# Patient Record
Sex: Female | Born: 1935 | Race: Black or African American | Hispanic: No | Smoking: Former smoker
Health system: Southern US, Community
[De-identification: ages and names within clinical notes are randomized; demographics above are authoritative.]

## PROBLEM LIST (undated history)

## (undated) DIAGNOSIS — F32A Depression, unspecified: Secondary | ICD-10-CM

## (undated) DIAGNOSIS — H4920 Sixth [abducent] nerve palsy, unspecified eye: Secondary | ICD-10-CM

## (undated) DIAGNOSIS — I10 Essential (primary) hypertension: Secondary | ICD-10-CM

## (undated) DIAGNOSIS — G500 Trigeminal neuralgia: Secondary | ICD-10-CM

## (undated) DIAGNOSIS — E119 Type 2 diabetes mellitus without complications: Secondary | ICD-10-CM

## (undated) DIAGNOSIS — I639 Cerebral infarction, unspecified: Secondary | ICD-10-CM

## (undated) DIAGNOSIS — F29 Unspecified psychosis not due to a substance or known physiological condition: Secondary | ICD-10-CM

## (undated) DIAGNOSIS — F329 Major depressive disorder, single episode, unspecified: Secondary | ICD-10-CM

## (undated) DIAGNOSIS — D259 Leiomyoma of uterus, unspecified: Secondary | ICD-10-CM

## (undated) DIAGNOSIS — IMO0002 Reserved for concepts with insufficient information to code with codable children: Secondary | ICD-10-CM

## (undated) HISTORY — PX: TRACHEOSTOMY: SUR1362

## (undated) HISTORY — DX: Leiomyoma of uterus, unspecified: D25.9

## (undated) HISTORY — DX: Reserved for concepts with insufficient information to code with codable children: IMO0002

## (undated) HISTORY — DX: Major depressive disorder, single episode, unspecified: F32.9

## (undated) HISTORY — DX: Type 2 diabetes mellitus without complications: E11.9

## (undated) HISTORY — DX: Essential (primary) hypertension: I10

## (undated) HISTORY — DX: Trigeminal neuralgia: G50.0

## (undated) HISTORY — PX: BRONCHOSCOPY: SUR163

## (undated) HISTORY — DX: Unspecified psychosis not due to a substance or known physiological condition: F29

## (undated) HISTORY — DX: Depression, unspecified: F32.A

## (undated) HISTORY — DX: Sixth (abducent) nerve palsy, unspecified eye: H49.20

---

## 1997-12-29 ENCOUNTER — Encounter: Payer: Self-pay | Admitting: Emergency Medicine

## 1997-12-29 ENCOUNTER — Emergency Department (HOSPITAL_COMMUNITY): Admission: EM | Admit: 1997-12-29 | Discharge: 1997-12-29 | Payer: Self-pay | Admitting: Emergency Medicine

## 1998-01-04 ENCOUNTER — Emergency Department (HOSPITAL_COMMUNITY): Admission: EM | Admit: 1998-01-04 | Discharge: 1998-01-04 | Payer: Self-pay | Admitting: Emergency Medicine

## 1998-01-09 ENCOUNTER — Emergency Department (HOSPITAL_COMMUNITY): Admission: EM | Admit: 1998-01-09 | Discharge: 1998-01-09 | Payer: Self-pay | Admitting: Emergency Medicine

## 1998-01-09 ENCOUNTER — Encounter: Payer: Self-pay | Admitting: Emergency Medicine

## 1998-02-15 ENCOUNTER — Inpatient Hospital Stay (HOSPITAL_COMMUNITY): Admission: AD | Admit: 1998-02-15 | Discharge: 1998-02-17 | Payer: Self-pay | Admitting: Internal Medicine

## 1998-05-16 ENCOUNTER — Ambulatory Visit (HOSPITAL_COMMUNITY): Admission: RE | Admit: 1998-05-16 | Discharge: 1998-05-16 | Payer: Self-pay | Admitting: Internal Medicine

## 1998-07-26 ENCOUNTER — Emergency Department (HOSPITAL_COMMUNITY): Admission: EM | Admit: 1998-07-26 | Discharge: 1998-07-26 | Payer: Self-pay | Admitting: Emergency Medicine

## 1998-07-27 ENCOUNTER — Encounter: Payer: Self-pay | Admitting: Emergency Medicine

## 1998-07-28 ENCOUNTER — Emergency Department (HOSPITAL_COMMUNITY): Admission: EM | Admit: 1998-07-28 | Discharge: 1998-07-28 | Payer: Self-pay | Admitting: Emergency Medicine

## 1998-07-28 ENCOUNTER — Encounter: Payer: Self-pay | Admitting: Emergency Medicine

## 1998-07-29 ENCOUNTER — Emergency Department (HOSPITAL_COMMUNITY): Admission: EM | Admit: 1998-07-29 | Discharge: 1998-07-29 | Payer: Self-pay | Admitting: Emergency Medicine

## 1998-08-01 ENCOUNTER — Inpatient Hospital Stay (HOSPITAL_COMMUNITY): Admission: EM | Admit: 1998-08-01 | Discharge: 1998-08-12 | Payer: Self-pay | Admitting: Emergency Medicine

## 1998-08-02 ENCOUNTER — Encounter: Payer: Self-pay | Admitting: Psychiatry

## 1998-08-14 ENCOUNTER — Inpatient Hospital Stay (HOSPITAL_COMMUNITY): Admission: EM | Admit: 1998-08-14 | Discharge: 1998-09-03 | Payer: Self-pay | Admitting: Emergency Medicine

## 1998-08-15 ENCOUNTER — Other Ambulatory Visit: Admission: RE | Admit: 1998-08-15 | Discharge: 1998-08-15 | Payer: Self-pay | Admitting: *Deleted

## 1998-08-22 ENCOUNTER — Encounter: Payer: Self-pay | Admitting: *Deleted

## 2000-01-14 ENCOUNTER — Encounter: Admission: RE | Admit: 2000-01-14 | Discharge: 2000-01-14 | Payer: Self-pay | Admitting: Internal Medicine

## 2000-01-14 ENCOUNTER — Encounter: Payer: Self-pay | Admitting: Internal Medicine

## 2000-06-05 ENCOUNTER — Encounter: Payer: Self-pay | Admitting: Internal Medicine

## 2000-06-05 ENCOUNTER — Encounter (INDEPENDENT_AMBULATORY_CARE_PROVIDER_SITE_OTHER): Payer: Self-pay | Admitting: *Deleted

## 2000-06-05 ENCOUNTER — Inpatient Hospital Stay (HOSPITAL_COMMUNITY): Admission: AD | Admit: 2000-06-05 | Discharge: 2000-07-02 | Payer: Self-pay | Admitting: Internal Medicine

## 2000-06-06 ENCOUNTER — Encounter: Payer: Self-pay | Admitting: Geriatric Medicine

## 2000-06-07 ENCOUNTER — Encounter: Payer: Self-pay | Admitting: *Deleted

## 2000-06-08 ENCOUNTER — Encounter: Payer: Self-pay | Admitting: Internal Medicine

## 2000-06-09 ENCOUNTER — Encounter: Payer: Self-pay | Admitting: Pulmonary Disease

## 2000-06-09 ENCOUNTER — Encounter: Payer: Self-pay | Admitting: Internal Medicine

## 2000-06-10 ENCOUNTER — Encounter: Payer: Self-pay | Admitting: Internal Medicine

## 2000-06-11 ENCOUNTER — Encounter: Payer: Self-pay | Admitting: Internal Medicine

## 2000-06-12 ENCOUNTER — Encounter: Payer: Self-pay | Admitting: Internal Medicine

## 2000-06-13 ENCOUNTER — Encounter: Payer: Self-pay | Admitting: Internal Medicine

## 2000-06-14 ENCOUNTER — Encounter: Payer: Self-pay | Admitting: Pulmonary Disease

## 2000-06-14 ENCOUNTER — Encounter: Payer: Self-pay | Admitting: Internal Medicine

## 2000-06-15 ENCOUNTER — Encounter: Payer: Self-pay | Admitting: Internal Medicine

## 2000-06-16 ENCOUNTER — Encounter: Payer: Self-pay | Admitting: Internal Medicine

## 2000-06-17 ENCOUNTER — Encounter: Payer: Self-pay | Admitting: Internal Medicine

## 2000-06-22 ENCOUNTER — Encounter: Payer: Self-pay | Admitting: Internal Medicine

## 2000-06-24 ENCOUNTER — Encounter: Payer: Self-pay | Admitting: Internal Medicine

## 2000-06-25 ENCOUNTER — Encounter: Payer: Self-pay | Admitting: Internal Medicine

## 2000-06-26 ENCOUNTER — Encounter: Payer: Self-pay | Admitting: Internal Medicine

## 2000-06-28 ENCOUNTER — Encounter: Payer: Self-pay | Admitting: Internal Medicine

## 2000-06-29 ENCOUNTER — Encounter: Payer: Self-pay | Admitting: Critical Care Medicine

## 2000-06-30 ENCOUNTER — Encounter: Payer: Self-pay | Admitting: Internal Medicine

## 2000-07-02 ENCOUNTER — Inpatient Hospital Stay (HOSPITAL_COMMUNITY)
Admission: RE | Admit: 2000-07-02 | Discharge: 2000-07-04 | Payer: Self-pay | Admitting: Physical Medicine and Rehabilitation

## 2000-07-04 ENCOUNTER — Inpatient Hospital Stay (HOSPITAL_COMMUNITY): Admission: EM | Admit: 2000-07-04 | Discharge: 2000-07-14 | Payer: Self-pay | Admitting: Pulmonary Disease

## 2000-07-04 ENCOUNTER — Encounter: Payer: Self-pay | Admitting: Internal Medicine

## 2000-07-04 ENCOUNTER — Encounter: Payer: Self-pay | Admitting: Physical Medicine and Rehabilitation

## 2000-07-05 ENCOUNTER — Encounter: Payer: Self-pay | Admitting: Pulmonary Disease

## 2000-07-07 ENCOUNTER — Encounter: Payer: Self-pay | Admitting: Pulmonary Disease

## 2000-07-14 ENCOUNTER — Inpatient Hospital Stay (HOSPITAL_COMMUNITY)
Admission: RE | Admit: 2000-07-14 | Discharge: 2000-08-07 | Payer: Self-pay | Admitting: Physical Medicine & Rehabilitation

## 2000-10-06 ENCOUNTER — Encounter
Admission: RE | Admit: 2000-10-06 | Discharge: 2001-01-04 | Payer: Self-pay | Admitting: Physical Medicine & Rehabilitation

## 2001-01-05 ENCOUNTER — Encounter
Admission: RE | Admit: 2001-01-05 | Discharge: 2001-01-22 | Payer: Self-pay | Admitting: Physical Medicine & Rehabilitation

## 2001-01-20 ENCOUNTER — Encounter: Admission: RE | Admit: 2001-01-20 | Discharge: 2001-01-20 | Payer: Self-pay | Admitting: Internal Medicine

## 2001-01-20 ENCOUNTER — Encounter: Payer: Self-pay | Admitting: Internal Medicine

## 2001-04-20 ENCOUNTER — Encounter
Admission: RE | Admit: 2001-04-20 | Discharge: 2001-06-23 | Payer: Self-pay | Admitting: Physical Medicine & Rehabilitation

## 2002-02-08 ENCOUNTER — Encounter: Admission: RE | Admit: 2002-02-08 | Discharge: 2002-02-08 | Payer: Self-pay | Admitting: Internal Medicine

## 2002-02-08 ENCOUNTER — Encounter: Payer: Self-pay | Admitting: Internal Medicine

## 2005-07-30 ENCOUNTER — Encounter: Payer: Self-pay | Admitting: Internal Medicine

## 2005-08-27 ENCOUNTER — Emergency Department (HOSPITAL_COMMUNITY): Admission: EM | Admit: 2005-08-27 | Discharge: 2005-08-28 | Payer: Self-pay | Admitting: Emergency Medicine

## 2008-06-28 ENCOUNTER — Emergency Department (HOSPITAL_COMMUNITY): Admission: EM | Admit: 2008-06-28 | Discharge: 2008-06-28 | Payer: Self-pay | Admitting: Emergency Medicine

## 2010-08-16 NOTE — Consult Note (Signed)
[place]. [facility]  Patient:    Julia Floyd, Julia Floyd                  MRN: [id number] Proc. Date: 07/14/00 Adm. Date:  [id number] Attending:  Faith Rogue T                          Consultation Report  CHIEF COMPLAINT:  Tracheostomy.  HISTORY OF PRESENT ILLNESS:  A 77 year old black female undergoing sequelae after sustaining a spinal cord stroke.  She had a tracheostomy placed maybe greater than one month ago.  Approximately two weeks ago, I was called in for assessment to assist in decannulation.  At that time, she was having some bleeding from the tracheobronchial tree, which was felt to be secondary to suction trauma and resolved spontaneously.  Also at that time, the trachea was clear.  The larynx was functional with mobile vocal cords and good sensation. She had minimal swelling.  She was downsized to a #4 cuffless tracheostomy tube and after several days of observation with the tube plugged continuously and the patient talking, coughing, breathing, and eating funtionally and well, she was decannulated and an occlusive dressing was placed.  She did nicely. Several days thereafter, she apparently sustained some sort of a respiratory event, where she became hypoxic and respiratory therapy was able to reinsert a tracheostomy tube.  The exact nature of the event was uncertain. Subsequently, she has had a tracheostomy tube in place and doing well. Dr. Kevan Ny called me today to request reassistance with consideration for decannulation.  The patient has been moved to the rehab floor and is working on neurological and functional recovery.  She is working well with the tracheostomy tube with a Passy-Muir valve in place.  She is advancing her diet without evident aspiration.  PHYSICAL EXAMINATION:  GENERAL:  She is alert.  NECK:  She has a #4 cuffed tracheostomy tube in her neck with a clean stoma. She is using a Passy-Muir valve successfully.  The neck  is nonswollen.  There is a slight bit of crusting around the stoma underneath the flanges. Inserting the flexible scope through the tracheostomy tube, the tip is unoccluded, in good position, and the trachea down to the level of the carina is clear and patent.  I see no active bleeding at this time.  Following the use of 2% viscous Xylocaine in the nostrils, the flexible laryngoscope was introduced per the nose.  Nasopharynx clear.  Oropharynx clear.  Hypopharynx reveals mobile vocal cords.  No pooling in valleculae or piriforms.  No significant supraglottic, glottic, or subglottic edema.  I was able to visualize down through the cords to the level of the tracheal stoma.  No granulation tissue visible.  The cords were fully mobile.  Good sensation.  I was unable to pass the tracheostomy tube through the cords into the upper trachea.  Finally, removing the tracheostomy tube, the laryngoscope was inserted through the trachea stoma.  The wound is clean and healing.  The scope was retroflexed and the upper trachea was examined from below with a clear passage up to the level of the cords and looking the other way once again clear down to the level of the carina.  At this point, a #4 cuffless Shiley tracheostomy tube was placed without difficulty.  IMPRESSION:  Essentially normal larynx and trachea, both clean and patent.  No evidence of granulation or obstruction.  No evidence of aspiration.  She should have a functional cough, especially with a sealed tracheostomy tube.  I suspect that she will do well, depending on the rest of her functional status, with a gradual course towards downsizing, plugging, and then decannulation.  PLAN:  As above, changed her tube today.  Given that there was some uncertainty as to the exact nature of the prior respiratory distress, I think we should observe several days, probably until after the weekend, before considering decannulation.  We will put a plugged  cannula in now and have a talk with the nurses about leaving the plug in place, except as required for hygiene, pulmonary toilet, or for airway difficulty.  If she should have any of these latter two, this might suggest that she is not a candidate for decannulation but, if she is doing well with no requirements of pulmonary toilet or airway, then I suspect that we will decannulate her successfully. We discussed this with the patient, her husband, and the nurses. DD:  07/14/00 TD:  07/15/00 Job: [id number]

## 2010-08-16 NOTE — Discharge Summary (Signed)
[place]. [facility]  Patient:    Julia Floyd, Julia Floyd                  MRN: [id number] Adm. Date:  [id number] Disc. Date: [id number] Attending:  Faith Rogue T Dictator:   Junie Bame, P.A. CC:         Genene Churn. Love, M.D.  Pearla Dubonnet, M.D.   Discharge Summary  DISCHARGE DIAGNOSES: 1. Status post right brain cerebrovascular accident/cord infarct. 2. Hypertension. 3. Diabetes. 4. Neck pain/neck spasm. 5. Status post acute respiratory failure. 6. Status post tracheostomy. 7. Bipolar disorder. 8. Right sixth cranial nerve palsy. 9. Right trigeminal neuralgia.  HISTORY OF PRESENT ILLNESS:  Ms. Julia Floyd is a 77 year old, right-handed, African-American female with past medical history of hypertension and diabetes mellitus who was originally admitted to rehabilitation on July 02, 2000, due to a right CVA and C2, T2 cord infarct. Due to recurrent respiratory distress, the patient was transferred back to the pulmonary critical care unit on July 04, 2000.  The patient underwent repeat tracheostomy replacement due to upper airway edema and treatment of an E. coli UTI, diuresis with congestive heart failure and nutritional supplements.  At the present time, PT reports she can transfer 2+ total assistance.  Speech dysphagia III diet with nectar thick liquids with full supervision and no aphasia.  The patient presently has tracheostomy.  The patient also has a Panda tube placement scheduled for placement and it was discontinued.  The patient presently had right side paralysis.  ENT would decide whether appropriate to remain with tracheostomy who was Dr. Zoila Shutter.  Primary care Lafaye Mcelmurry is Dr. Marden Noble.  Neurologist is Dr. Sandria Manly.  Pulmonologist is Dr. Sharol Harness.  PAST MEDICAL HISTORY: 1. Bipolar disorder. 2. Hypertension. 3. Diabetes mellitus. 4. Dementia. 5. gastroparesis. 6. Right trigeminal neuralgia. 7. Right sixth cranial nerve  palsy.  SOCIAL HISTORY:  The patient lives with husband, daughter and granddaughter in [place] in a one-level home.  She is independent prior to admission.  She has no alcohol or tobacco abuse.  She has three adult children.  Husband can assist as needed.  ALLERGIES:  CODEINE, TEGRETOL and ULTRAM.  HOSPITAL COURSE:  [name] was admitted to rehabilitation on July 14, 2000, where she received more than three hours of physical therapy, occupational therapy and speech daily (comprehensive rehabilitation).  During the first week of hospital course, [name] had urinary retention, complained about elbow pain, neck spasms and pain and hip pain.  The patient was started on Zanaflex and she used a K-pad to left neck.  Ice pack t.i.d. to left hip as needed for pain.  Zanaflex doses were adjusted as needed during her hospital stay and the neck spasms and hip pain gradually ceased.  The patient was also started on Urecholine for urinary retention which improved and eventually was discontinued.  Ms. Burges was followed for respiratory distress by ENT during her first two weeks of rehabilitation for respiratory failure.  She was eventually decannulated on July 20, 2000, and did not develop any other symptoms of respiratory distress.  She was placed on albuterol p.r.n. as needed. Remaining hospital course was significant for vaginal bleed x 1 day and insomnia.  The patient was taking Lovenox with DVT prophylaxis during most of her stay in rehabilitation, but eventually vaginal bleeding did cease after one day and hemoglobin was within normal limits.  Blood glucose remained fairly stable during most of her stay.  She remained on Lasix  and Cozaar and was on insulin.  Latest labs indicated her hemoglobin was 9.3, hematocrit 28.3, white blood cell count 4.6.  BUN was 14, creatinine 0.7 and sodium 140. At the time of discharge, vital signs were stable.  She still had no range of motion  in the left upper extremity.  She still had significant weakness in the right lower extremity and left lower extremity.  She was moderate assist for bed mobility and maximum assist and was unable to ambulate.  The patient was discharged to home by ambulance.  DISCHARGE MEDICATIONS: 1. Plavix 75 mg one tablet daily. 2. Lasix 20 mg one tablet daily. 3. Neurontin 400 mg one tablet three times daily. 4. Cozaar 50 mg one tablet twice daily. 5. Protonix 40 mg one tablet daily. 6. Humulin N 24 units in the a.m., Humulin N 16 units in the p.m. 7. Zanaflex, follow instructions on prescription, 6 mg. 8. Multivitamin over-the-counter as needed. 9. Oxycodone 5 mg one tablet one tablet four to six hours as needed for pain.  DIET:  No concentrated sweets.  Check CBG before each meal and at night.  ACTIVITY:  She will receive [facility] for physical therapy, occupational therapy, nursing and aide with continued monitoring and education on diabetes.  FOLLOWUP:  She is to follow up with Dr. Riley Kill on September 18, 2000, at 11:30 a.m. Follow up with Dr. Hyacinth Meeker within two weeks.  Follow up with neurology, Dr. Sandria Manly, as needed. DD:  08/07/00 TD:  08/10/00 Job: [id number]

## 2010-08-16 NOTE — Procedures (Signed)
[place]. [facility]  Patient:    Julia Floyd, Julia Floyd                  MRN: [id number] Proc. Date: 06/29/00 Adm. Date:  [id number] Attending:  Pearla Dubonnet CC:         Genene Churn. Love, M.D.  Pearla Dubonnet, M.D.   Procedure Report  PROCEDURE PERFORMED:  Bronchoscopy.  ENDOSCOPIST:  Charlcie Cradle. Delford Field, M.D. [facility]  INDICATIONS FOR PROCEDURE:  Hemoptysis.  ANESTHESIA:  1% Xylocaine local.  PREOP MEDICATION:  Demerol 20 mg IV push.  Versed 2 mg IV push.  DESCRIPTION OF PROCEDURE:  The Olympus video bronchoscope was introduced in the tracheotomy tube.  The airways were visualized and showed a exophytic granulation tissue seen in the posterior membranous wall of the trachea just above the carina.  A small blood clot was seen over this.  This was removed and suctioned free.  Active oozing was seen then once the clot was removed. No other active bleeding was seen throughout the remaining portions of the tracheobronchial tree.  Old aspirated blood was seen in the right lower lobe and left lower lobe.  This was suctioned free.  No other endobronchial lesions were identified.  Cytology brushings from the tracheal wall was obtained. Bronchial washings were obtained.  COMPLICATIONS:  None.  IMPRESSION:  Posterior membranous tracheal wall exophytic lesion rule out malignancy, versus tracheo-esophageal fistula versus irritation from suctioning with subsequent granulation tissue.  RECOMMENDATIONS: 1. Switch to red rubber catheter suctioning.  Do not deep suction unless    necessary. 2. Discontinue Plavix and follow-up cytology data. 3. Obtain CT scan of the chest for further evaluation of the mediastinum. DD:  06/29/00 TD:  06/29/00 Job: [id number]

## 2010-08-16 NOTE — Op Note (Signed)
[place]. [facility]  Patient:    Julia Floyd, Julia Floyd                  MRN: [id number] Proc. Date: 06/16/00 Adm. Date:  [id number] Attending:  Pearla Dubonnet             Critical care medical service  Pearla Dubonnet, M.D.  Genene Churn. Love, M.D.   Operative Report  PREOPERATIVE DIAGNOSIS: 1. Respiratory insufficiency. 2. Cerebrovascular accident, brain and brain stem. 3. Rule out vocal cord paralysis.  POSTOPERATIVE DIAGNOSIS: 1. Respiratory insufficiency. 2. Intubation injury to the larynx, mild--moderate.  OPERATION PERFORMED:  Tracheostomy, direct laryngoscopy.  SURGEON:  Gloris Manchester. Lazarus Salines, M.D.  ANESTHESIA:  General orotracheal converted to general endotracheal stomal.  ESTIMATED BLOOD LOSS:  Minimal.  COMPLICATIONS:  None.  FINDINGS:  A fatty lower neck with a very prominent right internal jugular vein, pretracheal fat but no identified thyroid isthmus.  A relatively small trachea.  After completing tracheostomy, direct laryncoscopy was performed. The patient has a posterior vocal cord eschar/granuloma on both cords, consistent with intubation injury.  Both cords, true and false are swollen. No posterior commissure stenosis.  Both cords seemed fully mobile although the left seems to have better mobility than the right on operating room examination, somewhat limited by the secretions and edema.  DESCRIPTION OF PROCEDURE:  With the patient in a comfortable supine position, general anesthesia was administered per indwelling orotracheal tube.  At an appropriate level, the patient was placed in a slight reverse Trendelenburg. Shoulders rolled and the neck extended.  The lower neck was palpated with the findings as described above.  1% Xylocaine with 1:100,000 epinephrine, 10 cc total was infiltrated into the surgical field for intraoperative hemostasis. Several minutes were allowed for this to take effect.  Sterile preparation  and draping of the low neck was accomplished.  A 5 cm transverse incision was made halfway between the palpable cricoid cartilage and sternal notch and carried down through the skin and subcutaneous fat.  A prominent anterior jugular branch on the right side was identified approximately 1 cm in diameter, isolated, divided and ligated.  Additional fat was divided including the superficial layer of the deep cervical fascia.  The midline raphe of the strap muscles was divided in two layers.  Additional pretracheal fat was divided with the cautery.  Thyroid isthmus was not seen. By palpation, transverse incision was made into the trachea at the third and fourth interspace approximately 1 cm wide.  An inferiorly based flap was elevated through the cartilage one ring.  This was secured to the lower edge of the wound with a 2-0 chromic stitch.  The mucosal edges were cauterized for hemostasis.  At this point a previously tested #6 Shiley tracheostomy tube was brought into the field.  The orotracheal tube was gently backed out and under direct vision, the #6 Shiley was placed and inserted to its full depth. Ventilation was assumed per tracheostomy tube.  The cuff was inflated and observed to be intact and containing air.  A trach dressing was applied. Hemostasis was observed.  The tracheostomy tube was secured with the cotton ties in the standard fashion.  After completing the tracheostomy and with ventilation per tracheostomy tube, the table was turned slightly.  A moist 4 x 4 was used to protect the upper gum.  The oral cavity was inspected with the headlight and tongue blade with no significant findings except that she was completely edentulous.  Secretions  were suctioned free.  The Hollinger laryngoscope was introduced and the hypopharynx appeared normal.  The endolarynx was somewhat edematous, relatively small.  There was soft eschar on the posterior vocal cord on both sides and a small  amount of the posterior commissure consistent with intubation injury.  These were not circumferential, nor was there any obvious stenosis.  Deep to this there were some pooled secretions which were suctioned free.  With the patient awakening and breathing spontaneously, vocal cord motion was observed on both sides.  The left cord seemed more fully mobile than the right but this could have been artifact of the level of anesthesia or of the endolaryngeal edema.  No strong evidence here of a true vocal cord paralysis.  The laryngoscope was removed as was the gum guard.  At this point the procedure was completed and the patient was returned to anesthesia, awakened and transferred back to the neurosurgical intensive care unit in stable conditin.  COMMENT:  A 77 year old black female is status post a brain and spinal cord stroke in the past couple of weeks with a failed extubation trial secondary to stridor and a question of possible laryngeal neurologic injury was the indication for the several ____________ of todays procedure.  Anticipate a routine postoperative recovery with attention to routine trach care.  Would recommend proton pump inhibitors to protect the larynx, especially in the presence of a nasogastric tube.  The patient may be a candidate for a cuffless tracheostomy tube but will need to decide on the status of her larynx for considering the full extubation.  She probably also will require a swallowing evaluation for speech pathology to decide if and when she can begin/resume p.o. feeding. DD:  06/16/00 TD:  06/16/00 Job: [id number]

## 2010-08-16 NOTE — Consult Note (Signed)
[place]. [facility]  Patient:    Julia Floyd, Julia Floyd                  MRN: [id number] Proc. Date: 06/05/00 Adm. Date:  [id number] Attending:  Pearla Dubonnet                          Consultation Report  DATE OF BIRTH:  05-07-1930  REASON FOR CONSULTATION:  This is one of [facility] admissions for this 77 year old, right-handed, black, married female admitted from Dr. Robley Fries office for evaluation of progressive left-sided weakness.  HISTORY OF PRESENT ILLNESS:  Julia Floyd has a known history of diabetes mellitus and hypertension, but no known history of stroke.  She awoke on May 31, 2000 with left-sided numbness.  She felt gradually worse on June 01, 2000 and Tuesday, June 03, 2000 was seen by Dr. Robley Fries in his office. At that time a CT scan of the brain was unremarkable.  She noted some headache Tuesday and progressive difficulty using her left hand and arm.  There were no right-sided symptoms, but she did complain of headache.  She has no known his. of stroke.  She has had a long history of increased tone possibly related to the use of psychotropic medications to treat bipolar disorder.  She has had a dementia which was evaluated with MRI study of the brain and spinal tap in May 2000.  She has been on Zyprexa 2.5 mg h.s., Celexa 10 mg per day, ___________ 2.5/500 b.i.d., Zestril 10 mg a day, Lasix 20 mg q.d., Plavix 75 mg q.d. x 4 days, aspirin 325 mg q.d., Benadryl 25-50 mg p.o. p.m., and Actos 45 mg per day.  She has no history of alcohol or drug use, and does not smoke cigarettes.  She has no history of recent head or neck trauma.  PHYSICAL EXAMINATION:  GENERAL:  Well-developed, black female.  VITAL SIGNS:  Blood pressure in right arm 180/80, left arm 180/80, heart rate 64 and regular.  NEUROLOGIC:  There was a left supraclavicular bruit.  She was alert, had a flat affect.  She was oriented to hear and month.   She knew the president but not the vice president.  She knew the day of the week and the date.  Cranial nerve examination revealed visual fields to be full.  The extraocular movements were full.  She had a flat affect.  She had a positive Mayerson sign.  She had absent left shoulder shrug.  She had 0-5 in the left arm and 1 to 2/5 in the left leg.  She had increased tone in both upper and lower extremities.  Plantar responses were bilaterally upgoing.  Sensory examination revealed decreased vibration in the left arm.  She had decreased vibratory sensation in her left foot and her left hand.  She had decreased pinprick on her left arm, greater than the left leg.  Deep tendon reflexes were 3+ and plantar responses were bilaterally upgoing.  She had increased tone in the upper and lower extremities.  LABORATORY:  MRI study showed new, or at least since May 2000, right pontine ischemic stroke which did not show on a DWI.  The MRA showed absence of the left vertebral and intracranial carotid disease bilaterally.  Doppler study of the carotids was unremarkable and vertebral flow was antegrade.  A 2-D echocardiogram report is pending.  IMPRESSION:  1. Right brain stroke.  Suspect small-vessel disease probably in the pons.     Code 434.01  2. Extrapyramidal disease secondary to medications.  Code 332.1.  3. Bipolar disorder.  Code 396.80  4. Hypertension.  Code 796.2  5. Diabetes mellitus.  Code 250.67  PLAN:  Plan at this time is to continue on aspirin and Plavix and obtain a rehab consult, an EEG, homocystine level, lipid profile. DD:  06/05/00 TD:  06/07/00 Job: [id number]

## 2010-08-16 NOTE — Discharge Summary (Signed)
[place]. [facility]  Patient:    Julia Floyd, Julia Floyd                  MRN: [id number] Adm. Date:  [id number] Disc. Date: 07/04/00 Attending:  Merwyn Katos Dictator:   Junie Bame, P.A.                           Discharge Summary  DISCHARGE DIAGNOSES: 1. Respiratory distress. 2. Cord infarct. 3. Anemia.  HISTORY OF PRESENT ILLNESS:  The patient is a 77 year old right-handed black female with past medical history of hypertension and diabetes mellitus 2.  Was admitted on May 31, 2000, for evaluation of left lower extremity weakness. Head CT was negative for acute events, MRI of the head on June 05, 2000, demonstrated a small infarct in the right aspect of the pons and showed progressing CVA.  The patient was placed on ______ and aspirin.  HOSPITAL COURSE:  Most significant for ______ VDRF, eventually requiring tracheostomy on June 16, 2000, second CVA (which was CT C2 T10 cord infarct on June 09, 2000), anemia, and left lower lobe pneumonia, infiltrate and hemoptysis.  The patient underwent bronchoscopy to evaluate hemoptysis and pathology report was negative.  The patient underwent tracheal decannulation today.  Presently, she had left hemiparesis and left hemiplegia.  PT reported at the time the patient was transferred from bed to chair to left squat pivot, total x 2 (patient approximately 15%).  She is sitting out of the bed for approximately 25 minutes with minimum to maximum assist.  Speech indicated she has dysphagia 3 with nectar-thick liquids, but no aphasia.  The patient is presently on Plavix only.  Her primary care physician is Dr. Marden Noble and neurologist is Dr. Sandria Manly, pulmonologist is Dr. Sung Amabile, and critical care managed treatment was Dr. Julian Reil.  PAST MEDICAL HISTORY:  Significant for bipolar disorder, hypertension, diabetes type 2, dementia, gastroparesis, right trigeminal neuralgia, right sixth cranial nerve palsy.  PAST  SURGICAL HISTORY:  None.  SOCIAL HISTORY:  The patient lives with husband and granddaughter ______ one-level home.  She was independent prior to admission, no alcohol or tobacco use.  She has three adult children.  HOSPITAL COURSE:  The patient was admitted to the rehabilitation unit at [facility] on July 02, 2000, for comprehensive patient rehabilitation where she received three hours of physical therapy, occupational therapy and speech therapy at least three hours a day.  The patient did fairly well during the first three days of rehabilitation until April 6, where she developed dyspnea and extreme hypertension, which eventually led to code status.  The patient was immediately transferred to 2104 with ______.  DISCHARGE MEDICATIONS: 1. Prevacid 30 mg p.o. q.d. 2. Cozaar 50 mg p.o. b.i.d. 3. Isordil 10 mg t.i.d. 4. Albuterol nebs q.6h. p.r.n. 5. Atrovent nebs q.6h. as needed. 6. Heparin ______ units ______ every 12 hours. 7. NPH 24 units in the a.m., 14 units in the p.m. 8. Multivitamin p.o. q.d. 9. Plavix 75 mg p.o. q.d. DD:  07/06/00 TD:  07/06/00 Job: [id number]

## 2010-08-16 NOTE — H&P (Signed)
[place]. [facility]  Patient:    Julia Floyd, Julia Floyd                 MRN: [id number] Adm. Date:  06/05/00 Attending:  Pearla Dubonnet, M.D. CC:         Genene Churn. Love, M.D.             Oley Balm Sung Amabile, M.D. [facility]                         History and Physical  CHIEF COMPLAINT: "I cant move my left arm now."  HISTORY OF PRESENT ILLNESS: This patient is a 77 year old female with a history of:  1. Hypertension.  2. Type 2 diabetes mellitus.  3. Right sixth cranial nerve palsy, history of.  4. Paranoid depression, admitted to [facility]. [facility] in 2000.  5. Diabetic gastroparesis, November 1999, [place]. [facility].  6. Question of right trigeminal neuralgia in the past.  7. Lumbar degenerative joint disease.  Ms. Cieslewicz presents with symptoms to suggest progressive right brain CVA. Five days prior to admission she awoke with inability to move her left lower extremity well.  Her left upper extremity movement was normal and speech was within normal limits.  The following morning she contacted [place] at [facility], Patsi Sears, and was seen in the office and did indeed have some left lower extremity weakness.  She is chronically on aspirin therapy and Plavix was added to this.  She had a noncontrast head CT obtained which was within normal limits.  Carotid Dopplers were scheduled and an MRI was scheduled as well.  She did well for several days until the morning of admission when she noted inability to move her left upper extremity.  She was brought to [facility] and admitted for probable progressive CVA with possible need for anticoagulation.  Neurology consultation was provided by Dr. Avie Echevaria.  MRI/ MRA was ordered and revealed right pontine changes.  CURRENT MEDICATIONS:  1. Zyprexa 2.5 mg p.o. q.h.s.  2. Celexa 10 mg p.o. q.d.  3. Glucovance 2.5/500 mg b.i.d.  4. Zestril 10 mg q.d.  5. Lasix 20  mg q.d. p.r.n.  6. Plavix 75 mg q.d. - had been on for four days.  7. Aspirin 325 mg q.d.  8. Benadryl 25-50 mg p.o. p.r.n. "stiffness" from Zyprexa.  9. Actos 45 mg q.d.  FAMILY HISTORY: Positive for myocardial infarction and diabetes mellitus.  SOCIAL HISTORY: No tobacco or alcohol use for 40 years.  She is married.  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  She has a very supportive husband.  REVIEW OF SYSTEMS: She denies any choking on food and seems to eat well.  No double vision.  No dysarthria.  She has had some incoordination of the right upper extremity as well.  No fever, no rash.  She felt short of breath the day of admission.  PHYSICAL EXAMINATION:  GENERAL: She is alert and oriented and conversant.  VITAL SIGNS: Temperature 96.5 degrees, pulse 80 and regular, blood pressure 142/70.  HEENT: Head normocephalic, atraumatic.  Cranial nerves intact grossly.  NECK: Without bruits bilaterally, carotids 2+ bilaterally.  No JVD.  CHEST: Clear to auscultation.  CARDIAC: Regular rate and rhythm without murmurs, rubs, or gallops.  ABDOMEN: Soft, nontender.  Normal bowel sounds.  No obvious organomegaly.  No rebound.  EXTREMITIES: Warm throughout, no deformity.  NEUROLOGIC: She is oriented  x 3.  Cranial nerves intact.  Positive gag reflex. Left upper extremity with 1/5 strength and feels stiff.  Right upper extremity with incoordination and stiffness.  Right lower extremity 5/5 strength with normal movement and left lower extremity 1-2/6 strength with toes upgoing bilaterally.  LABORATORY DATA: MRI/MRA of the brain was most significant for small acute change in the right pons.  A 2D echocardiogram is pending.  Carotid Dopplers are pending.  CBC, sedimentation rate, PT, PTT, CMET, CPK, EKG, and inspiratory and expiratory chest x-ray are pending.  ASSESSMENT: This patient is a 77 year old female with progressive cerebrovascular accident, with a normal head CT three  days prior to admission, and question of a probable small pontine stroke.  PLAN:  1. Admit.  2. Neurology service consultation.  3. Question further anticoagulation therapy with heparin. DD:  06/08/00 TD:  06/09/00 Job: [id number]

## 2010-08-16 NOTE — Discharge Summary (Signed)
Julia Floyd. [facility]  Patient:    Julia Floyd, Julia Floyd                  MRN: [id number] Adm. Date:  [id number] Disc. Date: [id number] Attending:  Faith Rogue T CC:         Genene Churn. Love, M.D.  Oley Balm Sung Amabile, M.D. [facility]  Karol T. Lazarus Salines, M.D.   Discharge Summary  DATE OF BIRTH:  1931-03-16  DISCHARGE DIAGNOSES:  1. C2 to T2 acute cervical cord infarct.  2. Left lower lobe pneumonia, resolved.  3. Respiratory failure secondary to #1 with prolonged intubation requiring     tracheostomy.  4. Escherichia Coli urinary tract infection.  5. Severe left upper extremity and left lower extremity pain secondary to     cord infarct, much improved on Baclofen and Neurontin.  6. Tracheal bleeding secondary to probable traumatic injury from either     suctioning or endotracheal tube cupping while intubated, resolved.  7. Hypertension.  8. Type 2 diabetes mellitus.  9. History of right sixth cranial nerve palsy. 10. History of paranoid depression. 11. History of diabetic gastroparesis. 12. History of probable right trigeminal neuralgia. 13. History of lumbar degenerative joint disease. 14. Sigmoid diverticulosis. 15. Uterine fibroid. 16. Probable cystic right ovary.  DISCHARGE MEDICATIONS:  1. Plavix 75 mg q.d.  2. Neurontin 400 mg t.i.d.  3. Isordil 10 t.i.d.  4. Cozaar 50 mg b.i.d.  5. Prevacid 30 mg q.d.  6. Duragesic Patch 25 mcg/hr q.12h.  7. Lioresal 10 mg t.i.d.  8. Multivitamin with minerals q.d.  CONSULTATIONS:  1. Neurology, Dr. Avie Echevaria.  2. Critical Care, Dr. Billy Fischer.  3. ENT, Dr. Zola Button T. Wolicki.  PROCEDURES:  1. Carotid artery dopplers, June 05, 2000:  No ICA stenosis with bilateral     vertebral artery flow being antegrade.  2. Lower extremities venous dopplers performed June 17, 2000:  No DVT.     Bakers cyst involving right knee, 2.4 x 3.5 cm.  3. 2-D cardiac echo, June 05, 2000:  Poor study at bedside secondary  to     limited ability for the patient to move properly.  LA at upper limits of     normal.  RV could not be seen.  LV was poorly seen, but there was a     suggestion of normal left ventricular function.  4. MRA of brain, June 05, 2000:     1. Remote right pons infarct.     2. Tiny left thalamus infarct, likely acute.     3. MR angiogram of the circle of Willis revealed narrowing and        irregularities involving portions of the internal carotid artery,        anterior cerebral artery, and the middle cerebral arteries bilaterally.        No evidence of aneurysm.  There appeared to be occlusion of left        vertebral artery, however, carotid dopplers did not reveal this.  5. Repeat head CT, June 07, 2000:  No intracranial hemorrhage or cortical     infarct.  6. MRI of C-spine and MRI of brain without contrast, June 09, 2000:  MRI of     brain revealed two punctate foci of diffusion positivity in the left     thalamus consistent with infarcts less than two weeks old.  7. MRI of the cervical spine:  Extensive cord infarct from C2 to T2, acute.  8. Swallowing study completed June 25, 2000:  Flash penetration noted with     nectur-thick, honey-thick, and thin barium.  Chin tuck maneuvers prevented     penetration with honey-thick liquids.  9. Abdominal and pelvic CT, June 30, 2000:  Revealed sigmoid diverticulosis,     uterine fibroid, and right adnexal cystic mass.  There was minimal rectal     fullness on lower cups of the rectum, raising the question of a rectal     lesions. 10. Chest CT, June 29, 2000:  Borderline changes in lymphadenopathy of the     mediastinum, bilateral lower lobe pulmonary opacities, possibly a     combination of infiltrate and atelectasis, right pleural effusion, and     tracheostomy noted. 11. Tracheostomy, performed by Dr. Zola Button T. Wolicki, June 16, 2000. 12. Endoscopic ENT evaluation, Dr. Lazarus Salines, June 28, 2000:  Fresh blood noted     in the trachea  felt to be possibly coming from left upper lobe bronchus. 13. Bronchoscopy performed June 29, 2000, Dr. Shelle Iron:  An exophytic lesion was     seen on the posterior membranous portion of the tracheal wall with clot     and active bleeding after clot was removed.  Felt to be probable     granulation tissue and subsequent biopsy revealed benign reparative     changes.  HOSPITAL COURSE:  Julia Floyd is a very pleasant 77 year old female who presented to the [facility] June 05, 2000, complaining of inability to move her left arm.  Approximately five days prior to admission she woke up with inability to move the left lower extremity well.  Left upper extremity was okay.  Speech was fine.  Four days prior to admission presented to [facility] at [facility] and had left lower extremity weakness.  Plavix was added to her aspirin and carotid dopplers and head CT ordered.  Head CT revealed no acute CVA.  The patient did well actually until the morning of admission when she was noted to again have inability to move her left upper extremity.  She was admitted for progressive CVA with possible need for anticoagulation.  She was seen in consultation by Dr. Melbourne Abts who felt she likely had a right brain stroke, suspected small-vessel disease in the pons.  She actually continued to do well over the next several days and then was noted to be somnolent by morning of June 08, 2000, and was found to have a severe respiratory acidosis and hypoxic.  She was unarousable.  She was intubated and transferred to the intensive care unit.  Subsequently she was found to have a lower extremity cord infarct and was ventilator dependent for a prolonged period of; this necessitated the formation of a tracheostomy on June 16, 2000.  Prior to June 16, 2000, she had been extubated and then had to be reintubated because of inadequate respiratory drive secondary to her cord  infarct.  Chest  x-rays revealed elevated left hemidiaphragm.  She was treated for pneumonia during this time with IV Zosyn.  After a very slow wean, she was able to be off the ventilator completely by June 19, 2000, with tracheostomy in place.  On March 25 and June 23, 2000, she was noted to have blood tinged sputum and this continued to be a problem, and ENT was reconsulted to assess her airway and fresh blood was noted in trachea and felt to be perhaps coming from the left upper lobe.  On June 28, 2000, her platelets were held, and bronchoscopy revealed exophytic lesion as mentioned above and with stopping platelets she did not rebleed.  Plavix was resumed later.  Also subsequent to her stroke she developed severe left upper extremity and left lower extremity pain, initially felt to perhaps be ischemic coronary disease, but when her left lower extremity started to hurt as well and to feel crampy, Neurontin and Baclofen were instituted with resolution of pain.  During her initial hospitalization she was treated with heparin and then Plavix and aspirin.  Excellent treatment support was provided by pulmonary critical care, neurology, and otolaryngology consultation.  By July 02, 2000, she was stable enough to transfer to rehab for further therapy.  Issues at rehab were to further address trachea care with ultimately plugging her tracheostomy being planned.  Plavix was readded at time of transfer and continued tube feedings with subsequent speech therapy evaluations to assess ability to take p.o. medications. DD:  09/24/00 TD:  09/24/00 Job: [id number]

## 2010-08-16 NOTE — Discharge Summary (Signed)
[place]. [facility]  Patient:    Julia Floyd, Julia Floyd                  MRN: [id number] Adm. Date:  [id number] Disc. Date: [id number] Attending:  Faith Rogue T                           Discharge Summary  NO DICTATION DD:  09/25/00 TD:  09/25/00 Job: [phone] [id number]

## 2010-08-16 NOTE — Consult Note (Signed)
[place]. [facility]  Patient:    Julia Floyd, Julia Floyd                  MRN: [id number] Proc. Date: 06/15/00 Adm. Date:  [id number] Attending:  Pearla Dubonnet CC:         Pearla Dubonnet, M.D.  Genene Churn. Love, M.D.   Consultation Report  CHIEF COMPLAINT:  Airway difficulty.  HISTORY OF PRESENT ILLNESS:  A 77 year old black female with diabetes and hypertension, admitted to the hospital on March 8, after a three-day progressive somatic stroke.  While in the hospital, she suffered a presumed embolic spinal cord stroke.  Neurologic studies have revealed a stroke of the pons and of the spinal cord from C2 all the way to T2.  No true medullary involvements recorded in the chart.  The patient was intubated at the time of her deterioration on March 11 and has remained so for one week.  She has good ventilatory effort, good mental status, and significant left hemiparesis.  She underwent an extubation trial yesterday and failed fairly promptly on account of upper airway obstruction, but not respiratory effort.  ENT was consulted for evaluation of airway and probable tracheostomy.  PHYSICAL EXAMINATION:  GENERAL:  This is a slightly overweight, alert, middle-aged black female with a right nasogastric tube in place and an orotracheal tube in place.  She seems to respond to conversation, but of course cannot speak and motions only crudely with her right hand.  She cannot move her left hand.  NECK:  Lower neck shows normal anatomy.  IMPRESSION:  Respiratory failure secondary to upper airway obstruction, question bilateral vocal cord paralysis versus laryngeal edema.  PLAN:  She is on schedule for tracheostomy and direct laryngoscopy for assessment of vocal cord mobility tomorrow morning.  This will of course make a difference in terms of what her rehabilitation potention is depending on how much cranial nerve involvement she might have.  I believe she  understands and consents to the procedure, but will have the nurses talk with her husband and I will be glad to talk with him as well as desired. DD:  06/15/00 TD:  06/16/00 Job: [id number]

## 2010-08-16 NOTE — Discharge Summary (Signed)
[place]. [facility]  Patient:    Julia Floyd, Julia Floyd                 MRN: [id number] Adm. Date:  08/14/98 Disc. Date: 09/03/98 Attending:  Dorina Hoyer, M.D. CC:         Dr. [facility]                           Discharge Summary  IDENTIFYING INFORMATION:  Julia Floyd is a 77 year old married African-American female who has only recently begun having psychiatric difficulties.  She had the acute onset of mood and behavior symptoms, her only prodrome was increased somatization over the previous six months or so.  She has undergone a full neurological workup that has yielded only mild vascular disease of the brain. hen her behavior became grossly psychotic, she was readmitted as discussed in the HPI. On admission, she was completely uncooperative with the interview process, paranoid prior to admission, and generally incoherent on admission.  See the typed admission note, as mentioned, for full details.  HOSPITAL COURSE:  The patient was admitted under 15-minute precautions.  We monitored her for any dangerousness.  We used neuroleptics to stabilize her condition.  She required p.r.n. Ativan at times, p.r.n. Haldol at times, Prolixin was initially tried, as well as standing Ativan, which gradually improved her condition.  We further added Seroquel because she needed more antipsychotic medication, but going higher on her Prolixin only proved to be too sedating and  caused some unsteadiness.  In general, her course was complicated by the fact that the doses of neuroleptics required to keep her stable and nondangerous and nonpsychotic caused some unsteadiness, and she was noted to be a fall risk, and  required a Geri-chair and a roll belt as ordered on her straight orders through the chart.  But, as mentioned, any attempt to lower her antipsychotics resulted in general dangerousness.  Dr. Kevan Ny was kind enough to come by  and follow her with his usual excellent care.  He helped adjust her type 2 diabetes.  We used a sliding scale of insulin to further control her glucose.  Further, since there was the concern of dementia in this patient, she was continued on her Aricept.  By May , 2000, she was as stable as she had been.  She was alert and oriented to person,  place, and overall situation.  She was not sure of the exact date, and still would guess a date, but this seemed to be pretty much baseline since I have known her. She had no auditory or visual hallucinations, was not paranoid, was able to contract for safety with me.  By September 03, 1998, I thought she was stable enough o be discharged to an outpatient level of care.  FOLLOW-UP PLANS:  The patient will be followed by home health [facility] care). She will see Dr. Kevan Ny in one weeks time.  She is to call for an appointment, or her husband will.  She is going to see me in two to three weeks time based on availability of appointments.  DISCHARGE INSTRUCTIONS:  She knows to call if she runs into any further trouble.  DISCHARGE MEDICATIONS:  The patient will continue on: 1. Trilafon 4 mg at bedtime, this was switched on August 31, 1998, from Prolixin to    reduce the chance of sedation.  2. Seroquel 12.5 mg a day, 25 mg at bedtime. 3. Glucophage 500 mg with breakfast and supper. 4. Glucotrol XL 20 mg a day. 5. Cogentin 1 mg b.i.d. p.r.n. for muscle stiffness. 6. Aricept 5 mg at bedtime. 7. Celexa 10 mg at bedtime. 8. Vitamin E 3 400 IU tablets a day.  LABORATORY DATA:  See printout for full list of sugars.  Urinalysis essentially  normal.  The patient did have a low grade temperature at times.  We felt this was due to neuroleptic therapy, and Dr. Kevan Ny is aware.  Glucose in her CSF was 131, otherwise CSF analysis was unremarkable.  VDRL was nonreactive.  FINAL DIAGNOSES:   AXIS I.  1. 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             2. 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            3. Bipolar disorder, most likely type 3, this episode being manic.  AXIS II.  None. AXIS III.  1. Type 2 diabetes.            2. Some unsteadiness on medications, but the patient was warned               extensively about how to get up and use caution.  Home health will be               made aware, as well, that the patient is a fall risk.  But, as               mentioned, it is very risky to stop her antipsychotics due to the               demonstrated dangerousness and paranoia she has without them. DD:  09/03/98 TD:  09/04/98 Job: [id number]

## 2010-08-16 NOTE — Discharge Summary (Signed)
[place]. [facility]  Patient:    Julia Floyd, Julia Floyd                  MRN: [id number] Adm. Date:  [id number] Disc. Date: [id number] Attending:  Faith Rogue T                           Discharge Summary  DISCHARGE DIAGNOSES: 1. Recurrent respiratory failure likely secondary to upper airway edema,    resolved. 2. Left hemiplegia secondary to C2-T2 cord infarct. 3. Tracheostomy. 4. Hypertension. 5. Diabetes mellitus. 6. Enterococcal urinary tract infection. 7. Question of gastroesophageal reflux disease.  DISCHARGE MEDICATIONS ON TRANSFER:  1. Humulin 70/30 24 units subcu b.i.d.  2. Baclofen 20 mg t.i.d.  3. Neurontin 400 mg t.i.d.  4. Cozaar 50 mg b.i.d.  5. Isordil 10 mg t.i.d.  6. Duragesic patch 25 mcg per hour, changed topically every 72 hours.  7. Lioresal 10 mg t.i.d.  8. Plavix 75 mg daily.  9. Lasix 20 mg orally daily. 10. Senokot tablets two p.o. q.h.s. 11. Darvocet-N 100 one to two p.o. q.4-6h. p.r.n. pain.  CONSULTATIONS: 1. Critical care medicine. 2. Neurology, Dr. Thad Ranger. 3. ENT, Dr. Pollyann Kennedy.  PROCEDURES: 1. Endoscopic ears, nose and throat evaluation of the upper airway that    revealed both supraglottic laryngeal and infraglottic airway edema    performed on July 04, 2000, per Dr. Allegra Grana.  Vocal cords were not    paralyzed. 2. Swallowing study on July 07, 2000, revealed delay of all consistencies of    barium, penetration of thin liquid which was protected by chin tuck. 3. Non-contrast head computed tomography with no acute changes on July 05, 2000.  HOSPITAL COURSE:  The patient was transferred on July 04, 2000, to critical care medicine from inpatient rehabilitation from [facility] because of respiratory arrest.  She had recently had her tracheostomy decannulated two days prior.  She was found apneic and unresponsive on July 04, 2000, and the patient was artificially ventilated with the mask until  respiratory could replace a tracheostomy and then mechanical ventilation was resumed.  She was transferred to 2100 and seen in consultation by Dr. Sung Amabile of pulmonary critical care who asked to Dr. Allegra Grana to perform an endoscopic evaluation to the airway as above.  She quickly improved with ventilation and by the second day of admission to the ICU, she was on a 28% tracheostomy collar oxygenating at 100%.  Head CT showed no evidence of any acute intracranial abnormality.  She continued to be hemiplegic on the left from her cord infarct.  She continued to improve in all aspects of her condition over the next several days.  On July 07, 2000, she did spike a low-grade temperature to 99.8 and was found to have an Enterococcal UTI and treated with Levaquin.  She did have some mild decrease in oxygenation thought to be secondary to fluid overload and IV and oral Lasix was started with resolution of hypoxia.  In terms of her diabetes, her blood sugars were well-controlled with sliding scale insulin and he was ultimately discharged to rehabilitation on Humulin 70/30 in b.i.d. dosing.  In terms of her nutrition, speech therapy was very helpful in following sequential swallowing assessments and by the time of discharge to rehabilitation, the patient was on a dysphagia III, nectar thick diet with full supervision and tube feeds had been discontinued.  Blood pressure was  well-controlled during this transfer back to the internal medicine service.  By July 14, 2000, she was stable to be transferred back to the rehabilitation on the above medications.  ENT was to follow for tracheostomy care.  She did continue to experience left extremity pain and Baclofen was increased to 20 mg t.i.d. along with her Neurontin therapy with good control of pain. DD:  09/25/00 TD:  09/25/00 Job: [id number]

## 2012-03-19 ENCOUNTER — Ambulatory Visit: Payer: Medicare Other | Attending: Internal Medicine | Admitting: Physical Therapy

## 2012-03-19 DIAGNOSIS — R262 Difficulty in walking, not elsewhere classified: Secondary | ICD-10-CM | POA: Insufficient documentation

## 2012-03-19 DIAGNOSIS — I69998 Other sequelae following unspecified cerebrovascular disease: Secondary | ICD-10-CM | POA: Insufficient documentation

## 2012-03-19 DIAGNOSIS — IMO0001 Reserved for inherently not codable concepts without codable children: Secondary | ICD-10-CM | POA: Insufficient documentation

## 2012-07-27 ENCOUNTER — Other Ambulatory Visit: Payer: Self-pay | Admitting: Orthopedic Surgery

## 2012-07-27 DIAGNOSIS — M545 Low back pain: Secondary | ICD-10-CM

## 2012-08-04 ENCOUNTER — Inpatient Hospital Stay: Admission: RE | Admit: 2012-08-04 | Payer: PRIVATE HEALTH INSURANCE | Source: Ambulatory Visit

## 2012-08-09 ENCOUNTER — Ambulatory Visit
Admission: RE | Admit: 2012-08-09 | Discharge: 2012-08-09 | Disposition: A | Discharge status: Home / Self Care | Payer: Medicare Other | Source: Ambulatory Visit | Attending: Orthopedic Surgery | Admitting: Orthopedic Surgery

## 2012-08-09 DIAGNOSIS — M545 Low back pain: Secondary | ICD-10-CM

## 2013-10-26 ENCOUNTER — Encounter: Payer: Self-pay | Admitting: *Deleted

## 2013-10-26 ENCOUNTER — Encounter: Payer: Self-pay | Admitting: Interventional Cardiology

## 2013-10-26 DIAGNOSIS — H4920 Sixth [abducent] nerve palsy, unspecified eye: Secondary | ICD-10-CM | POA: Insufficient documentation

## 2013-10-26 DIAGNOSIS — F329 Major depressive disorder, single episode, unspecified: Secondary | ICD-10-CM | POA: Insufficient documentation

## 2013-10-26 DIAGNOSIS — I10 Essential (primary) hypertension: Secondary | ICD-10-CM | POA: Insufficient documentation

## 2013-10-26 DIAGNOSIS — E119 Type 2 diabetes mellitus without complications: Secondary | ICD-10-CM | POA: Insufficient documentation

## 2013-10-26 DIAGNOSIS — IMO0002 Reserved for concepts with insufficient information to code with codable children: Secondary | ICD-10-CM | POA: Insufficient documentation

## 2013-10-26 DIAGNOSIS — D259 Leiomyoma of uterus, unspecified: Secondary | ICD-10-CM | POA: Insufficient documentation

## 2013-10-26 DIAGNOSIS — F32A Depression, unspecified: Secondary | ICD-10-CM | POA: Insufficient documentation

## 2013-10-26 DIAGNOSIS — G500 Trigeminal neuralgia: Secondary | ICD-10-CM | POA: Insufficient documentation

## 2016-07-09 ENCOUNTER — Encounter (HOSPITAL_COMMUNITY): Payer: Self-pay | Admitting: *Deleted

## 2016-07-09 ENCOUNTER — Emergency Department (HOSPITAL_COMMUNITY)
Admission: EM | Admit: 2016-07-09 | Discharge: 2016-07-09 | Disposition: A | Discharge status: Home / Self Care | Payer: Medicare Other | Attending: Emergency Medicine | Admitting: Emergency Medicine

## 2016-07-09 DIAGNOSIS — Z79899 Other long term (current) drug therapy: Secondary | ICD-10-CM | POA: Insufficient documentation

## 2016-07-09 DIAGNOSIS — E119 Type 2 diabetes mellitus without complications: Secondary | ICD-10-CM | POA: Diagnosis not present

## 2016-07-09 DIAGNOSIS — Z87891 Personal history of nicotine dependence: Secondary | ICD-10-CM | POA: Insufficient documentation

## 2016-07-09 DIAGNOSIS — Z7982 Long term (current) use of aspirin: Secondary | ICD-10-CM | POA: Insufficient documentation

## 2016-07-09 DIAGNOSIS — Z7984 Long term (current) use of oral hypoglycemic drugs: Secondary | ICD-10-CM | POA: Diagnosis not present

## 2016-07-09 DIAGNOSIS — I10 Essential (primary) hypertension: Secondary | ICD-10-CM | POA: Insufficient documentation

## 2016-07-09 LAB — CBC WITH DIFFERENTIAL/PLATELET
Basophils Absolute: 0 10*3/uL (ref 0.0–0.1)
Basophils Relative: 0 %
Eosinophils Absolute: 0 10*3/uL (ref 0.0–0.7)
Eosinophils Relative: 0 %
HCT: 37.2 % (ref 36.0–46.0)
Hemoglobin: 11.5 g/dL — ABNORMAL LOW (ref 12.0–15.0)
Lymphocytes Relative: 32 %
Lymphs Abs: 1.3 10*3/uL (ref 0.7–4.0)
MCH: 28 pg (ref 26.0–34.0)
MCHC: 30.9 g/dL (ref 30.0–36.0)
MCV: 90.5 fL (ref 78.0–100.0)
Monocytes Absolute: 0.4 10*3/uL (ref 0.1–1.0)
Monocytes Relative: 11 %
Neutro Abs: 2.3 10*3/uL (ref 1.7–7.7)
Neutrophils Relative %: 57 %
Platelets: 112 10*3/uL — ABNORMAL LOW (ref 150–400)
RBC: 4.11 MIL/uL (ref 3.87–5.11)
RDW: 14.5 % (ref 11.5–15.5)
WBC: 4 10*3/uL (ref 4.0–10.5)

## 2016-07-09 LAB — BASIC METABOLIC PANEL
Anion gap: 8 (ref 5–15)
BUN: 12 mg/dL (ref 6–20)
CO2: 27 mmol/L (ref 22–32)
Calcium: 9.7 mg/dL (ref 8.9–10.3)
Chloride: 108 mmol/L (ref 101–111)
Creatinine, Ser: 0.74 mg/dL (ref 0.44–1.00)
GFR calc Af Amer: >60 mL/min (ref >60)
GFR calc non Af Amer: >60 mL/min (ref >60)
Glucose, Bld: 95 mg/dL (ref 65–99)
Potassium: 4.4 mmol/L (ref 3.5–5.1)
Sodium: 143 mmol/L (ref 135–145)

## 2016-07-09 MED ORDER — FUROSEMIDE 40 MG PO TABS: 40.0000 mg | ORAL_TABLET | Freq: Every day | ORAL | 0 refills | Status: DC

## 2016-07-09 MED ORDER — FUROSEMIDE 20 MG PO TABS
40.0000 mg | ORAL_TABLET | Freq: Every day | ORAL | Status: DC
  Administered 2016-07-09: 40 mg via ORAL
  Filled 2016-07-09: qty 2

## 2016-07-09 MED ORDER — AMLODIPINE BESYLATE 5 MG PO TABS: 5.0000 mg | ORAL_TABLET | Freq: Every day | ORAL | 0 refills | Status: DC

## 2016-07-09 MED ORDER — AMLODIPINE BESYLATE 5 MG PO TABS
5.0000 mg | ORAL_TABLET | Freq: Every day | ORAL | Status: DC
  Administered 2016-07-09: 5 mg via ORAL
  Filled 2016-07-09: qty 1

## 2016-07-09 NOTE — ED Triage Notes (Signed)
States she woke up this am with a roaring in her ears states she took her B/P and it was high, states she ran out of her blood pressure medication 1 week ago.

## 2016-07-09 NOTE — ED Provider Notes (Signed)
[facility] DEPT Provider Note   CSN: [id number] Arrival date & time: 07/09/16  0307     History   Chief Complaint Chief Complaint  Patient presents with  . Headache  . Hypertension    HPI Julia Floyd is a 83 y.o. female.  HPI Patient presents to the emergency department with Elevated blood pressures.  The patient states that she receives her medications by mail and has not received her blood pressure medication.  Patient states that she has not had any significant headache.  She did say she had a dull headache yesterdayThe patient denies chest pain, shortness of breath, headache,blurred vision, neck pain, fever, cough, weakness, numbness, dizziness, anorexia, edema, abdominal pain, nausea, vomiting, diarrhea, rash, back pain, dysuria, hematemesis, bloody stool, near syncope, or syncope.  Patient states she did not take any medications prior to arrival for any of her symptoms.  Patient states that she does notice that her blood pressure staying elevated, and that is what was concerning her Past Medical History:  Diagnosis Date  . DDD (degenerative disc disease)   . Depression   . Diabetes ([place])   . HTN (hypertension)   . Psychotic disorder    secondary to the below factors. 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 Bipolar disorder, most likely type 3, this episode being manic  . Sixth cranial nerve palsy   . Trigeminal neuralgia   . Uterine fibroid     Patient Active Problem List   Diagnosis Date Noted  . HTN (hypertension)   . Diabetes ([place])   . Depression   . DDD (degenerative disc disease)   . Uterine fibroid   . Trigeminal neuralgia   . Sixth cranial nerve palsy     Past Surgical History:  Procedure Laterality Date  . BRONCHOSCOPY    . TRACHEOSTOMY      OB History    No data available       Home Medications    Prior to Admission medications   Medication Sig Start Date End Date Taking? Authorizing Provider  amLODipine  (NORVASC) 5 MG tablet Take 5 mg by mouth daily.   Yes Historical Provider, MD  aspirin 325 MG tablet Take 325 mg by mouth daily.   Yes Historical Provider, MD  Cholecalciferol (VITAMIN D PO) Take 1 tablet by mouth daily.   Yes Historical Provider, MD  Cyanocobalamin (VITAMIN B-12 PO) Take 1 tablet by mouth daily.   Yes Historical Provider, MD  furosemide (LASIX) 40 MG tablet Take 40 mg by mouth daily.   Yes Historical Provider, MD  metFORMIN (GLUCOPHAGE) 500 MG tablet Take 500 mg by mouth 2 (two) times daily with a meal.   Yes Historical Provider, MD    Family History Family History  Problem Relation Age of Onset  . Heart attack    . Diabetes      Social History Social History  Substance Use Topics  . Smoking status: Former Research scientist (life sciences)  . Smokeless tobacco: Never Used  . Alcohol use No     Allergies   Patient has no known allergies.   Review of Systems Review of Systems  All other systems negative except as documented in the HPI. All pertinent positives and negatives as reviewed in the HPI. Physical Exam Updated Vital Signs BP (!) 191/68   Pulse (!) 54   Temp 97.4 F (36.3 C) (Oral)   Resp 18   Ht 5\' 6"  (1.676 m)   Wt 77.1 kg   SpO2 100%  BMI 27.44 kg/m   Physical Exam  Constitutional: She is oriented to person, place, and time. She appears well-developed and well-nourished. No distress.  HENT:  Head: Normocephalic and atraumatic.  Mouth/Throat: Oropharynx is clear and moist.  Eyes: Pupils are equal, round, and reactive to light.  Neck: Normal range of motion. Neck supple.  Cardiovascular: Normal rate, regular rhythm and normal heart sounds.  Exam reveals no gallop and no friction rub.   No murmur heard. Pulmonary/Chest: Effort normal and breath sounds normal. No respiratory distress. She has no wheezes.  Abdominal: Soft. Bowel sounds are normal. She exhibits no distension. There is no tenderness.  Neurological: She is alert and oriented to person, place, and time.  She exhibits normal muscle tone. Coordination normal.  Skin: Skin is warm and dry. Capillary refill takes less than 2 seconds. No rash noted. No erythema.  Psychiatric: She has a normal mood and affect. Her behavior is normal.  Nursing note and vitals reviewed.    ED Treatments / Results  Labs (all labs ordered are listed, but only abnormal results are displayed) Labs Reviewed  CBC WITH DIFFERENTIAL/PLATELET - Abnormal; Notable for the following:       Result Value   Hemoglobin 11.5 (*)    Platelets 112 (*)    All other components within normal limits  BASIC METABOLIC PANEL    EKG  EKG Interpretation None       Radiology No results found.  Procedures Procedures (including critical care time)  Medications Ordered in ED Medications  amLODipine (NORVASC) tablet 5 mg (5 mg Oral Given 07/09/16 0804)  furosemide (LASIX) tablet 40 mg (40 mg Oral Given 07/09/16 0804)     Initial Impression / Assessment and Plan / ED Course  I have reviewed the triage vital signs and the nursing notes.  Pertinent labs & imaging results that were available during my care of the patient were reviewed by me and considered in my medical decision making (see chart for details).     The patient will be given several days worth of her blood pressure medications and advised to return here for any worsening in her condition.  I did advise her to follow with her primary care Dr. for a recheck.  Patient is showing no signs of end organ damage or acute hypertensive urgency or crisis.  Patient is given strict return precautions.  I did advise her that obviously with elevated blood pressures there are risks that increase for stroke and heart attack.  Patient acknowledges this and agrees to return for any changes in her condition  Final Clinical Impressions(s) / ED Diagnoses   Final diagnoses:  None    New Prescriptions New Prescriptions   No medications on file     Dalia Heading, PA-C 07/09/16  [place], DO 07/09/16 2344

## 2016-07-09 NOTE — Discharge Planning (Signed)
Pt up for discharge. EDCM reviewed chart for possible CM needs.  No needs identified or communicated.  

## 2016-07-09 NOTE — Discharge Instructions (Signed)
Return here as needed.  Follow-up with your mail-order medication company.  Follow-up with your primary doctor for a recheck

## 2016-08-18 ENCOUNTER — Emergency Department (HOSPITAL_COMMUNITY): Payer: Medicare Other

## 2016-08-18 ENCOUNTER — Emergency Department (HOSPITAL_COMMUNITY)
Admission: EM | Admit: 2016-08-18 | Discharge: 2016-08-18 | Disposition: A | Discharge status: Home / Self Care | Payer: Medicare Other | Attending: Emergency Medicine | Admitting: Emergency Medicine

## 2016-08-18 DIAGNOSIS — E119 Type 2 diabetes mellitus without complications: Secondary | ICD-10-CM | POA: Diagnosis not present

## 2016-08-18 DIAGNOSIS — Z79899 Other long term (current) drug therapy: Secondary | ICD-10-CM | POA: Insufficient documentation

## 2016-08-18 DIAGNOSIS — R200 Anesthesia of skin: Secondary | ICD-10-CM | POA: Diagnosis not present

## 2016-08-18 DIAGNOSIS — Z5181 Encounter for therapeutic drug level monitoring: Secondary | ICD-10-CM | POA: Diagnosis not present

## 2016-08-18 DIAGNOSIS — I10 Essential (primary) hypertension: Secondary | ICD-10-CM | POA: Insufficient documentation

## 2016-08-18 DIAGNOSIS — R202 Paresthesia of skin: Secondary | ICD-10-CM | POA: Insufficient documentation

## 2016-08-18 DIAGNOSIS — Z7984 Long term (current) use of oral hypoglycemic drugs: Secondary | ICD-10-CM | POA: Diagnosis not present

## 2016-08-18 DIAGNOSIS — Z87891 Personal history of nicotine dependence: Secondary | ICD-10-CM | POA: Insufficient documentation

## 2016-08-18 DIAGNOSIS — Z7982 Long term (current) use of aspirin: Secondary | ICD-10-CM | POA: Diagnosis not present

## 2016-08-18 LAB — I-STAT CHEM 8, ED
BUN: 17 mg/dL (ref 6–20)
CALCIUM ION: 1.18 mmol/L (ref 1.15–1.40)
CHLORIDE: 109 mmol/L (ref 101–111)
CREATININE: 1 mg/dL (ref 0.44–1.00)
Glucose, Bld: 98 mg/dL (ref 65–99)
HCT: 30 % — ABNORMAL LOW (ref 36.0–46.0)
Hemoglobin: 10.2 g/dL — ABNORMAL LOW (ref 12.0–15.0)
Potassium: 4.2 mmol/L (ref 3.5–5.1)
SODIUM: 142 mmol/L (ref 135–145)
TCO2: 24 mmol/L (ref 0–100)

## 2016-08-18 LAB — CBC
HCT: 31.3 % — ABNORMAL LOW (ref 36.0–46.0)
HEMOGLOBIN: 9.8 g/dL — AB (ref 12.0–15.0)
MCH: 29.2 pg (ref 26.0–34.0)
MCHC: 31.3 g/dL (ref 30.0–36.0)
MCV: 93.2 fL (ref 78.0–100.0)
Platelets: 109 10*3/uL — ABNORMAL LOW (ref 150–400)
RBC: 3.36 MIL/uL — ABNORMAL LOW (ref 3.87–5.11)
RDW: 16.7 % — ABNORMAL HIGH (ref 11.5–15.5)
WBC: 4.3 10*3/uL (ref 4.0–10.5)

## 2016-08-18 LAB — COMPREHENSIVE METABOLIC PANEL
ALK PHOS: 76 U/L (ref 38–126)
ALT: 7 U/L — AB (ref 14–54)
AST: 14 U/L — AB (ref 15–41)
Albumin: 3.4 g/dL — ABNORMAL LOW (ref 3.5–5.0)
Anion gap: 7 (ref 5–15)
BUN: 16 mg/dL (ref 6–20)
CALCIUM: 8.9 mg/dL (ref 8.9–10.3)
CHLORIDE: 109 mmol/L (ref 101–111)
CO2: 23 mmol/L (ref 22–32)
CREATININE: 0.88 mg/dL (ref 0.44–1.00)
GFR calc Af Amer: >60 mL/min (ref >60)
GFR, EST NON AFRICAN AMERICAN: 59 mL/min — AB (ref >60)
Glucose, Bld: 101 mg/dL — ABNORMAL HIGH (ref 65–99)
Potassium: 4.2 mmol/L (ref 3.5–5.1)
Sodium: 139 mmol/L (ref 135–145)
TOTAL PROTEIN: 7.4 g/dL (ref 6.5–8.1)
Total Bilirubin: 0.6 mg/dL (ref 0.3–1.2)

## 2016-08-18 LAB — PROTIME-INR
INR: 1.05
Prothrombin Time: 13.7 seconds (ref 11.4–15.2)

## 2016-08-18 LAB — DIFFERENTIAL
BASOS ABS: 0 10*3/uL (ref 0.0–0.1)
Basophils Relative: 0 %
Eosinophils Absolute: 0 10*3/uL (ref 0.0–0.7)
Eosinophils Relative: 0 %
LYMPHS ABS: 1.6 10*3/uL (ref 0.7–4.0)
Lymphocytes Relative: 38 %
MONO ABS: 0.5 10*3/uL (ref 0.1–1.0)
MONOS PCT: 12 %
NEUTROS ABS: 2.2 10*3/uL (ref 1.7–7.7)
Neutrophils Relative %: 50 %

## 2016-08-18 LAB — I-STAT TROPONIN, ED: TROPONIN I, POC: 0 ng/mL (ref 0.00–0.08)

## 2016-08-18 LAB — CBG MONITORING, ED: Glucose-Capillary: 98 mg/dL (ref 65–99)

## 2016-08-18 LAB — APTT: APTT: 32 s (ref 24–36)

## 2016-08-18 MED ORDER — LORAZEPAM 2 MG/ML IJ SOLN
1.0000 mg | Freq: Once | INTRAMUSCULAR | Status: AC
  Administered 2016-08-18: 1 mg via INTRAVENOUS
  Filled 2016-08-18: qty 1

## 2016-08-18 MED ORDER — GADOBENATE DIMEGLUMINE 529 MG/ML IV SOLN
15.0000 mL | Freq: Once | INTRAVENOUS | Status: AC
  Administered 2016-08-18: 15 mL via INTRAVENOUS

## 2016-08-18 MED ORDER — GADOBENATE DIMEGLUMINE 529 MG/ML IV SOLN: 15.0000 mL | Freq: Once | INTRAVENOUS | Status: DC | PRN

## 2016-08-18 NOTE — ED Notes (Signed)
Patient transported to MRI 

## 2016-08-18 NOTE — Consult Note (Signed)
Chief Complaint: Bilateral arm and hand numbness/tingling  History obtained from: Patient and Chart  HPI:                                                                                                                                      Julia Floyd is an 83 y.o. female who presented to this afternoon  reporting bilateral arm and hand numbness and tingling, left greater than right. Julia Floyd states that she woke up from a nap around 12:00 this afternoon and felt that both of her arms and hands were numb and tingling after waking. She endorsed no loss of strength in either extremity. Julia Floyd stated that waking with numb/tingling upper extremities has happened fairly frequently on several other occasions, namely when she wakes from sleeping. It is typically shorter in duration and therefore she became concerned and has sought care in the ED.   In 2002, Julia Floyd was brought to the hospital with "stroke, weakness" per MRI report. The note and image are not available for review. 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 PMH also significant for diabetes, hypertension, sixth nerve palsy and mixed-type dementia. Julia Floyd conversed appropriately during our interaction. She is typically wheelchair bound.  Date last known well: Date: 08/18/2016 Time last known well: Time: 12:00  tPA Given: No: Modified Rankin Score: Rankin Score=1  Stroke Risk Factors - diabetes mellitus, family history, hypertension and prior smoking history   Past Medical History:  Diagnosis Date  . DDD (degenerative disc disease)   . Depression   . Diabetes ([place])   . HTN (hypertension)   . Psychotic disorder    secondary to the below factors. 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 Bipolar disorder, most likely type 3, this episode being manic  . Sixth cranial nerve palsy   . Trigeminal neuralgia   . Uterine fibroid     Past Surgical  History:  Procedure Laterality Date  . BRONCHOSCOPY    . TRACHEOSTOMY      Family History  Problem Relation Age of Onset  . Heart attack Unknown   . Diabetes Unknown     Social History:  reports that she has quit smoking. She has never used smokeless tobacco. She reports that she does not drink alcohol or use drugs.  Allergies: No Known Allergies  Medications:  Prior to Admission:  (Not in a hospital admission) Scheduled: PRN:  Review Of Systems:                                                                                                           History obtained from the patient  General: Feeling well other than what is noted in the HPI Psychological: Positive for memory difficulty and psychiatric condition Ophthalmic: Negative for blurry or double vision, loss of vision  ENT: Negative for tinnitus or vertigo Respiratory: Negative for cough, hemoptysis Cardiovascular: Negative for chest pain, dyspnea on exertion. Positive pedal edema Gastrointestinal: Negative for abdominal pain Musculoskeletal: Bilateral ankle swelling. Negative for muscular weakness Neurological: As noted in HPI  Blood pressure (!) 165/57, pulse 62, temperature 98.5 F (36.9 C), temperature source Oral, resp. rate 18, height 5\' 6"  (1.676 m), weight 73.3 kg (161 lb 9.6 oz), SpO2 100 %.   Physical Examination:                                                                                                      General: WDWN elderly female; pleasant and in no acute distress. HEENT:  Normocephalic, without obvious abnormality.  Normal external eye and conjunctiva. Normal external nose, mucus membranes and septum.  Cardiovascular: S1, S2 normal, pulses palpable throughout   Pulmonary: chest clear, no wheezing, rales, normal symmetric air entry, no tachypnea, retractions or  cyanosis Abdomen: Soft, non-tender Extremities/MSK: Ankle swelling, no joint deformities noted otherwise.Tone and bulk:normal tone throughout; no atrophy noted Lymphatic: no adenopathy palpable Skin: warm and dry, no hyperpigmentation, vitiligo, or suspicious lesions  Neurological Examination:                                                                                               Mental Status: Julia Floyd has appropriate thought content.  Speech is fluent without evidence of aphasia. She is able to follow 3-step commands without difficulty. She is oriented to the monht, but cannot give the year.  Cranial Nerves: II: Visual fields grossly normal, pupils are equal, round, reactive to light and accommodation. III,IV, VI: Ptosis not present, extra-ocular muscle movements intact bilaterally. No evidence of current sixth nerve palsy V,VII: Smile and eyebrow raise is symmetric. Facial light touch  and pinprick sensation intact bilaterally VIII: Hearing grossly intact IX,X: Uvula and palate rise symmetrically XI: SCM and bilateral shoulder shrug strength 5/5 XII: Midline tongue extension Motor: Right :     Upper extremity   5/5   Left:     Upper extremity   5/5          Lower extremity   4/5    Lower extremity   4/5 Pronator drift not present Sensory: Pinprick and light touch diminished throughout the upper extremities, left greater than right. There was a greater diminishment of pinprick sensation on left fifth finger, with increasing sensation towards the thumb (to approximately equal that of the right). Deep Tendon Reflexes: 2+ and symmetric throughout the upper extremities. Patellar 3+ with cross-adductor activation bilaterally. Ankle jerk 1+ Plantars: equivocal bilaterally. Hoffman's sign negative Gait: Not tested  Lab Results: Basic Metabolic Panel:  Recent Labs Lab 08/18/16 1457 08/18/16 1511  NA 139 142  K 4.2 4.2  CL 109 109  CO2 23  --   GLUCOSE 101* 98  BUN 16 17   CREATININE 0.88 1.00  CALCIUM 8.9  --     Liver Function Tests:  Recent Labs Lab 08/18/16 1457  AST 14*  ALT 7*  ALKPHOS 76  BILITOT 0.6  PROT 7.4  ALBUMIN 3.4*   No results for input(s): LIPASE, AMYLASE in the last 168 hours. No results for input(s): AMMONIA in the last 168 hours.  CBC:  Recent Labs Lab 08/18/16 1457 08/18/16 1511  WBC 4.3  --   NEUTROABS 2.2  --   HGB 9.8* 10.2*  HCT 31.3* 30.0*  MCV 93.2  --   PLT 109*  --     Cardiac Enzymes: No results for input(s): CKTOTAL, CKMB, CKMBINDEX, TROPONINI in the last 168 hours.  Lipid Panel: No results for input(s): CHOL, TRIG, HDL, CHOLHDL, VLDL, LDLCALC in the last 168 hours.  CBG:  Recent Labs Lab 08/18/16 1503  GLUCAP 98    Microbiology: No results found for this or any previous visit.  Coagulation Studies:  Recent Labs  08/18/16 1457  LABPROT 13.7  INR 1.05    Imaging: No results found.  Assessment and plan per attending neurologist.    Lupita Raider PA-C [facility]  08/18/2016, 4:01 PM   I have seen and evaluated the patient. I have reviewed the above note and made appropriate changes.    Impression: 83 year old female with intermittent bilateral arm numbness and weakness which is more persistent today. I suspect that she has a long-standing myelopathy related to the event in 2002. Given her findings, I do think a compressive myelopathy would be prudent to rule out, and would also favor looking at the thoracic spine as well given the extensive nature of her previous findings.  Recommendations: 1) MRI C and T spine with and without contrast 2) further recommendations following the above imaging.  Roland Rack, MD [facility] [phone]  If 7pm- 7am, please page neurology on call as listed in [place].

## 2016-08-18 NOTE — ED Provider Notes (Signed)
[place] DEPT Provider Note   CSN: [id number] Arrival date & time: 08/18/16  1441     History   Chief Complaint Chief Complaint  Patient presents with  . Numbness    HPI Julia Floyd is a 83 y.o. female.  The history is provided by the patient, a relative and medical records.  Neurologic Problem  This is a recurrent problem. The current episode started 3 to 5 hours ago. The problem occurs constantly. The problem has been gradually improving. Pertinent negatives include no chest pain, no abdominal pain, no headaches and no shortness of breath. Nothing aggravates the symptoms. Nothing relieves the symptoms. She has tried nothing for the symptoms. The treatment provided no relief.    Past Medical History:  Diagnosis Date  . DDD (degenerative disc disease)   . Depression   . Diabetes ([place])   . HTN (hypertension)   . Psychotic disorder    secondary to the below factors. 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 Bipolar disorder, most likely type 3, this episode being manic  . Sixth cranial nerve palsy   . Trigeminal neuralgia   . Uterine fibroid     Patient Active Problem List   Diagnosis Date Noted  . HTN (hypertension)   . Diabetes ([place])   . Depression   . DDD (degenerative disc disease)   . Uterine fibroid   . Trigeminal neuralgia   . Sixth cranial nerve palsy     Past Surgical History:  Procedure Laterality Date  . BRONCHOSCOPY    . TRACHEOSTOMY      OB History    No data available       Home Medications    Prior to Admission medications   Medication Sig Start Date End Date Taking? Authorizing Provider  amLODipine (NORVASC) 5 MG tablet Take 1 tablet (5 mg total) by mouth daily. 07/09/16   Lawyer, Harrell Gave, PA-C  aspirin 325 MG tablet Take 325 mg by mouth daily.    [provider]  Cholecalciferol (VITAMIN D PO) Take 1 tablet by mouth daily.    [provider]  Cyanocobalamin (VITAMIN B-12 PO) Take 1  tablet by mouth daily.    [provider]  furosemide (LASIX) 40 MG tablet Take 1 tablet (40 mg total) by mouth daily. 07/09/16   Lawyer, Harrell Gave, PA-C  metFORMIN (GLUCOPHAGE) 500 MG tablet Take 500 mg by mouth 2 (two) times daily with a meal.    [provider]    Family History Family History  Problem Relation Age of Onset  . Heart attack Unknown   . Diabetes Unknown     Social History Social History  Substance Use Topics  . Smoking status: Former Research scientist (life sciences)  . Smokeless tobacco: Never Used  . Alcohol use No     Allergies   Patient has no known allergies.   Review of Systems Review of Systems  Constitutional: Negative for activity change, appetite change, chills, diaphoresis, fatigue and fever.  HENT: Negative for congestion and rhinorrhea.   Eyes: Negative for visual disturbance.  Respiratory: Negative for cough, chest tightness, shortness of breath and stridor.   Cardiovascular: Negative for chest pain, palpitations and leg swelling.  Gastrointestinal: Negative for abdominal distention, abdominal pain, constipation, diarrhea, nausea and vomiting.  Genitourinary: Negative for difficulty urinating, dysuria, flank pain, frequency, hematuria, menstrual problem, pelvic pain, vaginal bleeding and vaginal discharge.  Musculoskeletal: Negative for back pain and neck pain.  Skin: Negative for rash and wound.  Neurological: Positive for numbness.  Negative for dizziness, tremors, facial asymmetry, weakness, light-headedness and headaches.  Psychiatric/Behavioral: Negative for agitation and confusion.  All other systems reviewed and are negative.    Physical Exam Updated Vital Signs BP (!) 162/53   Pulse 61   Temp 98.5 F (36.9 C) (Oral)   Resp 19   Ht 5\' 6"  (1.676 m)   Wt 73.3 kg (161 lb 9.6 oz)   SpO2 100%   BMI 26.08 kg/m   Physical Exam  Constitutional: She is oriented to person, place, and time. She appears well-developed and well-nourished. No  distress.  HENT:  Head: Normocephalic and atraumatic.  Mouth/Throat: Oropharynx is clear and moist. No oropharyngeal exudate.  Eyes: Conjunctivae are normal. Pupils are equal, round, and reactive to light.  Neck: Normal range of motion. Neck supple.  Cardiovascular: Normal rate, regular rhythm and intact distal pulses.   No murmur heard. Pulmonary/Chest: Effort normal and breath sounds normal. No respiratory distress.  Abdominal: Soft. There is no tenderness.  Musculoskeletal: She exhibits no edema or tenderness.  Neurological: She is alert and oriented to person, place, and time. She displays normal reflexes. No cranial nerve deficit or sensory deficit. She exhibits normal muscle tone. Coordination normal.  Skin: Skin is warm and dry. Capillary refill takes less than 2 seconds. No rash noted. No erythema.  Psychiatric: She has a normal mood and affect.  Nursing note and vitals reviewed.    ED Treatments / Results  Labs (all labs ordered are listed, but only abnormal results are displayed) Labs Reviewed  CBC - Abnormal; Notable for the following:       Result Value   RBC 3.36 (*)    Hemoglobin 9.8 (*)    HCT 31.3 (*)    RDW 16.7 (*)    Platelets 109 (*)    All other components within normal limits  COMPREHENSIVE METABOLIC PANEL - Abnormal; Notable for the following:    Glucose, Bld 101 (*)    Albumin 3.4 (*)    AST 14 (*)    ALT 7 (*)    GFR calc non Af Amer 59 (*)    All other components within normal limits  I-STAT CHEM 8, ED - Abnormal; Notable for the following:    Hemoglobin 10.2 (*)    HCT 30.0 (*)    All other components within normal limits  PROTIME-INR  APTT  DIFFERENTIAL  URINALYSIS, ROUTINE W REFLEX MICROSCOPIC  I-STAT TROPOININ, ED  CBG MONITORING, ED    EKG  EKG Interpretation  Date/Time:  Monday Aug 18 2016 15:56:44 EDT Ventricular Rate:  63 PR Interval:    QRS Duration: 96 QT Interval:  431 QTC Calculation: 442 R Axis:   22 Text  Interpretation:  Sinus rhythm When compared to prior, no significant chcanges seen. no STEMI Confirmed by Antony Blackbird [phone]) on 08/18/2016 4:59:07 PM       Radiology Dg Chest 2 View  Result Date: 08/18/2016 CLINICAL DATA:  83 year old female with chills. No chest pain no shortness breath. Initial encounter. EXAM: CHEST  2 VIEW COMPARISON:  None available. FINDINGS: Increased markings retrocardiac region suggestive of crowding of vessels rather than infiltrate. If the patient had progressive symptoms then follow-up imaging with attention to this region can be obtained. Central pulmonary vascular prominence without pulmonary edema. No pneumothorax. Cardiomegaly. Calcified slightly tortuous aorta. No acute osseous abnormality. Bilateral acromioclavicular joint degenerative changes. IMPRESSION: Increased markings retrocardiac region suggestive of crowding of vessels rather than infiltrate. If the patient had progressive symptoms then  follow-up imaging with attention to this region can be obtained. Cardiomegaly. Electronically Signed   By: Genia Del M.D.   On: 08/18/2016 18:52   Mr Cervical Spine W Wo Contrast  Result Date: 08/18/2016 CLINICAL DATA:  83 y/o F; bilateral arm numbness falling off for 1-2 months, left greater than right. EXAM: MRI CERVICAL AND THORACIC SPINE WITHOUT AND WITH CONTRAST TECHNIQUE: Multiplanar and multiecho pulse sequences of the cervical spine, to include the craniocervical junction and cervicothoracic junction, and thoracic spine, were obtained without and with intravenous contrast. CONTRAST:  62mL MULTIHANCE GADOBENATE DIMEGLUMINE 529 MG/ML IV SOLN COMPARISON:  06/09/2000 MRI of the cervical spine report FINDINGS: MRI CERVICAL SPINE FINDINGS Alignment: Straightening of cervical lordosis.  No listhesis. Vertebrae: Mild degenerative endplate edema at the Q4-6 level. No evidence for acute fracture, diskitis, or suspicious bone lesion. Cord: There is diffuse abnormal cord  signal on fluid at the C2-3 level and within the posterior column from C3 to C7. No definite syrinx. Abnormal cord signal is associated with loss of cord volume at the C2-3 level. No abnormal enhancement. Posterior Fossa, vertebral arteries, paraspinal tissues: Patchy foci of T2 FLAIR hyperintense signal abnormality within pons and cerebellum likely represent chronic microvascular ischemic changes. Disc levels: C2-3: No significant disc displacement, foraminal narrowing, or canal stenosis. C3-4: Left-greater-than-right uncovertebral and facet hypertrophy with mild left foraminal narrowing. No significant canal stenosis. C4-5: Disc osteophyte complex with bilateral uncovertebral and facet hypertrophy. Mild canal stenosis. Mild bilateral foraminal narrowing. C5-6: Disc osteophyte complex with left-greater-than-right uncovertebral and facet hypertrophy. Mild right and moderate left foraminal narrowing. Mild canal stenosis. C6-7: Disc osteophyte complex with bilateral uncovertebral and facet hypertrophy. Mild bilateral foraminal narrowing. Mild canal stenosis. C7-T1: Disc osteophyte complex and facet hypertrophy. No significant foraminal narrowing or canal stenosis. MRI THORACIC SPINE FINDINGS Alignment: Physiologic. Vertebrae: No fracture, evidence of discitis, or bone lesion. No abnormal enhancement. Cord: 1 segment length T2 hyperintense lesion within the core centered at the T4 level. No additional abnormal cord signal is identified within the thoracic spine. No abnormal enhancement. Posterior Fossa, vertebral arteries, paraspinal tissues: Negative. Disc levels: Multilevel disc desiccation and disc space narrowing. No significant disc displacement, foraminal narrowing, or canal stenosis. IMPRESSION: 1. Abnormal cord signal extending from the C2-3 level to the C7 level, cord volume loss at C2-3, and additional lesion at the T4 level. No abnormal enhancement. Findings probably represent chronic sequelae of the cord  abnormality described on the 2002 cervical MRI report. A superimposed acute process is not excluded. Concentration of signal abnormality in the posterior column of the cervical cord is suggestive of subacute combined degeneration, but sequelae of myelitis or ischemia are also possible. 2. Mild cervical and thoracic spine spondylosis without high-grade canal stenosis or foraminal narrowing. 3. No acute osseous abnormality or abnormal enhancement of the cervical and thoracic spine. Electronically Signed   By: Kristine Garbe M.D.   On: 08/18/2016 18:46   Mr Thoracic Spine W Wo Contrast  Result Date: 08/18/2016 CLINICAL DATA:  83 y/o F; bilateral arm numbness falling off for 1-2 months, left greater than right. EXAM: MRI CERVICAL AND THORACIC SPINE WITHOUT AND WITH CONTRAST TECHNIQUE: Multiplanar and multiecho pulse sequences of the cervical spine, to include the craniocervical junction and cervicothoracic junction, and thoracic spine, were obtained without and with intravenous contrast. CONTRAST:  68mL MULTIHANCE GADOBENATE DIMEGLUMINE 529 MG/ML IV SOLN COMPARISON:  06/09/2000 MRI of the cervical spine report FINDINGS: MRI CERVICAL SPINE FINDINGS Alignment: Straightening of cervical lordosis.  No listhesis.  Vertebrae: Mild degenerative endplate edema at the I5-0 level. No evidence for acute fracture, diskitis, or suspicious bone lesion. Cord: There is diffuse abnormal cord signal on fluid at the C2-3 level and within the posterior column from C3 to C7. No definite syrinx. Abnormal cord signal is associated with loss of cord volume at the C2-3 level. No abnormal enhancement. Posterior Fossa, vertebral arteries, paraspinal tissues: Patchy foci of T2 FLAIR hyperintense signal abnormality within pons and cerebellum likely represent chronic microvascular ischemic changes. Disc levels: C2-3: No significant disc displacement, foraminal narrowing, or canal stenosis. C3-4: Left-greater-than-right uncovertebral and  facet hypertrophy with mild left foraminal narrowing. No significant canal stenosis. C4-5: Disc osteophyte complex with bilateral uncovertebral and facet hypertrophy. Mild canal stenosis. Mild bilateral foraminal narrowing. C5-6: Disc osteophyte complex with left-greater-than-right uncovertebral and facet hypertrophy. Mild right and moderate left foraminal narrowing. Mild canal stenosis. C6-7: Disc osteophyte complex with bilateral uncovertebral and facet hypertrophy. Mild bilateral foraminal narrowing. Mild canal stenosis. C7-T1: Disc osteophyte complex and facet hypertrophy. No significant foraminal narrowing or canal stenosis. MRI THORACIC SPINE FINDINGS Alignment: Physiologic. Vertebrae: No fracture, evidence of discitis, or bone lesion. No abnormal enhancement. Cord: 1 segment length T2 hyperintense lesion within the core centered at the T4 level. No additional abnormal cord signal is identified within the thoracic spine. No abnormal enhancement. Posterior Fossa, vertebral arteries, paraspinal tissues: Negative. Disc levels: Multilevel disc desiccation and disc space narrowing. No significant disc displacement, foraminal narrowing, or canal stenosis. IMPRESSION: 1. Abnormal cord signal extending from the C2-3 level to the C7 level, cord volume loss at C2-3, and additional lesion at the T4 level. No abnormal enhancement. Findings probably represent chronic sequelae of the cord abnormality described on the 2002 cervical MRI report. A superimposed acute process is not excluded. Concentration of signal abnormality in the posterior column of the cervical cord is suggestive of subacute combined degeneration, but sequelae of myelitis or ischemia are also possible. 2. Mild cervical and thoracic spine spondylosis without high-grade canal stenosis or foraminal narrowing. 3. No acute osseous abnormality or abnormal enhancement of the cervical and thoracic spine. Electronically Signed   By: Kristine Garbe M.D.    On: 08/18/2016 18:46    Procedures Procedures (including critical care time)  Medications Ordered in ED Medications  gadobenate dimeglumine (MULTIHANCE) injection 15 mL ( Intravenous Canceled Entry 08/18/16 1810)  LORazepam (ATIVAN) injection 1 mg (1 mg Intravenous Given 08/18/16 1653)  gadobenate dimeglumine (MULTIHANCE) injection 15 mL (15 mLs Intravenous Contrast Given 08/18/16 1810)     Initial Impression / Assessment and Plan / ED Course  I have reviewed the triage vital signs and the nursing notes.  Pertinent labs & imaging results that were available during my care of the patient were reviewed by me and considered in my medical decision making (see chart for details).     Julia Floyd is a 83 y.o. female with a past medical history significant for hypertension, diabetes, and degenerative disc disease in her spine who presents with bilateral arm numbness and tingling and was made a code stroke in triage. Patient reports that she has had symptoms on and off for the last several months in her bilateral upper extremity. She said that she was feeling normal and she went to take a nap at noon and woke up this afternoon having numbness and tingling in both arms. The left was worse than right by report. She denies weakness. Patient says that this is similar to symptoms she has had in the  past. She denies any history of stroke.  Patient was quickly evaluated by neurology and they canceled a code stroke and canceled the CT scan.  On my evaluation, patient had symmetric grip strength. Patient had normal finger-nose-finger. Patient had mild tingling sensation but no numbness. No other focal neurologic deficits were seen. Lungs are clear and abdomen nontender.  Neurology was at the bedside shortly after arrival. Neurology followed the patient and felt that she was not having a stroke but may have some abnormalities in her spine. According to previous imaging, patient had a diffusely abnormal  cord either from myelitis or infarct in the past.  Neurology recommended MRI of the cervical and thoracic spine.   Laboratory testing was grossly reassuring.  MRI results are seen above. Patient had abnormal findings that may be unchanged from 2002. Neurology reports that they evaluated and do not feel she is having an acute problem in her spine. They feel the patient is safe for discharge with neurology follow-up. They did not recommend steroids or other medications. They recommended good return precautions for any new or worsened symptoms.  Patient and family agreed with plan of care and understood return precautions. Patient discharged in good condition.   Final Clinical Impressions(s) / ED Diagnoses   Final diagnoses:  Arm numbness  Arm numbness   Clinical Impression: 1. Paresthesia   2. Arm numbness   3. Numbness     Disposition: Discharge  Condition: Good  I have discussed the results, Dx and Tx plan with the pt(& family if present). He/she/they expressed understanding and agree(s) with the plan. Discharge instructions discussed at great length. Strict return precautions discussed and pt &/or family have verbalized understanding of the instructions. No further questions at time of discharge.    Discharge Medication List as of 08/18/2016 11:05 PM      Follow Up: [facility] [address]     [address] [place] [address] [phone] Schedule an appointment as soon as possible for a visit    [facility] [address] [id number] [place] [phone]  If symptoms worsen      Sergio Zawislak, Gwenyth Allegra, MD 08/19/16 [phone]

## 2016-08-18 NOTE — ED Triage Notes (Signed)
Fell asleep today, felt fine, woke up 2 hours ago, had numbness in both arms, with more numbness in left arm.

## 2016-08-18 NOTE — ED Notes (Signed)
Pt verbalized understanding discharge instructions and denies any further needs or questions at this time. VS stable, ambulatory and steady gait.   

## 2016-08-18 NOTE — Code Documentation (Signed)
83yo female presenting to [facility] at 84 via private vehicle.  Patient took a nap at 1200 and later woke up with bilateral hand numbness and reports left neck pain.  Code stroke called in the ED.  Patient to CT.  Stroke team to the bedside.  Dr. Leonel Ramsay at the bedside.  CT canceled per MD.  Code stroke canceled.  ED RN [name] aware.

## 2016-08-18 NOTE — Discharge Instructions (Signed)
The neurology team did not feel you had a stroke tonight. They would like you to follow-up with them in clinic for further management. Please stay hydrated. If any symptoms change or worsen, please return to the nearest emergency department.

## 2016-10-07 ENCOUNTER — Encounter (HOSPITAL_COMMUNITY): Payer: Self-pay | Admitting: Vascular Surgery

## 2016-10-07 ENCOUNTER — Inpatient Hospital Stay (HOSPITAL_COMMUNITY)
Admission: EM | Admit: 2016-10-07 | Discharge: 2016-10-14 | DRG: 061 | Disposition: A | Discharge status: Rehabilitation Facility | Payer: Medicare Other | Attending: Neurology | Admitting: Neurology

## 2016-10-07 ENCOUNTER — Emergency Department (HOSPITAL_COMMUNITY): Payer: Medicare Other

## 2016-10-07 DIAGNOSIS — E119 Type 2 diabetes mellitus without complications: Secondary | ICD-10-CM | POA: Diagnosis present

## 2016-10-07 DIAGNOSIS — R29707 NIHSS score 7: Secondary | ICD-10-CM | POA: Diagnosis present

## 2016-10-07 DIAGNOSIS — G309 Alzheimer's disease, unspecified: Secondary | ICD-10-CM | POA: Diagnosis present

## 2016-10-07 DIAGNOSIS — E854 Organ-limited amyloidosis: Secondary | ICD-10-CM | POA: Diagnosis present

## 2016-10-07 DIAGNOSIS — I680 Cerebral amyloid angiopathy: Secondary | ICD-10-CM | POA: Diagnosis present

## 2016-10-07 DIAGNOSIS — Z87891 Personal history of nicotine dependence: Secondary | ICD-10-CM | POA: Diagnosis not present

## 2016-10-07 DIAGNOSIS — I69334 Monoplegia of upper limb following cerebral infarction affecting left non-dominant side: Secondary | ICD-10-CM | POA: Diagnosis not present

## 2016-10-07 DIAGNOSIS — Z6825 Body mass index (BMI) 25.0-25.9, adult: Secondary | ICD-10-CM | POA: Diagnosis not present

## 2016-10-07 DIAGNOSIS — F319 Bipolar disorder, unspecified: Secondary | ICD-10-CM | POA: Diagnosis present

## 2016-10-07 DIAGNOSIS — Z993 Dependence on wheelchair: Secondary | ICD-10-CM | POA: Diagnosis not present

## 2016-10-07 DIAGNOSIS — I619 Nontraumatic intracerebral hemorrhage, unspecified: Secondary | ICD-10-CM | POA: Diagnosis present

## 2016-10-07 DIAGNOSIS — I10 Essential (primary) hypertension: Secondary | ICD-10-CM | POA: Diagnosis present

## 2016-10-07 DIAGNOSIS — G936 Cerebral edema: Secondary | ICD-10-CM | POA: Diagnosis present

## 2016-10-07 DIAGNOSIS — I6300 Cerebral infarction due to thrombosis of unspecified precerebral artery: Secondary | ICD-10-CM | POA: Diagnosis not present

## 2016-10-07 DIAGNOSIS — D62 Acute posthemorrhagic anemia: Secondary | ICD-10-CM | POA: Diagnosis not present

## 2016-10-07 DIAGNOSIS — F29 Unspecified psychosis not due to a substance or known physiological condition: Secondary | ICD-10-CM | POA: Diagnosis present

## 2016-10-07 DIAGNOSIS — Z9282 Status post administration of tPA (rtPA) in a different facility within the last 24 hours prior to admission to current facility: Secondary | ICD-10-CM | POA: Diagnosis not present

## 2016-10-07 DIAGNOSIS — E663 Overweight: Secondary | ICD-10-CM | POA: Diagnosis present

## 2016-10-07 DIAGNOSIS — I6789 Other cerebrovascular disease: Secondary | ICD-10-CM | POA: Diagnosis not present

## 2016-10-07 DIAGNOSIS — G500 Trigeminal neuralgia: Secondary | ICD-10-CM | POA: Diagnosis present

## 2016-10-07 DIAGNOSIS — K5901 Slow transit constipation: Secondary | ICD-10-CM | POA: Diagnosis not present

## 2016-10-07 DIAGNOSIS — Z79899 Other long term (current) drug therapy: Secondary | ICD-10-CM | POA: Diagnosis not present

## 2016-10-07 DIAGNOSIS — E785 Hyperlipidemia, unspecified: Secondary | ICD-10-CM | POA: Diagnosis present

## 2016-10-07 DIAGNOSIS — R32 Unspecified urinary incontinence: Secondary | ICD-10-CM | POA: Diagnosis not present

## 2016-10-07 DIAGNOSIS — M792 Neuralgia and neuritis, unspecified: Secondary | ICD-10-CM | POA: Diagnosis not present

## 2016-10-07 DIAGNOSIS — I611 Nontraumatic intracerebral hemorrhage in hemisphere, cortical: Secondary | ICD-10-CM | POA: Diagnosis not present

## 2016-10-07 DIAGNOSIS — K5900 Constipation, unspecified: Secondary | ICD-10-CM | POA: Diagnosis not present

## 2016-10-07 DIAGNOSIS — N3941 Urge incontinence: Secondary | ICD-10-CM | POA: Diagnosis not present

## 2016-10-07 DIAGNOSIS — Z7982 Long term (current) use of aspirin: Secondary | ICD-10-CM

## 2016-10-07 DIAGNOSIS — R4701 Aphasia: Secondary | ICD-10-CM | POA: Diagnosis present

## 2016-10-07 DIAGNOSIS — F0280 Dementia in other diseases classified elsewhere without behavioral disturbance: Secondary | ICD-10-CM | POA: Diagnosis present

## 2016-10-07 DIAGNOSIS — Z8673 Personal history of transient ischemic attack (TIA), and cerebral infarction without residual deficits: Secondary | ICD-10-CM | POA: Diagnosis not present

## 2016-10-07 DIAGNOSIS — I639 Cerebral infarction, unspecified: Principal | ICD-10-CM | POA: Diagnosis present

## 2016-10-07 DIAGNOSIS — I69398 Other sequelae of cerebral infarction: Secondary | ICD-10-CM | POA: Diagnosis not present

## 2016-10-07 DIAGNOSIS — Z7984 Long term (current) use of oral hypoglycemic drugs: Secondary | ICD-10-CM | POA: Diagnosis not present

## 2016-10-07 DIAGNOSIS — R269 Unspecified abnormalities of gait and mobility: Secondary | ICD-10-CM | POA: Diagnosis not present

## 2016-10-07 DIAGNOSIS — R29818 Other symptoms and signs involving the nervous system: Secondary | ICD-10-CM

## 2016-10-07 LAB — I-STAT CHEM 8, ED
BUN: 18 mg/dL (ref 6–20)
CHLORIDE: 105 mmol/L (ref 101–111)
Calcium, Ion: 1.13 mmol/L — ABNORMAL LOW (ref 1.15–1.40)
Creatinine, Ser: 0.9 mg/dL (ref 0.44–1.00)
Glucose, Bld: 99 mg/dL (ref 65–99)
HEMATOCRIT: 29 % — AB (ref 36.0–46.0)
HEMOGLOBIN: 9.9 g/dL — AB (ref 12.0–15.0)
POTASSIUM: 3.7 mmol/L (ref 3.5–5.1)
SODIUM: 141 mmol/L (ref 135–145)
TCO2: 24 mmol/L (ref 0–100)

## 2016-10-07 LAB — DIFFERENTIAL
BASOS ABS: 0 10*3/uL (ref 0.0–0.1)
Basophils Relative: 0 %
EOS ABS: 0 10*3/uL (ref 0.0–0.7)
Eosinophils Relative: 0 %
LYMPHS ABS: 1.9 10*3/uL (ref 0.7–4.0)
Lymphocytes Relative: 34 %
Monocytes Absolute: 1 10*3/uL (ref 0.1–1.0)
Monocytes Relative: 18 %
NEUTROS ABS: 2.6 10*3/uL (ref 1.7–7.7)
NEUTROS PCT: 48 %

## 2016-10-07 LAB — COMPREHENSIVE METABOLIC PANEL
ALBUMIN: 3.3 g/dL — AB (ref 3.5–5.0)
ALT: 7 U/L — AB (ref 14–54)
ANION GAP: 8 (ref 5–15)
AST: 16 U/L (ref 15–41)
Alkaline Phosphatase: 81 U/L (ref 38–126)
BUN: 16 mg/dL (ref 6–20)
CHLORIDE: 106 mmol/L (ref 101–111)
CO2: 24 mmol/L (ref 22–32)
Calcium: 9.1 mg/dL (ref 8.9–10.3)
Creatinine, Ser: 0.91 mg/dL (ref 0.44–1.00)
GFR calc non Af Amer: 57 mL/min — ABNORMAL LOW (ref >60)
GLUCOSE: 100 mg/dL — AB (ref 65–99)
Potassium: 3.7 mmol/L (ref 3.5–5.1)
SODIUM: 138 mmol/L (ref 135–145)
Total Bilirubin: 0.6 mg/dL (ref 0.3–1.2)
Total Protein: 7.4 g/dL (ref 6.5–8.1)

## 2016-10-07 LAB — CBC
HCT: 30 % — ABNORMAL LOW (ref 36.0–46.0)
Hemoglobin: 9.3 g/dL — ABNORMAL LOW (ref 12.0–15.0)
MCH: 29.4 pg (ref 26.0–34.0)
MCHC: 31 g/dL (ref 30.0–36.0)
MCV: 94.9 fL (ref 78.0–100.0)
Platelets: 123 10*3/uL — ABNORMAL LOW (ref 150–400)
RBC: 3.16 MIL/uL — ABNORMAL LOW (ref 3.87–5.11)
RDW: 16.5 % — ABNORMAL HIGH (ref 11.5–15.5)
WBC: 5.5 10*3/uL (ref 4.0–10.5)

## 2016-10-07 LAB — I-STAT CG4 LACTIC ACID, ED: LACTIC ACID, VENOUS: 0.95 mmol/L (ref 0.5–1.9)

## 2016-10-07 LAB — MRSA PCR SCREENING: MRSA BY PCR: NEGATIVE

## 2016-10-07 LAB — APTT: APTT: 32 s (ref 24–36)

## 2016-10-07 LAB — I-STAT TROPONIN, ED: Troponin i, poc: 0.01 ng/mL (ref 0.00–0.08)

## 2016-10-07 LAB — PROTIME-INR
INR: 1.03
Prothrombin Time: 13.5 seconds (ref 11.4–15.2)

## 2016-10-07 LAB — CBG MONITORING, ED: GLUCOSE-CAPILLARY: 91 mg/dL (ref 65–99)

## 2016-10-07 MED ORDER — PANTOPRAZOLE SODIUM 40 MG IV SOLR
40.0000 mg | Freq: Every day | INTRAVENOUS | Status: DC
  Administered 2016-10-07 – 2016-10-10 (×4): 40 mg via INTRAVENOUS
  Filled 2016-10-07 (×4): qty 40

## 2016-10-07 MED ORDER — STROKE: EARLY STAGES OF RECOVERY BOOK
Freq: Once | Status: AC
  Administered 2016-10-07: 21:00:00
  Filled 2016-10-07: qty 1

## 2016-10-07 MED ORDER — NICARDIPINE HCL IN NACL 20-0.86 MG/200ML-% IV SOLN
3.0000 mg/h | INTRAVENOUS | Status: DC
  Administered 2016-10-08: 5 mg/h via INTRAVENOUS
  Administered 2016-10-08: 2 mg/h via INTRAVENOUS
  Filled 2016-10-07 (×2): qty 200

## 2016-10-07 MED ORDER — NICARDIPINE HCL IN NACL 20-0.86 MG/200ML-% IV SOLN: 3.0000 mg/h | INTRAVENOUS | Status: DC

## 2016-10-07 MED ORDER — LABETALOL HCL 5 MG/ML IV SOLN: 10.0000 mg | Freq: Once | INTRAVENOUS | Status: DC

## 2016-10-07 MED ORDER — IOPAMIDOL (ISOVUE-370) INJECTION 76%
INTRAVENOUS | Status: AC
  Administered 2016-10-07: 50 mL
  Filled 2016-10-07: qty 100

## 2016-10-07 MED ORDER — SODIUM CHLORIDE 0.9 % IV SOLN
50.0000 mL | Freq: Once | INTRAVENOUS | Status: AC
  Administered 2016-10-07: 50 mL via INTRAVENOUS

## 2016-10-07 MED ORDER — ACETAMINOPHEN 650 MG RE SUPP: 650.0000 mg | RECTAL | Status: DC | PRN

## 2016-10-07 MED ORDER — ACETAMINOPHEN 160 MG/5ML PO SOLN: 650.0000 mg | ORAL | Status: DC | PRN

## 2016-10-07 MED ORDER — LABETALOL HCL 5 MG/ML IV SOLN
INTRAVENOUS | Status: AC
  Filled 2016-10-07: qty 4

## 2016-10-07 MED ORDER — LABETALOL HCL 5 MG/ML IV SOLN
10.0000 mg | Freq: Once | INTRAVENOUS | Status: AC
  Administered 2016-10-07: 10 mg via INTRAVENOUS

## 2016-10-07 MED ORDER — ACETAMINOPHEN 325 MG PO TABS
650.0000 mg | ORAL_TABLET | ORAL | Status: DC | PRN
  Filled 2016-10-07: qty 2

## 2016-10-07 MED ORDER — ALTEPLASE (STROKE) FULL DOSE INFUSION
0.9000 mg/kg | Freq: Once | INTRAVENOUS | Status: AC
  Administered 2016-10-07: 67 mg via INTRAVENOUS

## 2016-10-07 MED ORDER — SODIUM CHLORIDE 0.9 % IV SOLN
INTRAVENOUS | Status: DC
  Administered 2016-10-07 – 2016-10-10 (×5): via INTRAVENOUS

## 2016-10-07 NOTE — Progress Notes (Signed)
Pts BP 193/67 @2100  on arrival to [facility] ICU.  No PRNs ordered. Dr Leonel Ramsay paged.  Order received for 10mg  labetalol x1 and to start cardene infusion if BP>180 after labetalol given. Will continue to monitor.  Mario Voong, [place] Marie, RN 10/07/2016

## 2016-10-07 NOTE — Progress Notes (Signed)
Julia Floyd is a 83 yo female coming to ED via private vehicle for acute onset of aphasia and a brief episode of unresponsiveness. Per daughter they were driving down the road to dinner when she noticed her mother became aphasic with a brief episode of unresponsive. Pt LKW 1800, CBG 91, NIH 7 TPA started at bedside, admitted to stroke team

## 2016-10-07 NOTE — ED Triage Notes (Signed)
Pt reports to the ED for eval of aphasia. Per daughter she was driving down the road to dinner with her mother and she had a sudden onset of aphasia and she had an unresponsive episode. Pt remains aphasic she is able to state her name but not where she is or what date it is currently. She does not follow commands. LSN 18:00 today.

## 2016-10-07 NOTE — H&P (Addendum)
Neurology H&P  CC: Aphasia  History is obtained from: Patient  HPI: Julia Floyd is a 83 y.o. female with a history of dementia, trigeminal neuralgia who presents with acute onset aphasia And was driving the car with her daughter around 34 PM. She was talking and then suddenly stopped talking. She was looking out the window, but would not respond or answer questions. There was some facial droop at onset, which has improved some since it began.  On arrival to the emergency department, could stroke was called. She was aphasic with a mild flattening of right nasolabial fold and within the window for IV TPA.  There are notes of an "unresponsive episode." On clarification, by this they mean that she was not talking.     LKW: 6 PM tpa given?: Yes  ROS: Unable to obtain due to altered mental status.   Past Medical History:  Diagnosis Date  . DDD (degenerative disc disease)   . Depression   . Diabetes ([place])   . HTN (hypertension)   . Psychotic disorder    secondary to the below factors. 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 Bipolar disorder, most likely type 3, this episode being manic  . Sixth cranial nerve palsy   . Trigeminal neuralgia   . Uterine fibroid      Family History  Problem Relation Age of Onset  . Heart attack Unknown   . Diabetes Unknown      Social History:  reports that she has quit smoking. She has never used smokeless tobacco. She reports that she does not drink alcohol or use drugs.   Exam: Current vital signs: BP (!) 165/69 (BP Location: Right Arm)   Pulse 75   Temp (!) 97.4 F (36.3 C) (Oral)   Resp 15   Ht 5\' 6"  (1.676 m)   Wt 74.2 kg (163 lb 9.3 oz)   SpO2 100%   BMI 26.40 kg/m  Vital signs in last 24 hours: Temp:  [97.4 F (36.3 C)] 97.4 F (36.3 C) (07/10 1851) Pulse Rate:  [75-89] 75 (07/10 1945) Resp:  [15-16] 15 (07/10 1932) BP: (131-172)/(52-69) 165/69 (07/10 2015) SpO2:  [97 %-100 %] 100 % (07/10  1945) Weight:  [74.2 kg (163 lb 9.3 oz)] 74.2 kg (163 lb 9.3 oz) (07/10 1928)  Physical Exam  Constitutional: Appears well-developed and well-nourished.  Psych: Affect appropriate to situation Eyes: No scleral injection HENT: No OP obstrucion Head: Normocephalic.  Cardiovascular: Normal rate and regular rhythm.  Respiratory: Effort normal GI: Soft.  No distension. There is no tenderness.  Skin: WDI Extremities: Pitting edema bilateral legs  Neuro: Mental Status: Patient is densely aphasic with some perseveration. She does not follow commands, she does tell me her name when asked. Responses to questions are not reliable, answering yes to most things. Cranial Nerves: II: Visual Fields are full. Pupils are equal, round, and reactive to light.  III,IV, VI: EOMI without ptosis or diploplia.  V: Facial sensation is symmetric to temperature VII: Facial movement with mild flattening of the right nasal labial fold VIII: hearing is intact to voice X: Uvula elevates symmetrically XI: Shoulder shrug is symmetric. XII: tongue is midline without atrophy or fasciculations.  Motor: Tone is normal. Bulk is normal. 5/5 strength was present in all four extremities.  Sensory: She responds to stimuli in all 4 extremities, relatively symmetrically Cerebellar: Does not perform  I have reviewed labs in epic and the results pertinent to this consultation are: CMP-unremarkable  I have  reviewed the images obtained: CT head-unremarkable, CTA-no large vessel occlusion  Impression: 83 yo F with acute onset aphasia who presents within the time window for IV tpa. This has been administered.   Recommendations: 1. HgbA1c, fasting lipid panel 2. MRI, MRA  of the brain without contrast 3. Frequent neuro checks 4. Echocardiogram 5. Carotid dopplers 6. Prophylactic therapy- none for 24 hours.  7. Risk factor modification 8. Telemetry monitoring 9. PT consult, OT consult, Speech consult 10. please page  stroke NP  Or  PA  Or MD  from 8am -4 pm as this patient will be followed by the stroke team at this point.   You can look them up on [email or web address]     This patient is critically ill and at significant risk of neurological worsening, death and care requires constant monitoring of vital signs, hemodynamics,respiratory and cardiac monitoring, neurological assessment, discussion with family, other specialists and medical decision making of high complexity. I spent 45 minutes of neurocritical care time  in the care of  this patient.  Roland Rack, MD [facility] [phone]  If 7pm- 7am, please page neurology on call as listed in [place]. 10/07/2016  8:19 PM

## 2016-10-07 NOTE — ED Provider Notes (Signed)
[facility] DEPT Provider Note   CSN: [id number] Arrival date & time: 10/07/16  8546   An emergency department physician performed an initial assessment on this suspected stroke patient at 10.  History   Chief Complaint Chief Complaint  Patient presents with  . Aphasia    HPI Julia Floyd is a 83 y.o. female.  HPI  83yo female presents with acute onset of aphasia.  Triage called me regarding symptoms and agree with Code Stroke initiation.  Last known normal 6PM.  She was in the car with her daughter when she stopped talking, seemed to have difficulty speaking.  Question of facial droop.    Past Medical History:  Diagnosis Date  . DDD (degenerative disc disease)   . Depression   . Diabetes ([place])   . HTN (hypertension)   . Psychotic disorder    secondary to the below factors. 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 Bipolar disorder, most likely type 3, this episode being manic  . Sixth cranial nerve palsy   . Trigeminal neuralgia   . Uterine fibroid     Patient Active Problem List   Diagnosis Date Noted  . Stroke (cerebrum) ([place]) 10/07/2016  . HTN (hypertension)   . Diabetes ([place])   . Depression   . DDD (degenerative disc disease)   . Uterine fibroid   . Trigeminal neuralgia   . Sixth cranial nerve palsy     Past Surgical History:  Procedure Laterality Date  . BRONCHOSCOPY    . TRACHEOSTOMY      OB History    No data available       Home Medications    Prior to Admission medications   Medication Sig Start Date End Date Taking? Authorizing Provider  amLODipine (NORVASC) 5 MG tablet Take 1 tablet (5 mg total) by mouth daily. 07/09/16   Lawyer, Harrell Gave, PA-C  aspirin 325 MG tablet Take 325 mg by mouth daily.    [provider]  cyanocobalamin 500 MCG tablet Take 500 mcg by mouth. Vitamin B12    [provider]  furosemide (LASIX) 40 MG tablet Take 1 tablet (40 mg total) by mouth daily. 07/09/16   Lawyer,  Harrell Gave, PA-C  gabapentin (NEURONTIN) 100 MG capsule Take 100-200 mg by mouth at bedtime.    [provider]  metFORMIN (GLUCOPHAGE) 500 MG tablet Take 500-1,000 mg by mouth 2 (two) times daily with a meal.     [provider]    Family History Family History  Problem Relation Age of Onset  . Heart attack Unknown   . Diabetes Unknown     Social History Social History  Substance Use Topics  . Smoking status: Former Research scientist (life sciences)  . Smokeless tobacco: Never Used  . Alcohol use No     Allergies   Patient has no known allergies.   Review of Systems Review of Systems  Constitutional: Negative for fever.  Respiratory: Negative for cough and shortness of breath.   Cardiovascular: Negative for chest pain.  Gastrointestinal: Negative for abdominal pain, diarrhea, nausea and vomiting.  Neurological: Positive for facial asymmetry (mild) and speech difficulty. Negative for weakness, numbness and headaches.     Physical Exam Updated Vital Signs BP (!) 151/52 (BP Location: Right Arm)   Pulse 75   Temp (!) 97.4 F (36.3 C) (Oral)   Resp 15   Ht 5\' 6"  (1.676 m)   Wt 74.2 kg (163 lb 9.3 oz)   SpO2 100%   BMI 26.40 kg/m  Physical Exam  Constitutional: She appears well-developed and well-nourished. No distress.  HENT:  Head: Normocephalic and atraumatic.  Eyes: Conjunctivae and EOM are normal.  Neck: Normal range of motion.  Cardiovascular: Normal rate, regular rhythm, normal heart sounds and intact distal pulses.  Exam reveals no gallop and no friction rub.   No murmur heard. Pulmonary/Chest: Effort normal and breath sounds normal. No respiratory distress. She has no wheezes. She has no rales.  Abdominal: Soft. She exhibits no distension. There is no tenderness. There is no guarding.  Musculoskeletal: She exhibits no edema or tenderness.  Neurological: She is alert.  Skin: Skin is warm and dry. No rash noted. She is not diaphoretic. No erythema.  Nursing  note and vitals reviewed.    ED Treatments / Results  Labs (all labs ordered are listed, but only abnormal results are displayed) Labs Reviewed  CBC - Abnormal; Notable for the following:       Result Value   RBC 3.16 (*)    Hemoglobin 9.3 (*)    HCT 30.0 (*)    RDW 16.5 (*)    Platelets 123 (*)    All other components within normal limits  COMPREHENSIVE METABOLIC PANEL - Abnormal; Notable for the following:    Glucose, Bld 100 (*)    Albumin 3.3 (*)    ALT 7 (*)    GFR calc non Af Amer 57 (*)    All other components within normal limits  I-STAT CHEM 8, ED - Abnormal; Notable for the following:    Calcium, Ion 1.13 (*)    Hemoglobin 9.9 (*)    HCT 29.0 (*)    All other components within normal limits  PROTIME-INR  APTT  DIFFERENTIAL  I-STAT TROPOININ, ED  CBG MONITORING, ED  I-STAT CG4 LACTIC ACID, ED    EKG  EKG Interpretation None       Radiology Ct Head Code Stroke W/o Cm  Result Date: 10/07/2016 CLINICAL DATA:  Code stroke. Acute presentation with aphasia. Last seen normal 1 hour 20 minutes ago. EXAM: CT HEAD WITHOUT CONTRAST TECHNIQUE: Contiguous axial images were obtained from the base of the skull through the vertex without intravenous contrast. COMPARISON:  None. FINDINGS: Brain: Mild generalized brain atrophy. Mild chronic small-vessel changes of the deep white matter. No sign of acute infarction, mass lesion, hemorrhage, hydrocephalus or extra-axial collection. Vascular: There is atherosclerotic calcification of the major vessels at the base of the brain. Skull: Negative Sinuses/Orbits: Clear except for polyp or retention cyst in the left sphenoid sinus. Orbits negative. Other: None significant ASPECTS ([place] Stroke Program Early CT Score) - Ganglionic level infarction (caudate, lentiform nuclei, internal capsule, insula, M1-M3 cortex): 7 - Supraganglionic infarction (M4-M6 cortex): 3 Total score (0-10 with 10 being normal): 10 IMPRESSION: 1. Negative for  acute ischemic finding. Mild age related atrophy and chronic small vessel change of the white matter. 2. ASPECTS is 10 PA each placed on 10/07/2016  at 7:19 pm to Dr. Leonel Ramsay Electronically Signed   By: Nelson Chimes M.D.   On: 10/07/2016 19:20    Procedures Procedures (including critical care time)  Medications Ordered in ED Medications  alteplase (ACTIVASE) 1 mg/mL infusion 67 mg (67 mg Intravenous New Bag/Given 10/07/16 1933)    Followed by  0.9 %  sodium chloride infusion (not administered)  iopamidol (ISOVUE-370) 76 % injection (50 mLs  Contrast Given 10/07/16 1930)     Initial Impression / Assessment and Plan / ED Course  I have reviewed the  triage vital signs and the nursing notes.  Pertinent labs & imaging results that were available during my care of the patient were reviewed by me and considered in my medical decision making (see chart for details).    83yo female presents with concern for sudden onset aphasia.  Triage called me to initiate Code Stroke and agree with initiation by history.  Pt taken to CT. No acute findings. Dr. Leonel Ramsay was at bedside to evaluate patient, and I was evaluating another critical patient and not able to assess pt until after he had immediately assessed and initiated tPA.  Pt improving at time of my assessment, denies other symptoms. Taken to ICU for continued care.  Final Clinical Impressions(s) / ED Diagnoses   Final diagnoses:  Stroke (cerebrum) [facility])    New Prescriptions New Prescriptions   No medications on file     Gareth Morgan, MD 10/08/16 [phone]

## 2016-10-08 ENCOUNTER — Inpatient Hospital Stay (HOSPITAL_COMMUNITY): Payer: Medicare Other

## 2016-10-08 ENCOUNTER — Other Ambulatory Visit (HOSPITAL_COMMUNITY): Payer: Self-pay | Admitting: Radiology

## 2016-10-08 ENCOUNTER — Encounter (HOSPITAL_COMMUNITY): Payer: Self-pay | Admitting: *Deleted

## 2016-10-08 DIAGNOSIS — I6789 Other cerebrovascular disease: Secondary | ICD-10-CM

## 2016-10-08 DIAGNOSIS — I6300 Cerebral infarction due to thrombosis of unspecified precerebral artery: Secondary | ICD-10-CM

## 2016-10-08 LAB — ECHOCARDIOGRAM COMPLETE
CHL CUP MV DEC (S): 303
CHL CUP REG VEL DIAS: 107 cm/s
CHL CUP RV SYS PRESS: 43 mmHg
CHL CUP TV REG PEAK VELOCITY: 315 cm/s
E/e' ratio: 12.47
EWDT: 303 ms
FS: 27 % — AB (ref 28–44)
HEIGHTINCHES: 66 in
IV/PV OW: 1.3
LA diam end sys: 40 mm
LA vol A4C: 67.4 ml
LA vol: 80.7 mL
LADIAMINDEX: 2.18 cm/m2
LASIZE: 40 mm
LAVOLIN: 43.9 mL/m2
LDCA: 3.14 cm2
LV E/e' medial: 12.47
LV E/e'average: 12.47
LV TDI E'MEDIAL: 6.7
LV e' LATERAL: 8.1 cm/s
LVOT SV: 100 mL
LVOT VTI: 32 cm
LVOT peak grad rest: 7 mmHg
LVOTD: 20 mm
LVOTPV: 128 cm/s
Lateral S' vel: 17.3 cm/s
MV Peak grad: 4 mmHg
MV pk A vel: 127 m/s
MVPKEVEL: 101 m/s
PW: 11.2 mm — AB (ref 0.6–1.1)
TAPSE: 21.4 mm
TDI e' lateral: 8.1
TR max vel: 315 cm/s
WEIGHTICAEL: 2522.06 [oz_av]

## 2016-10-08 LAB — LIPID PANEL
Cholesterol: 154 mg/dL (ref 0–200)
HDL: 14 mg/dL — ABNORMAL LOW (ref >40)
LDL CALC: 87 mg/dL (ref 0–99)
Total CHOL/HDL Ratio: 11 RATIO
Triglycerides: 263 mg/dL — ABNORMAL HIGH (ref <150)
VLDL: 53 mg/dL — ABNORMAL HIGH (ref 0–40)

## 2016-10-08 LAB — TYPE AND SCREEN
ABO/RH(D): B POS
Antibody Screen: NEGATIVE

## 2016-10-08 LAB — ABO/RH: ABO/RH(D): B POS

## 2016-10-08 LAB — ETHANOL: Alcohol, Ethyl (B): <5 mg/dL (ref <5)

## 2016-10-08 MED ORDER — SODIUM CHLORIDE 0.9 % IV SOLN: Freq: Once | INTRAVENOUS | Status: AC

## 2016-10-08 MED ORDER — PERFLUTREN LIPID MICROSPHERE
1.0000 mL | INTRAVENOUS | Status: AC | PRN
  Administered 2016-10-08: 3 mL via INTRAVENOUS
  Filled 2016-10-08: qty 10

## 2016-10-08 MED ORDER — TRANEXAMIC ACID 1000 MG/10ML IV SOLN
1000.0000 mg | INTRAVENOUS | Status: AC
  Administered 2016-10-08: 1000 mg via INTRAVENOUS
  Filled 2016-10-08: qty 10

## 2016-10-08 MED ORDER — ORAL CARE MOUTH RINSE
15.0000 mL | Freq: Two times a day (BID) | OROMUCOSAL | Status: DC
  Administered 2016-10-09: 15 mL via OROMUCOSAL

## 2016-10-08 MED ORDER — HALOPERIDOL LACTATE 5 MG/ML IJ SOLN
0.5000 mg | Freq: Once | INTRAMUSCULAR | Status: AC
  Administered 2016-10-08: 0.5 mg via INTRAVENOUS
  Filled 2016-10-08: qty 1

## 2016-10-08 MED ORDER — CHLORHEXIDINE GLUCONATE 0.12 % MT SOLN
15.0000 mL | Freq: Two times a day (BID) | OROMUCOSAL | Status: DC
  Administered 2016-10-08 – 2016-10-12 (×10): 15 mL via OROMUCOSAL
  Filled 2016-10-08 (×6): qty 15

## 2016-10-08 NOTE — Progress Notes (Signed)
  Echocardiogram 2D Echocardiogram has been performed.  Blessin Kanno G Vilas Edgerly 10/08/2016, 11:47 AM

## 2016-10-08 NOTE — Progress Notes (Signed)
OT Cancellation Note  Patient Details Name: Julia Floyd MRN: [id number] DOB: 09/07/1930   Cancelled Treatment:    Reason Eval/Treat Not Completed: Patient not medically ready. Pt on strict bedrest. Please update activitiy orders when appropriate for OT. Thanks.   [place], OT/L  [phone] 10/08/2016 10/08/2016, 7:00 AM

## 2016-10-08 NOTE — Progress Notes (Signed)
New BP goal 120-140 per Dr Leonel Ramsay. Cardene started. Julia Floyd, Julia Marie, RN

## 2016-10-08 NOTE — Progress Notes (Signed)
MD notified of neuro change. Stat head ct to be done.

## 2016-10-08 NOTE — Progress Notes (Signed)
MD notified of change

## 2016-10-08 NOTE — Progress Notes (Signed)
CT shows multifocal hemorrahges, symptomatic in the left frontal region(aphasia). I have ordered cryoprecipitate and tranexemic acid.   Roland Rack, MD [facility] [phone]  If 7pm- 7am, please page neurology on call as listed in [place].

## 2016-10-08 NOTE — Progress Notes (Signed)
STROKE TEAM PROGRESS NOTE   HISTORY OF PRESENT ILLNESS (per record) Julia Floyd is a 83 y.o. female with a history of remote stroke, Alzheimer's dementia, trigeminal neuralgia, Type II diabetes mellitus, and hypertension, who presented with acute onset aphasia.  She was riding in the car with her daughter around 6 PM, and suddenly stopped talking, and was looking out of the car window, but would not respond to questions.  Her daughter noted right facial droop at onset, which has improved some since it began.  On arrival to the emergency department, could stroke was called.  She presented with aphasia, a mild flattening of right nasolabial fold, and an initial NIHSS of 7.  The patient received tPA at 1936 on 10/07/2016   She was hypertensive (193/74mmHg) on arrival to the Neuro ICU and was placed on a cardene gtt.  The patient's exam declined, a stat head CT showed hemorrhagic transformation with a diffuse distribution of micro-hemmorhage suggestive of underlying amyloid angiopathy.    LKW: 6 PM on 10/07/2016  Patient was administered IV t-PA at 1936 on 10/07/2016. She was admitted to the neuro ICU for further evaluation and treatment.   SUBJECTIVE (INTERVAL HISTORY) Her nurse is at the bedside.  The patient is awake and alert.  Confirmed DNR status with the patient's daughter.  Given the patient's advanced debility and dementia, the daughter prefers not to pursue aggressive testing and treatment.Follow-up CT scan of the head yesterday after neurological worsening showed multiple small microhemorrhages bilaterally post tPA   OBJECTIVE Temp:  [97.4 F (36.3 C)-100.2 F (37.9 C)] 100.2 F (37.9 C) (07/11 1200) Pulse Rate:  [67-124] 95 (07/11 1300) Cardiac Rhythm: Normal sinus rhythm (07/11 0800) Resp:  [12-26] 23 (07/11 1300) BP: (102-193)/(44-126) 102/67 (07/11 1300) SpO2:  [96 %-100 %] 96 % (07/11 1300) Weight:  [71.5 kg (157 lb 10.1 oz)-74.2 kg (163 lb 9.3 oz)] 71.5 kg (157 lb 10.1 oz)  (07/10 2200)  CBC:   Recent Labs Lab 10/07/16 1900 10/07/16 1923  WBC 5.5  --   NEUTROABS 2.6  --   HGB 9.3* 9.9*  HCT 30.0* 29.0*  MCV 94.9  --   PLT 123*  --     Basic Metabolic Panel:   Recent Labs Lab 10/07/16 1900 10/07/16 1923  NA 138 141  K 3.7 3.7  CL 106 105  CO2 24  --   GLUCOSE 100* 99  BUN 16 18  CREATININE 0.91 0.90  CALCIUM 9.1  --     Lipid Panel:     Component Value Date/Time   CHOL 154 10/08/2016 0417   TRIG 263 (H) 10/08/2016 0417   HDL 14 (L) 10/08/2016 0417   CHOLHDL 11.0 10/08/2016 0417   VLDL 53 (H) 10/08/2016 0417   LDLCALC 87 10/08/2016 0417   HgbA1c: No results found for: HGBA1C Urine Drug Screen: No results found for: LABOPIA, COCAINSCRNUR, LABBENZ, AMPHETMU, THCU, LABBARB  Alcohol Level No results found for: ETH  IMAGING  Ct Angio Head W and Wo Contrast 10/07/2016 IMPRESSION: No large or medium vessel occlusion identified. Atherosclerotic disease at both carotid bifurcation regions and ICA bulbs. 50% stenosis of the distal ICA bulb all on the right. Maximal stenosis of only 10% of the ICA on the left. Pronounced atherosclerotic disease of the left subclavian artery with maximal stenosis of 50%. 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 Reconstitution in the mid cervical region, giving supply to left PICA only. Dominant right vertebral artery widely patent to the  basilar. Atherosclerotic disease in both carotid siphon regions with stenosis estimated at 50% bilaterally. No intracranial occlusion or correctable stenosis identified beyond the siphon regions.   Ct Head Wo Contrast 10/08/2016 IMPRESSION: 1. Interval development of multifocal intraparenchymal hemorrhages status post tPA administration. No significant mass effect. 2. ASPECTS is 10   Ct Head Code Stroke W/o Cm 10/07/2016 IMPRESSION: 1. Negative for acute ischemic finding. Mild age related atrophy and chronic small vessel change of the white matter. 2. ASPECTS is 10       PHYSICAL EXAM  . Afebrile. Head is nontraumatic. Neck is supple without bruit.    Cardiac exam no murmur or gallop. Lungs are clear to auscultation. Distal pulses are well felt. Neurological Exam :  Awake alert disoriented. Speaks only occasional words but not sentences. Follows only occasional midline commands. Does not blink to threat bilaterally. No facial weakness. Tongue midline. Moves all 4 extremities well against gravity. No focal weakness. Tongue midline. Deep tendon reflexes symmetric. Plantars downgoing. Gait not tested. ASSESSMENT/PLAN Julia Floyd is a 83 y.o. female with history of remote stroke, Alzheimer's dementia, trigeminal neuralgia, Type II diabetes mellitus, and hypertension, who presents with acute onset aphasia. She received IV t-PA at 1936 on 10/07/2016.   Stroke/IPH:  unidentified primary lesion with post-tPA hemorrhagic conversion and subsequent diffuse intraparenchymal microhemorrhages  Resultant  confusion  CT head: No stroke  CTA head:  Atherosclerosis of bilateral ICAs, left subclavian artery with 50% stenosis, left vertebral artery origin occlusion, dominant right vertebral artery widely patent to the basilar. Atherosclerotic disease in both carotid siphon regions with stenosis estimated at 50% bilaterally.   2D Echo: grade 1 diastolic dysfunction, nodular thickening of the mitral valve anterior leaflet, no PFO, EF 60-65%    LDL 123  HgbA1c pending   SCDs for VTE prophylaxis Diet NPO time specified  aspirin 325 mg daily prior to admission, now on No antithrombotic: received tPA at 1936 on 10/07/2016  Patient counseled to be compliant with her antithrombotic medications  Ongoing aggressive stroke risk factor management  Therapy recommendations:  Pending   Disposition:   pending  Hypertension  Stable  Goal SBP > 120 and < 140 mmHg due to IPH   Long-term BP goal normotensive  Hyperlipidemia  Home meds:  none  LDL 123, goal <  70  Holding statin due to [place]  Diabetes  HgbA1c  pending, goal < 7.0  Controlled  Other Stroke Risk Factors  Advanced age  Overweight, Body mass index is 25.44 kg/m., recommend weight loss, diet and exercise as appropriate   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 She presented with aphasia secondary to suspected left MCA infarct and received IV TPA but after initial improvement showed neurological worsening and follow-up imaging shows multiple small post tPA hemorrhages she was treated with FFP and transxemic acid for TPA reversal.. The prognosis is guarded. I had a long discussion the bedside with patient's granddaughter who is quite realistic and they would like to continue present level of care for the next few days but did not want aggressive measures including panda tube, PEG tube, nursing home. Maintain strict control of blood pressure and close neurological monitoring  as per post TPA protocol. Repeat CT scan of the head in the morning. This patient is critically ill and at significant risk of neurological worsening, death and care requires constant monitoring of vital signs, hemodynamics,respiratory and cardiac monitoring, extensive review of multiple databases, frequent neurological assessment, discussion with family, other specialists and medical decision making of high complexity.I have made any additions or clarifications directly to the above note.This critical care time does not reflect procedure time, or teaching time or supervisory time of PA/NP/Med Resident etc but could involve care discussion time.  I spent 40 minutes of  neurocritical care time  in the care of  this patient.     Antony Contras, MD Medical Director [facility] Pager: [phone] 10/08/2016 4:31 PM   To contact Stroke Continuity provider, please refer to [email or web address] After hours, contact General Neurology

## 2016-10-08 NOTE — Progress Notes (Signed)
SLP Cancellation Note  Patient Details Name: VAANI MORREN MRN: [id number] DOB: [birth year]   Cancelled treatment:       Reason Eval/Treat Not Completed: Patient not medically ready. Per RN, MD wants SLP to hold evaluations until next date. Note that she initially passed her RN stroke swallow screen but that she has had a change in neuro status overnight with hemorrhaging noted on CT. Will f/u on next date.   Germain Osgood 10/08/2016, 1:55 PM  Germain Osgood, M.A. CCC-SLP [phone]

## 2016-10-08 NOTE — Progress Notes (Signed)
PT Cancellation Note  Patient Details Name: BRITTEN PARADY MRN: [id number] DOB: 1930-09-17   Cancelled Treatment:    Reason Eval/Treat Not Completed: Patient not medically ready. Pt with active Strict Bedrest orders. Please update activity orders when pt is appropriate for PT eval.    Thelma Comp 10/08/2016, 7:26 AM   Rolinda Roan, PT, DPT Acute Rehabilitation Services Pager: [phone]

## 2016-10-09 ENCOUNTER — Inpatient Hospital Stay (HOSPITAL_COMMUNITY): Payer: Medicare Other

## 2016-10-09 DIAGNOSIS — I619 Nontraumatic intracerebral hemorrhage, unspecified: Secondary | ICD-10-CM

## 2016-10-09 DIAGNOSIS — G936 Cerebral edema: Secondary | ICD-10-CM

## 2016-10-09 DIAGNOSIS — Z9282 Status post administration of tPA (rtPA) in a different facility within the last 24 hours prior to admission to current facility: Secondary | ICD-10-CM

## 2016-10-09 LAB — BPAM CRYOPRECIPITATE
Blood Product Expiration Date: 201807110625
Blood Product Expiration Date: 201807110625
ISSUE DATE / TIME: 201807110102
ISSUE DATE / TIME: 201807110214
UNIT TYPE AND RH: 5100
Unit Type and Rh: 5100

## 2016-10-09 LAB — PREPARE CRYOPRECIPITATE
UNIT DIVISION: 0
Unit division: 0

## 2016-10-09 LAB — HEMOGLOBIN A1C
HEMOGLOBIN A1C: 5.1 % (ref 4.8–5.6)
MEAN PLASMA GLUCOSE: 100 mg/dL

## 2016-10-09 MED ORDER — ORAL CARE MOUTH RINSE
15.0000 mL | Freq: Two times a day (BID) | OROMUCOSAL | Status: DC
  Administered 2016-10-09: 15 mL via OROMUCOSAL

## 2016-10-09 MED ORDER — FUROSEMIDE 40 MG PO TABS
40.0000 mg | ORAL_TABLET | Freq: Every day | ORAL | Status: DC
  Administered 2016-10-09 – 2016-10-14 (×6): 40 mg via ORAL
  Filled 2016-10-09 (×6): qty 1

## 2016-10-09 MED ORDER — CHLORHEXIDINE GLUCONATE 0.12 % MT SOLN: 15.0000 mL | Freq: Two times a day (BID) | OROMUCOSAL | Status: DC

## 2016-10-09 MED ORDER — AMLODIPINE BESYLATE 5 MG PO TABS
5.0000 mg | ORAL_TABLET | Freq: Every day | ORAL | Status: DC
  Administered 2016-10-09 – 2016-10-11 (×3): 5 mg via ORAL
  Filled 2016-10-09 (×3): qty 1

## 2016-10-09 MED ORDER — ORAL CARE MOUTH RINSE
15.0000 mL | Freq: Two times a day (BID) | OROMUCOSAL | Status: DC
  Administered 2016-10-09 – 2016-10-12 (×7): 15 mL via OROMUCOSAL

## 2016-10-09 MED ORDER — LABETALOL HCL 5 MG/ML IV SOLN: 20.0000 mg | INTRAVENOUS | Status: DC | PRN

## 2016-10-09 NOTE — Plan of Care (Signed)
Problem: Education: Goal: Knowledge of disease or condition will improve Outcome: Progressing Pt verbalized understanding of diagnosis of stroke and aware of being in hospital for tx.  Problem: Coping: Goal: Ability to identify appropriate support needs will improve Outcome: Progressing Family identifies need for care at home.  Problem: Self-Care: Goal: Ability to participate in self-care as condition permits will improve Outcome: Not Progressing Pt still dependent for ADLs. Goal: Ability to communicate needs accurately will improve Outcome: Progressing Pt able to communicate needs.  Problem: Nutrition: Goal: Risk of aspiration will decrease Outcome: Progressing SLP to assess pt today.  Problem: Intracerebral Hemorrhage Tissue Perfusion: Goal: Complications of Intracerebral Hemorrhage will be minimized Outcome: Progressing Pt neuro exam improving.  Problem: Activity: Goal: Risk for activity intolerance will decrease Outcome: Progressing PT/OT to evaluate pt today.

## 2016-10-09 NOTE — Progress Notes (Signed)
Medication readjusted, blood pressure rechecked at 2006 and within desired range.

## 2016-10-09 NOTE — Progress Notes (Signed)
PT Cancellation Note  Patient Details Name: Julia Floyd MRN: [id number] DOB: 1930-07-31   Cancelled Treatment:    Reason Eval/Treat Not Completed: Patient not medically ready. Pt with active Strict Bedrest orders. Please update activity orders when pt is appropriate for PT eval.    Thelma Comp 10/09/2016, 7:22 AM

## 2016-10-09 NOTE — Evaluation (Signed)
Speech Language Pathology Evaluation Patient Details Name: Julia Floyd MRN: [id number] DOB: 17-Aug-1930 Today's Date: 10/09/2016 Time: 1152-1205 SLP Time Calculation (min) (ACUTE ONLY): 13 min  Problem List:  Patient Active Problem List   Diagnosis Date Noted  . Received tissue plasminogen activator (t-PA) less than 24 hours prior to arrival 10/09/2016  . ICH (intracerebral hemorrhage) ([place]) 10/09/2016  . Cytotoxic brain edema ([place]) 10/09/2016  . Stroke (cerebrum) ([place]) -  unidentified primary lesion with post-tPA hemorrhagic conversion and subsequent diffuse intraparenchymal microhemorrhages 10/07/2016  . HTN (hypertension)   . Diabetes ([place])   . Depression   . DDD (degenerative disc disease)   . Uterine fibroid   . Trigeminal neuralgia   . Sixth cranial nerve palsy    Past Medical History:  Past Medical History:  Diagnosis Date  . DDD (degenerative disc disease)   . Depression   . Diabetes ([place])   . HTN (hypertension)   . Psychotic disorder    secondary to the below factors. 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 Bipolar disorder, most likely type 3, this episode being manic  . Sixth cranial nerve palsy   . Trigeminal neuralgia   . Uterine fibroid    Past Surgical History:  Past Surgical History:  Procedure Laterality Date  . BRONCHOSCOPY    . TRACHEOSTOMY     HPI:  Ptis an 83 y.o.femalewith a history of dementia, trigeminal neuralgia who presents with acute onset aphasia. Initial CT was negative for acute ischemic finding. Repeat CT s/p IV TPA showed interval development of multifocal intraparenchymal hemorrhages. Pt had a prior MBS in 2002 with SLP report not available; however, radiology report indicates penetration with all liquids and even purees. A chin tuck helped improve airway protection with honey thick liquids and purees only.   Assessment / Plan / Recommendation Clinical Impression  Pt presents with impaired awareness, attention,  and ability to complete one-step commands without cueing provided. She was responsive to questions but not very communicative with me otherwise, although her daughter said that she has been talking more throughout the day today. She believes that her mother is back at her cognitive and communicative baseline, and she does have a h/o dementia. SLP will f/u for swallowing goals only since pt's daughter does not believe she needs SLP f/u for cognitive status.     SLP Assessment  SLP Recommendation/Assessment: Patient does not need any further Speech Lanaguage Pathology Services SLP Visit Diagnosis: Cognitive communication deficit (R41.841)    Follow Up Recommendations  24 hour supervision/assistance    Frequency and Duration min 2x/week         SLP Evaluation Cognition  Overall Cognitive Status: History of cognitive impairments - at baseline Orientation Level: Oriented to person;Oriented to place;Oriented to time;Oriented to situation (pt states correct year, incorrect month)       Comprehension  Auditory Comprehension Overall Auditory Comprehension: Impaired at baseline    Expression Expression Primary Mode of Expression: Verbal Verbal Expression Overall Verbal Expression: Other (comment) (pt not communicating much, dtr says its been baseline though)   Oral / Motor  Motor Speech Overall Motor Speech: Appears within functional limits for tasks assessed   GO                    Germain Osgood 10/09/2016, 2:31 PM   Germain Osgood, M.A. CCC-SLP [phone]

## 2016-10-09 NOTE — Progress Notes (Addendum)
RN placed DNR bracelet on patient as she did not have one on, and explained what it was; patient stated "I am do not resuscitate? I never said this.  Can it be changed?  Granddaughter and great granddaughter and bedside explained pt told HCPOA she did not want to have chest compressions or be hooked up to a machine, but patient at present time does not recall. MD paged, and states will be at bedside to discuss code status with patient.    2215 MD discussed with patient and family at bedside; patient and family will continue to has not come to decision at this point on changing code status, patient still DNR, verbalizes understanding.

## 2016-10-09 NOTE — Progress Notes (Signed)
STROKE TEAM PROGRESS NOTE   HISTORY OF PRESENT ILLNESS (per record) Julia Floyd is a 83 y.o. female with a history of remote stroke, Alzheimer's dementia, trigeminal neuralgia, Type II diabetes mellitus, and hypertension, who presented with acute onset aphasia.  She was riding in the car with her daughter around 6 PM, and suddenly stopped talking, and was looking out of the car window, but would not respond to questions.  Her daughter noted right facial droop at onset, which has improved some since it began.  On arrival to the emergency department, could stroke was called.  She presented with aphasia, a mild flattening of right nasolabial fold, and an initial NIHSS of 7.  The patient received tPA at 1936 on 10/07/2016   She was hypertensive (193/48mmHg) on arrival to the Neuro ICU and was placed on a cardene gtt.  The patient's exam declined, a stat head CT showed hemorrhagic transformation with a diffuse distribution of micro-hemmorhage suggestive of underlying amyloid angiopathy.    LKW: 6 PM on 10/07/2016  Patient was administered IV t-PA at 1936 on 10/07/2016. She was admitted to the neuro ICU for further evaluation and treatment.   SUBJECTIVE (INTERVAL HISTORY) Her grand daughter is at the bedside.  The patient is awake and alert.   Her mental status appears to improved and she is interactive and speaking today. Repeat CT scan of the head this morning shows slight increase in size of all her multiple hemorrhages   OBJECTIVE Temp:  [98.2 F (36.8 C)-99.1 F (37.3 C)] 98.7 F (37.1 C) (07/12 0800) Pulse Rate:  [65-111] 69 (07/12 1230) Cardiac Rhythm: Normal sinus rhythm (07/12 0800) Resp:  [13-27] 18 (07/12 1230) BP: (114-165)/(42-98) 144/50 (07/12 1230) SpO2:  [99 %-100 %] 100 % (07/12 1230)  CBC:   Recent Labs Lab 10/07/16 1900 10/07/16 1923  WBC 5.5  --   NEUTROABS 2.6  --   HGB 9.3* 9.9*  HCT 30.0* 29.0*  MCV 94.9  --   PLT 123*  --     Basic Metabolic Panel:    Recent Labs Lab 10/07/16 1900 10/07/16 1923  NA 138 141  K 3.7 3.7  CL 106 105  CO2 24  --   GLUCOSE 100* 99  BUN 16 18  CREATININE 0.91 0.90  CALCIUM 9.1  --     Lipid Panel:     Component Value Date/Time   CHOL 154 10/08/2016 0417   TRIG 263 (H) 10/08/2016 0417   HDL 14 (L) 10/08/2016 0417   CHOLHDL 11.0 10/08/2016 0417   VLDL 53 (H) 10/08/2016 0417   LDLCALC 87 10/08/2016 0417   HgbA1c:  Lab Results  Component Value Date   HGBA1C 5.1 10/08/2016   Urine Drug Screen: No results found for: LABOPIA, COCAINSCRNUR, LABBENZ, AMPHETMU, THCU, LABBARB  Alcohol Level     Component Value Date/Time   ETH <5 10/08/2016 1417    IMAGING  Ct Angio Head W and Wo Contrast 10/07/2016 IMPRESSION: No large or medium vessel occlusion identified. Atherosclerotic disease at both carotid bifurcation regions and ICA bulbs. 50% stenosis of the distal ICA bulb all on the right. Maximal stenosis of only 10% of the ICA on the left. Pronounced atherosclerotic disease of the left subclavian artery with maximal stenosis of 50%. 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 Reconstitution in the mid cervical region, giving supply to left PICA only. Dominant right vertebral artery widely patent to the basilar. Atherosclerotic disease in both carotid siphon regions with stenosis  estimated at 50% bilaterally. No intracranial occlusion or correctable stenosis identified beyond the siphon regions.   Ct Head Wo Contrast 10/08/2016 IMPRESSION: 1. Interval development of multifocal intraparenchymal hemorrhages status post tPA administration. No significant mass effect. 2. ASPECTS is 10   Ct Head Code Stroke W/o Cm 10/07/2016 IMPRESSION: 1. Negative for acute ischemic finding. Mild age related atrophy and chronic small vessel change of the white matter. 2. ASPECTS is 10   CT Head wo 10/09/2016 ; 1. Interval blooming of multifocal intraparenchymal hemorrhages as detailed above, increased in size  relative to previous exam. Mild localized vasogenic edema without significant mass effect or midline shift. 2. No other new acute intracranial process.   PHYSICAL EXAM  . Afebrile. Head is nontraumatic. Neck is supple without bruit.    Cardiac exam no murmur or gallop. Lungs are clear to auscultation. Distal pulses are well felt. Neurological Exam :  Awake alert  Oriented x 2. Speaks short sentences. Follows  All commands. Does not blink to threat bilaterally. No facial weakness. Tongue midline. Moves all 4 extremities well against gravity. No focal weakness. Tongue midline. Deep tendon reflexes symmetric. Plantars downgoing. Gait not tested. ASSESSMENT/PLAN Ms. Julia Floyd is a 83 y.o. female with history of remote stroke, Alzheimer's dementia, trigeminal neuralgia, Type II diabetes mellitus, and hypertension, who presents with acute onset aphasia. She received IV t-PA at 1936 on 10/07/2016.   Stroke/IPH:  unidentified primary lesion with post-tPA hemorrhagic conversion and subsequent diffuse intraparenchymal microhemorrhages  Resultant  confusion  CT head: No stroke  CTA head:  Atherosclerosis of bilateral ICAs, left subclavian artery with 50% stenosis, left vertebral artery origin occlusion, dominant right vertebral artery widely patent to the basilar. Atherosclerotic disease in both carotid siphon regions with stenosis estimated at 50% bilaterally.   2D Echo: grade 1 diastolic dysfunction, nodular thickening of the mitral valve anterior leaflet, no PFO, EF 60-65%    LDL 123  HgbA1c 5.1  SCDs for VTE prophylaxis DIET - DYS 1 Room service appropriate? Yes; Fluid consistency: Thin  aspirin 325 mg daily prior to admission, now on No antithrombotic: received tPA at 1936 on 10/07/2016  Patient counseled to be compliant with her antithrombotic medications  Ongoing aggressive stroke risk factor management  Therapy recommendations:  Pending   Disposition:    pending  Hypertension  Stable  Goal SBP > 120 and < 140 mmHg due to IPH   Long-term BP goal normotensive  Hyperlipidemia  Home meds:  none  LDL 123, goal < 70  Holding statin due to [place]  Diabetes  HgbA1c  5.1, goal < 7.0  Controlled  Other Stroke Risk Factors  Advanced age  Overweight, Body mass index is 25.44 kg/m., recommend weight loss, diet and exercise as appropriate   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 She presented with aphasia secondary to suspected left MCA infarct and received IV TPA but after initial improvement showed neurological worsening and follow-up imaging shows multiple small post tPA hemorrhages she was treated with FFP and transxemic acid for TPA reversal.. . She is clinically improving despite slight radiological worsening of her hemorrhages on CT scan.. I had a long discussion the bedside with  patient's granddaughter  recommends patient to check swallow eval and resume home medications but hold aspirin. Mobilize out of bed. Transfer to the floor later today if stable. Therapy consults. She will likely need rehabilitation Maintain strict control of blood pressure and close neurological monitoring  . This patient is critically ill and at significant risk of neurological worsening, death and care requires constant monitoring of vital signs, hemodynamics,respiratory and cardiac monitoring, extensive review of multiple databases, frequent neurological assessment, discussion with family, other specialists and medical decision making of high complexity.I have made any additions or clarifications directly to the  above note.This critical care time does not reflect procedure time, or teaching time or supervisory time of PA/NP/Med Resident etc but could involve care discussion time.  I spent 30 minutes of neurocritical care time  in the care of  this patient.     Antony Contras, MD Medical Director [facility] Pager: [phone] 10/09/2016 1:07 PM   To contact Stroke Continuity provider, please refer to [email or web address] After hours, contact General Neurology

## 2016-10-09 NOTE — Evaluation (Signed)
Clinical/Bedside Swallow Evaluation Patient Details  Name: Julia Floyd MRN: [id number] Date of Birth: 10-Jan-1931  Today's Date: 10/09/2016 Time: SLP Start Time (ACUTE ONLY): 1142 SLP Stop Time (ACUTE ONLY): 1152 SLP Time Calculation (min) (ACUTE ONLY): 10 min  Past Medical History:  Past Medical History:  Diagnosis Date  . DDD (degenerative disc disease)   . Depression   . Diabetes ([place])   . HTN (hypertension)   . Psychotic disorder    secondary to the below factors. 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 Bipolar disorder, most likely type 3, this episode being manic  . Sixth cranial nerve palsy   . Trigeminal neuralgia   . Uterine fibroid    Past Surgical History:  Past Surgical History:  Procedure Laterality Date  . BRONCHOSCOPY    . TRACHEOSTOMY     HPI:  Ptis an 83 y.o.femalewith a history of dementia, trigeminal neuralgia who presents with acute onset aphasia. Initial CT was negative for acute ischemic finding. Repeat CT s/p IV TPA showed interval development of multifocal intraparenchymal hemorrhages. Pt had a prior MBS in 2002 with SLP report not available; however, radiology report indicates penetration with all liquids and even purees. A chin tuck helped improve airway protection with honey thick liquids and purees only.   Assessment / Plan / Recommendation Clinical Impression  Pt was asleep upon SLP arrival and required Mod stimulation to become alert and sustain alertness for PO trials. She consumed thin liquids and purees with no overt signs of aspiration. She does have prolonged oral transit with purees, taking 15-20 seconds per bolus for oral transfer. Recommend initiating Dys 1 diet and thin liquids with full supervision. SLP will f/u for tolerance and readiness to advance given improved alertness and swifter oral manipulation. SLP Visit Diagnosis: Dysphagia, oral phase (R13.11)    Aspiration Risk  Mild aspiration risk    Diet  Recommendation Dysphagia 1 (Puree);Thin liquid   Liquid Administration via: Straw;Cup Medication Administration: Crushed with puree Supervision: Staff to assist with self feeding;Full supervision/cueing for compensatory strategies Compensations: Minimize environmental distractions;Slow rate;Small sips/bites Postural Changes: Seated upright at 90 degrees    Other  Recommendations Oral Care Recommendations: Oral care BID   Follow up Recommendations 24 hour supervision/assistance      Frequency and Duration min 2x/week  2 weeks       Prognosis Prognosis for Safe Diet Advancement: Good Barriers to Reach Goals: Cognitive deficits      Swallow Study   General HPI: Ptis an 83 y.o.femalewith a history of dementia, trigeminal neuralgia who presents with acute onset aphasia. Initial CT was negative for acute ischemic finding. Repeat CT s/p IV TPA showed interval development of multifocal intraparenchymal hemorrhages. Pt had a prior MBS in 2002 with SLP report not available; however, radiology report indicates penetration with all liquids and even purees. A chin tuck helped improve airway protection with honey thick liquids and purees only. Type of Study: Bedside Swallow Evaluation Previous Swallow Assessment: see HPI Diet Prior to this Study: NPO Temperature Spikes Noted: Yes (100.2) Respiratory Status: Room air History of Recent Intubation: No Behavior/Cognition: Alert;Cooperative;Requires cueing Oral Cavity Assessment: Within Functional Limits Oral Care Completed by SLP: No Oral Cavity - Dentition: Edentulous Self-Feeding Abilities: Needs assist Patient Positioning: Upright in bed Baseline Vocal Quality: Normal Volitional Cough: Cognitively unable to elicit Volitional Swallow: Unable to elicit    Oral/Motor/Sensory Function     Ice Chips Ice chips: Not tested   Thin Liquid Thin Liquid: Within functional limits  Presentation: Straw    Nectar Thick Nectar Thick Liquid: Not  tested   Honey Thick Honey Thick Liquid: Not tested   Puree Puree: Impaired Presentation: Spoon Oral Phase Functional Implications: Prolonged oral transit   Solid   GO   Solid: Not tested        Germain Osgood 10/09/2016,2:19 PM  Germain Osgood, M.A. CCC-SLP [phone]

## 2016-10-10 NOTE — Progress Notes (Signed)
Responded to request from nurse when on unit to visit pt, who may wish to create advanced directive (AD). Provided emotional/spiritual support, ministry of presence, and prayer to pt and son and his spouse in rm -- which they, especially pt, appreciated. Discussed MC process for executing AD form and substance of possible decisions. Pt appeared to understand. Explained pt can consider form on wkend, ask a nurse to page for a chaplain at any time -- and can also take form w/ them and complete it post-d/c.   10/10/16 1500  Clinical Encounter Type  Visited With Patient and family together;Health care provider  Visit Type Initial;Psychological support;Spiritual support;Social support  Referral From Nurse  Spiritual Encounters  Spiritual Needs Brochure;Emotional  Stress Factors  Patient Stress Factors Health changes;Loss of control  Family Stress Factors Family relationships;Health changes;Loss of control   Gerrit Heck, Chaplain

## 2016-10-10 NOTE — Care Management Note (Signed)
Case Management Note  Patient Details  Name: Julia Floyd MRN: [id number] Date of Birth: 08-01-1930  Subjective/Objective:    Pt admitted with CVA. She is from home with family.                Action/Plan: OT recommending CIR. Awaiting PT recs. CM following for d/c disposition.   Expected Discharge Date:                  Expected Discharge Plan:  [place]  In-House Referral:     Discharge planning Services     Post Acute Care Choice:    Choice offered to:     DME Arranged:    DME Agency:     HH Arranged:    [place] Agency:     Status of Service:  In process, will continue to follow  If discussed at Long Length of Stay Meetings, dates discussed:    Additional Comments:  Pollie Friar, RN 10/10/2016, 11:24 AM

## 2016-10-10 NOTE — Evaluation (Signed)
Physical Therapy Evaluation Patient Details Name: Julia Floyd MRN: [id number] DOB: 26-Nov-1930 Today's Date: 10/10/2016   History of Present Illness  Pt is an 83 y.o. female with a history of remote stroke, Alzheimer's dementia, trigeminal neuralgia, Type II diabetes mellitus, and hypertension, who presented with acute onset aphasia. Pt found to have an unidentified primary lesion with post-tPA hemorrhagic conversion and subsequent diffuse intraparenchymal microhemorrhages.  Clinical Impression  Pt presented supine in bed with HOB elevated, awake and willing to participate in therapy session. Prior to admission, pt reported that she performed transfers from her bed to the [facility] or her w/c by herself, and required assistance from others for ADLs. Pt currently requires two person physical assistance for sit<>stand and stand-pivot transfers. Pt lives with her daughter and has excellent family support at home, as well as an aide that comes every morning for a couple of hours. Pt is very pleasant and agreeable to participate throughout. PT recommending further intensive therapy services in Inpatient Rehab to allow pt to return to independence with transfers prior to returning home. PT will continue to follow acutely.    Follow Up Recommendations CIR;Supervision/Assistance - 24 hour    Equipment Recommendations  None recommended by PT    Recommendations for Other Services Rehab consult     Precautions / Restrictions Precautions Precautions: Fall Restrictions Weight Bearing Restrictions: No      Mobility  Bed Mobility Overal bed mobility: Needs Assistance Bed Mobility: Supine to Sit     Supine to sit: Mod assist;HOB elevated     General bed mobility comments: min assist for trunk elevation, vc for hand placement/use of rails and sequencing, mod A with bed pad to scoot hips to EOB.   Transfers Overall transfer level: Needs assistance Equipment used: 2 person hand held  assist Transfers: Sit to/from [facility] Sit to Stand: Mod assist;+2 safety/equipment Stand pivot transfers: Max assist;+2 physical assistance       General transfer comment: increased time, cueing for sequencing, assist to power into standing and max A x2 for pivotal movements from bed to chair  Ambulation/Gait             General Gait Details: pt uses a w/c for mobility at baseline  Stairs            Wheelchair Mobility    Modified Rankin (Stroke Patients Only) Modified Rankin (Stroke Patients Only) Pre-Morbid Rankin Score: Severe disability Modified Rankin: Severe disability     Balance Overall balance assessment: Needs assistance Sitting-balance support: Bilateral upper extremity supported;Feet supported Sitting balance-Leahy Scale: Poor Sitting balance - Comments: able to sit independently EOB with BUE support after initial posterior lean   Standing balance support: Bilateral upper extremity supported Standing balance-Leahy Scale: Poor Standing balance comment: reliant on support from therapists                              Pertinent Vitals/Pain Pain Assessment: No/denies pain    Home Living Family/patient expects to be discharged to:: Private residence Living Arrangements: Children Available Help at Discharge: Family;Available 24 hours/day Type of Home: House Home Access: Ramped entrance     Home Layout: One level Home Equipment: Bedside commode;Tub bench;Grab bars - toilet;Wheelchair - power;Hospital bed Additional Comments: Pt has an aide that comes every morning for 2 hours to assist with ADL    Prior Function Level of Independence: Needs assistance   Gait / Transfers Assistance Needed: per family,  pt performed sit<>stand and stand-pivot transfers with mod I  ADL's / Homemaking Assistance Needed: assist with ADLs from aide that comes for 2 hours every morning and daughters, feeds self        Hand Dominance    Dominant Hand: Right    Extremity/Trunk Assessment   Upper Extremity Assessment Upper Extremity Assessment: Defer to OT evaluation    Lower Extremity Assessment Lower Extremity Assessment: Generalized weakness    Cervical / Trunk Assessment Cervical / Trunk Assessment: Kyphotic  Communication   Communication: No difficulties  Cognition Arousal/Alertness: Awake/alert Behavior During Therapy: WFL for tasks assessed/performed Overall Cognitive Status: History of cognitive impairments - at baseline                                 General Comments: pt with Alzheimer's dementia at baseline      General Comments General comments (skin integrity, edema, etc.): Pt's oldest daughter and grandaughter present for session    Exercises     Assessment/Plan    PT Assessment Patient needs continued PT services  PT Problem List Decreased strength;Decreased balance;Decreased mobility;Decreased coordination       PT Treatment Interventions DME instruction;Functional mobility training;Therapeutic exercise;Therapeutic activities;Balance training;Neuromuscular re-education;Patient/family education    PT Goals (Current goals can be found in the Care Plan section)  Acute Rehab PT Goals Patient Stated Goal: return to PLOF (independence with transfers) PT Goal Formulation: With patient/family Time For Goal Achievement: 10/24/16 Potential to Achieve Goals: Good    Frequency Min 4X/week   Barriers to discharge        Co-evaluation PT/OT/SLP Co-Evaluation/Treatment: Yes Reason for Co-Treatment: To address functional/ADL transfers;For patient/therapist safety;Necessary to address cognition/behavior during functional activity PT goals addressed during session: Mobility/safety with mobility;Balance;Strengthening/ROM OT goals addressed during session: ADL's and self-care       AM-PAC PT "6 Clicks" Daily Activity  Outcome Measure Difficulty turning over in bed (including  adjusting bedclothes, sheets and blankets)?: A Lot Difficulty moving from lying on back to sitting on the side of the bed? : Total Difficulty sitting down on and standing up from a chair with arms (e.g., wheelchair, bedside commode, etc,.)?: Total Help needed moving to and from a bed to chair (including a wheelchair)?: A Lot Help needed walking in hospital room?: Total Help needed climbing 3-5 steps with a railing? : Total 6 Click Score: 8    End of Session Equipment Utilized During Treatment: Gait belt Activity Tolerance: Patient tolerated treatment well Patient left: in chair;with call bell/phone within reach;with family/visitor present Nurse Communication: Mobility status PT Visit Diagnosis: Other abnormalities of gait and mobility (R26.89);Other symptoms and signs involving the nervous system (R29.898)    Time: 7672-0947 PT Time Calculation (min) (ACUTE ONLY): 23 min   Charges:   PT Evaluation $PT Eval Moderate Complexity: 1 Procedure     PT G Codes:        Sherie Don, PT, DPT [place] 10/10/2016, 11:35 AM

## 2016-10-10 NOTE — Evaluation (Signed)
Occupational Therapy Evaluation Patient Details Name: Julia Floyd MRN: [id number] DOB: [birth year] Today's Date: 10/10/2016    History of Present Illness Pt is a 83 y.o. female with a history of remote stroke, Alzheimer's dementia, trigeminal neuralgia, Type II diabetes mellitus, and hypertension, who presented with acute onset aphasia.   Clinical Impression   PTA Pt required mod A for ADL and had an aide coming for 2 hours every morning to assist. PTA Pt self-feeding. Pt performing stand pivot transfers (with a few steps holding onto furniture) independently. Pt is currently max A for ADL and max A +2 for stand pivot transfers. Pt at baseline cognitively and has excellent family support. Pt will require skilled OT in the acute setting and CIR level therapy to maximize safety and independence in ADL and functional transfers, caregiver education, and to return to PLOF. Next session to establish HEP and grooming tasks.     Follow Up Recommendations  CIR;Supervision/Assistance - 24 hour    Equipment Recommendations  Other (comment) (defer to next venue)    Recommendations for Other Services Rehab consult     Precautions / Restrictions Precautions Precautions: Fall Restrictions Weight Bearing Restrictions: No      Mobility Bed Mobility Overal bed mobility: Needs Assistance Bed Mobility: Supine to Sit     Supine to sit: Mod assist;HOB elevated     General bed mobility comments: min assist for trunk elevation, vc for hand placement/use of rails and sequencing, mod A with bed pad to scoot hips to EOB.   Transfers Overall transfer level: Needs assistance Equipment used: 2 person hand held assist Transfers: Sit to/from [facility] Sit to Stand: Mod assist;+2 physical assistance Stand pivot transfers: Max assist;+2 physical assistance            Balance Overall balance assessment: Needs assistance Sitting-balance support: Bilateral upper extremity  supported;Feet supported Sitting balance-Leahy Scale: Poor Sitting balance - Comments: able to sit independently EOB with BUE support after initial posterior lean   Standing balance support: Bilateral upper extremity supported Standing balance-Leahy Scale: Zero Standing balance comment: reliant on support from therapists                            ADL either performed or assessed with clinical judgement   ADL Overall ADL's : Needs assistance/impaired Eating/Feeding: Set up;Sitting   Grooming: Minimal assistance;Sitting   Upper Body Bathing: Moderate assistance   Lower Body Bathing: Moderate assistance   Upper Body Dressing : Minimal assistance   Lower Body Dressing: Maximal assistance   Toilet Transfer: Maximal assistance;+2 for physical assistance;Stand-pivot;RW Toilet Transfer Details (indicate cue type and reason): simulated through transfer to recliner Toileting- Clothing Manipulation and Hygiene: Total assistance Toileting - Clothing Manipulation Details (indicate cue type and reason): Pt currently using external catheter Tub/ Shower Transfer: Maximal assistance;+2 for physical assistance;Stand-pivot;Tub bench     General ADL Comments: Pt with siginificant decreased independence in transfers necessary for ADL     Vision Baseline Vision/History: Wears glasses Wears Glasses: Reading only Patient Visual Report: No change from baseline       Perception     Praxis      Pertinent Vitals/Pain Pain Assessment: No/denies pain     Hand Dominance Right   Extremity/Trunk Assessment Upper Extremity Assessment Upper Extremity Assessment: Overall WFL for tasks assessed;Generalized weakness   Lower Extremity Assessment Lower Extremity Assessment: Defer to PT evaluation   Cervical / Trunk Assessment Cervical / Trunk Assessment:  Kyphotic   Communication Communication Communication: No difficulties   Cognition Arousal/Alertness: Awake/alert Behavior During  Therapy: WFL for tasks assessed/performed Overall Cognitive Status: History of cognitive impairments - at baseline                                     General Comments  Pt's oldest daughter and grandaughter present for session    Exercises     Shoulder Instructions      Home Living Family/patient expects to be discharged to:: Private residence Living Arrangements: Children Available Help at Discharge: Family;Available 24 hours/day Type of Home: House Home Access: Ramped entrance     Home Layout: One level     Bathroom Shower/Tub: Tub/shower unit;Other (comment) (Pt taking sponge baths)   Bathroom Toilet: Standard     Home Equipment: Bedside commode;Tub bench;Grab bars - toilet;Wheelchair - power;Hospital bed   Additional Comments: Pt has an aide that comes every morning for 2 hours to assist with ADL  Lives With: Daughter    Prior Functioning/Environment Level of Independence: Needs assistance  Gait / Transfers Assistance Needed: transfers by herself supported by furniture to power wc ADL's / Homemaking Assistance Needed: assist in ADL from aide that comes fro 2 hours every morning and daughters, feeds self            OT Problem List: Decreased strength;Decreased activity tolerance;Impaired balance (sitting and/or standing);Decreased safety awareness      OT Treatment/Interventions: Self-care/ADL training;Therapeutic exercise;DME and/or AE instruction;Therapeutic activities;Patient/family education;Balance training    OT Goals(Current goals can be found in the care plan section) Acute Rehab OT Goals Patient Stated Goal: to get back to PLOF OT Goal Formulation: With patient Time For Goal Achievement: 10/24/16 Potential to Achieve Goals: Good ADL Goals Pt Will Transfer to Toilet: with supervision;bedside commode;stand pivot transfer Pt Will Perform Toileting - Clothing Manipulation and hygiene: with supervision;sit to/from stand Pt Will Perform  Tub/Shower Transfer: Tub transfer;with min assist;with caregiver independent in assisting;Stand pivot transfer;tub bench Pt/caregiver will Perform Home Exercise Program: Increased strength;Both right and left upper extremity;With written HEP provided Additional ADL Goal #1: Pt will perform bed mobility at supervision level prior to ADL routine  OT Frequency: Min 2X/week   Barriers to D/C:            Co-evaluation PT/OT/SLP Co-Evaluation/Treatment: Yes Reason for Co-Treatment: Necessary to address cognition/behavior during functional activity;To address functional/ADL transfers;For patient/therapist safety   OT goals addressed during session: ADL's and self-care      AM-PAC PT "6 Clicks" Daily Activity     Outcome Measure Help from another person eating meals?: None Help from another person taking care of personal grooming?: A Lot Help from another person toileting, which includes using toliet, bedpan, or urinal?: Total Help from another person bathing (including washing, rinsing, drying)?: A Lot Help from another person to put on and taking off regular upper body clothing?: A Little Help from another person to put on and taking off regular lower body clothing?: A Lot 6 Click Score: 14   End of Session Equipment Utilized During Treatment: Gait belt Nurse Communication: Mobility status;Other (comment) (catheter was removed fortransfer to recliner)  Activity Tolerance: Patient tolerated treatment well Patient left: in chair;with chair alarm set;with call bell/phone within reach;with family/visitor present;Other (comment) (MD's in room discussing with family)  OT Visit Diagnosis: Unsteadiness on feet (R26.81);Muscle weakness (generalized) (M62.81)  Time: 3710-6269 OT Time Calculation (min): 23 min Charges:  OT General Charges $OT Visit: 1 Procedure OT Evaluation $OT Eval Moderate Complexity: 1 Procedure G-Codes:     Hulda Humphrey OTR/L [place] 10/10/2016, 11:22 AM

## 2016-10-10 NOTE — Progress Notes (Signed)
STROKE TEAM PROGRESS NOTE   HISTORY OF PRESENT ILLNESS (per record) Zemirah Krasinski Delaney is a 83 y.o. female with a history of remote stroke, Alzheimer's dementia, trigeminal neuralgia, Type II diabetes mellitus, and hypertension, who presented with acute onset aphasia.  She was riding in the car with her daughter around 6 PM, and suddenly stopped talking, and was looking out of the car window, but would not respond to questions.  Her daughter noted right facial droop at onset, which has improved some since it began.  On arrival to the emergency department, could stroke was called.  She presented with aphasia, a mild flattening of right nasolabial fold, and an initial NIHSS of 7.  The patient received tPA at 1936 on 10/07/2016   She was hypertensive (193/31mmHg) on arrival to the Neuro ICU and was placed on a cardene gtt.  The patient's exam declined, a stat head CT showed hemorrhagic transformation with a diffuse distribution of micro-hemmorhage suggestive of underlying amyloid angiopathy.    LKW: 6 PM on 10/07/2016  Patient was administered IV t-PA at 1936 on 10/07/2016. She was admitted to the neuro ICU for further evaluation and treatment.   SUBJECTIVE (INTERVAL HISTORY) Her daughter and grand daughter are at the bedside.  The patient is awake and alert.   Her mental status continues to improve and she is interactive and speaking today. The family feels that the patient would want to withdraw her DO NOT RESUSCITATE status.   OBJECTIVE Temp:  [98.6 F (37 C)-99.5 F (37.5 C)] 98.6 F (37 C) (07/13 0757) Pulse Rate:  [65-84] 71 (07/13 0757) Cardiac Rhythm: Normal sinus rhythm (07/12 1504) Resp:  [16] 16 (07/13 0757) BP: (131-164)/(46-61) 164/61 (07/13 0757) SpO2:  [100 %] 100 % (07/13 0757)  CBC:   Recent Labs Lab 10/07/16 1900 10/07/16 1923  WBC 5.5  --   NEUTROABS 2.6  --   HGB 9.3* 9.9*  HCT 30.0* 29.0*  MCV 94.9  --   PLT 123*  --     Basic Metabolic Panel:   Recent  Labs Lab 10/07/16 1900 10/07/16 1923  NA 138 141  K 3.7 3.7  CL 106 105  CO2 24  --   GLUCOSE 100* 99  BUN 16 18  CREATININE 0.91 0.90  CALCIUM 9.1  --     Lipid Panel:     Component Value Date/Time   CHOL 154 10/08/2016 0417   TRIG 263 (H) 10/08/2016 0417   HDL 14 (L) 10/08/2016 0417   CHOLHDL 11.0 10/08/2016 0417   VLDL 53 (H) 10/08/2016 0417   LDLCALC 87 10/08/2016 0417   HgbA1c:  Lab Results  Component Value Date   HGBA1C 5.1 10/08/2016   Urine Drug Screen: No results found for: LABOPIA, COCAINSCRNUR, LABBENZ, AMPHETMU, THCU, LABBARB  Alcohol Level     Component Value Date/Time   ETH <5 10/08/2016 1417    IMAGING  Ct Angio Head W and Wo Contrast 10/07/2016 IMPRESSION: No large or medium vessel occlusion identified. Atherosclerotic disease at both carotid bifurcation regions and ICA bulbs. 50% stenosis of the distal ICA bulb all on the right. Maximal stenosis of only 10% of the ICA on the left. Pronounced atherosclerotic disease of the left subclavian artery with maximal stenosis of 50%. 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 Reconstitution in the mid cervical region, giving supply to left PICA only. Dominant right vertebral artery widely patent to the basilar. Atherosclerotic disease in both carotid siphon regions with stenosis estimated at  50% bilaterally. No intracranial occlusion or correctable stenosis identified beyond the siphon regions.   Ct Head Wo Contrast 10/08/2016 IMPRESSION: 1. Interval development of multifocal intraparenchymal hemorrhages status post tPA administration. No significant mass effect. 2. ASPECTS is 10   Ct Head Code Stroke W/o Cm 10/07/2016 IMPRESSION: 1. Negative for acute ischemic finding. Mild age related atrophy and chronic small vessel change of the white matter. 2. ASPECTS is 10   CT Head wo 10/09/2016 ; 1. Interval blooming of multifocal intraparenchymal hemorrhages as detailed above, increased in size relative to  previous exam. Mild localized vasogenic edema without significant mass effect or midline shift. 2. No other new acute intracranial process.   PHYSICAL EXAM  . Afebrile. Head is nontraumatic. Neck is supple without bruit.    Cardiac exam no murmur or gallop. Lungs are clear to auscultation. Distal pulses are well felt. Neurological Exam :  Awake alert  Oriented x 2. Speaks short sentences. Follows  All commands. Does  blink to threat bilaterally. No facial weakness. Tongue midline. Moves all 4 extremities well against gravity. No focal weakness. Tongue midline. Deep tendon reflexes symmetric. Plantars downgoing. Gait not tested. ASSESSMENT/PLAN Ms. SATOYA FEELEY is a 83 y.o. female with history of remote stroke, Alzheimer's dementia, trigeminal neuralgia, Type II diabetes mellitus, and hypertension, who presents with acute onset aphasia. She received IV t-PA at 1936 on 10/07/2016.   Stroke/IPH:  unidentified primary lesion with post-tPA hemorrhagic conversion and subsequent diffuse intraparenchymal microhemorrhages  Resultant  confusion  CT head: No stroke  CTA head:  Atherosclerosis of bilateral ICAs, left subclavian artery with 50% stenosis, left vertebral artery origin occlusion, dominant right vertebral artery widely patent to the basilar. Atherosclerotic disease in both carotid siphon regions with stenosis estimated at 50% bilaterally.   2D Echo: grade 1 diastolic dysfunction, nodular thickening of the mitral valve anterior leaflet, no PFO, EF 60-65%    LDL 123  HgbA1c 5.1  SCDs for VTE prophylaxis DIET DYS 3 Room service appropriate? Yes; Fluid consistency: Thin  aspirin 325 mg daily prior to admission, now on No antithrombotic: received tPA at 1936 on 10/07/2016  Patient counseled to be compliant with her antithrombotic medications  Ongoing aggressive stroke risk factor management  Therapy recommendations:  Pending   Disposition:    pending  Hypertension  Stable  Goal SBP > 120 and < 140 mmHg due to IPH   Long-term BP goal normotensive  Hyperlipidemia  Home meds:  none  LDL 123, goal < 70  Holding statin due to [place]  Diabetes  HgbA1c  5.1, goal < 7.0  Controlled  Other Stroke Risk Factors  Advanced age  Overweight, Body mass index is 25.44 kg/m., recommend weight loss, diet and exercise as appropriate   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 She presented with aphasia secondary to suspected left MCA infarct and received IV TPA but after initial improvement showed neurological worsening and follow-up imaging shows multiple small post tPA hemorrhages she was treated with FFP and transxemic acid for TPA reversal.. . She is clinically improving despite slight radiological worsening of her hemorrhages on CT scan.. I had a long discussion the bedside with patient's daughter and  granddaughter  . We discussed CODE STATUS and I assured them DO NOT RESUSCITATE does not mean no medical care. I do not believe the patient is fully competent to make that decision herself and has next of kin the daughter need to be monitored. The family would like to withdraw the DO NOT RESUSCITATE status for now but they will think about it and get back to [place] over the next 1-2 days. Mobilize out of bed.   Therapy consults. She will likely need rehabilitation Maintain strict control of blood pressure and close neurological monitoring  . Greater than 50% time during this 35 minute visit was spent on counseling and coordination of care about her stroke, post tPA hemorrhages, code and DO NOT  RESUSCITATE status discussion and answering questions      Antony Contras, MD Medical Director [place] Pager: [phone] 10/10/2016 1:54 PM   To contact Stroke Continuity provider, please refer to [email or web address] After hours, contact General Neurology

## 2016-10-10 NOTE — Care Management Important Message (Signed)
Important Message  Patient Details  Name: Julia Floyd MRN: [id number] Date of Birth: 1931-03-16   Medicare Important Message Given:  Yes    Keron Koffman [address] 10/10/2016, 1:47 PM

## 2016-10-10 NOTE — Progress Notes (Signed)
  Speech Language Pathology Treatment: Dysphagia  Patient Details Name: LACRESHA FUSILIER MRN: [id number] DOB: 28-Nov-1930 Today's Date: 10/10/2016 Time: 2440-1027 SLP Time Calculation (min) (ACUTE ONLY): 13 min  Assessment / Plan / Recommendation Clinical Impression  Pt is more alert today and consumed soft solids and thin liquids with swifter oral phase. She had mild oral residuals with soft solids, which cleared with Min cues for use of liquid washes. Pt and family say that she prefers slightly softer solids at home for ease of mastication. Recommend advancement to Dys 3 textures, continuing thin liquids. Will f/u briefly for tolerance.   HPI HPI: Ptis an 83 y.o.femalewith a history of dementia, trigeminal neuralgia who presents with acute onset aphasia. Initial CT was negative for acute ischemic finding. Repeat CT s/p IV TPA showed interval development of multifocal intraparenchymal hemorrhages. Pt had a prior MBS in 2002 with SLP report not available; however, radiology report indicates penetration with all liquids and even purees. A chin tuck helped improve airway protection with honey thick liquids and purees only.      SLP Plan  Continue with current plan of care       Recommendations  Diet recommendations: Dysphagia 3 (mechanical soft);Thin liquid Liquids provided via: Cup;Straw Medication Administration: Whole meds with liquid Supervision: Patient able to self feed;Intermittent supervision to cue for compensatory strategies Compensations: Minimize environmental distractions;Slow rate;Small sips/bites Postural Changes and/or Swallow Maneuvers: Seated upright 90 degrees                Oral Care Recommendations: Oral care BID Follow up Recommendations: 24 hour supervision/assistance SLP Visit Diagnosis: Dysphagia, unspecified (R13.10) Plan: Continue with current plan of care       GO                Germain Osgood 10/10/2016, 1:08 PM  Germain Osgood,  M.A. CCC-SLP [phone]

## 2016-10-10 NOTE — Progress Notes (Signed)
Rehab Admissions Coordinator Note:  Patient was screened by Cleatrice Burke for appropriateness for an Inpatient Acute Rehab Consult.  At this time, we are recommending Inpatient Rehab consult.  Cleatrice Burke 10/10/2016, 1:01 PM  I can be reached at [phone].

## 2016-10-11 DIAGNOSIS — Z9282 Status post administration of tPA (rtPA) in a different facility within the last 24 hours prior to admission to current facility: Secondary | ICD-10-CM

## 2016-10-11 DIAGNOSIS — I10 Essential (primary) hypertension: Secondary | ICD-10-CM

## 2016-10-11 MED ORDER — PANTOPRAZOLE SODIUM 40 MG PO TBEC
40.0000 mg | DELAYED_RELEASE_TABLET | Freq: Every day | ORAL | Status: DC
  Administered 2016-10-11 – 2016-10-14 (×4): 40 mg via ORAL
  Filled 2016-10-11 (×4): qty 1

## 2016-10-11 MED ORDER — AMLODIPINE BESYLATE 10 MG PO TABS
10.0000 mg | ORAL_TABLET | Freq: Every day | ORAL | Status: DC
  Administered 2016-10-12 – 2016-10-14 (×3): 10 mg via ORAL
  Filled 2016-10-11 (×3): qty 1

## 2016-10-11 MED ORDER — PRAVASTATIN SODIUM 40 MG PO TABS
40.0000 mg | ORAL_TABLET | Freq: Every day | ORAL | Status: DC
  Administered 2016-10-12 – 2016-10-14 (×3): 40 mg via ORAL
  Filled 2016-10-11 (×3): qty 1

## 2016-10-11 NOTE — Progress Notes (Signed)
STROKE TEAM PROGRESS NOTE   SUBJECTIVE (INTERVAL HISTORY) Her daughter is at the bedside.  The patient is awake and alert. As per daughter, she is wheelchair bound at home due to stroke 15 years ago. Also she had residue left arm weakness. Her aphasia has resolved this time.    OBJECTIVE Temp:  [98.2 F (36.8 C)-98.7 F (37.1 C)] 98.3 F (36.8 C) (07/14 0547) Pulse Rate:  [63-77] 70 (07/14 0547) Resp:  [16-20] 18 (07/14 0547) BP: (122-164)/(47-65) 122/56 (07/14 0547) SpO2:  [98 %-100 %] 98 % (07/14 0547)  CBC:   Recent Labs Lab 10/07/16 1900 10/07/16 1923  WBC 5.5  --   NEUTROABS 2.6  --   HGB 9.3* 9.9*  HCT 30.0* 29.0*  MCV 94.9  --   PLT 123*  --     Basic Metabolic Panel:   Recent Labs Lab 10/07/16 1900 10/07/16 1923  NA 138 141  K 3.7 3.7  CL 106 105  CO2 24  --   GLUCOSE 100* 99  BUN 16 18  CREATININE 0.91 0.90  CALCIUM 9.1  --     Lipid Panel:     Component Value Date/Time   CHOL 154 10/08/2016 0417   TRIG 263 (H) 10/08/2016 0417   HDL 14 (L) 10/08/2016 0417   CHOLHDL 11.0 10/08/2016 0417   VLDL 53 (H) 10/08/2016 0417   LDLCALC 87 10/08/2016 0417   HgbA1c:  Lab Results  Component Value Date   HGBA1C 5.1 10/08/2016   Urine Drug Screen: No results found for: LABOPIA, COCAINSCRNUR, LABBENZ, AMPHETMU, THCU, LABBARB  Alcohol Level     Component Value Date/Time   ETH <5 10/08/2016 1417    IMAGING I have personally reviewed the radiological images below and agree with the radiology interpretations.  Ct Angio Head and neck W and Wo Contrast 10/07/2016 IMPRESSION: No large or medium vessel occlusion identified. Atherosclerotic disease at both carotid bifurcation regions and ICA bulbs. 50% stenosis of the distal ICA bulb all on the right. Maximal stenosis of only 10% of the ICA on the left. Pronounced atherosclerotic disease of the left subclavian artery with maximal stenosis of 50%. 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  Reconstitution in the mid cervical region, giving supply to left PICA only. Dominant right vertebral artery widely patent to the basilar. Atherosclerotic disease in both carotid siphon regions with stenosis estimated at 50% bilaterally. No intracranial occlusion or correctable stenosis identified beyond the siphon regions.   Ct Head Wo Contrast 10/08/2016 IMPRESSION: 1. Interval development of multifocal intraparenchymal hemorrhages status post tPA administration. No significant mass effect. 2. ASPECTS is 10   Ct Head Code Stroke W/o Cm 10/07/2016 IMPRESSION: 1. Negative for acute ischemic finding. Mild age related atrophy and chronic small vessel change of the white matter. 2. ASPECTS is 10   CT Head wo 10/09/2016 ; 1. Interval blooming of multifocal intraparenchymal hemorrhages as detailed above, increased in size relative to previous exam. Mild localized vasogenic edema without significant mass effect or midline shift. 2. No other new acute intracranial process.  MRI and MRV pending   PHYSICAL EXAM  Temp:  [98.3 F (36.8 C)-99.2 F (37.3 C)] 99.2 F (37.3 C) (07/14 1832) Pulse Rate:  [63-77] 71 (07/14 1832) Resp:  [16-20] 18 (07/14 1832) BP: (122-155)/(47-56) 155/53 (07/14 1832) SpO2:  [98 %-100 %] 99 % (07/14 1832)  General - Well nourished, well developed, in no apparent distress.  Ophthalmologic - Fundi not visualized due to noncooperation.  Cardiovascular -  Regular rate and rhythm.  Mental Status -  Level of arousal and orientation to year, place, and person were intact, not orientated to month Language including expression, naming, repetition, comprehension was assessed and found intact. Fund of Knowledge was assessed and was intact.  Cranial Nerves II - XII - II - Visual field intact OU. III, IV, VI - Extraocular movements intact. V - Facial sensation intact bilaterally. VII - Facial movement intact bilaterally. VIII - Hearing & vestibular intact bilaterally. X -  Palate elevates symmetrically. XI - Chin turning & shoulder shrug intact bilaterally. XII - Tongue protrusion intact.  Motor Strength - The patient's strength was 5/5 RUE, 5-/5 LUE, 2+/5 BLE and pronator drift was present on the left (all chronic).  Bulk was normal and fasciculations were absent.   Motor Tone - Muscle tone was assessed at the neck and appendages and was normal.  Reflexes - The patient's reflexes were 1+ in all extremities and she had no pathological reflexes.  Sensory - Light touch, temperature/pinprick were assessed and were symmetrical.    Coordination - The patient had normal movements in the hands with no ataxia or dysmetria.  Tremor was absent.  Gait and Station - not tested.   ASSESSMENT/PLAN Ms. JORY WELKE is a 83 y.o. female with history of remote stroke, Alzheimer's dementia, trigeminal neuralgia, Type II diabetes mellitus, and hypertension, who presents with acute onset aphasia. She received IV t-PA at 1936 on 10/07/2016.   Stroke or TIA s/p tPA with multifocal ICH - etiology unclear, CAA hematoma vs. CVT vs. hemorrhagic transformation  Resultant  Confusion and aphasia resolved  CT head: No stroke  CTA head:  Atherosclerosis of bilateral ICAs, left subclavian artery with 50% stenosis, left vertebral artery origin occlusion.  2D Echo: EF 60-65%   MRI and MRV pending  LDL 123  HgbA1c 5.1  SCDs for VTE prophylaxis DIET DYS 3 Room service appropriate? Yes; Fluid consistency: Thin  aspirin 325 mg daily prior to admission, now on No antithrombotic due to [place]  Patient counseled to be compliant with her antithrombotic medications  Ongoing aggressive stroke risk factor management  Therapy recommendations:  Pending   Disposition:   pending  Hypertension  Stable  Goal SBP < 160 mmHg due to IPH   Long-term BP goal normotensive  Hyperlipidemia  Home meds:  none  LDL 123, goal < 70  Add pravastatin 40  Continue on  discharge  Diabetes  HgbA1c  5.1, goal < 7.0  Controlled  Other Stroke Risk Factors  Advanced age  Hx of stroke 15 years ago with residue wheelchair bound and left UE mild weakness  Other Active Problems  Wheelchair bound at home PTA  TN  Daughter denies any dementia at home  Hospital day # 4  [name], MD PhD Stroke Neurology 10/11/2016 9:05 PM   To contact Stroke Continuity provider, please refer to [email or web address] After hours, contact General Neurology

## 2016-10-11 NOTE — Clinical Social Work Note (Signed)
Clinical Social Work Assessment  Patient Details  Name: Julia Floyd MRN: [id number] Date of Birth: 09/23/1930  Date of referral:  10/11/16               Reason for consult:  Facility Placement, Discharge Planning                Permission sought to share information with:  Facility Sport and exercise psychologist, Family Supports Permission granted to share information::  Yes, Verbal Permission Granted  Name::     Paediatric nurse::  SNF  Relationship::  Administrator Information:     Housing/Transportation Living arrangements for the past 2 months:  Single Family Home Source of Information:  Patient, Other (Comment Required) (Granddaughter) Patient Interpreter Needed:  None Criminal Activity/Legal Involvement Pertinent to Current Situation/Hospitalization:  No - Comment as needed Significant Relationships:  Adult Children, Other Family Members Lives with:  Self, Adult Children Do you feel safe going back to the place where you live?  Yes Need for family participation in patient care:  Yes (Comment) (patient currently not fully oriented)  Care giving concerns:  Patient currently resides at home with her daughter, but needs a short rehabilitation placement to regain strength prior to returning home in order to successfully complete ADLs.   Social Worker assessment / plan:  CSW introduced self to patient and patient's granddaughter, Julia Floyd, at bedside. CSW explained recommendation for CIR and referral for SNF as a secondary option in case admission to CIR doesn't happen. Patient and patient's granddaughter indicated familiarity with what SNF placement meant, and that they would be willing to pursue SNF placement as a secondary option. CSW to fax out referral and follow up with bed offers, if needed.  Employment status:  Retired Nurse, adult PT Recommendations:  Inpatient [facility] / Referral to community resources:  [place]  Patient/Family's Response to care:  Patient and patient's granddaughter agreeable to SNF placement as back-up to CIR placement.  Patient/Family's Understanding of and Emotional Response to Diagnosis, Current Treatment, and Prognosis:  Patient and patient's granddaughter seem to understand the patient's current need for additional care and support at discharge. Patient and patient's granddaughter indicated understanding of CSW role in discharge planning.  Emotional Assessment Appearance:  Appears stated age Attitude/Demeanor/Rapport:    Affect (typically observed):  Appropriate Orientation:  Oriented to Self, Oriented to Place, Oriented to Situation Alcohol / Substance use:  Not Applicable Psych involvement (Current and /or in the community):  No (Comment)  Discharge Needs  Concerns to be addressed:  Care Coordination, Discharge Planning Concerns Readmission within the last 30 days:  No Current discharge risk:  Physical Impairment Barriers to Discharge:  Continued Medical Work up   [employer], [place] 10/11/2016, 12:18 PM

## 2016-10-11 NOTE — Progress Notes (Signed)
Occupational Therapy Treatment Patient Details Name: Julia Floyd MRN: [id number] DOB: 1930-12-08 Today's Date: 10/11/2016    History of present illness Pt is an 83 y.o. female with a history of remote stroke, Alzheimer's dementia, trigeminal neuralgia, Type II diabetes mellitus, and hypertension, who presented with acute onset aphasia. Pt found to have an unidentified primary lesion with post-tPA hemorrhagic conversion and subsequent diffuse intraparenchymal microhemorrhages.   OT comments  Pt making progress towards OT goals this session. Focus was on Pt's ability to perform transfers for ADL. Lift equipment used this session for safety of Pt and staff. Pt assisted with sit to stand and was able to hold standing to build up stamina and strength during transfer. Pt continues to require skilled OT in the acute setting and will benefit from CIR level therapy to return to PLOF.   Follow Up Recommendations  CIR;Supervision/Assistance - 24 hour    Equipment Recommendations  Other (comment) (defer to next venue)    Recommendations for Other Services Rehab consult    Precautions / Restrictions Precautions Precautions: Fall Restrictions Weight Bearing Restrictions: No       Mobility Bed Mobility Overal bed mobility: Needs Assistance Bed Mobility: Sit to Supine     Supine to sit: Mod assist;HOB elevated Sit to supine: Max assist   General bed mobility comments: max A for assist with BLE back into bed, and then +2 for scooting in bed  Transfers Overall transfer level: Needs assistance Equipment used: 2 person hand held assist Transfers: Sit to/from Stand Sit to Stand: +2 safety/equipment;Max assist         General transfer comment: use of STEDY, use of bedpad to assist Pt in boost    Balance Overall balance assessment: Needs assistance Sitting-balance support: Bilateral upper extremity supported;Feet supported Sitting balance-Leahy Scale: Poor Sitting balance -  Comments: able to sit independently EOB with BUE support after initial posterior lean   Standing balance support: Bilateral upper extremity supported Standing balance-Leahy Scale: Poor Standing balance comment: reliant on support from staff/STEDY                           ADL either performed or assessed with clinical judgement   ADL Overall ADL's : Needs assistance/impaired                         Toilet Transfer: +2 for physical assistance;+2 for safety/equipment;Maximal assistance (STEDY used for Pt and therapist safety, NT assisting) Toilet Transfer Details (indicate cue type and reason): simulated through transfer to bed Toileting- Clothing Manipulation and Hygiene:  (Pt currently using external female catheter)         General ADL Comments: Pt with siginificant decreased independence in transfers necessary for ADL     Vision       Perception     Praxis      Cognition Arousal/Alertness: Awake/alert Behavior During Therapy: WFL for tasks assessed/performed Overall Cognitive Status: History of cognitive impairments - at baseline                                 General Comments: pt with Alzheimer's dementia at baseline        Exercises     Shoulder Instructions       General Comments Pt's daughter present, nurse tech providing assist    Pertinent Vitals/ Pain  Pain Assessment: No/denies pain  Home Living                                          Prior Functioning/Environment              Frequency  Min 2X/week        Progress Toward Goals  OT Goals(current goals can now be found in the care plan section)  Progress towards OT goals: Progressing toward goals  Acute Rehab OT Goals Patient Stated Goal: return to PLOF (independence with transfers) OT Goal Formulation: With patient Time For Goal Achievement: 10/24/16 Potential to Achieve Goals: Good  Plan Discharge plan remains  appropriate;Frequency remains appropriate    Co-evaluation                 AM-PAC PT "6 Clicks" Daily Activity     Outcome Measure   Help from another person eating meals?: None Help from another person taking care of personal grooming?: A Lot Help from another person toileting, which includes using toliet, bedpan, or urinal?: Total Help from another person bathing (including washing, rinsing, drying)?: A Lot Help from another person to put on and taking off regular upper body clothing?: A Little Help from another person to put on and taking off regular lower body clothing?: A Lot 6 Click Score: 14    End of Session Equipment Utilized During Treatment: Gait belt;Other (comment) (STEDY)  OT Visit Diagnosis: Unsteadiness on feet (R26.81);Muscle weakness (generalized) (M62.81)   Activity Tolerance Patient tolerated treatment well   Patient Left in bed;with call bell/phone within reach;with bed alarm set;with family/visitor present;with nursing/sitter in room   Nurse Communication Mobility status        Time: 7867-5449 OT Time Calculation (min): 16 min  Charges: OT General Charges $OT Visit: 1 Procedure OT Treatments $Self Care/Home Management : 8-22 mins  Hulda Humphrey OTR/L [place] 10/11/2016, 4:39 PM

## 2016-10-11 NOTE — NC FL2 (Signed)
[place] MEDICAID FL2 LEVEL OF CARE SCREENING TOOL     IDENTIFICATION  Patient Name: Julia Floyd Birthdate: [birth year] Sex: female Admission Date (Current Location): 10/07/2016  [facility] [place] Number:  Herbalist and Address:  [facility], [place] [address] [place], [place] [address]      Provider Number: [id number]  Attending Physician Name and Address:  [name], MD  Relative Name and Phone Number:       Current Level of Care: Hospital Recommended Level of Care: [place] Prior Approval Number:    Date Approved/Denied:   PASRR Number: [id number]  Discharge Plan: SNF    Current Diagnoses: Patient Active Problem List   Diagnosis Date Noted  . Received tissue plasminogen activator (t-PA) less than 24 hours prior to arrival 10/09/2016  . ICH (intracerebral hemorrhage) ([place]) 10/09/2016  . Cytotoxic brain edema ([place]) 10/09/2016  . Stroke (cerebrum) ([place]) -  unidentified primary lesion with post-tPA hemorrhagic conversion and subsequent diffuse intraparenchymal microhemorrhages 10/07/2016  . HTN (hypertension)   . Diabetes ([place])   . Depression   . DDD (degenerative disc disease)   . Uterine fibroid   . Trigeminal neuralgia   . Sixth cranial nerve palsy     Orientation RESPIRATION BLADDER Height & Weight     Self, Situation, Place  Normal Incontinent, External catheter Weight: 157 lb 10.1 oz (71.5 kg) Height:  5\' 6"  (167.6 cm)  BEHAVIORAL SYMPTOMS/MOOD NEUROLOGICAL BOWEL NUTRITION STATUS      Continent    AMBULATORY STATUS COMMUNICATION OF NEEDS Skin   Extensive Assist Verbally Normal                       Personal Care Assistance Level of Assistance  Bathing, Dressing Bathing Assistance: Maximum assistance   Dressing Assistance: Maximum assistance     Functional Limitations Info             SPECIAL CARE FACTORS FREQUENCY  PT (By licensed PT), OT (By licensed OT)     PT Frequency:  5x/wk OT Frequency: 5x/wk            Contractures      Additional Factors Info  Code Status, Allergies Code Status Info: Full Allergies Info: NKA           Current Medications (10/11/2016):  This is the current hospital active medication list Current Facility-Administered Medications  Medication Dose Route Frequency Provider Last Rate Last Dose  . 0.9 %  sodium chloride infusion   Intravenous Continuous Greta Doom, MD 75 mL/hr at 10/10/16 2337    . acetaminophen (TYLENOL) tablet 650 mg  650 mg Oral Q4H PRN Greta Doom, MD       Or  . acetaminophen (TYLENOL) solution 650 mg  650 mg Per Tube Q4H PRN Greta Doom, MD       Or  . acetaminophen (TYLENOL) suppository 650 mg  650 mg Rectal Q4H PRN Greta Doom, MD      . amLODipine (NORVASC) tablet 5 mg  5 mg Oral Daily Patteson, Samuel A, NP   5 mg at 10/11/16 0930  . chlorhexidine (PERIDEX) 0.12 % solution 15 mL  15 mL Mouth Rinse BID Greta Doom, MD   15 mL at 10/11/16 0930  . furosemide (LASIX) tablet 40 mg  40 mg Oral Daily Patteson, Samuel A, NP   40 mg at 10/11/16 0930  . labetalol (NORMODYNE,TRANDATE) injection 20 mg  20 mg Intravenous Q4H PRN Patteson, Samuel A, NP      . MEDLINE mouth rinse  15 mL Mouth Rinse q12n4p Hammons, Kimberly B, RPH   15 mL at 10/11/16 1227  . pantoprazole (PROTONIX) injection 40 mg  40 mg Intravenous QHS Greta Doom, MD   40 mg at 10/10/16 2228     Discharge Medications: Please see discharge summary for a list of discharge medications.  Relevant Imaging Results:  Relevant Lab Results:   Additional Information SS#: [id number]  Geralynn Ochs, LCSW

## 2016-10-12 ENCOUNTER — Encounter (HOSPITAL_COMMUNITY): Payer: Self-pay

## 2016-10-12 ENCOUNTER — Inpatient Hospital Stay (HOSPITAL_COMMUNITY): Payer: Medicare Other

## 2016-10-12 LAB — BASIC METABOLIC PANEL
Anion gap: 4 — ABNORMAL LOW (ref 5–15)
BUN: 5 mg/dL — AB (ref 6–20)
CALCIUM: 8.5 mg/dL — AB (ref 8.9–10.3)
CHLORIDE: 109 mmol/L (ref 101–111)
CO2: 26 mmol/L (ref 22–32)
CREATININE: 0.72 mg/dL (ref 0.44–1.00)
GFR calc non Af Amer: >60 mL/min (ref >60)
Glucose, Bld: 101 mg/dL — ABNORMAL HIGH (ref 65–99)
Potassium: 3.4 mmol/L — ABNORMAL LOW (ref 3.5–5.1)
Sodium: 139 mmol/L (ref 135–145)

## 2016-10-12 LAB — CBC
HEMATOCRIT: 26.8 % — AB (ref 36.0–46.0)
HEMOGLOBIN: 8.2 g/dL — AB (ref 12.0–15.0)
MCH: 29.1 pg (ref 26.0–34.0)
MCHC: 30.6 g/dL (ref 30.0–36.0)
MCV: 95 fL (ref 78.0–100.0)
Platelets: 127 10*3/uL — ABNORMAL LOW (ref 150–400)
RBC: 2.82 MIL/uL — ABNORMAL LOW (ref 3.87–5.11)
RDW: 16.3 % — AB (ref 11.5–15.5)
WBC: 5.5 10*3/uL (ref 4.0–10.5)

## 2016-10-12 MED ORDER — GADOBENATE DIMEGLUMINE 529 MG/ML IV SOLN
15.0000 mL | Freq: Once | INTRAVENOUS | Status: AC
  Administered 2016-10-12: 15 mL via INTRAVENOUS

## 2016-10-12 NOTE — Progress Notes (Signed)
STROKE TEAM PROGRESS NOTE   SUBJECTIVE (INTERVAL HISTORY) Her daughter is at the bedside.  The patient is awake and alert. She is sitting for lunch. However, MRI not done yet. No acute event overnight.    OBJECTIVE Temp:  [98.2 F (36.8 C)-99.2 F (37.3 C)] 98.2 F (36.8 C) (07/15 0512) Pulse Rate:  [58-77] 58 (07/15 0512) Resp:  [16-20] 20 (07/15 0512) BP: (143-155)/(48-55) 150/49 (07/15 0512) SpO2:  [99 %-100 %] 100 % (07/15 0512)  CBC:   Recent Labs Lab 10/07/16 1900 10/07/16 1923 10/12/16 0403  WBC 5.5  --  5.5  NEUTROABS 2.6  --   --   HGB 9.3* 9.9* 8.2*  HCT 30.0* 29.0* 26.8*  MCV 94.9  --  95.0  PLT 123*  --  127*    Basic Metabolic Panel:   Recent Labs Lab 10/07/16 1900 10/07/16 1923 10/12/16 0403  NA 138 141 139  K 3.7 3.7 3.4*  CL 106 105 109  CO2 24  --  26  GLUCOSE 100* 99 101*  BUN 16 18 5*  CREATININE 0.91 0.90 0.72  CALCIUM 9.1  --  8.5*    Lipid Panel:     Component Value Date/Time   CHOL 154 10/08/2016 0417   TRIG 263 (H) 10/08/2016 0417   HDL 14 (L) 10/08/2016 0417   CHOLHDL 11.0 10/08/2016 0417   VLDL 53 (H) 10/08/2016 0417   LDLCALC 87 10/08/2016 0417   HgbA1c:  Lab Results  Component Value Date   HGBA1C 5.1 10/08/2016   Urine Drug Screen: No results found for: LABOPIA, COCAINSCRNUR, LABBENZ, AMPHETMU, THCU, LABBARB  Alcohol Level     Component Value Date/Time   ETH <5 10/08/2016 1417    IMAGING I have personally reviewed the radiological images below and agree with the radiology interpretations.  Ct Angio Head and neck W and Wo Contrast 10/07/2016 IMPRESSION: No large or medium vessel occlusion identified. Atherosclerotic disease at both carotid bifurcation regions and ICA bulbs. 50% stenosis of the distal ICA bulb all on the right. Maximal stenosis of only 10% of the ICA on the left. Pronounced atherosclerotic disease of the left subclavian artery with maximal stenosis of 50%. Occlusion of the left vertebral artery at its  origin. Reconstitution in the mid cervical region, giving supply to left PICA only. Dominant right vertebral artery widely patent to the basilar. Atherosclerotic disease in both carotid siphon regions with stenosis estimated at 50% bilaterally. No intracranial occlusion or correctable stenosis identified beyond the siphon regions.   Ct Head Wo Contrast 10/08/2016 IMPRESSION: 1. Interval development of multifocal intraparenchymal hemorrhages status post tPA administration. No significant mass effect. 2. ASPECTS is 10   Ct Head Code Stroke W/o Cm 10/07/2016 IMPRESSION: 1. Negative for acute ischemic finding. Mild age related atrophy and chronic small vessel change of the white matter. 2. ASPECTS is 10   CT Head wo 10/09/2016 ; 1. Interval blooming of multifocal intraparenchymal hemorrhages as detailed above, increased in size relative to previous exam. Mild localized vasogenic edema without significant mass effect or midline shift. 2. No other new acute intracranial process.  MRI and MRV pending   PHYSICAL EXAM  Temp:  [98.2 F (36.8 C)-99.2 F (37.3 C)] 98.2 F (36.8 C) (07/15 0512) Pulse Rate:  [58-77] 58 (07/15 0512) Resp:  [16-20] 20 (07/15 0512) BP: (143-155)/(48-55) 150/49 (07/15 0512) SpO2:  [99 %-100 %] 100 % (07/15 0512)  General - Well nourished, well developed, in no apparent distress.  Ophthalmologic - Fundi not  visualized due to noncooperation.  Cardiovascular - Regular rate and rhythm.  Mental Status -  Level of arousal and orientation to year, place, and person were intact, not orientated to month Language including expression, naming, repetition, comprehension was assessed and found intact. Fund of Knowledge was assessed and was intact.  Cranial Nerves II - XII - II - Visual field intact OU. III, IV, VI - Extraocular movements intact. V - Facial sensation intact bilaterally. VII - Facial movement intact bilaterally. VIII - Hearing & vestibular intact  bilaterally. X - Palate elevates symmetrically. XI - Chin turning & shoulder shrug intact bilaterally. XII - Tongue protrusion intact.  Motor Strength - The patient's strength was 5/5 RUE, 5-/5 LUE, 2+/5 BLE and pronator drift was present on the left (all chronic).  Bulk was normal and fasciculations were absent.   Motor Tone - Muscle tone was assessed at the neck and appendages and was normal.  Reflexes - The patient's reflexes were 1+ in all extremities and she had no pathological reflexes.  Sensory - Light touch, temperature/pinprick were assessed and were symmetrical.    Coordination - The patient had normal movements in the hands with no ataxia or dysmetria.  Tremor was absent.  Gait and Station - not tested.   ASSESSMENT/PLAN Julia Floyd is a 83 y.o. female with history of remote stroke, Alzheimer's dementia, trigeminal neuralgia, Type II diabetes mellitus, and hypertension, who presents with acute onset aphasia. She received IV t-PA at 1936 on 10/07/2016.   Stroke or TIA s/p tPA with multifocal ICH - etiology unclear, CAA hematoma vs. CVT vs. hemorrhagic transformation  Resultant  Confusion and aphasia resolved  CT head: No stroke  CTA head:  Atherosclerosis of bilateral ICAs, left subclavian artery with 50% stenosis, left vertebral artery origin occlusion.  2D Echo: EF 60-65%   MRI and MRV pending  LDL 123  HgbA1c 5.1  SCDs for VTE prophylaxis DIET DYS 3 Room service appropriate? Yes; Fluid consistency: Thin  aspirin 325 mg daily prior to admission, now on No antithrombotic due to [place]  Patient counseled to be compliant with her antithrombotic medications  Ongoing aggressive stroke risk factor management  Therapy recommendations:  CIR vs. SNF  Disposition:   pending  Hypertension  Stable  Goal SBP < 160 mmHg due to IPH   Long-term BP goal normotensive  Hyperlipidemia  Home meds:  none  LDL 123, goal < 70  Add pravastatin 40  Continue on  discharge  Diabetes  HgbA1c  5.1, goal < 7.0  Controlled  Other Stroke Risk Factors  Advanced age  Hx of stroke 15 years ago with residue wheelchair bound and left UE mild weakness  Other Active Problems  Wheelchair bound at home PTA  TN  Daughter denies any dementia at home  Hospital day # 5  Julia Hawking, MD PhD Stroke Neurology 10/12/2016 2:23 PM    To contact Stroke Continuity provider, please refer to [email or web address] After hours, contact General Neurology

## 2016-10-13 DIAGNOSIS — I611 Nontraumatic intracerebral hemorrhage in hemisphere, cortical: Secondary | ICD-10-CM

## 2016-10-13 DIAGNOSIS — G936 Cerebral edema: Secondary | ICD-10-CM

## 2016-10-13 LAB — BASIC METABOLIC PANEL
ANION GAP: 4 — AB (ref 5–15)
BUN: 5 mg/dL — ABNORMAL LOW (ref 6–20)
CALCIUM: 8.5 mg/dL — AB (ref 8.9–10.3)
CO2: 28 mmol/L (ref 22–32)
CREATININE: 0.61 mg/dL (ref 0.44–1.00)
Chloride: 107 mmol/L (ref 101–111)
Glucose, Bld: 108 mg/dL — ABNORMAL HIGH (ref 65–99)
Potassium: 3.4 mmol/L — ABNORMAL LOW (ref 3.5–5.1)
SODIUM: 139 mmol/L (ref 135–145)

## 2016-10-13 LAB — CBC
HEMATOCRIT: 27.5 % — AB (ref 36.0–46.0)
Hemoglobin: 8.5 g/dL — ABNORMAL LOW (ref 12.0–15.0)
MCH: 29.3 pg (ref 26.0–34.0)
MCHC: 30.9 g/dL (ref 30.0–36.0)
MCV: 94.8 fL (ref 78.0–100.0)
PLATELETS: 122 10*3/uL — AB (ref 150–400)
RBC: 2.9 MIL/uL — ABNORMAL LOW (ref 3.87–5.11)
RDW: 16.4 % — AB (ref 11.5–15.5)
WBC: 5.3 10*3/uL (ref 4.0–10.5)

## 2016-10-13 MED ORDER — BISACODYL 10 MG RE SUPP
10.0000 mg | Freq: Once | RECTAL | Status: AC
Start: 2016-10-13 — End: 2016-10-13
  Administered 2016-10-13: 10 mg via RECTAL
  Filled 2016-10-13: qty 1

## 2016-10-13 NOTE — H&P (Signed)
Physical Medicine and Rehabilitation Admission H&P    Chief Complaint  Patient presents with  . Aphasia  : HPI: Julia Floyd is a 83 y.o. right handed female with history of remote stroke, Alzheimer's dementia, trigeminal neuralgia, diabetes mellitus and hypertension. Per chart review patient lives with family. One level home with ramped entrance. She has a home health aide 2 hours daily to assist with ADLs. Patient performs sit to stand and stand pivot transfers modified independent prior to admission. Presented 10/07/2016 with altered mental status and inability to speak. Cranial CT scan negative for acute changes. Patient did receive TPA. CT angiogram head and neck showed no large or medium vessel occlusion identified. Left subclavian artery with 50% stenosis. Follow-up CTI showed interval development of multifocal intraparenchymal hemorrhages no significant mass effect. MRI showed no new hemorrhage is present there was some surrounding vasogenic edema with small local mass effect particularly in the left cerebellum. Echocardiogram with ejection fraction 65% grade 1 diastolic dysfunction. Neurology follow-up with close monitoring. No current anti-thrombotic due to [place]. Dysphagia #3 thin liquid diet. Physical therapy evaluation completed with recommendations of physical medicine rehabilitation consult.Patient was admitted for comprehensive rehabilitation program.  Review of Systems  Constitutional: Negative for chills and fever.  HENT: Negative for hearing loss.   Eyes: Negative for blurred vision and double vision.  Respiratory: Positive for cough and shortness of breath.   Cardiovascular: Positive for orthopnea and leg swelling. Negative for chest pain.  Gastrointestinal: Positive for constipation. Negative for blood in stool, nausea and vomiting.  Genitourinary: Positive for urgency.  Musculoskeletal: Positive for back pain and myalgias.  Skin: Negative for rash.  Neurological:  Negative for seizures.  Psychiatric/Behavioral: Positive for depression and memory loss.  All other systems reviewed and are negative.  Past Medical History:  Diagnosis Date  . DDD (degenerative disc disease)   . Depression   . Diabetes ([place])   . HTN (hypertension)   . Psychotic disorder    secondary to the below factors. 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 Bipolar disorder, most likely type 3, this episode being manic  . Sixth cranial nerve palsy   . Trigeminal neuralgia   . Uterine fibroid    Past Surgical History:  Procedure Laterality Date  . BRONCHOSCOPY    . TRACHEOSTOMY     Family History  Problem Relation Age of Onset  . Heart attack Unknown   . Diabetes Unknown    Social History:  reports that she has quit smoking. She has never used smokeless tobacco. She reports that she does not drink alcohol or use drugs. Allergies: No Known Allergies Medications Prior to Admission  Medication Sig Dispense Refill  . amLODipine (NORVASC) 5 MG tablet Take 1 tablet (5 mg total) by mouth daily. 5 tablet 0  . aspirin 325 MG tablet Take 325 mg by mouth daily.    . cyanocobalamin 500 MCG tablet Take 500 mcg by mouth. Vitamin B12    . furosemide (LASIX) 40 MG tablet Take 1 tablet (40 mg total) by mouth daily. 5 tablet 0  . gabapentin (NEURONTIN) 100 MG capsule Take 100-200 mg by mouth at bedtime.    . metFORMIN (GLUCOPHAGE) 500 MG tablet Take 500-1,000 mg by mouth 2 (two) times daily with a meal.       Home: Home Living Family/patient expects to be discharged to:: Private residence Living Arrangements: Children Available Help at Discharge: Family, Available 24 hours/day Type of Home: House Home Access: Ramped entrance  Home Layout: One level Bathroom Shower/Tub: Tub/shower unit, Other (comment) (Pt taking sponge baths) Bathroom Toilet: Standard Home Equipment: Bedside commode, Tub bench, Grab bars - toilet, Wheelchair - power, Hospital bed Additional  Comments: Pt has an aide that comes every morning for 2 hours to assist with ADL  Lives With: Daughter   Functional History: Prior Function Level of Independence: Needs assistance Gait / Transfers Assistance Needed: per family, pt performed sit<>stand and stand-pivot transfers with mod I ADL's / Homemaking Assistance Needed: assist with ADLs from aide that comes for 2 hours every morning and daughters, feeds self  Functional Status:  Mobility: Bed Mobility Overal bed mobility: Needs Assistance Bed Mobility: Sit to Supine Supine to sit: Mod assist, HOB elevated Sit to supine: Max assist General bed mobility comments: max A for assist with BLE back into bed, and then +2 for scooting in bed Transfers Overall transfer level: Needs assistance Equipment used: 2 person hand held assist (Face to face with gait belt and chuck pad) Transfer via Lift Equipment: Stedy Transfers: Sit to/from Stand Sit to Stand: +2 safety/equipment, Max assist Stand pivot transfers: Max assist, +2 physical assistance General transfer comment: 2 person face to face with gait belt and chuck pad, performed si <> stand x3 prior to SPT to chair. Patient with some increased FOF and anxiety during transfers Ambulation/Gait General Gait Details: pt uses a w/c for mobility at baseline    ADL: ADL Overall ADL's : Needs assistance/impaired Eating/Feeding: Set up, Sitting Grooming: Minimal assistance, Sitting Upper Body Bathing: Moderate assistance Lower Body Bathing: Moderate assistance Upper Body Dressing : Minimal assistance Lower Body Dressing: Maximal assistance Toilet Transfer: +2 for physical assistance, +2 for safety/equipment, Maximal assistance (STEDY used for Pt and therapist safety, NT assisting) Toilet Transfer Details (indicate cue type and reason): simulated through transfer to bed Toileting- Clothing Manipulation and Hygiene:  (Pt currently using external female catheter) Toileting - Clothing  Manipulation Details (indicate cue type and reason): Pt currently using external catheter Tub/ Shower Transfer: Maximal assistance, +2 for physical assistance, Stand-pivot, Tub bench General ADL Comments: Pt with siginificant decreased independence in transfers necessary for ADL  Cognition: Cognition Overall Cognitive Status: History of cognitive impairments - at baseline Orientation Level: Oriented to person, Oriented to place, Oriented to situation Cognition Arousal/Alertness: Awake/alert Behavior During Therapy: WFL for tasks assessed/performed Overall Cognitive Status: History of cognitive impairments - at baseline General Comments: pt with Alzheimer's dementia at baseline  Physical Exam: Blood pressure (!) 131/52, pulse 80, temperature 98.2 F (36.8 C), temperature source Oral, resp. rate 18, height 5' 6"  (1.676 m), weight 71.5 kg (157 lb 10.1 oz), SpO2 100 %. Physical Exam  Constitutional: She appears well-developed. No distress.  HENT:  Head: Normocephalic and atraumatic.  Eyes: Pupils are equal, round, and reactive to light. EOM are normal.  Neck: Normal range of motion. Neck supple. No tracheal deviation present. No thyromegaly present.  Cardiovascular: Normal rate, regular rhythm and normal heart sounds.  Exam reveals no friction rub.   No murmur heard. Respiratory: Effort normal and breath sounds normal. No respiratory distress. She has no wheezes. She has no rales.  GI: Soft. Bowel sounds are normal. She exhibits no distension. There is no tenderness. There is no rebound.  Psychiatric: She has a normal mood and affect. Her behavior is normal. Thought content normal.  Skin. Warm and dry Neurological: She is alert.  Makes good eye contact with examiner. Follows simple commands. Provides her name and age. Fair awareness of deficits.  RUE and RLE 4 to 4+/5. LUE 4/5 prox to distal. Fair coordination. LLE: 2- to 2/5 HF, 2+KE and 3/5 ADF/PF. Oriented to person, place month/year.  Fair insight and awareness. Senses pain and light touch on all 4's   Results for orders placed or performed during the hospital encounter of 10/07/16 (from the past 48 hour(s))  CBC     Status: Abnormal   Collection Time: 10/12/16  4:03 AM  Result Value Ref Range   WBC 5.5 4.0 - 10.5 K/uL   RBC 2.82 (L) 3.87 - 5.11 MIL/uL   Hemoglobin 8.2 (L) 12.0 - 15.0 g/dL   HCT 26.8 (L) 36.0 - 46.0 %   MCV 95.0 78.0 - 100.0 fL   MCH 29.1 26.0 - 34.0 pg   MCHC 30.6 30.0 - 36.0 g/dL   RDW 16.3 (H) 11.5 - 15.5 %   Platelets 127 (L) 150 - 400 K/uL  Basic metabolic panel     Status: Abnormal   Collection Time: 10/12/16  4:03 AM  Result Value Ref Range   Sodium 139 135 - 145 mmol/L   Potassium 3.4 (L) 3.5 - 5.1 mmol/L   Chloride 109 101 - 111 mmol/L   CO2 26 22 - 32 mmol/L   Glucose, Bld 101 (H) 65 - 99 mg/dL   BUN 5 (L) 6 - 20 mg/dL   Creatinine, Ser 0.72 0.44 - 1.00 mg/dL   Calcium 8.5 (L) 8.9 - 10.3 mg/dL   GFR calc non Af Amer >60 >60 mL/min   GFR calc Af Amer >60 >60 mL/min    Comment: (NOTE) The eGFR has been calculated using the CKD EPI equation. This calculation has not been validated in all clinical situations. eGFR's persistently <60 mL/min signify possible Chronic Kidney Disease.    Anion gap 4 (L) 5 - 15  CBC     Status: Abnormal   Collection Time: 10/13/16  4:12 AM  Result Value Ref Range   WBC 5.3 4.0 - 10.5 K/uL   RBC 2.90 (L) 3.87 - 5.11 MIL/uL   Hemoglobin 8.5 (L) 12.0 - 15.0 g/dL   HCT 27.5 (L) 36.0 - 46.0 %   MCV 94.8 78.0 - 100.0 fL   MCH 29.3 26.0 - 34.0 pg   MCHC 30.9 30.0 - 36.0 g/dL   RDW 16.4 (H) 11.5 - 15.5 %   Platelets 122 (L) 150 - 400 K/uL  Basic metabolic panel     Status: Abnormal   Collection Time: 10/13/16  4:12 AM  Result Value Ref Range   Sodium 139 135 - 145 mmol/L   Potassium 3.4 (L) 3.5 - 5.1 mmol/L   Chloride 107 101 - 111 mmol/L   CO2 28 22 - 32 mmol/L   Glucose, Bld 108 (H) 65 - 99 mg/dL   BUN 5 (L) 6 - 20 mg/dL   Creatinine, Ser 0.61  0.44 - 1.00 mg/dL   Calcium 8.5 (L) 8.9 - 10.3 mg/dL   GFR calc non Af Amer >60 >60 mL/min   GFR calc Af Amer >60 >60 mL/min    Comment: (NOTE) The eGFR has been calculated using the CKD EPI equation. This calculation has not been validated in all clinical situations. eGFR's persistently <60 mL/min signify possible Chronic Kidney Disease.    Anion gap 4 (L) 5 - 15   Mr Jeri Cos Wo Contrast  Result Date: 10/12/2016 CLINICAL DATA:  Multiple subcortical brainstem hemorrhages following tPA. Acute infarct 5 days ago. EXAM: MRI HEAD WITHOUT AND WITH CONTRAST  MRV HEAD WITHOUT CONTRAST TECHNIQUE: Multiplanar, multiecho pulse sequences of the brain and surrounding structures were obtained without and with intravenous contrast. Angiographic images of the head were obtained using MRV technique without contrast. CONTRAST:  <See Chart> MULTIHANCE GADOBENATE DIMEGLUMINE 529 MG/ML IV SOLN COMPARISON:  CT head without contrast 10/09/2016 and 10/08/2016. FINDINGS: MRI HEAD FINDINGS Brain: Multiple subcortical hemorrhages are again noted bilaterally. Prominent areas involving the right temporal tip, left cerebellum, posterior inferior left temporal lobe and left frontal operculum are stable. No new areas of hemorrhage are present. There is no significant progression of these hemorrhages. Extensive surrounding vasogenic edema is associated. Local mass effect is present without midline shift or herniation. The postcontrast images demonstrate intrinsic T1 shortening of the hemorrhagic areas without associated enhancement. A meningioma along the anterior inferior falx measures 8 x 8 x 5 mm. No other pathologic enhancement is present. No areas of acute nonhemorrhagic infarct are present. The ventricles are proportionate to the degree of atrophy. No significant extra-axial fluid collection is present. Vascular: Flow is present in the major intracranial arteries. Skull and upper cervical spine: The skullbase is within normal  limits. The craniocervical junction is normal. The upper cervical spine is within normal limits. Marrow signal is normal. Sinuses/Orbits: The paranasal sinuses and mastoid air cells are clear. The globes and orbits are within normal limits. MRV HEAD FINDINGS The dural sinuses demonstrate normal flow. The transverse sinuses are codominant. The straight sinus and deep cerebral veins are intact. Cortical veins are unremarkable. IMPRESSION: 1. Stable appearance of multiple scattered subcortical hemorrhages as previously described. 2. Surrounding vasogenic edema with some local mass effect, particularly in the left cerebellum. There is no midline shift or herniation. 3. No new hemorrhages are present. 4. No pathologic enhancement. 5. Normal MR venogram of the head. Electronically Signed   By: San Morelle M.D.   On: 10/12/2016 17:54   Mr Mrv Head Wo Cm  Result Date: 10/12/2016 CLINICAL DATA:  Multiple subcortical brainstem hemorrhages following tPA. Acute infarct 5 days ago. EXAM: MRI HEAD WITHOUT AND WITH CONTRAST MRV HEAD WITHOUT CONTRAST TECHNIQUE: Multiplanar, multiecho pulse sequences of the brain and surrounding structures were obtained without and with intravenous contrast. Angiographic images of the head were obtained using MRV technique without contrast. CONTRAST:  <See Chart> MULTIHANCE GADOBENATE DIMEGLUMINE 529 MG/ML IV SOLN COMPARISON:  CT head without contrast 10/09/2016 and 10/08/2016. FINDINGS: MRI HEAD FINDINGS Brain: Multiple subcortical hemorrhages are again noted bilaterally. Prominent areas involving the right temporal tip, left cerebellum, posterior inferior left temporal lobe and left frontal operculum are stable. No new areas of hemorrhage are present. There is no significant progression of these hemorrhages. Extensive surrounding vasogenic edema is associated. Local mass effect is present without midline shift or herniation. The postcontrast images demonstrate intrinsic T1 shortening  of the hemorrhagic areas without associated enhancement. A meningioma along the anterior inferior falx measures 8 x 8 x 5 mm. No other pathologic enhancement is present. No areas of acute nonhemorrhagic infarct are present. The ventricles are proportionate to the degree of atrophy. No significant extra-axial fluid collection is present. Vascular: Flow is present in the major intracranial arteries. Skull and upper cervical spine: The skullbase is within normal limits. The craniocervical junction is normal. The upper cervical spine is within normal limits. Marrow signal is normal. Sinuses/Orbits: The paranasal sinuses and mastoid air cells are clear. The globes and orbits are within normal limits. MRV HEAD FINDINGS The dural sinuses demonstrate normal flow. The transverse sinuses are codominant. The  straight sinus and deep cerebral veins are intact. Cortical veins are unremarkable. IMPRESSION: 1. Stable appearance of multiple scattered subcortical hemorrhages as previously described. 2. Surrounding vasogenic edema with some local mass effect, particularly in the left cerebellum. There is no midline shift or herniation. 3. No new hemorrhages are present. 4. No pathologic enhancement. 5. Normal MR venogram of the head. Electronically Signed   By: San Morelle M.D.   On: 10/12/2016 17:54       Medical Problem List and Plan: 1.  Decreased functional mobility with aphasia secondary to multifocal intraparenchymal hemorrhages  -admit to inpatient rehab 2.  DVT Prophylaxis/Anticoagulation: SCDs.  3. Pain Management/trigeminal neuralgia: Resume Neurontin 100-200 mg at bedtime as needed 4. Mood/Alzheimer's dementia: Provide emotional support. Discuss baseline with family 5. Neuropsych: This patient is capable of making decisions on her own behalf. 6. Skin/Wound Care: Routine skin checks 7. Fluids/Electrolytes/Nutrition: Routine I&O's with follow-up chemistries 8. Hypertension. Norvasc 10 mg daily, Lasix  40 mg daily 9. Diabetes mellitus. Hemoglobin A1c 5.1. CBGs discontinued 10. Hyperlipidemia. Pravachol   Post Admission Physician Evaluation: 1. Functional deficits secondary  to multifocal intraparenchymal hemorrhages. 2. Patient is admitted to receive collaborative, interdisciplinary care between the physiatrist, rehab nursing staff, and therapy team. 3. Patient's level of medical complexity and substantial therapy needs in context of that medical necessity cannot be provided at a lesser intensity of care such as a SNF. 4. Patient has experienced substantial functional loss from his/her baseline which was documented above under the "Functional History" and "Functional Status" headings.  Judging by the patient's diagnosis, physical exam, and functional history, the patient has potential for functional progress which will result in measurable gains while on inpatient rehab.  These gains will be of substantial and practical use upon discharge  in facilitating mobility and self-care at the household level. 5. Physiatrist will provide 24 hour management of medical needs as well as oversight of the therapy plan/treatment and provide guidance as appropriate regarding the interaction of the two. 6. The Preadmission Screening has been reviewed and patient status is unchanged unless otherwise stated above. 7. 24 hour rehab nursing will assist with bladder management, bowel management, safety, skin/wound care, disease management, medication administration, pain management and patient education  and help integrate therapy concepts, techniques,education, etc. 8. PT will assess and treat for/with: Lower extremity strength, range of motion, stamina, balance, functional mobility, safety, adaptive techniques and equipment, NMR, family ed, ego supprot.   Goals are: supervision to mod I goals. 9. OT will assess and treat for/with: ADL's, functional mobility, safety, upper extremity strength, adaptive techniques and  equipment, NMR, family ed, community reentry.   Goals are: supervision to mod I. Therapy may proceed with showering this patient. 10. SLP will assess and treat for/with: cognition, communication.  Goals are: supervision to mod I. 11. Case Management and Social Worker will assess and treat for psychological issues and discharge planning. 12. Team conference will be held weekly to assess progress toward goals and to determine barriers to discharge. 13. Patient will receive at least 3 hours of therapy per day at least 5 days per week. 14. ELOS: 14-18 days       15. Prognosis:  excellent     [name], MD, [facility] 10/14/2016  Cathlyn Parsons., PA-C 10/13/2016

## 2016-10-13 NOTE — Progress Notes (Signed)
  Speech Language Pathology Treatment: Dysphagia  Patient Details Name: Julia Floyd MRN: [id number] DOB: [birth year] Today's Date: 10/13/2016 Time: 2902-1115 SLP Time Calculation (min) (ACUTE ONLY): 14 min  Assessment / Plan / Recommendation Clinical Impression  SLP provided skilled observation during lunch meal, consisting of soft solids and thin liquids. While she has no overt s/s of aspiration, she does have prolonged mastication. She manages this well with appropriate pacing, bite sizes, and liquid washes with Mod I. She reports that she has difficulty with mastication at baseline with her dentures. Pt and granddaughter believe that she is at her baseline for swallowing. No further SLP f/u needed, will s/o.   HPI HPI: Ptis an 83 y.o.femalewith a history of dementia, trigeminal neuralgia who presents with acute onset aphasia. Initial CT was negative for acute ischemic finding. Repeat CT s/p IV TPA showed interval development of multifocal intraparenchymal hemorrhages. Pt had a prior MBS in 2002 with SLP report not available; however, radiology report indicates penetration with all liquids and even purees. A chin tuck helped improve airway protection with honey thick liquids and purees only.      SLP Plan  All goals met       Recommendations  Diet recommendations: Dysphagia 3 (mechanical soft);Thin liquid Liquids provided via: Cup;Straw Medication Administration: Whole meds with liquid Supervision: Patient able to self feed;Intermittent supervision to cue for compensatory strategies Compensations: Minimize environmental distractions;Slow rate;Small sips/bites Postural Changes and/or Swallow Maneuvers: Seated upright 90 degrees                Oral Care Recommendations: Oral care BID Follow up Recommendations: 24 hour supervision/assistance SLP Visit Diagnosis: Dysphagia, unspecified (R13.10) Plan: All goals met       GO                Germain Osgood 10/13/2016, 11:59 AM  Germain Osgood, M.A. CCC-SLP [phone]

## 2016-10-13 NOTE — Progress Notes (Addendum)
Inpatient Rehabilitation  Met with patient to discuss team's recommendation for IP Rehab.  Shared booklets and answered questions briefly.  Patient requested that I call and speak with her children and grandchildren as well.  Called daughter, Caren Griffins and granddaughter, Yvetta Coder and left messages.  Plan to follow for timing of medical readiness, family decision, and insurance authorization.  Please call with questions.  Update: Spoke with granddaughter Yvetta Coder who states that Caren Griffins is a friend and not a relative.  Yvetta Coder and patient's grandson Nicole Kindred are in favor of IP Rehab for post acute therapies and will discuss a plan for post IP Rehab.  I have notified them that I am available to answer questions they may have along the way.    Carmelia Roller., CCC/SLP Admission Coordinator  [place]  Cell [phone]

## 2016-10-13 NOTE — Progress Notes (Signed)
Physical Therapy Treatment Patient Details Name: Julia Floyd MRN: [id number] DOB: 01/16/1931 Today's Date: 10/13/2016    History of Present Illness Pt is an 83 y.o. female with a history of remote stroke, Alzheimer's dementia, trigeminal neuralgia, Type II diabetes mellitus, and hypertension, who presented with acute onset aphasia. Pt found to have an unidentified primary lesion with post-tPA hemorrhagic conversion and subsequent diffuse intraparenchymal microhemorrhages.    PT Comments    Patient seen for activity progression. Focus on transfer training and seated balance activities. At this time, patient tolerated activities well but continues to show increased need for physical support for all aspects of mobility. Current POC remains appropriate.   Follow Up Recommendations  CIR;Supervision/Assistance - 24 hour     Equipment Recommendations  None recommended by PT    Recommendations for Other Services Rehab consult     Precautions / Restrictions Precautions Precautions: Fall Restrictions Weight Bearing Restrictions: No    Mobility  Bed Mobility Overal bed mobility: Needs Assistance Bed Mobility: Supine to Sit     Supine to sit: Mod assist;HOB elevated    General bed mobility comments: Patient able to bring LEs to EOB, cues to reach across with UE to grab rail, moderate assist to pull to sit and rotate trunk to EOB. Increased time and effort noted. Patient with initial posterior list, facilitation required to reach midline.  Transfers Overall transfer level: Needs assistance Equipment used: 2 person hand held assist (Face to face with gait belt and chuck pad) Transfers: Sit to/from Stand Sit to Stand: +2 safety/equipment;Max assist (performed x3) Stand pivot transfers: Max assist;+2 physical assistance       General transfer comment: 2 person face to face with gait belt and chuck pad, performed si <> stand x3 prior to SPT to chair. Patient with some increased  FOF and anxiety during transfers  Ambulation/Gait             General Gait Details: pt uses a w/c for mobility at baseline   Stairs            Wheelchair Mobility    Modified Rankin (Stroke Patients Only) Modified Rankin (Stroke Patients Only) Pre-Morbid Rankin Score: Severe disability Modified Rankin: Severe disability     Balance Overall balance assessment: Needs assistance Sitting-balance support: Bilateral upper extremity supported;Feet supported Sitting balance-Leahy Scale: Fair Sitting balance - Comments: trunk control activities performed during session, improvements noted   Standing balance support: Bilateral upper extremity supported Standing balance-Leahy Scale: Poor                              Cognition Arousal/Alertness: Awake/alert Behavior During Therapy: WFL for tasks assessed/performed Overall Cognitive Status: History of cognitive impairments - at baseline                                 General Comments: pt with Alzheimer's dementia at baseline      Exercises Other Exercises Other Exercises: EOB trunk flexion bilateral reaching Other Exercises: EOB lateral trunk twists Other Exercises: EOB dynamic postural facilitation    General Comments        Pertinent Vitals/Pain Pain Assessment: No/denies pain Faces Pain Scale: No hurt    Home Living                      Prior Function  PT Goals (current goals can now be found in the care plan section) Acute Rehab PT Goals Patient Stated Goal: return to PLOF (independence with transfers) PT Goal Formulation: With patient/family Time For Goal Achievement: 10/24/16 Potential to Achieve Goals: Good Progress towards PT goals: Progressing toward goals    Frequency    Min 4X/week      PT Plan Current plan remains appropriate    Co-evaluation              AM-PAC PT "6 Clicks" Daily Activity  Outcome Measure  Difficulty turning  over in bed (including adjusting bedclothes, sheets and blankets)?: A Lot Difficulty moving from lying on back to sitting on the side of the bed? : Total Difficulty sitting down on and standing up from a chair with arms (e.g., wheelchair, bedside commode, etc,.)?: Total Help needed moving to and from a bed to chair (including a wheelchair)?: A Lot Help needed walking in hospital room?: Total Help needed climbing 3-5 steps with a railing? : Total 6 Click Score: 8    End of Session Equipment Utilized During Treatment: Gait belt Activity Tolerance: Patient tolerated treatment well Patient left: in chair;with call bell/phone within reach;with family/visitor present (with SLP) Nurse Communication: Mobility status PT Visit Diagnosis: Other abnormalities of gait and mobility (R26.89);Other symptoms and signs involving the nervous system (R29.898)     Time: 6389-3734 PT Time Calculation (min) (ACUTE ONLY): 17 min  Charges:  $Therapeutic Activity: 8-22 mins                    G Codes:       Alben Deeds, PT DPT NCS [phone]    Duncan Dull 10/13/2016, 1:24 PM

## 2016-10-13 NOTE — Progress Notes (Signed)
OT Cancellation Note  Patient Details Name: Julia Floyd MRN: [id number] DOB: 05/31/1930   Cancelled Treatment:    Reason Eval/Treat Not Completed: Other (comment). Pt meeting with CIR admissions coordinator on OT arrival. Will check back as able to progress with plan of care.  Norman Herrlich, MS OTR/L  Pager: [place] A Horacio Werth 10/13/2016, 3:30 PM

## 2016-10-13 NOTE — Consult Note (Signed)
Physical Medicine and Rehabilitation Consult Reason for Consult: Decreased functional mobility with acute aphasia Referring Physician: Dr.Xu   HPI: Julia Floyd is a 83 y.o. right handed female with history of remote stroke, Alzheimer's dementia, trigeminal neuralgia, diabetes mellitus and hypertension. Per chart review patient lives with family. One level home with ramped entrance. She has a home health aide 2 hours daily to assist with ADLs. Patient performs sit to stand and stand pivot transfers modified independent prior to admission. Presented 10/07/2016 with altered mental status and inability to speak. Cranial CT scan negative for acute changes. Patient did receive TPA. CT angiogram head and neck showed no large or medium vessel occlusion identified. Left subclavian artery with 50% stenosis. Follow-up CTI showed interval development of multifocal intraparenchymal hemorrhages no significant mass effect. MRI showed no new hemorrhage is present there was some surrounding vasogenic edema with small local mass effect particularly in the left cerebellum. Echocardiogram with ejection fraction 86% grade 1 diastolic dysfunction. Neurology follow-up with close monitoring. No current anti-thrombotic due to Julia Floyd. Dysphagia #3 thin liquid diet. Physical therapy evaluation completed with recommendations of physical medicine rehabilitation consult.   Review of Systems  Constitutional: Negative for chills and fever.  Eyes: Negative for blurred vision and double vision.  Respiratory: Negative for cough and shortness of breath.   Cardiovascular: Positive for leg swelling. Negative for chest pain and palpitations.  Gastrointestinal: Positive for blood in stool. Negative for nausea and vomiting.  Genitourinary: Positive for urgency. Negative for dysuria, flank pain and hematuria.  Musculoskeletal: Positive for joint pain and myalgias.  Neurological: Positive for speech change.    Psychiatric/Behavioral: Positive for depression and memory loss.  All other systems reviewed and are negative.  Past Medical History:  Diagnosis Date  . DDD (degenerative disc disease)   . Depression   . Diabetes ([place])   . HTN (hypertension)   . Psychotic disorder    secondary to the below factors. 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 Bipolar disorder, most likely type 3, this episode being manic  . Sixth cranial nerve palsy   . Trigeminal neuralgia   . Uterine fibroid    Past Surgical History:  Procedure Laterality Date  . BRONCHOSCOPY    . TRACHEOSTOMY     Family History  Problem Relation Age of Onset  . Heart attack Unknown   . Diabetes Unknown    Social History:  reports that she has quit smoking. She has never used smokeless tobacco. She reports that she does not drink alcohol or use drugs. Allergies: No Known Allergies Medications Prior to Admission  Medication Sig Dispense Refill  . amLODipine (NORVASC) 5 MG tablet Take 1 tablet (5 mg total) by mouth daily. 5 tablet 0  . aspirin 325 MG tablet Take 325 mg by mouth daily.    . cyanocobalamin 500 MCG tablet Take 500 mcg by mouth. Vitamin B12    . furosemide (LASIX) 40 MG tablet Take 1 tablet (40 mg total) by mouth daily. 5 tablet 0  . gabapentin (NEURONTIN) 100 MG capsule Take 100-200 mg by mouth at bedtime.    . metFORMIN (GLUCOPHAGE) 500 MG tablet Take 500-1,000 mg by mouth 2 (two) times daily with a meal.       Home: Home Living Family/patient expects to be discharged to:: Private residence Living Arrangements: Children Available Help at Discharge: Family, Available 24 hours/day Type of Home: House Home Access: Ramped entrance Home Layout: One level Bathroom Shower/Tub: Tub/shower unit,  Other (comment) (Pt taking sponge baths) Bathroom Toilet: Standard Home Equipment: Bedside commode, Tub bench, Grab bars - toilet, Wheelchair - power, Hospital bed Additional Comments: Pt has an aide that  comes every morning for 2 hours to assist with ADL  Lives With: Daughter  Functional History: Prior Function Level of Independence: Needs assistance Gait / Transfers Assistance Needed: per family, pt performed sit<>stand and stand-pivot transfers with mod I ADL's / Homemaking Assistance Needed: assist with ADLs from aide that comes for 2 hours every morning and daughters, feeds self Functional Status:  Mobility: Bed Mobility Overal bed mobility: Needs Assistance Bed Mobility: Sit to Supine Supine to sit: Mod assist, HOB elevated Sit to supine: Max assist General bed mobility comments: max A for assist with BLE back into bed, and then +2 for scooting in bed Transfers Overall transfer level: Needs assistance Equipment used: 2 person hand held assist Transfer via Lift Equipment: Stedy Transfers: Sit to/from Stand Sit to Stand: +2 safety/equipment, Max assist Stand pivot transfers: Max assist, +2 physical assistance General transfer comment: use of STEDY, use of bedpad to assist Pt in boost Ambulation/Gait General Gait Details: pt uses a w/c for mobility at baseline    ADL: ADL Overall ADL's : Needs assistance/impaired Eating/Feeding: Set up, Sitting Grooming: Minimal assistance, Sitting Upper Body Bathing: Moderate assistance Lower Body Bathing: Moderate assistance Upper Body Dressing : Minimal assistance Lower Body Dressing: Maximal assistance Toilet Transfer: +2 for physical assistance, +2 for safety/equipment, Maximal assistance (STEDY used for Pt and therapist safety, NT assisting) Toilet Transfer Details (indicate cue type and reason): simulated through transfer to bed Toileting- Clothing Manipulation and Hygiene:  (Pt currently using external female catheter) Toileting - Clothing Manipulation Details (indicate cue type and reason): Pt currently using external catheter Tub/ Shower Transfer: Maximal assistance, +2 for physical assistance, Stand-pivot, Tub bench General ADL  Comments: Pt with siginificant decreased independence in transfers necessary for ADL  Cognition: Cognition Overall Cognitive Status: History of cognitive impairments - at baseline Orientation Level: Oriented to person, Oriented to place, Oriented to situation Cognition Arousal/Alertness: Awake/alert Behavior During Therapy: WFL for tasks assessed/performed Overall Cognitive Status: History of cognitive impairments - at baseline General Comments: pt with Alzheimer's dementia at baseline  Blood pressure (!) 133/54, pulse 69, temperature 98.3 F (36.8 C), temperature source Oral, resp. rate 18, height 5\' 6"  (1.676 m), weight 71.5 kg (157 lb 10.1 oz), SpO2 100 %. Physical Exam  Vitals reviewed. Constitutional: She appears well-developed.  HENT:  Head: Normocephalic.  Eyes: EOM are normal.  Neck: Normal range of motion. Neck supple. No thyromegaly present.  Cardiovascular: Normal rate, regular rhythm and normal heart sounds.   Respiratory: Effort normal and breath sounds normal. No respiratory distress.  GI: Soft. Bowel sounds are normal. She exhibits no distension.  Neurological: She is alert.  Makes good eye contact with examiner. Follows simple commands. Provides her name and age. Fair awareness of deficits. RUE and RLE 4 to 4+/5. LUE 4/5 prox to distal. Fair coordination. LLE: 2/5 HF, 2+KE and 3/5 ADF/PF. Oriented x2. Fair insight and awareness. Senses pain and light touch equally all 4's.   Skin: Skin is warm and dry.  Psychiatric: She has a normal mood and affect. Her behavior is normal. Judgment and thought content normal.    Results for orders placed or performed during the hospital encounter of 10/07/16 (from the past 24 hour(s))  CBC     Status: Abnormal   Collection Time: 10/13/16  4:12 AM  Result Value  Ref Range   WBC 5.3 4.0 - 10.5 K/uL   RBC 2.90 (L) 3.87 - 5.11 MIL/uL   Hemoglobin 8.5 (L) 12.0 - 15.0 g/dL   HCT 27.5 (L) 36.0 - 46.0 %   MCV 94.8 78.0 - 100.0 fL   MCH  29.3 26.0 - 34.0 pg   MCHC 30.9 30.0 - 36.0 g/dL   RDW 16.4 (H) 11.5 - 15.5 %   Platelets 122 (L) 150 - 400 K/uL  Basic metabolic panel     Status: Abnormal   Collection Time: 10/13/16  4:12 AM  Result Value Ref Range   Sodium 139 135 - 145 mmol/L   Potassium 3.4 (L) 3.5 - 5.1 mmol/L   Chloride 107 101 - 111 mmol/L   CO2 28 22 - 32 mmol/L   Glucose, Bld 108 (H) 65 - 99 mg/dL   BUN 5 (L) 6 - 20 mg/dL   Creatinine, Ser 0.61 0.44 - 1.00 mg/dL   Calcium 8.5 (L) 8.9 - 10.3 mg/dL   GFR calc non Af Amer >60 >60 mL/min   GFR calc Af Amer >60 >60 mL/min   Anion gap 4 (L) 5 - 15   Mr Brain W Wo Contrast  Result Date: 10/12/2016 CLINICAL DATA:  Multiple subcortical brainstem hemorrhages following tPA. Acute infarct 5 days ago. EXAM: MRI HEAD WITHOUT AND WITH CONTRAST MRV HEAD WITHOUT CONTRAST TECHNIQUE: Multiplanar, multiecho pulse sequences of the brain and surrounding structures were obtained without and with intravenous contrast. Angiographic images of the head were obtained using MRV technique without contrast. CONTRAST:  <See Chart> MULTIHANCE GADOBENATE DIMEGLUMINE 529 MG/ML IV SOLN COMPARISON:  CT head without contrast 10/09/2016 and 10/08/2016. FINDINGS: MRI HEAD FINDINGS Brain: Multiple subcortical hemorrhages are again noted bilaterally. Prominent areas involving the right temporal tip, left cerebellum, posterior inferior left temporal lobe and left frontal operculum are stable. No new areas of hemorrhage are present. There is no significant progression of these hemorrhages. Extensive surrounding vasogenic edema is associated. Local mass effect is present without midline shift or herniation. The postcontrast images demonstrate intrinsic T1 shortening of the hemorrhagic areas without associated enhancement. A meningioma along the anterior inferior falx measures 8 x 8 x 5 mm. No other pathologic enhancement is present. No areas of acute nonhemorrhagic infarct are present. The ventricles are  proportionate to the degree of atrophy. No significant extra-axial fluid collection is present. Vascular: Flow is present in the major intracranial arteries. Skull and upper cervical spine: The skullbase is within normal limits. The craniocervical junction is normal. The upper cervical spine is within normal limits. Marrow signal is normal. Sinuses/Orbits: The paranasal sinuses and mastoid air cells are clear. The globes and orbits are within normal limits. MRV HEAD FINDINGS The dural sinuses demonstrate normal flow. The transverse sinuses are codominant. The straight sinus and deep cerebral veins are intact. Cortical veins are unremarkable. IMPRESSION: 1. Stable appearance of multiple scattered subcortical hemorrhages as previously described. 2. Surrounding vasogenic edema with some local mass effect, particularly in the left cerebellum. There is no midline shift or herniation. 3. No new hemorrhages are present. 4. No pathologic enhancement. 5. Normal MR venogram of the head. Electronically Signed   By: San Morelle M.D.   On: 10/12/2016 17:54   Mr Mrv Head Wo Cm  Result Date: 10/12/2016 CLINICAL DATA:  Multiple subcortical brainstem hemorrhages following tPA. Acute infarct 5 days ago. EXAM: MRI HEAD WITHOUT AND WITH CONTRAST MRV HEAD WITHOUT CONTRAST TECHNIQUE: Multiplanar, multiecho pulse sequences of the brain and  surrounding structures were obtained without and with intravenous contrast. Angiographic images of the head were obtained using MRV technique without contrast. CONTRAST:  <See Chart> MULTIHANCE GADOBENATE DIMEGLUMINE 529 MG/ML IV SOLN COMPARISON:  CT head without contrast 10/09/2016 and 10/08/2016. FINDINGS: MRI HEAD FINDINGS Brain: Multiple subcortical hemorrhages are again noted bilaterally. Prominent areas involving the right temporal tip, left cerebellum, posterior inferior left temporal lobe and left frontal operculum are stable. No new areas of hemorrhage are present. There is no  significant progression of these hemorrhages. Extensive surrounding vasogenic edema is associated. Local mass effect is present without midline shift or herniation. The postcontrast images demonstrate intrinsic T1 shortening of the hemorrhagic areas without associated enhancement. A meningioma along the anterior inferior falx measures 8 x 8 x 5 mm. No other pathologic enhancement is present. No areas of acute nonhemorrhagic infarct are present. The ventricles are proportionate to the degree of atrophy. No significant extra-axial fluid collection is present. Vascular: Flow is present in the major intracranial arteries. Skull and upper cervical spine: The skullbase is within normal limits. The craniocervical junction is normal. The upper cervical spine is within normal limits. Marrow signal is normal. Sinuses/Orbits: The paranasal sinuses and mastoid air cells are clear. The globes and orbits are within normal limits. MRV HEAD FINDINGS The dural sinuses demonstrate normal flow. The transverse sinuses are codominant. The straight sinus and deep cerebral veins are intact. Cortical veins are unremarkable. IMPRESSION: 1. Stable appearance of multiple scattered subcortical hemorrhages as previously described. 2. Surrounding vasogenic edema with some local mass effect, particularly in the left cerebellum. There is no midline shift or herniation. 3. No new hemorrhages are present. 4. No pathologic enhancement. 5. Normal MR venogram of the head. Electronically Signed   By: San Morelle M.D.   On: 10/12/2016 17:54    Assessment/Plan: Diagnosis: multifocal intraparenchymal hemorrhages 1. Does the need for close, 24 hr/day medical supervision in concert with the patient's rehab needs make it unreasonable for this patient to be served in a less intensive setting? Yes 2. Co-Morbidities requiring supervision/potential complications: dm, htn 3. Due to bladder management, bowel management, safety, skin/wound care,  disease management, medication administration, pain management and patient education, does the patient require 24 hr/day rehab nursing? Yes 4. Does the patient require coordinated care of a physician, rehab nurse, PT (1-2 hrs/day, 5 days/week), OT (1-2 hrs/day, 5 days/week) and SLP (1-2 hrs/day, 5 days/week) to address physical and functional deficits in the context of the above medical diagnosis(es)? Yes Addressing deficits in the following areas: balance, endurance, locomotion, strength, transferring, bowel/bladder control, bathing, dressing, feeding, grooming, toileting, cognition and psychosocial support 5. Can the patient actively participate in an intensive therapy program of at least 3 hrs of therapy per day at least 5 days per week? Yes 6. The potential for patient to make measurable gains while on inpatient rehab is excellent 7. Anticipated functional outcomes upon discharge from inpatient rehab are modified independent to supervision  with PT, modified independent and supervision with OT, modified independent with SLP. 8. Estimated rehab length of stay to reach the above functional goals is: 14-18 days 9. Anticipated D/C setting: Home 10. Anticipated post D/C treatments: [place] therapy 11. Overall Rehab/Functional Prognosis: excellent  RECOMMENDATIONS: This patient's condition is appropriate for continued rehabilitative care in the following setting: CIR Patient has agreed to participate in recommended program. Yes Note that insurance prior authorization may be required for reimbursement for recommended care.  Comment: Rehab Admissions Coordinator to follow up.  Thanks,  [name], MD, FAAPMR    Cathlyn Parsons., PA-C 10/13/2016

## 2016-10-13 NOTE — Progress Notes (Addendum)
STROKE TEAM PROGRESS NOTE   SUBJECTIVE (INTERVAL HISTORY) Her daughter is at the bedside.  The patient is awake and alert. MRI showed multifocal hemorrhage, no underlying structural lesion. MRV negative. Pending CIR placement.   OBJECTIVE Temp:  [98.1 F (36.7 C)-98.7 F (37.1 C)] 98.2 F (36.8 C) (07/16 1001) Pulse Rate:  [62-86] 62 (07/16 1001) Resp:  [17-18] 18 (07/16 1001) BP: (111-170)/(43-59) 136/43 (07/16 1001) SpO2:  [100 %] 100 % (07/16 1001)  CBC:   Recent Labs Lab 10/07/16 1900  10/12/16 0403 10/13/16 0412  WBC 5.5  --  5.5 5.3  NEUTROABS 2.6  --   --   --   HGB 9.3*  < > 8.2* 8.5*  HCT 30.0*  < > 26.8* 27.5*  MCV 94.9  --  95.0 94.8  PLT 123*  --  127* 122*  < > = values in this interval not displayed.  Basic Metabolic Panel:   Recent Labs Lab 10/12/16 0403 10/13/16 0412  NA 139 139  K 3.4* 3.4*  CL 109 107  CO2 26 28  GLUCOSE 101* 108*  BUN 5* 5*  CREATININE 0.72 0.61  CALCIUM 8.5* 8.5*    Lipid Panel:     Component Value Date/Time   CHOL 154 10/08/2016 0417   TRIG 263 (H) 10/08/2016 0417   HDL 14 (L) 10/08/2016 0417   CHOLHDL 11.0 10/08/2016 0417   VLDL 53 (H) 10/08/2016 0417   LDLCALC 87 10/08/2016 0417   HgbA1c:  Lab Results  Component Value Date   HGBA1C 5.1 10/08/2016   Urine Drug Screen: No results found for: LABOPIA, COCAINSCRNUR, LABBENZ, AMPHETMU, THCU, LABBARB  Alcohol Level     Component Value Date/Time   ETH <5 10/08/2016 1417    IMAGING I have personally reviewed the radiological images below and agree with the radiology interpretations.  Ct Angio Head and neck W and Wo Contrast 10/07/2016 IMPRESSION: No large or medium vessel occlusion identified. Atherosclerotic disease at both carotid bifurcation regions and ICA bulbs. 50% stenosis of the distal ICA bulb all on the right. Maximal stenosis of only 10% of the ICA on the left. Pronounced atherosclerotic disease of the left subclavian artery with maximal stenosis of  50%. 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 Reconstitution in the mid cervical region, giving supply to left PICA only. Dominant right vertebral artery widely patent to the basilar. Atherosclerotic disease in both carotid siphon regions with stenosis estimated at 50% bilaterally. No intracranial occlusion or correctable stenosis identified beyond the siphon regions.   Ct Head Wo Contrast 10/08/2016 IMPRESSION: 1. Interval development of multifocal intraparenchymal hemorrhages status post tPA administration. No significant mass effect. 2. ASPECTS is 10   Ct Head Code Stroke W/o Cm 10/07/2016 IMPRESSION: 1. Negative for acute ischemic finding. Mild age related atrophy and chronic small vessel change of the white matter. 2. ASPECTS is 10   CT Head wo 10/09/2016 ; 1. Interval blooming of multifocal intraparenchymal hemorrhages as detailed above, increased in size relative to previous exam. Mild localized vasogenic edema without significant mass effect or midline shift. 2. No other new acute intracranial process.  Mri and Mrv Brain W Wo Contrast 10/12/2016 IMPRESSION: 1. Stable appearance of multiple scattered subcortical hemorrhages as previously described. 2. Surrounding vasogenic edema with some local mass effect, particularly in the left cerebellum. There is no midline shift or herniation. 3. No new hemorrhages are present. 4. No pathologic enhancement. 5. Normal MR venogram of the head.   TTE - Left  ventricle: The cavity size was normal. Wall thickness was   increased in a pattern of mild LVH. Systolic function was normal.   The estimated ejection fraction was in the range of 60% to 65%.   Wall motion was normal; there were no regional wall motion   abnormalities. Doppler parameters are consistent with abnormal   left ventricular relaxation (grade 1 diastolic dysfunction). - Mitral valve: Nodular thickening of the anterior leaflet.   Calcified annulus. - Left atrium: The atrium  was mildly dilated. - Atrial septum: No defect or patent foramen ovale was identified. - Pulmonary arteries: PA peak pressure: 43 mm Hg (S).   PHYSICAL EXAM  Temp:  [98.1 F (36.7 C)-98.7 F (37.1 C)] 98.2 F (36.8 C) (07/16 1001) Pulse Rate:  [62-86] 62 (07/16 1001) Resp:  [17-18] 18 (07/16 1001) BP: (111-170)/(43-59) 136/43 (07/16 1001) SpO2:  [100 %] 100 % (07/16 1001)  General - Well nourished, well developed, in no apparent distress.  Ophthalmologic - Fundi not visualized due to noncooperation.  Cardiovascular - Regular rate and rhythm.  Mental Status -  Level of arousal and orientation to year, place, and person were intact, not orientated to month Language including expression, naming, repetition, comprehension was assessed and found intact. Fund of Knowledge was assessed and was intact.  Cranial Nerves II - XII - II - Visual field intact OU. III, IV, VI - Extraocular movements intact. V - Facial sensation intact bilaterally. VII - Facial movement intact bilaterally. VIII - Hearing & vestibular intact bilaterally. X - Palate elevates symmetrically. XI - Chin turning & shoulder shrug intact bilaterally. XII - Tongue protrusion intact.  Motor Strength - The patient's strength was 5/5 RUE, 5-/5 LUE, 2+/5 BLE and pronator drift was present on the left (all chronic).  Bulk was normal and fasciculations were absent.   Motor Tone - Muscle tone was assessed at the neck and appendages and was normal.  Reflexes - The patient's reflexes were 1+ in all extremities and she had no pathological reflexes.  Sensory - Light touch, temperature/pinprick were assessed and were symmetrical.    Coordination - The patient had normal movements in the hands with no ataxia or dysmetria.  Tremor was absent.  Gait and Station - not tested.   ASSESSMENT/PLAN Julia Floyd is a 83 y.o. female with history of remote stroke, Alzheimer's dementia, trigeminal neuralgia, Type II  diabetes mellitus, and hypertension, who presents with acute onset aphasia. She received IV t-PA at 1936 on 10/07/2016.   Suspect stroke/TIA s/p tPA with multifocal ICH - etiology unclear, concerning for underlying CAA.   Resultant  Confusion and aphasia resolved  CT head: No stroke  CTA head:  Atherosclerosis of bilateral ICAs, left subclavian artery with 50% stenosis, left vertebral artery origin occlusion.  2D Echo: EF 60-65%   MRV negative  MRI - no acute stroke but multifocal hematoma without underlying structural lesion seen. However, the bleeding pattern following tPA concerning for CAA.   LDL 123  HgbA1c 5.1  SCDs for VTE prophylaxis DIET DYS 3 Room service appropriate? Yes; Fluid consistency: Thin  aspirin 325 mg daily prior to admission, now on No antithrombotic due to [place]. Also due to concern of CAA, I would not recommend any antiplatelet or anticoagulation in the future.  Patient counseled to be compliant with her antithrombotic medications  Ongoing aggressive stroke risk factor management  Therapy recommendations:  CIR   Disposition:   pending  Hypertension  Stable  Goal SBP < 160 mmHg  due to IPH   Long-term BP goal normotensive  Hyperlipidemia  Home meds:  none  LDL 123, goal < 70  Add pravastatin 40  Continue on discharge  Diabetes  HgbA1c  5.1, goal < 7.0  Controlled  Other Stroke Risk Factors  Advanced age  Hx of stroke 15 years ago with residue wheelchair bound and left UE mild weakness  Other Active Problems  Wheelchair bound at home PTA  TN  Daughter denies any dementia at home  Hospital day # 6   [name], MD PhD Stroke Neurology 10/13/2016 12:15 PM    To contact Stroke Continuity provider, please refer to [email or web address] After hours, contact General Neurology

## 2016-10-14 ENCOUNTER — Inpatient Hospital Stay (HOSPITAL_COMMUNITY)
Admission: RE | Admit: 2016-10-14 | Discharge: 2016-10-29 | DRG: 057 | Disposition: A | Discharge status: Home Health | Payer: Medicare Other | Source: Intra-hospital | Attending: Physical Medicine & Rehabilitation | Admitting: Physical Medicine & Rehabilitation

## 2016-10-14 ENCOUNTER — Encounter (HOSPITAL_COMMUNITY): Payer: Self-pay

## 2016-10-14 DIAGNOSIS — D62 Acute posthemorrhagic anemia: Secondary | ICD-10-CM | POA: Diagnosis present

## 2016-10-14 DIAGNOSIS — F0280 Dementia in other diseases classified elsewhere without behavioral disturbance: Secondary | ICD-10-CM | POA: Diagnosis present

## 2016-10-14 DIAGNOSIS — I619 Nontraumatic intracerebral hemorrhage, unspecified: Secondary | ICD-10-CM | POA: Diagnosis not present

## 2016-10-14 DIAGNOSIS — R269 Unspecified abnormalities of gait and mobility: Secondary | ICD-10-CM

## 2016-10-14 DIAGNOSIS — K5900 Constipation, unspecified: Secondary | ICD-10-CM | POA: Diagnosis present

## 2016-10-14 DIAGNOSIS — G500 Trigeminal neuralgia: Secondary | ICD-10-CM | POA: Diagnosis present

## 2016-10-14 DIAGNOSIS — E119 Type 2 diabetes mellitus without complications: Secondary | ICD-10-CM

## 2016-10-14 DIAGNOSIS — R32 Unspecified urinary incontinence: Secondary | ICD-10-CM

## 2016-10-14 DIAGNOSIS — E785 Hyperlipidemia, unspecified: Secondary | ICD-10-CM

## 2016-10-14 DIAGNOSIS — R4701 Aphasia: Secondary | ICD-10-CM | POA: Diagnosis present

## 2016-10-14 DIAGNOSIS — Z87891 Personal history of nicotine dependence: Secondary | ICD-10-CM | POA: Diagnosis not present

## 2016-10-14 DIAGNOSIS — K5901 Slow transit constipation: Secondary | ICD-10-CM | POA: Diagnosis not present

## 2016-10-14 DIAGNOSIS — I10 Essential (primary) hypertension: Secondary | ICD-10-CM | POA: Diagnosis present

## 2016-10-14 DIAGNOSIS — F319 Bipolar disorder, unspecified: Secondary | ICD-10-CM | POA: Diagnosis present

## 2016-10-14 DIAGNOSIS — I69220 Aphasia following other nontraumatic intracranial hemorrhage: Secondary | ICD-10-CM | POA: Diagnosis not present

## 2016-10-14 DIAGNOSIS — I69398 Other sequelae of cerebral infarction: Secondary | ICD-10-CM

## 2016-10-14 DIAGNOSIS — M792 Neuralgia and neuritis, unspecified: Secondary | ICD-10-CM | POA: Diagnosis not present

## 2016-10-14 DIAGNOSIS — G309 Alzheimer's disease, unspecified: Secondary | ICD-10-CM | POA: Diagnosis present

## 2016-10-14 DIAGNOSIS — N3941 Urge incontinence: Secondary | ICD-10-CM | POA: Diagnosis not present

## 2016-10-14 LAB — CBC
HCT: 28.8 % — ABNORMAL LOW (ref 36.0–46.0)
HEMOGLOBIN: 8.9 g/dL — AB (ref 12.0–15.0)
MCH: 28.9 pg (ref 26.0–34.0)
MCHC: 30.9 g/dL (ref 30.0–36.0)
MCV: 93.5 fL (ref 78.0–100.0)
PLATELETS: 131 10*3/uL — AB (ref 150–400)
RBC: 3.08 MIL/uL — ABNORMAL LOW (ref 3.87–5.11)
RDW: 16.3 % — ABNORMAL HIGH (ref 11.5–15.5)
WBC: 6 10*3/uL (ref 4.0–10.5)

## 2016-10-14 LAB — BASIC METABOLIC PANEL
Anion gap: 5 (ref 5–15)
CHLORIDE: 106 mmol/L (ref 101–111)
CO2: 27 mmol/L (ref 22–32)
CREATININE: 0.66 mg/dL (ref 0.44–1.00)
Calcium: 8.7 mg/dL — ABNORMAL LOW (ref 8.9–10.3)
GFR calc Af Amer: >60 mL/min (ref >60)
GFR calc non Af Amer: >60 mL/min (ref >60)
Glucose, Bld: 109 mg/dL — ABNORMAL HIGH (ref 65–99)
Potassium: 3.6 mmol/L (ref 3.5–5.1)
SODIUM: 138 mmol/L (ref 135–145)

## 2016-10-14 MED ORDER — AMLODIPINE BESYLATE 5 MG PO TABS: 5.0000 mg | ORAL_TABLET | Freq: Every day | ORAL | Status: DC

## 2016-10-14 MED ORDER — METFORMIN HCL 500 MG PO TABS
500.0000 mg | ORAL_TABLET | Freq: Two times a day (BID) | ORAL | Status: DC
  Administered 2016-10-15 – 2016-10-29 (×29): 500 mg via ORAL
  Filled 2016-10-14 (×29): qty 1

## 2016-10-14 MED ORDER — ONDANSETRON HCL 4 MG PO TABS: 4.0000 mg | ORAL_TABLET | Freq: Four times a day (QID) | ORAL | Status: DC | PRN

## 2016-10-14 MED ORDER — VITAMIN B-12 1000 MCG PO TABS
500.0000 ug | ORAL_TABLET | Freq: Every day | ORAL | Status: DC
  Administered 2016-10-15 – 2016-10-29 (×15): 500 ug via ORAL
  Filled 2016-10-14 (×15): qty 1

## 2016-10-14 MED ORDER — GABAPENTIN 100 MG PO CAPS
100.0000 mg | ORAL_CAPSULE | Freq: Every day | ORAL | Status: DC
  Administered 2016-10-14 – 2016-10-23 (×10): 100 mg via ORAL
  Administered 2016-10-24 – 2016-10-27 (×4): 200 mg via ORAL
  Administered 2016-10-28: 100 mg via ORAL
  Filled 2016-10-14 (×3): qty 1
  Filled 2016-10-14: qty 2
  Filled 2016-10-14: qty 1
  Filled 2016-10-14 (×2): qty 2
  Filled 2016-10-14 (×4): qty 1
  Filled 2016-10-14: qty 2
  Filled 2016-10-14 (×3): qty 1

## 2016-10-14 MED ORDER — PRAVASTATIN SODIUM 40 MG PO TABS
40.0000 mg | ORAL_TABLET | Freq: Every day | ORAL | Status: DC
  Administered 2016-10-15 – 2016-10-28 (×14): 40 mg via ORAL
  Filled 2016-10-14 (×14): qty 1

## 2016-10-14 MED ORDER — SORBITOL 70 % SOLN
30.0000 mL | Freq: Every day | Status: DC | PRN
  Administered 2016-10-26: 30 mL via ORAL
  Filled 2016-10-14: qty 30

## 2016-10-14 MED ORDER — FUROSEMIDE 40 MG PO TABS: 40.0000 mg | ORAL_TABLET | Freq: Every day | ORAL | Status: DC

## 2016-10-14 MED ORDER — AMLODIPINE BESYLATE 10 MG PO TABS
10.0000 mg | ORAL_TABLET | Freq: Every day | ORAL | Status: DC
  Administered 2016-10-15 – 2016-10-29 (×15): 10 mg via ORAL
  Filled 2016-10-14 (×15): qty 1

## 2016-10-14 MED ORDER — FUROSEMIDE 40 MG PO TABS
40.0000 mg | ORAL_TABLET | Freq: Every day | ORAL | Status: DC
  Administered 2016-10-15 – 2016-10-22 (×8): 40 mg via ORAL
  Filled 2016-10-14 (×8): qty 1

## 2016-10-14 MED ORDER — ONDANSETRON HCL 4 MG/2ML IJ SOLN
4.0000 mg | Freq: Four times a day (QID) | INTRAMUSCULAR | Status: DC | PRN
Start: 2016-10-14 — End: 2016-10-29

## 2016-10-14 MED ORDER — ASPIRIN 325 MG PO TABS: 325.0000 mg | ORAL_TABLET | Freq: Every day | ORAL | Status: DC

## 2016-10-14 NOTE — Progress Notes (Signed)
Occupational Therapy Treatment Patient Details Name: Julia Floyd MRN: [id number] DOB: 1931-03-29 Today's Date: 10/14/2016    History of present illness Pt is an 83 y.o. female with a history of remote stroke, Alzheimer's dementia, trigeminal neuralgia, Type II diabetes mellitus, and hypertension, who presented with acute onset aphasia. Pt found to have an unidentified primary lesion with post-tPA hemorrhagic conversion and subsequent diffuse intraparenchymal microhemorrhages.   OT comments  Pt progressing well toward OT goals. Facilitated improved B UE and core strengthening with functional reaching tasks and resistive exercises this session with pt demonstrating significant improvements. Pt and family eager for transition to CIR hopefully today to maximize functional gains and return to PLOF. D/C recommendation remains appropriate.  Follow Up Recommendations  CIR;Supervision/Assistance - 24 hour    Equipment Recommendations  Other (comment) (TBD at next venue )    Recommendations for Other Services Rehab consult    Precautions / Restrictions Precautions Precautions: Fall Restrictions Weight Bearing Restrictions: No       Mobility Bed Mobility               General bed mobility comments: OOB in chair on OT arrival.   Transfers                      Balance Overall balance assessment: Needs assistance Sitting-balance support: Bilateral upper extremity supported;Feet supported Sitting balance-Leahy Scale: Fair                                     ADL either performed or assessed with clinical judgement   ADL                                         General ADL Comments: Session focused on B UE strengthening in preparation for ADL participation.      Vision       Perception     Praxis      Cognition Arousal/Alertness: Awake/alert Behavior During Therapy: WFL for tasks assessed/performed Overall Cognitive  Status: History of cognitive impairments - at baseline                                 General Comments: pt with Alzheimer's dementia at baseline        Exercises Exercises: General Upper Extremity General Exercises - Upper Extremity Shoulder Flexion: AROM;Both;20 reps (2x10) Shoulder Horizontal ABduction: Strengthening;20 reps;Theraband (2x10) Theraband Level (Shoulder Horizontal Abduction): Level 1 (Yellow) Elbow Flexion: Strengthening;Both;20 reps;Theraband (2x10) Theraband Level (Elbow Flexion): Level 1 (Yellow) Chair Push Up: Strengthening;10 reps (with assistance from therapist) Other Exercises Other Exercises: Functional reaching tasks with B UE weightbearing to facilitate improved core strengthening.    Shoulder Instructions       General Comments Pt's daughter and granddaughter present throughout session.     Pertinent Vitals/ Pain       Pain Assessment: No/denies pain  Home Living                                          Prior Functioning/Environment              Frequency  Min 2X/week  Progress Toward Goals  OT Goals(current goals can now be found in the care plan section)  Progress towards OT goals: Progressing toward goals  Acute Rehab OT Goals Patient Stated Goal: return to PLOF (independence with transfers) OT Goal Formulation: With patient Time For Goal Achievement: 10/24/16 Potential to Achieve Goals: Good  Plan Discharge plan remains appropriate;Frequency remains appropriate    Co-evaluation                 AM-PAC PT "6 Clicks" Daily Activity     Outcome Measure   Help from another person eating meals?: None Help from another person taking care of personal grooming?: A Little Help from another person toileting, which includes using toliet, bedpan, or urinal?: Total Help from another person bathing (including washing, rinsing, drying)?: A Lot Help from another person to put on and taking off  regular upper body clothing?: A Little Help from another person to put on and taking off regular lower body clothing?: A Lot 6 Click Score: 15    End of Session    OT Visit Diagnosis: Unsteadiness on feet (R26.81);Muscle weakness (generalized) (M62.81)   Activity Tolerance Patient tolerated treatment well   Patient Left in chair;with call bell/phone within reach;with family/visitor present   Nurse Communication Mobility status        Time: 0511-0211 OT Time Calculation (min): 24 min  Charges: OT General Charges $OT Visit: 1 Procedure OT Treatments $Therapeutic Activity: 8-22 mins $Therapeutic Exercise: 8-22 mins  Norman Herrlich, MS OTR/L  Pager: [place] [facility] Joaquin Knebel 10/14/2016, 5:14 PM

## 2016-10-14 NOTE — Progress Notes (Signed)
Physical Therapy Treatment Patient Details Name: Julia Floyd MRN: [id number] DOB: 01-24-1931 Today's Date: 10/14/2016    History of Present Illness Pt is an 83 y.o. female with a history of remote stroke, Alzheimer's dementia, trigeminal neuralgia, Type II diabetes mellitus, and hypertension, who presented with acute onset aphasia. Pt found to have an unidentified primary lesion with post-tPA hemorrhagic conversion and subsequent diffuse intraparenchymal microhemorrhages.    PT Comments    Patient eager and pleasant during therapy session. Tolerated therapeutic exercises in bed, EOB activity and OOB to chair. Shows less anxiety this session and improved strength/activity tolerance. Will continue to see and progress as tolerated. Current POC remains appropriate.   Follow Up Recommendations  CIR;Supervision/Assistance - 24 hour     Equipment Recommendations  None recommended by PT    Recommendations for Other Services Rehab consult     Precautions / Restrictions Precautions Precautions: Fall Restrictions Weight Bearing Restrictions: No    Mobility  Bed Mobility Overal bed mobility: Needs Assistance Bed Mobility: Supine to Sit     Supine to sit: Mod assist;HOB elevated     General bed mobility comments: patient showing good movement of LEs to EOB, good sequencing of positioning and use of UEs to   Transfers Overall transfer level: Needs assistance Equipment used: 2 person hand held assist (Face to face with gait belt and chuck pad) Transfers: Sit to/from Stand Sit to Stand: +2 safety/equipment;Max assist Stand pivot transfers: Mod assist;+2 physical assistance       General transfer comment: 2 person face to face with gait belt and chuck pad, performed si <> stand x3 prior to SPT to chair. Patient with improved ability to initiate shuffly steps  Ambulation/Gait             General Gait Details: pt uses a w/c for mobility at baseline   Stairs             Wheelchair Mobility    Modified Rankin (Stroke Patients Only) Modified Rankin (Stroke Patients Only) Pre-Morbid Rankin Score: Severe disability Modified Rankin: Severe disability     Balance Overall balance assessment: Needs assistance Sitting-balance support: Bilateral upper extremity supported;Feet supported Sitting balance-Leahy Scale: Fair Sitting balance - Comments: trunk control activities performed during session, improvements noted   Standing balance support: Bilateral upper extremity supported Standing balance-Leahy Scale: Poor                              Cognition Arousal/Alertness: Awake/alert Behavior During Therapy: WFL for tasks assessed/performed Overall Cognitive Status: History of cognitive impairments - at baseline                                 General Comments: pt with Alzheimer's dementia at baseline      Exercises Other Exercises Other Exercises: EOB trunk flexion bilateral reaching Other Exercises: LE SLRs bilaterally Other Exercises: LEs LAQs bilaterally Other Exercises: UE arm raises Bilaterally    General Comments        Pertinent Vitals/Pain Faces Pain Scale: No hurt    Home Living                      Prior Function            PT Goals (current goals can now be found in the care plan section) Acute Rehab PT Goals Patient Stated Goal: return  to PLOF (independence with transfers) PT Goal Formulation: With patient/family Time For Goal Achievement: 10/24/16 Potential to Achieve Goals: Good Progress towards PT goals: Progressing toward goals    Frequency    Min 4X/week      PT Plan Current plan remains appropriate    Co-evaluation              AM-PAC PT "6 Clicks" Daily Activity  Outcome Measure  Difficulty turning over in bed (including adjusting bedclothes, sheets and blankets)?: A Lot Difficulty moving from lying on back to sitting on the side of the bed? :  Total Difficulty sitting down on and standing up from a chair with arms (e.g., wheelchair, bedside commode, etc,.)?: Total Help needed moving to and from a bed to chair (including a wheelchair)?: A Lot Help needed walking in hospital room?: Total Help needed climbing 3-5 steps with a railing? : Total 6 Click Score: 8    End of Session Equipment Utilized During Treatment: Gait belt Activity Tolerance: Patient tolerated treatment well Patient left: in chair;with call bell/phone within reach;with family/visitor present Nurse Communication: Mobility status PT Visit Diagnosis: Other abnormalities of gait and mobility (R26.89);Other symptoms and signs involving the nervous system (W25.749)     Time: 3552-1747 PT Time Calculation (min) (ACUTE ONLY): 21 min  Charges:  $Therapeutic Activity: 8-22 mins                    G Codes:       Alben Deeds, PT DPT [facility] [phone]    Duncan Dull 10/14/2016, 9:28 AM

## 2016-10-14 NOTE — Progress Notes (Addendum)
Inpatient Rehabilitation  Met with patient, daughter, and granddaughter to discuss IP Rehab.  Shared booklets and answered questions.  Patient and family are eager for her to regain as much independence as possible in order to transition home safely.  I have received insurance approval and have a bed to offer patient today.  I await acute medical clearance.  Please call with questions.   Addendum: I have received medical clearance to admit patient to IP Rehab today and will proceed with admission.   Carmelia Roller., CCC/SLP Admission Coordinator  [place]  Cell [phone]

## 2016-10-14 NOTE — Discharge Summary (Signed)
Stroke Discharge Summary  Patient ID: trannie bardales   MRN: [id number]      DOB: [birth year]  Date of Admission: 10/07/2016 Date of Discharge: 10/14/2016  Attending Physician:  [name], MD, Stroke MD Consultant(s):   Rehab Medicine Patient's PCP:  Josetta Huddle, MD  Discharge Diagnoses: Principal Problem:   Stroke (cerebrum) ([place]) -  Suspect stroke/TIA s/p tPA with multifocal ICH - etiology unclear, concerning for underlying CAA Active Problems:   Received tissue plasminogen activator (t-PA) less than 24 hours prior to arrival   [place] (intracerebral hemorrhage) ([place])   Cytotoxic brain edema ([place])  Past Medical History:  Diagnosis Date  . DDD (degenerative disc disease)   . Depression   . Diabetes ([place])   . HTN (hypertension)   . Psychotic disorder    secondary to the below factors. 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 Bipolar disorder, most likely type 3, this episode being manic  . Sixth cranial nerve palsy   . Trigeminal neuralgia   . Uterine fibroid    Past Surgical History:  Procedure Laterality Date  . BRONCHOSCOPY    . TRACHEOSTOMY      Medications to be continued on Rehab . amLODipine  10 mg Oral Daily  . furosemide  40 mg Oral Daily  . pravastatin  40 mg Oral q1800    LABORATORY STUDIES CBC    Component Value Date/Time   WBC 6.0 10/14/2016 0548   RBC 3.08 (L) 10/14/2016 0548   HGB 8.9 (L) 10/14/2016 0548   HCT 28.8 (L) 10/14/2016 0548   PLT 131 (L) 10/14/2016 0548   MCV 93.5 10/14/2016 0548   MCH 28.9 10/14/2016 0548   MCHC 30.9 10/14/2016 0548   RDW 16.3 (H) 10/14/2016 0548   LYMPHSABS 1.9 10/07/2016 1900   MONOABS 1.0 10/07/2016 1900   EOSABS 0.0 10/07/2016 1900   BASOSABS 0.0 10/07/2016 1900   CMP    Component Value Date/Time   NA 138 10/14/2016 0548   K 3.6 10/14/2016 0548   CL 106 10/14/2016 0548   CO2 27 10/14/2016 0548   GLUCOSE 109 (H) 10/14/2016 0548   BUN <5 (L) 10/14/2016 0548   CREATININE 0.66  10/14/2016 0548   CALCIUM 8.7 (L) 10/14/2016 0548   PROT 7.4 10/07/2016 1900   ALBUMIN 3.3 (L) 10/07/2016 1900   AST 16 10/07/2016 1900   ALT 7 (L) 10/07/2016 1900   ALKPHOS 81 10/07/2016 1900   BILITOT 0.6 10/07/2016 1900   GFRNONAA >60 10/14/2016 0548   GFRAA >60 10/14/2016 0548   COAGS Lab Results  Component Value Date   INR 1.03 10/07/2016   INR 1.05 08/18/2016   Lipid Panel    Component Value Date/Time   CHOL 154 10/08/2016 0417   TRIG 263 (H) 10/08/2016 0417   HDL 14 (L) 10/08/2016 0417   CHOLHDL 11.0 10/08/2016 0417   VLDL 53 (H) 10/08/2016 0417   LDLCALC 87 10/08/2016 0417   HgbA1C  Lab Results  Component Value Date   HGBA1C 5.1 10/08/2016   Urinalysis No results found for: COLORURINE, APPEARANCEUR, LABSPEC, PHURINE, GLUCOSEU, HGBUR, BILIRUBINUR, KETONESUR, PROTEINUR, UROBILINOGEN, NITRITE, LEUKOCYTESUR Urine Drug Screen No results found for: LABOPIA, COCAINSCRNUR, LABBENZ, AMPHETMU, THCU, LABBARB  Alcohol Level    Component Value Date/Time   ETH <5 10/08/2016 1417     SIGNIFICANT DIAGNOSTIC STUDIES I have personally reviewed the radiological images below and agree with the radiology interpretations.  Ct Angio Head and neck W and Wo Contrast  10/07/2016 IMPRESSION: No large or medium vessel occlusion identified. Atherosclerotic disease at both carotid bifurcation regions and ICA bulbs. 50% stenosis of the distal ICA bulb all on the right. Maximal stenosis of only 10% of the ICA on the left. Pronounced atherosclerotic disease of the left subclavian artery with maximal stenosis of 50%. 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 Reconstitution in the mid cervical region, giving supply to left PICA only. Dominant right vertebral artery widely patent to the basilar. Atherosclerotic disease in both carotid siphon regions with stenosis estimated at 50% bilaterally. No intracranial occlusion or correctable stenosis identified beyond the siphon regions.   Ct  Head Wo Contrast 10/08/2016 IMPRESSION: 1. Interval development of multifocal intraparenchymal hemorrhages status post tPA administration. No significant mass effect. 2. ASPECTS is 10   Ct Head Code Stroke W/o Cm 10/07/2016 IMPRESSION: 1. Negative for acute ischemic finding. Mild age related atrophy and chronic small vessel change of the white matter. 2. ASPECTS is 10   CT Head wo 10/09/2016 ; 1. Interval blooming of multifocal intraparenchymal hemorrhages as detailed above, increased in size relative to previous exam. Mild localized vasogenic edema without significant mass effect or midline shift. 2. No other new acute intracranial process.  Mri and Mrv Brain W Wo Contrast 10/12/2016 IMPRESSION: 1. Stable appearance of multiple scattered subcortical hemorrhages as previously described. 2. Surrounding vasogenic edema with some local mass effect, particularly in the left cerebellum. There is no midline shift or herniation. 3. No new hemorrhages are present. 4. No pathologic enhancement. 5. Normal MR venogram of the head.   TTE - Left ventricle: The cavity size was normal. Wall thickness was increased in a pattern of mild LVH. Systolic function was normal. The estimated ejection fraction was in the range of 60% to 65%. Wall motion was normal; there were no regional wall motion abnormalities. Doppler parameters are consistent with abnormal left ventricular relaxation (grade 1 diastolic dysfunction). - Mitral valve: Nodular thickening of the anterior leaflet. Calcified annulus. - Left atrium: The atrium was mildly dilated. - Atrial septum: No defect or patent foramen ovale was identified. - Pulmonary arteries: PA peak pressure: 43 mm Hg (S).    HISTORY OF PRESENT ILLNESS Latonyia A Lattimoreis a 83 y.o.femalewith a history of remote stroke, Alzheimer's dementia, trigeminal neuralgia, Type II diabetes mellitus, and hypertension, who presented with acute onset aphasia.  She was  riding in the car with her daughter around 6 PM, and suddenly stopped talking, and was looking out of the car window, but would not respond to questions.  Her daughter noted right facial droop at onset, which has improved some since it began.  On arrival to the emergency department, could stroke was called.  She presented with aphasia, a mild flattening of right nasolabial fold, and an initial NIHSS of 7.  The patient received tPA at 1936 on 10/07/2016   She was hypertensive (193/43mmHg) on arrival to the Neuro ICU and was placed on a cardene gtt.  The patient's exam declined, a stat head CT showed hemorrhagic transformation with a diffuse distribution of micro-hemmorhage suggestive of underlying amyloid angiopathy.  HOSPITAL COURSE Ms. PANDORA MCCRACKIN is a 83 y.o. female with history of remote stroke, Alzheimer's dementia, trigeminal neuralgia, Type II diabetes mellitus, and hypertension, who presents with acute onset aphasia. She received IV t-PA at 1936 on 10/07/2016.   Suspect stroke/TIA s/p tPA with multifocal ICH - etiology unclear, concerning for underlying CAA.   Resultant  Confusion and aphasia resolved  CT  head: No stroke  CTA head:  Atherosclerosis of bilateral ICAs, left subclavian artery with 50% stenosis, left vertebral artery origin occlusion.  2D Echo: EF 60-65%   MRV negative  MRI - no acute stroke but multifocal hematoma without underlying structural lesion seen. However, the bleeding pattern following tPA concerning for CAA.   LDL 123  HgbA1c 5.1  SCDs for VTE prophylaxis  DIET DYS 3 Room service appropriate? Yes; Fluid consistency: Thin  aspirin 325 mg daily prior to admission, now on No antithrombotic due to [place]. Also due to concern of CAA, I would not recommend any antiplatelet or anticoagulation in the future.  Patient counseled to be compliant with her antithrombotic medications  Ongoing aggressive stroke risk factor management  Therapy recommendations:   CIR   Disposition:   pending  Hypertension  Stable     Long-term BP goal normotensive  Hyperlipidemia  Home meds:  none  LDL 123, goal < 70  Add pravastatin 40  Continue on discharge  Diabetes  HgbA1c  5.1, goal < 7.0  Controlled  Other Stroke Risk Factors  Advanced age  Hx of stroke 15 years ago with residue wheelchair bound and left UE mild weakness  Other Active Problems  Wheelchair bound at home PTA  TN  Daughter denies any dementia at home  DISCHARGE EXAM Blood pressure (!) 112/42, pulse 75, temperature 99 F (37.2 C), temperature source Oral, resp. rate 16, height 5\' 6"  (1.676 m), weight 71.5 kg (157 lb 10.1 oz), SpO2 100 %.  General - Well nourished, well developed, in no apparent distress.  Ophthalmologic - Fundi not visualized due to noncooperation.  Cardiovascular - Regular rate and rhythm.  Mental Status -  Level of arousal and orientation to year, place, and person were intact, not orientated to month Language including expression, naming, repetition, comprehension was assessed and found intact. Fund of Knowledge was assessed and was intact.  Cranial Nerves II - XII - II - Visual field intact OU. III, IV, VI - Extraocular movements intact. V - Facial sensation intact bilaterally. VII - Facial movement intact bilaterally. VIII - Hearing & vestibular intact bilaterally. X - Palate elevates symmetrically. XI - Chin turning & shoulder shrug intact bilaterally. XII - Tongue protrusion intact.  Motor Strength - The patient's strength was 5/5 RUE, 5-/5 LUE, 2+/5 BLE and pronator drift was present on the left (all chronic).  Bulk was normal and fasciculations were absent.   Motor Tone - Muscle tone was assessed at the neck and appendages and was normal.  Reflexes - The patient's reflexes were 1+ in all extremities and she had no pathological reflexes.  Sensory - Light touch, temperature/pinprick were assessed and were symmetrical.     Coordination - The patient had normal movements in the hands with no ataxia or dysmetria.  Tremor was absent.  Gait and Station - not tested.  Discharge Diet  DIET DYS 3 Room service appropriate? Yes; Fluid consistency: Thin liquids  DISCHARGE PLAN  Disposition:  Transfer to [facility] for ongoing PT, OT and ST  No antithrombotic for secondary stroke prevention.  Recommend ongoing risk factor control by Primary Care Physician at time of discharge from inpatient rehabilitation.  Follow-up Josetta Huddle, MD in 2 weeks following discharge from rehab.  Follow-up with Dr. Antony Contras, Stroke Clinic in 6 weeks, office to schedule an appointment.   35 minutes were spent preparing discharge.  [name], MD PhD Stroke Neurology 10/14/2016 5:21 PM

## 2016-10-14 NOTE — PMR Pre-admission (Signed)
PMR Admission Coordinator Pre-Admission Assessment  Patient: Julia Floyd is an 83 y.o., female MRN: [id number] DOB: 07-07-1930 Height: 5\' 6"  (167.6 cm) Weight: 71.5 kg (157 lb 10.1 oz)              Insurance Information HMO: X    PPO:      PCP:      IPA:      80/20:      OTHER:  PRIMARY: [employer] Medicare       Policy#: [id number]      Subscriber:  CM Name: Vevelyn Royals      Phone#: [phone]     Fax#: [phone] Pre-Cert#: [id number] given by Orvan July     Employer: Retired  Benefits:  Phone #: Verified online     Name:  Irene Shipper. Date: 09/28/16     Deduct: $0      Out of Pocket Max: [phone]      Life Max: N/A CIR: $430 a day, days 1-4; $0 a day, days 5+      SNF: $0 a day, days 1-20; $160 a day, days 21-62; $0 a day, days 63-100 Outpatient: [facility]      Co-Pay: $40 per visit  Home Health: 100%      Co-Pay: None DME: 80%     Co-Pay: 20%  Providers: In-network   Medicaid Application Date:       Case Manager:  Disability Application Date:       Case Worker:   Emergency Contact Information Contact Information    Name Relation Home Work Mobile   Richardson,Tanisha Grandaughter   [phone]   [place], [place]   [phone]   [place] [phone]       Current Medical History  Patient Admitting Diagnosis: Multifocal intraparenchymal hemorrhages  History of Present Illness: Maurina A Lattimoreis an 83 y.o.right handed femalewith history of remote stroke, Alzheimer's dementia, trigeminal neuralgia, diabetes mellitus and hypertension. Per chart review patient lives with her daughter who helped intermittently prior to admission. One level home with ramped entrance due to patient being wheelchair level prior to admission. She has a home health aide 2 hours daily to assist with ADLs. Patient performs sit to stand and stand pivot transfers modified independent prior to admission. Presented 10/07/2016 with altered mental status and inability to speak. Cranial CT scan  negative for acute changes. Patient did receive TPA. CT angiogram head and neck showed no large or medium vessel occlusion identified. Left subclavian artery with 50% stenosis. Follow-up CTI showed interval development of multifocal intraparenchymal hemorrhages no significant mass effect. MRI showed no new hemorrhage is present there was some surrounding vasogenic edema with small local mass effect particularly in the left cerebellum. Echocardiogram with ejection fraction 65% grade 1 diastolic dysfunction. Neurology follow-up with close monitoring. No current anti-thrombotic due to [place]. Dysphagia 3 textures and thin liquids currently recommended.  Physical therapy evaluation completed with recommendations of physical medicine rehabilitation consult. Patient was admitted for comprehensive rehabilitation program 10/11/16.  NIH Total: 6    Past Medical History  Past Medical History:  Diagnosis Date  . DDD (degenerative disc disease)   . Depression   . Diabetes ([place])   . HTN (hypertension)   . Psychotic disorder    secondary to the below factors. 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 Bipolar disorder, most likely type 3, this episode being manic  . Sixth cranial nerve palsy   . Trigeminal neuralgia   . Uterine fibroid     Family History  family history includes Diabetes in her unknown relative; Heart attack in her unknown relative.  Prior Rehab/Hospitalizations:  Has the patient had major surgery during 100 days prior to admission? No  Current Medications   Current Facility-Administered Medications:  .  0.9 %  sodium chloride infusion, , Intravenous, Continuous, [name], MD, Last Rate: 40 mL/hr at 10/11/16 2222 .  acetaminophen (TYLENOL) tablet 650 mg, 650 mg, Oral, Q4H PRN **OR** acetaminophen (TYLENOL) solution 650 mg, 650 mg, Per Tube, Q4H PRN **OR** acetaminophen (TYLENOL) suppository 650 mg, 650 mg, Rectal, Q4H PRN, Greta Doom, MD .  amLODipine  (NORVASC) tablet 10 mg, 10 mg, Oral, Daily, [name], MD, 10 mg at 10/14/16 1030 .  furosemide (LASIX) tablet 40 mg, 40 mg, Oral, Daily, Patteson, Samuel A, NP, 40 mg at 10/14/16 1030 .  labetalol (NORMODYNE,TRANDATE) injection 20 mg, 20 mg, Intravenous, Q4H PRN, Patteson, Samuel A, NP .  pantoprazole (PROTONIX) EC tablet 40 mg, 40 mg, Oral, Daily, [name], MD, 40 mg at 10/14/16 1031 .  pravastatin (PRAVACHOL) tablet 40 mg, 40 mg, Oral, q1800, [name], MD, 40 mg at 10/13/16 1843  Patients Current Diet: DIET DYS 3 Room service appropriate? Yes; Fluid consistency: Thin  Precautions / Restrictions Precautions Precautions: Fall Restrictions Weight Bearing Restrictions: No   Has the patient had 2 or more falls or a fall with injury in the past year?No  Prior Activity Level Household: Patient mostly home and reports going out for medical appointments.    Home Assistive Devices / Equipment Home Assistive Devices/Equipment: Wheelchair Home Equipment: Bedside commode, Tub bench, Grab bars - toilet, Wheelchair - power, Hospital bed  Prior Device Use: Indicate devices/aids used by the patient prior to current illness, exacerbation or injury? Manual wheelchair  Prior Functional Level Prior Function Level of Independence: Needs assistance Gait / Transfers Assistance Needed: per family, pt performed sit<>stand and stand-pivot transfers with mod I ADL's / Homemaking Assistance Needed: assist with ADLs from aide that comes for 2 hours every morning and daughters, feeds self  Self Care: Did the patient need help bathing, dressing, using the toilet or eating? Needed some help  Indoor Mobility: Did the patient need assistance with walking from room to room (with or without device)? Needed some help  Stairs: Did the patient need assistance with internal or external stairs (with or without device)? Dependent  Functional Cognition: Did the patient need help planning regular tasks such as  shopping or remembering to take medications? Needed some help  Current Functional Level Cognition  Overall Cognitive Status: History of cognitive impairments - at baseline Orientation Level: Oriented to person, Oriented to place, Oriented to situation General Comments: pt with Alzheimer's dementia at baseline    Extremity Assessment (includes Sensation/Coordination)  Upper Extremity Assessment: Defer to OT evaluation  Lower Extremity Assessment: Generalized weakness    ADLs  Overall ADL's : Needs assistance/impaired Eating/Feeding: Set up, Sitting Grooming: Minimal assistance, Sitting Upper Body Bathing: Moderate assistance Lower Body Bathing: Moderate assistance Upper Body Dressing : Minimal assistance Lower Body Dressing: Maximal assistance Toilet Transfer: +2 for physical assistance, +2 for safety/equipment, Maximal assistance (STEDY used for Pt and therapist safety, NT assisting) Toilet Transfer Details (indicate cue type and reason): simulated through transfer to bed Toileting- Clothing Manipulation and Hygiene:  (Pt currently using external female catheter) Toileting - Clothing Manipulation Details (indicate cue type and reason): Pt currently using external catheter Tub/ Shower Transfer: Maximal assistance, +2 for physical assistance, Stand-pivot, Tub bench General ADL Comments: Pt with  siginificant decreased independence in transfers necessary for ADL    Mobility  Overal bed mobility: Needs Assistance Bed Mobility: Supine to Sit Supine to sit: Mod assist, HOB elevated Sit to supine: Max assist General bed mobility comments: patient showing good movement of LEs to EOB, good sequencing of positioning and use of UEs to     Transfers  Overall transfer level: Needs assistance Equipment used: 2 person hand held assist (Face to face with gait belt and chuck pad) Transfer via Lift Equipment: Stedy Transfers: Sit to/from Stand Sit to Stand: +2 safety/equipment, Max assist Stand  pivot transfers: Mod assist, +2 physical assistance General transfer comment: 2 person face to face with gait belt and chuck pad, performed si <> stand x3 prior to SPT to chair. Patient with improved ability to initiate shuffly steps    Ambulation / Gait / Stairs / Wheelchair Mobility  Ambulation/Gait General Gait Details: pt uses a w/c for mobility at baseline    Posture / Balance Dynamic Sitting Balance Sitting balance - Comments: trunk control activities performed during session, improvements noted Balance Overall balance assessment: Needs assistance Sitting-balance support: Bilateral upper extremity supported, Feet supported Sitting balance-Leahy Scale: Fair Sitting balance - Comments: trunk control activities performed during session, improvements noted Standing balance support: Bilateral upper extremity supported Standing balance-Leahy Scale: Poor Standing balance comment: reliant on support from staff/STEDY    Special needs/care consideration BiPAP/CPAP: No CPM: No Continuous Drip IV: No Dialysis: No         Life Vest: No Oxygen: No Special Bed: no Trach Size: No Wound Vac (area): No       Skin: Dry, WDL                               Bowel mgmt: 10/13/16, incontinent  Bladder mgmt: external foley, incontinent with use of depends PTA Diabetic mgmt: HgbA1c  5.1, goal < 7.0 Yes, managed with diet and oral medication PTA     Previous Home Environment Living Arrangements: Children  Lives With: Daughter Available Help at Discharge: Family, Available 24 hours/day Type of Home: House Home Layout: One level Home Access: Ramped entrance Bathroom Shower/Tub: Tub/shower unit, Other (comment) (Pt taking sponge baths) Bathroom Toilet: Standard Home Care Services: No Additional Comments: Pt has an aide that comes every morning for 2 hours to assist with ADL  Discharge Living Setting Plans for Discharge Living Setting: Patient's home Type of Home at Discharge: House Discharge  Home Layout: One level, Able to live on main level with bedroom/bathroom (with a basement ) Discharge Home Access: Ramped entrance Discharge Bathroom Shower/Tub: Tub/shower unit (patient sink bathing PTA) Discharge Bathroom Toilet:  (patient used BSC PTA) Discharge Bathroom Accessibility: Yes How Accessible: Accessible via wheelchair (recently renovated ) Does the patient have any problems obtaining your medications?: No  Social/Family/Support Systems Patient Roles: Parent, Other (Comment) (Grandparent ) Contact Information: Alexis Frock, granddaughter  Anticipated Caregiver: Yvetta Coder, Daughter Paulette Anticipated Caregiver's Contact Information: [phone] Ability/Limitations of Caregiver: Yvetta Coder works, but will coordinate care with aide and daughter Jeanne Ivan, who lives with patient and was able to assist intermittently PTA. Patient also with aide 2 hours a day to assist with bathing and dressing. Yvetta Coder and Paulette aware of likely need for 24/7 supervision recommendation upon discharge from CIR.  Caregiver Availability: 24/7 Discharge Plan Discussed with Primary Caregiver: Yes Is Caregiver In Agreement with Plan?: Yes Does Caregiver/Family have Issues with Lodging/Transportation while Pt is in Rehab?: No  Goals/Additional Needs Patient/Family Goal for Rehab: PT/OT ModI I-Supervision; SLP Mod I Expected length of stay: 14-18 days  Cultural Considerations: None Dietary Needs: Dys.3 textures and thin liquids  Equipment Needs: TBD Special Service Needs: None Additional Information: Patient mostly wheelchair level in home PTA Pt/Family Agrees to Admission and willing to participate: Yes Program Orientation Provided & Reviewed with Pt/Caregiver Including Roles  & Responsibilities: Yes Additional Information Needs: None Information Needs to be Provided By: N/A  Decrease burden of Care through IP rehab admission: No  Possible need for SNF placement upon discharge: No  Patient  Condition: This patient's condition remains as documented in the consult dated 10/13/16, in which the Rehabilitation Physician determined and documented that the patient's condition is appropriate for intensive rehabilitative care in an inpatient rehabilitation facility. Will admit to inpatient rehab today.  Preadmission Screen Completed By:  Gunnar Fusi, 10/14/2016 11:47 AM ______________________________________________________________________   Discussed status with Dr. Naaman Plummer on 10/14/16 at 2 and received telephone approval for admission today.  Admission Coordinator:  Gunnar Fusi, time 1110/Date 10/14/16

## 2016-10-15 ENCOUNTER — Encounter (HOSPITAL_COMMUNITY): Payer: Self-pay

## 2016-10-15 ENCOUNTER — Inpatient Hospital Stay (HOSPITAL_COMMUNITY): Payer: Medicare Other | Admitting: Occupational Therapy

## 2016-10-15 ENCOUNTER — Inpatient Hospital Stay (HOSPITAL_COMMUNITY): Payer: Medicare Other | Admitting: Speech Pathology

## 2016-10-15 ENCOUNTER — Inpatient Hospital Stay (HOSPITAL_COMMUNITY): Payer: Medicare Other | Admitting: Physical Therapy

## 2016-10-15 DIAGNOSIS — I69398 Other sequelae of cerebral infarction: Secondary | ICD-10-CM

## 2016-10-15 DIAGNOSIS — I619 Nontraumatic intracerebral hemorrhage, unspecified: Secondary | ICD-10-CM

## 2016-10-15 DIAGNOSIS — R269 Unspecified abnormalities of gait and mobility: Secondary | ICD-10-CM

## 2016-10-15 LAB — CBC WITH DIFFERENTIAL/PLATELET
Basophils Absolute: 0 10*3/uL (ref 0.0–0.1)
Basophils Relative: 0 %
EOS PCT: 0 %
Eosinophils Absolute: 0 10*3/uL (ref 0.0–0.7)
HEMATOCRIT: 27.7 % — AB (ref 36.0–46.0)
Hemoglobin: 8.5 g/dL — ABNORMAL LOW (ref 12.0–15.0)
LYMPHS ABS: 1.2 10*3/uL (ref 0.7–4.0)
Lymphocytes Relative: 24 %
MCH: 29.1 pg (ref 26.0–34.0)
MCHC: 30.7 g/dL (ref 30.0–36.0)
MCV: 94.9 fL (ref 78.0–100.0)
MONO ABS: 1.3 10*3/uL — AB (ref 0.1–1.0)
MONOS PCT: 27 %
NEUTROS ABS: 2.3 10*3/uL (ref 1.7–7.7)
Neutrophils Relative %: 49 %
PLATELETS: 131 10*3/uL — AB (ref 150–400)
RBC: 2.92 MIL/uL — ABNORMAL LOW (ref 3.87–5.11)
RDW: 16 % — AB (ref 11.5–15.5)
WBC: 4.8 10*3/uL (ref 4.0–10.5)

## 2016-10-15 LAB — COMPREHENSIVE METABOLIC PANEL
ALT: 9 U/L — ABNORMAL LOW (ref 14–54)
ANION GAP: 3 — AB (ref 5–15)
AST: 18 U/L (ref 15–41)
Albumin: 2.5 g/dL — ABNORMAL LOW (ref 3.5–5.0)
Alkaline Phosphatase: 64 U/L (ref 38–126)
BILIRUBIN TOTAL: 0.8 mg/dL (ref 0.3–1.2)
BUN: 5 mg/dL — AB (ref 6–20)
CHLORIDE: 107 mmol/L (ref 101–111)
CO2: 28 mmol/L (ref 22–32)
Calcium: 8.6 mg/dL — ABNORMAL LOW (ref 8.9–10.3)
Creatinine, Ser: 0.69 mg/dL (ref 0.44–1.00)
Glucose, Bld: 118 mg/dL — ABNORMAL HIGH (ref 65–99)
POTASSIUM: 3.7 mmol/L (ref 3.5–5.1)
Sodium: 138 mmol/L (ref 135–145)
TOTAL PROTEIN: 6.2 g/dL — AB (ref 6.5–8.1)

## 2016-10-15 LAB — PATHOLOGIST SMEAR REVIEW

## 2016-10-15 MED ORDER — PRO-STAT SUGAR FREE PO LIQD
30.0000 mL | Freq: Two times a day (BID) | ORAL | Status: DC
  Administered 2016-10-15 – 2016-10-29 (×28): 30 mL via ORAL
  Filled 2016-10-15 (×28): qty 30

## 2016-10-15 NOTE — Evaluation (Signed)
Occupational Therapy Assessment and Plan  Patient Details  Name: Julia Floyd MRN: [id number] Date of Birth: 1930-07-27  OT Diagnosis: abnormal posture and muscle weakness (generalized) Rehab Potential: Rehab Potential (ACUTE ONLY): Good ELOS: 2 weeks   Today's Date: 10/15/2016 OT Individual Time: 1400-1505 OT Individual Time Calculation (min): 65 min     Problem List:  Patient Active Problem List   Diagnosis Date Noted  . Gait disturbance, post-stroke   . Intraparenchymal hemorrhage of brain ([place]) 10/14/2016  . Received tissue plasminogen activator (t-PA) less than 24 hours prior to arrival 10/09/2016  . ICH (intracerebral hemorrhage) ([place]) 10/09/2016  . Cytotoxic brain edema ([place]) 10/09/2016  . Stroke (cerebrum) ([place]) -  Suspect stroke/TIA s/p tPA with multifocal ICH - etiology unclear, concerning for underlying CAA 10/07/2016  . HTN (hypertension)   . Diabetes ([place])   . Depression   . DDD (degenerative disc disease)   . Uterine fibroid   . Trigeminal neuralgia   . Sixth cranial nerve palsy     Past Medical History:  Past Medical History:  Diagnosis Date  . DDD (degenerative disc disease)   . Depression   . Diabetes ([place])   . HTN (hypertension)   . Psychotic disorder    secondary to the below factors. 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 Bipolar disorder, most likely type 3, this episode being manic  . Sixth cranial nerve palsy   . Trigeminal neuralgia   . Uterine fibroid    Past Surgical History:  Past Surgical History:  Procedure Laterality Date  . BRONCHOSCOPY    . TRACHEOSTOMY      Assessment & Plan Clinical Impression:  Julia A Lattimoreis a 83 y.o.right handed femalewith history of remote stroke, Alzheimer's dementia, trigeminal neuralgia, diabetes mellitus and hypertension. Per chart review patient lives with family. One level home with ramped entrance. She has a home health aide 2 hours daily to assist with ADLs. Patient  performs sit to stand and stand pivot transfers modified independent prior to admission. Presented 10/07/2016 with altered mental status and inability to speak. Cranial CT scan negative for acute changes. Patient did receive TPA. CT angiogram head and neck showed no large or medium vessel occlusion identified. Left subclavian artery with 50% stenosis. Follow-up CTI showed interval development of multifocal intraparenchymal hemorrhages no significant mass effect. MRI showed no new hemorrhage is present there was some surrounding vasogenic edema with small local mass effect particularly in the left cerebellum. Echocardiogram with ejection fraction 62% grade 1 diastolic dysfunction. Neurology follow-up with close monitoring. No current anti-thrombotic due to [place]. Dysphagia #3 thin liquid diet. Physical therapy evaluation completed with recommendations of physical medicine rehabilitation consult.Patient was admitted for comprehensive rehabilitation program. Patient transferred to CIR on 10/14/2016 .    Patient currently requires max with basic self-care skills secondary to muscle weakness, decreased cardiorespiratoy endurance and decreased sitting balance, decreased standing balance, decreased postural control and decreased balance strategies.  Prior to hospitalization, patient could complete ADLs with min.  Patient will benefit from skilled intervention to decrease level of assist with basic self-care skills and increase independence with basic self-care skills prior to discharge home with care partner.  Anticipate patient will require moderate physical assestance and follow up home health.  OT - End of Session Activity Tolerance: Tolerates 10 - 20 min activity with multiple rests Endurance Deficit: Yes OT Assessment Rehab Potential (ACUTE ONLY): Good OT Basic ADL's Functional Problem(s): Grooming;Bathing;Dressing;Toileting OT Transfers Functional Problem(s): Toilet OT Additional Impairment(s): None OT  Plan OT Intensity: Minimum of 1-2 x/day, 45 to 90 minutes OT Frequency: 5 out of 7 days OT Duration/Estimated Length of Stay: 2 weeks OT Treatment/Interventions: Balance/vestibular training;Discharge planning;DME/adaptive equipment instruction;Disease mangement/prevention;Functional electrical stimulation;Functional mobility training;Neuromuscular re-education;Pain management;Patient/family education;Psychosocial support;Self Care/advanced ADL retraining;Therapeutic Activities;Therapeutic Exercise;UE/LE Strength taining/ROM;UE/LE Coordination activities;Wheelchair propulsion/positioning OT Self Feeding Anticipated Outcome(s): Set-up OT Basic Self-Care Anticipated Outcome(s): Min  OT Toileting Anticipated Outcome(s): Min A OT Bathroom Transfers Anticipated Outcome(s): Min A OT Recommendation Patient destination: Home Follow Up Recommendations: 24 hour supervision/assistance;Home health OT Equipment Recommended: To be determined   Skilled Therapeutic Intervention Pt seen for OT Eval and ADL session. Pt received in w/c with hand off from SLP. Required total A to recall room location. Completed bathing task from w/c  Level at sink. Pt able to recall home layout though unsure of accuracy of reporting of PLOF as pt reports "I'm doing better now than I did at home". Following session followed up with admission coordinator who reports pt was completing transfers mod I PTA. Pt currently requiring max- total A for transfers during session.  She completed stand pivot transfer to [facility] over toilet with heavy reliance on grab bars.  Required total A for toileting tasks. She requested to sit in recliner at end of session, used STEDY with +2 assist to transfer to recliner. Left sitting in recliner with all needs in reach, educated regarding use of call bell, role of OT, POC, and d/c planning.   OT Evaluation Precautions/Restrictions  Precautions Precautions: Fall Precaution Comments: General weakness L>R;  dementia Restrictions Weight Bearing Restrictions: No General Chart Reviewed: Yes Additional Pertinent History: Dementia Pain   No/ denies pain Home Living/Prior Functioning Home Living Family/patient expects to be discharged to:: Private residence Living Arrangements: Children, Other relatives Available Help at Discharge: Family, Available 24 hours/day Type of Home: House Home Access: Ramped entrance Home Layout: One level Bathroom Shower/Tub: Tub/shower unit, Other (comment) (Pt taking sponge bathing) Bathroom Toilet: Standard Bathroom Accessibility: Yes Additional Comments: Pt has an aide that comes every morning for 2 hours to assist with ADL  Lives With: Daughter IADL History Homemaking Responsibilities: No Prior Function Level of Independence: Needs assistance with ADLs, Other (comment) (Used power w/c PTA; HHA 2 hrs in the AM to assist with ADLs)  Able to Take Stairs?: No Driving: No Vocation: On disability Vision Baseline Vision/History: Wears glasses Wears Glasses: Reading only Patient Visual Report: No change from baseline Vision Assessment?: No apparent visual deficits Perception  Perception: Within Functional Limits Praxis Praxis: Intact Cognition Overall Cognitive Status: History of cognitive impairments - at baseline Arousal/Alertness: Awake/alert Orientation Level: Person;Place;Situation Person: Oriented Place: Oriented Situation: Oriented Year: 2018 Month: June Day of Week: Correct Memory: Impaired Memory Impairment: Decreased recall of new information;Decreased short term memory Decreased Short Term Memory: Verbal basic;Functional basic Immediate Memory Recall: Sock;Blue;Bed Memory Recall: Blue;Bed Memory Recall Blue: Without Cue Memory Recall Bed: Without Cue Attention: Sustained Sustained Attention: Appears intact Awareness: Impaired Awareness Impairment: Emergent impairment Problem Solving: Appears intact Safety/Judgment: Appears  intact Sensation Sensation Light Touch: Impaired by gross assessment Proprioception: Impaired by gross assessment Coordination Gross Motor Movements are Fluid and Coordinated: No Fine Motor Movements are Fluid and Coordinated: Yes Coordination and Movement Description: mild discordination due to decreased strength.  Finger Nose Finger Test: B decreased smootheness of movement pursuits  Motor  Motor Motor: Hemiplegia;Abnormal postural alignment and control Motor - Skilled Clinical Observations: General weakness, L>R ; Very kyphotic unable to come into erect stand Trunk/Postural Assessment  Cervical Assessment  Cervical Assessment: Exceptions to [facility] (Forward head) Thoracic Assessment Thoracic Assessment: Exceptions to [facility] (very kyphotic) Lumbar Assessment Lumbar Assessment: Exceptions to [facility] (Posterior pelvic tilt ) Postural Control Postural Control: Deficits on evaluation  Balance Balance Balance Assessed: Yes Static Sitting Balance Static Sitting - Balance Support: Feet supported Static Sitting - Level of Assistance: 4: Min assist;5: Stand by assistance Static Sitting - Comment/# of Minutes: LOB sitting in w/c completing bathing tasks Dynamic Sitting Balance Dynamic Sitting - Balance Support: During functional activity;Feet supported Dynamic Sitting - Level of Assistance: 4: Min assist Sitting balance - Comments: LOB sitting in w/c completing bathing tasks Static Standing Balance Static Standing - Balance Support: During functional activity;Bilateral upper extremity supported Static Standing - Level of Assistance: 2: Max assist;1: +1 Total assist Static Stance: on Foam, Eyes Closed: Standing for hygiene and clothing management to be completed; B UE support on grab bar Dynamic Standing Balance Dynamic Standing - Balance Support: During functional activity;Bilateral upper extremity supported Dynamic Standing - Level of Assistance: 2: Max assist;1: +1 Total assist Dynamic  Standing - Comments: Standing for hygiene and clothing management to be completed; B UE support on grab bar Extremity/Trunk Assessment RUE Assessment RUE Assessment: Exceptions to WFL (Able to acheive ~100 degrees shoulder flexion; strength WFL) LUE Assessment LUE Assessment: Exceptions to WFL (Able to acheive ~100 degrees shoulder flexion; strength WFL)   See Function Navigator for Current Functional Status.   Refer to Care Plan for Long Term Goals  Recommendations for other services: None    Discharge Criteria: Patient will be discharged from OT if patient refuses treatment 3 consecutive times without medical reason, if treatment goals not met, if there is a change in medical status, if patient makes no progress towards goals or if patient is discharged from hospital.  The above assessment, treatment plan, treatment alternatives and goals were discussed and mutually agreed upon: by patient  Ernestina Patches 10/15/2016, 3:36 PM

## 2016-10-15 NOTE — Progress Notes (Signed)
Subjective/Complaints: Uses power chair at home, can transfer independantly PTA, now needing max A At cognitive baseline per SLP/family  ROS- denies CP/SOB, - N/V/D  Objective: Vital Signs: Blood pressure (!) 140/52, pulse 67, temperature 98.4 F (36.9 C), temperature source Oral, resp. rate 17, height 5' 6"  (1.676 m), weight 78.4 kg (172 lb 14.9 oz), SpO2 100 %. No results found. Results for orders placed or performed during the hospital encounter of 10/14/16 (from the past 72 hour(s))  CBC WITH DIFFERENTIAL     Status: Abnormal (Preliminary result)   Collection Time: 10/15/16  4:38 AM  Result Value Ref Range   WBC 4.8 4.0 - 10.5 K/uL   RBC 2.92 (L) 3.87 - 5.11 MIL/uL   Hemoglobin 8.5 (L) 12.0 - 15.0 g/dL   HCT 27.7 (L) 36.0 - 46.0 %   MCV 94.9 78.0 - 100.0 fL   MCH 29.1 26.0 - 34.0 pg   MCHC 30.7 30.0 - 36.0 g/dL   RDW 16.0 (H) 11.5 - 15.5 %   Platelets 131 (L) 150 - 400 K/uL   Neutrophils Relative % PENDING %   Neutro Abs PENDING 1.7 - 7.7 K/uL   Band Neutrophils PENDING %   Lymphocytes Relative PENDING %   Lymphs Abs PENDING 0.7 - 4.0 K/uL   Monocytes Relative PENDING %   Monocytes Absolute PENDING 0.1 - 1.0 K/uL   Eosinophils Relative PENDING %   Eosinophils Absolute PENDING 0.0 - 0.7 K/uL   Basophils Relative PENDING %   Basophils Absolute PENDING 0.0 - 0.1 K/uL   WBC Morphology PENDING    RBC Morphology PENDING    Smear Review PENDING    nRBC PENDING 0 /100 WBC   Metamyelocytes Relative PENDING %   Myelocytes PENDING %   Promyelocytes Absolute PENDING %   Blasts PENDING %  Comprehensive metabolic panel     Status: Abnormal   Collection Time: 10/15/16  4:38 AM  Result Value Ref Range   Sodium 138 135 - 145 mmol/L   Potassium 3.7 3.5 - 5.1 mmol/L   Chloride 107 101 - 111 mmol/L   CO2 28 22 - 32 mmol/L   Glucose, Bld 118 (H) 65 - 99 mg/dL   BUN 5 (L) 6 - 20 mg/dL   Creatinine, Ser 0.69 0.44 - 1.00 mg/dL   Calcium 8.6 (L) 8.9 - 10.3 mg/dL   Total Protein 6.2  (L) 6.5 - 8.1 g/dL   Albumin 2.5 (L) 3.5 - 5.0 g/dL   AST 18 15 - 41 U/L   ALT 9 (L) 14 - 54 U/L   Alkaline Phosphatase 64 38 - 126 U/L   Total Bilirubin 0.8 0.3 - 1.2 mg/dL   GFR calc non Af Amer >60 >60 mL/min   GFR calc Af Amer >60 >60 mL/min    Comment: (NOTE) The eGFR has been calculated using the CKD EPI equation. This calculation has not been validated in all clinical situations. eGFR's persistently <60 mL/min signify possible Chronic Kidney Disease.    Anion gap 3 (L) 5 - 15     HEENT: normal Cardio: RRR and no murmur Resp: CTA B/L and unlabored GI: BS positive and NT, ND Extremity:  No Edema Skin:   Intact Neuro: Alert/Oriented, Abnormal Sensory LT diminished on left side and Abnormal Motor 4/5 BUE, 4- BLE, 3- LLE Musc/Skel:  Other no pain with UE or LE ROM Gen NAD   Assessment/Plan: 1. 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  which require 3+ hours per day of  interdisciplinary therapy in a comprehensive inpatient rehab setting. Physiatrist is providing close team supervision and 24 hour management of active medical problems listed below. Physiatrist and rehab team continue to assess barriers to discharge/monitor patient progress toward functional and medical goals. FIM:                                 Medical Problem List and Plan: 1. Decreased functional mobility with aphasiasecondary to multifocal intraparenchymal hemorrhages -CIR PT, OT, SLP evals 2. DVT Prophylaxis/Anticoagulation: SCDs.  3. Pain Management/trigeminal neuralgia: Resume Neurontin 100-200 mg at bedtime as needed- may need to titrate 4. Mood/Alzheimer's dementia: Provide emotional support.Zap with family 5. Neuropsych: This patient iscapable of making decisions on herown behalf. 6. Skin/Wound Care: Routine skin checks 7. Fluids/Electrolytes/Nutrition: Routine I&O's with follow-up chemistries, meal intake 25% for dinner, alb low start  prostat 8.Hypertension. Norvasc 10 mg daily, Lasix 40 mg daily  9Hyperlipidemia. Pravachol  LOS (Days) 1 A FACE TO FACE EVALUATION WAS PERFORMED  Deklin Bieler E 10/15/2016, 7:55 AM

## 2016-10-15 NOTE — Evaluation (Signed)
Speech Language Pathology Assessment and Plan  Patient Details  Name: Julia Floyd MRN: [id number] Date of Birth: 14-Jan-1931  SLP Diagnosis: N/A Rehab Potential: N/A ELOS: N/A    Today's Date: 10/15/2016 SLP Individual Time: 1300-1400 SLP Individual Time Calculation (min): 60 min   Problem List:  Patient Active Problem List   Diagnosis Date Noted  . Gait disturbance, post-stroke   . Intraparenchymal hemorrhage of brain ([facility]) 10/14/2016  . Received tissue plasminogen activator (t-PA) less than 24 hours prior to arrival 10/09/2016  . ICH (intracerebral hemorrhage) ([facility]) 10/09/2016  . Cytotoxic brain edema ([facility]) 10/09/2016  . Stroke (cerebrum) ([facility]) -  Suspect stroke/TIA s/p tPA with multifocal ICH - etiology unclear, concerning for underlying CAA 10/07/2016  . HTN (hypertension)   . Diabetes ([facility])   . Depression   . DDD (degenerative disc disease)   . Uterine fibroid   . Trigeminal neuralgia   . Sixth cranial nerve palsy    Past Medical History:  Past Medical History:  Diagnosis Date  . DDD (degenerative disc disease)   . Depression   . Diabetes ([facility])   . HTN (hypertension)   . Psychotic disorder    secondary to the below factors. 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 Bipolar disorder, most likely type 3, this episode being manic  . Sixth cranial nerve palsy   . Trigeminal neuralgia   . Uterine fibroid    Past Surgical History:  Past Surgical History:  Procedure Laterality Date  . BRONCHOSCOPY    . TRACHEOSTOMY      Assessment / Plan / Recommendation Clinical Impression Patientis an 83 y.o.right handed femalewith history of remote stroke, Alzheimer's dementia, trigeminal neuralgia, diabetes mellitus and hypertension. Per chart review patient lives with family. One level home with ramped entrance. She has a home health aide 2 hours daily to assist with ADLs. Patient performs sit to stand and stand pivot transfers modified independent  prior to admission. Presented 10/07/2016 with altered mental status and inability to speak. Cranial CT scan negative for acute changes. Patient did receive TPA. CT angiogram head and neck showed no large or medium vessel occlusion identified. Left subclavian artery with 50% stenosis. Follow-up CT showed interval development of multifocal intraparenchymal hemorrhages no significant mass effect. MRI showed no new hemorrhage is present there was some surrounding vasogenic edema with small local mass effect particularly in the left cerebellum. Echocardiogram with ejection fraction 65% grade 1 diastolic dysfunction. Neurology follow-up with close monitoring. No current anti-thrombotic due to ICH. Physical therapy evaluation completed with recommendations of physical medicine rehabilitation consult.Patient was admitted for comprehensive rehabilitation program. Patient transferred to CIR on 10/14/2016.    Patient's overall cognitive-linguistic function appears at her baseline level of functioning 2/2 history of dementia. Patient followed 3-step commands, verbally expressed her basic wants/needs and was able to complete functional and familiar tasks safely with overall supervision-Min A verbal cues. Patient also consumed her lunch meal of Dys. 3 textures with thin liquids without overt s/s of aspiration with mildly prolonged mastication that patient independently managed with appropriate bite size and pacing. Patient and family report difficulty with mastication at baseline due to dentures and that patient prefers softer foods, therefore, recommend patient continue her current diet. Skilled f/u intervention is not warranted at this time due to patient being at her baseline swallowing and cognitive-linguistic function. However, recommend patient discharge home with 24 hour supervision.     Skilled Therapeutic Interventions          Administered  a cognitive-linguistic evaluation and BSE. Please see above for details.    SLP Assessment  Patient does not need any further Speech Lanaguage Pathology Services    Recommendations  SLP Diet Recommendations: Dysphagia 3 (Mech soft);Thin Liquid Administration via: Straw Medication Administration: Whole meds with liquid Supervision: Patient able to self feed;Intermittent supervision to cue for compensatory strategies Compensations: Minimize environmental distractions;Slow rate;Small sips/bites Postural Changes and/or Swallow Maneuvers: Seated upright 90 degrees Oral Care Recommendations: Oral care BID Patient destination: Home Follow up Recommendations: 24 hour supervision/assistance;None Equipment Recommended: None recommended by SLP    SLP Frequency N/A  SLP Duration  SLP Intensity  SLP Treatment/Interventions N/A  N/A  N/A   Pain No/Denies Pain   Prior Functioning Type of Home: House  Lives With: Daughter Available Help at Discharge: Family;Available 24 hours/day  Function:  Eating Eating   Modified Consistency Diet: Yes Eating Assist Level: Swallowing techniques: self managed;Set up assist for   Eating Set Up Assist For: Opening containers       Cognition Comprehension Comprehension assist level: Understands basic 90% of the time/cues < 10% of the time  Expression   Expression assist level: Expresses basic needs/ideas: With extra time/assistive device  Social Interaction Social Interaction assist level: Interacts appropriately 90% of the time - Needs monitoring or encouragement for participation or interaction.  Problem Solving Problem solving assist level: Solves basic problems with no assist  Memory Memory assist level: Recognizes or recalls 75 - 89% of the time/requires cueing 10 - 24% of the time   Short Term Goals: N/A   Refer to Care Plan for Long Term Goals  Recommendations for other services: None   Discharge Criteria: Patient will be discharged from SLP if patient refuses treatment 3 consecutive times without medical  reason, if treatment goals not met, if there is a change in medical status, if patient makes no progress towards goals or if patient is discharged from hospital.  The above assessment, treatment plan, treatment alternatives and goals were discussed and mutually agreed upon: by patient and by family  Saroya Riccobono 10/15/2016, 3:27 PM

## 2016-10-15 NOTE — Progress Notes (Signed)
Pt admission to inpatient rehab, arrived on unit via bed without complaint and vss, orient to rehab unit and schedule, denies pain,  fall risk reviewed, call light in reach, pt verb understanding.

## 2016-10-15 NOTE — H&P (Signed)
Physical Medicine and Rehabilitation Admission H&P       Chief Complaint  Patient presents with  . Aphasia  : HPI: Julia A Lattimoreis a 83 y.o.right handed femalewith history of remote stroke, Alzheimer's dementia, trigeminal neuralgia, diabetes mellitus and hypertension. Per chart review patient lives with family. One level home with ramped entrance. She has a home health aide 2 hours daily to assist with ADLs. Patient performs sit to stand and stand pivot transfers modified independent prior to admission. Presented 10/07/2016 with altered mental status and inability to speak. Cranial CT scan negative for acute changes. Patient did receive TPA. CT angiogram head and neck showed no large or medium vessel occlusion identified. Left subclavian artery with 50% stenosis. Follow-up CTI showed interval development of multifocal intraparenchymal hemorrhages no significant mass effect. MRI showed no new hemorrhage is present there was some surrounding vasogenic edema with small local mass effect particularly in the left cerebellum. Echocardiogram with ejection fraction 35% grade 1 diastolic dysfunction. Neurology follow-up with close monitoring. No current anti-thrombotic due to [place]. Dysphagia #3 thin liquid diet. Physical therapy evaluation completed with recommendations of physical medicine rehabilitation consult.Patient was admitted for comprehensive rehabilitation program.  Review of Systems  Constitutional: Negative for chills and fever.  HENT: Negative for hearing loss.   Eyes: Negative for blurred vision and double vision.  Respiratory: Positive for cough and shortness of breath.   Cardiovascular: Positive for orthopnea and leg swelling. Negative for chest pain.  Gastrointestinal: Positive for constipation. Negative for blood in stool, nausea and vomiting.  Genitourinary: Positive for urgency.  Musculoskeletal: Positive for back pain and myalgias.  Skin: Negative for rash.    Neurological: Negative for seizures.  Psychiatric/Behavioral: Positive for depression and memory loss.  All other systems reviewed and are negative.      Past Medical History:  Diagnosis Date  . DDD (degenerative disc disease)   . Depression   . Diabetes ([place])   . HTN (hypertension)   . Psychotic disorder    secondary to the below factors. 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 Bipolar disorder, most likely type 3, this episode being manic  . Sixth cranial nerve palsy   . Trigeminal neuralgia   . Uterine fibroid         Past Surgical History:  Procedure Laterality Date  . BRONCHOSCOPY    . TRACHEOSTOMY          Family History  Problem Relation Age of Onset  . Heart attack Unknown   . Diabetes Unknown    Social History:  reports that she has quit smoking. She has never used smokeless tobacco. She reports that she does not drink alcohol or use drugs. Allergies: No Known Allergies       Medications Prior to Admission  Medication Sig Dispense Refill  . amLODipine (NORVASC) 5 MG tablet Take 1 tablet (5 mg total) by mouth daily. 5 tablet 0  . aspirin 325 MG tablet Take 325 mg by mouth daily.    . cyanocobalamin 500 MCG tablet Take 500 mcg by mouth. Vitamin B12    . furosemide (LASIX) 40 MG tablet Take 1 tablet (40 mg total) by mouth daily. 5 tablet 0  . gabapentin (NEURONTIN) 100 MG capsule Take 100-200 mg by mouth at bedtime.    . metFORMIN (GLUCOPHAGE) 500 MG tablet Take 500-1,000 mg by mouth 2 (two) times daily with a meal.       Home: Home Living Family/patient expects to be discharged to::  Private residence Living Arrangements: Children Available Help at Discharge: Family, Available 24 hours/day Type of Home: House Home Access: [place]: One level Bathroom Shower/Tub: Tub/shower unit, Other (comment) (Pt taking sponge baths) Bathroom Toilet: Standard Home Equipment: Bedside commode, Tub bench, Grab  bars - toilet, [facility] bed Additional Comments: Pt has an aide that comes every morning for 2 hours to assist with ADL  Lives With: Daughter   Functional History: Prior Function Level of Independence: Needs assistance Gait / Transfers Assistance Needed: per family, pt performed sit<>stand and stand-pivot transfers with mod I ADL's / Homemaking Assistance Needed: assist with ADLs from aide that comes for 2 hours every morning and daughters, feeds self  Functional Status:  Mobility: Bed Mobility Overal bed mobility: Needs Assistance Bed Mobility: Sit to Supine Supine to sit: Mod assist, HOB elevated Sit to supine: Max assist General bed mobility comments: max A for assist with BLE back into bed, and then +2 for scooting in bed Transfers Overall transfer level: Needs assistance Equipment used: 2 person hand held assist (Face to face with gait belt and chuck pad) Transfer via Lift Equipment: Stedy Transfers: Sit to/from Stand Sit to Stand: +2 safety/equipment, Max assist Stand pivot transfers: Max assist, +2 physical assistance General transfer comment: 2 person face to face with gait belt and chuck pad, performed si <> stand x3 prior to SPT to chair. Patient with some increased FOF and anxiety during transfers Ambulation/Gait General Gait Details: pt uses a w/c for mobility at baseline  ADL: ADL Overall ADL's : Needs assistance/impaired Eating/Feeding: Set up, Sitting Grooming: Minimal assistance, Sitting Upper Body Bathing: Moderate assistance Lower Body Bathing: Moderate assistance Upper Body Dressing : Minimal assistance Lower Body Dressing: Maximal assistance Toilet Transfer: +2 for physical assistance, +2 for safety/equipment, Maximal assistance (STEDY used for Pt and therapist safety, NT assisting) Toilet Transfer Details (indicate cue type and reason): simulated through transfer to bed Toileting- Clothing Manipulation and Hygiene:  (Pt currently  using external female catheter) Toileting - Clothing Manipulation Details (indicate cue type and reason): Pt currently using external catheter Tub/ Shower Transfer: Maximal assistance, +2 for physical assistance, Stand-pivot, Tub bench General ADL Comments: Pt with siginificant decreased independence in transfers necessary for ADL  Cognition: Cognition Overall Cognitive Status: History of cognitive impairments - at baseline Orientation Level: Oriented to person, Oriented to place, Oriented to situation Cognition Arousal/Alertness: Awake/alert Behavior During Therapy: WFL for tasks assessed/performed Overall Cognitive Status: History of cognitive impairments - at baseline General Comments: pt with Alzheimer's dementia at baseline  Physical Exam: Blood pressure (!) 131/52, pulse 80, temperature 98.2 F (36.8 C), temperature source Oral, resp. rate 18, height _0  (1.676 m), weight 71.5 kg (157 lb 10.1 oz), SpO2 100 %. Physical Exam  Constitutional: She appears well-developed. No distress.  HENT:  Head: Normocephalic and atraumatic.  Eyes: Pupils are equal, round, and reactive to light. EOM are normal.  Neck: Normal range of motion. Neck supple. No tracheal deviation present. No thyromegaly present.  Cardiovascular: Normal rate, regular rhythm and normal heart sounds.  Exam reveals no friction rub.   No murmur heard. Respiratory: Effort normal and breath sounds normal. No respiratory distress. She has no wheezes. She has no rales.  GI: Soft. Bowel sounds are normal. She exhibits no distension. There is no tenderness. There is no rebound.  Psychiatric: She has a normal mood and affect. Her behavior is normal. Thought content normal.  Skin. Warm and dry Neurological: She is alert.  Makes good eye contact with examiner. Follows simple commands. Provides her name and age. Fair awareness of deficits. RUE and RLE 4 to 4+/5. LUE 4/5 prox to distal. Fair coordination. LLE: 2- to 2/5 HF, 2+KE  and 3/5 ADF/PF. Oriented to person, place month/year. Fair insight and awareness. Senses pain and light touch on all 4's   Lab Results Last 48 Hours        Results for orders placed or performed during the hospital encounter of 10/07/16 (from the past 48 hour(s))  CBC     Status: Abnormal   Collection Time: 10/12/16  4:03 AM  Result Value Ref Range   WBC 5.5 4.0 - 10.5 K/uL   RBC 2.82 (L) 3.87 - 5.11 MIL/uL   Hemoglobin 8.2 (L) 12.0 - 15.0 g/dL   HCT 26.8 (L) 36.0 - 46.0 %   MCV 95.0 78.0 - 100.0 fL   MCH 29.1 26.0 - 34.0 pg   MCHC 30.6 30.0 - 36.0 g/dL   RDW 16.3 (H) 11.5 - 15.5 %   Platelets 127 (L) 150 - 400 K/uL  Basic metabolic panel     Status: Abnormal   Collection Time: 10/12/16  4:03 AM  Result Value Ref Range   Sodium 139 135 - 145 mmol/L   Potassium 3.4 (L) 3.5 - 5.1 mmol/L   Chloride 109 101 - 111 mmol/L   CO2 26 22 - 32 mmol/L   Glucose, Bld 101 (H) 65 - 99 mg/dL   BUN 5 (L) 6 - 20 mg/dL   Creatinine, Ser 0.72 0.44 - 1.00 mg/dL   Calcium 8.5 (L) 8.9 - 10.3 mg/dL   GFR calc non Af Amer >60 >60 mL/min   GFR calc Af Amer >60 >60 mL/min    Comment: (NOTE) The eGFR has been calculated using the CKD EPI equation. This calculation has not been validated in all clinical situations. eGFR's persistently <60 mL/min signify possible Chronic Kidney Disease.    Anion gap 4 (L) 5 - 15  CBC     Status: Abnormal   Collection Time: 10/13/16  4:12 AM  Result Value Ref Range   WBC 5.3 4.0 - 10.5 K/uL   RBC 2.90 (L) 3.87 - 5.11 MIL/uL   Hemoglobin 8.5 (L) 12.0 - 15.0 g/dL   HCT 27.5 (L) 36.0 - 46.0 %   MCV 94.8 78.0 - 100.0 fL   MCH 29.3 26.0 - 34.0 pg   MCHC 30.9 30.0 - 36.0 g/dL   RDW 16.4 (H) 11.5 - 15.5 %   Platelets 122 (L) 150 - 400 K/uL  Basic metabolic panel     Status: Abnormal   Collection Time: 10/13/16  4:12 AM  Result Value Ref Range   Sodium 139 135 - 145 mmol/L   Potassium 3.4 (L) 3.5 - 5.1 mmol/L   Chloride 107  101 - 111 mmol/L   CO2 28 22 - 32 mmol/L   Glucose, Bld 108 (H) 65 - 99 mg/dL   BUN 5 (L) 6 - 20 mg/dL   Creatinine, Ser 0.61 0.44 - 1.00 mg/dL   Calcium 8.5 (L) 8.9 - 10.3 mg/dL   GFR calc non Af Amer >60 >60 mL/min   GFR calc Af Amer >60 >60 mL/min    Comment: (NOTE) The eGFR has been calculated using the CKD EPI equation. This calculation has not been validated in all clinical situations. eGFR's persistently <60 mL/min signify possible Chronic Kidney Disease.    Anion gap 4 (L) 5 - 15  Imaging Results (Last 48 hours)  Mr Jeri Cos Wo Contrast  Result Date: 10/12/2016 CLINICAL DATA:  Multiple subcortical brainstem hemorrhages following tPA. Acute infarct 5 days ago. EXAM: MRI HEAD WITHOUT AND WITH CONTRAST MRV HEAD WITHOUT CONTRAST TECHNIQUE: Multiplanar, multiecho pulse sequences of the brain and surrounding structures were obtained without and with intravenous contrast. Angiographic images of the head were obtained using MRV technique without contrast. CONTRAST:  <See Chart> MULTIHANCE GADOBENATE DIMEGLUMINE 529 MG/ML IV SOLN COMPARISON:  CT head without contrast 10/09/2016 and 10/08/2016. FINDINGS: MRI HEAD FINDINGS Brain: Multiple subcortical hemorrhages are again noted bilaterally. Prominent areas involving the right temporal tip, left cerebellum, posterior inferior left temporal lobe and left frontal operculum are stable. No new areas of hemorrhage are present. There is no significant progression of these hemorrhages. Extensive surrounding vasogenic edema is associated. Local mass effect is present without midline shift or herniation. The postcontrast images demonstrate intrinsic T1 shortening of the hemorrhagic areas without associated enhancement. A meningioma along the anterior inferior falx measures 8 x 8 x 5 mm. No other pathologic enhancement is present. No areas of acute nonhemorrhagic infarct are present. The ventricles are proportionate to the degree of atrophy.  No significant extra-axial fluid collection is present. Vascular: Flow is present in the major intracranial arteries. Skull and upper cervical spine: The skullbase is within normal limits. The craniocervical junction is normal. The upper cervical spine is within normal limits. Marrow signal is normal. Sinuses/Orbits: The paranasal sinuses and mastoid air cells are clear. The globes and orbits are within normal limits. MRV HEAD FINDINGS The dural sinuses demonstrate normal flow. The transverse sinuses are codominant. The straight sinus and deep cerebral veins are intact. Cortical veins are unremarkable. IMPRESSION: 1. Stable appearance of multiple scattered subcortical hemorrhages as previously described. 2. Surrounding vasogenic edema with some local mass effect, particularly in the left cerebellum. There is no midline shift or herniation. 3. No new hemorrhages are present. 4. No pathologic enhancement. 5. Normal MR venogram of the head. Electronically Signed   By: San Morelle M.D.   On: 10/12/2016 17:54   Mr Mrv Head Wo Cm  Result Date: 10/12/2016 CLINICAL DATA:  Multiple subcortical brainstem hemorrhages following tPA. Acute infarct 5 days ago. EXAM: MRI HEAD WITHOUT AND WITH CONTRAST MRV HEAD WITHOUT CONTRAST TECHNIQUE: Multiplanar, multiecho pulse sequences of the brain and surrounding structures were obtained without and with intravenous contrast. Angiographic images of the head were obtained using MRV technique without contrast. CONTRAST:  <See Chart> MULTIHANCE GADOBENATE DIMEGLUMINE 529 MG/ML IV SOLN COMPARISON:  CT head without contrast 10/09/2016 and 10/08/2016. FINDINGS: MRI HEAD FINDINGS Brain: Multiple subcortical hemorrhages are again noted bilaterally. Prominent areas involving the right temporal tip, left cerebellum, posterior inferior left temporal lobe and left frontal operculum are stable. No new areas of hemorrhage are present. There is no significant progression of these  hemorrhages. Extensive surrounding vasogenic edema is associated. Local mass effect is present without midline shift or herniation. The postcontrast images demonstrate intrinsic T1 shortening of the hemorrhagic areas without associated enhancement. A meningioma along the anterior inferior falx measures 8 x 8 x 5 mm. No other pathologic enhancement is present. No areas of acute nonhemorrhagic infarct are present. The ventricles are proportionate to the degree of atrophy. No significant extra-axial fluid collection is present. Vascular: Flow is present in the major intracranial arteries. Skull and upper cervical spine: The skullbase is within normal limits. The craniocervical junction is normal. The upper cervical spine is within normal limits. Marrow  signal is normal. Sinuses/Orbits: The paranasal sinuses and mastoid air cells are clear. The globes and orbits are within normal limits. MRV HEAD FINDINGS The dural sinuses demonstrate normal flow. The transverse sinuses are codominant. The straight sinus and deep cerebral veins are intact. Cortical veins are unremarkable. IMPRESSION: 1. Stable appearance of multiple scattered subcortical hemorrhages as previously described. 2. Surrounding vasogenic edema with some local mass effect, particularly in the left cerebellum. There is no midline shift or herniation. 3. No new hemorrhages are present. 4. No pathologic enhancement. 5. Normal MR venogram of the head. Electronically Signed   By: San Morelle M.D.   On: 10/12/2016 17:54        Medical Problem List and Plan: 1.  Decreased functional mobility with aphasia secondary to multifocal intraparenchymal hemorrhages             -admit to inpatient rehab 2.  DVT Prophylaxis/Anticoagulation: SCDs.  3. Pain Management/trigeminal neuralgia: Resume Neurontin 100-200 mg at bedtime as needed 4. Mood/Alzheimer's dementia: Provide emotional support. Discuss baseline with family 5. Neuropsych: This patient is  capable of making decisions on her own behalf. 6. Skin/Wound Care: Routine skin checks 7. Fluids/Electrolytes/Nutrition: Routine I&O's with follow-up chemistries 8. Hypertension. Norvasc 10 mg daily, Lasix 40 mg daily 9. Diabetes mellitus. Hemoglobin A1c 5.1. CBGs discontinued 10. Hyperlipidemia. Pravachol   Post Admission Physician Evaluation: 1. Functional deficits secondary  to multifocal intraparenchymal hemorrhages. 2. Patient is admitted to receive collaborative, interdisciplinary care between the physiatrist, rehab nursing staff, and therapy team. 3. Patient's level of medical complexity and substantial therapy needs in context of that medical necessity cannot be provided at a lesser intensity of care such as a SNF. 4. Patient has experienced substantial functional loss from his/her baseline which was documented above under the "Functional History" and "Functional Status" headings.  Judging by the patient's diagnosis, physical exam, and functional history, the patient has potential for functional progress which will result in measurable gains while on inpatient rehab.  These gains will be of substantial and practical use upon discharge  in facilitating mobility and self-care at the household level. 5. Physiatrist will provide 24 hour management of medical needs as well as oversight of the therapy plan/treatment and provide guidance as appropriate regarding the interaction of the two. 6. The Preadmission Screening has been reviewed and patient status is unchanged unless otherwise stated above. 7. 24 hour rehab nursing will assist with bladder management, bowel management, safety, skin/wound care, disease management, medication administration, pain management and patient education  and help integrate therapy concepts, techniques,education, etc. 8. PT will assess and treat for/with: Lower extremity strength, range of motion, stamina, balance, functional mobility, safety, adaptive techniques and  equipment, NMR, family ed, ego supprot.   Goals are: supervision to mod I goals. 9. OT will assess and treat for/with: ADL's, functional mobility, safety, upper extremity strength, adaptive techniques and equipment, NMR, family ed, community reentry.   Goals are: supervision to mod I. Therapy may proceed with showering this patient. 10. SLP will assess and treat for/with: cognition, communication.  Goals are: supervision to mod I. 11. Case Management and Social Worker will assess and treat for psychological issues and discharge planning. 12. Team conference will be held weekly to assess progress toward goals and to determine barriers to discharge. 13. Patient will receive at least 3 hours of therapy per day at least 5 days per week. 14. ELOS: 14-18 days       15. Prognosis:  excellent  [name], MD, [facility] 10/14/2016  Cathlyn Parsons., PA-C 10/13/2016

## 2016-10-15 NOTE — Progress Notes (Signed)
Social Work Assessment and Plan Social Work Assessment and Plan  Patient Details  Name: Julia Floyd MRN: [id number] Date of Birth: 1930-07-15  Today's Date: 10/15/2016  Problem List:  Patient Active Problem List   Diagnosis Date Noted  . Gait disturbance, post-stroke   . Intraparenchymal hemorrhage of brain ([place]) 10/14/2016  . Received tissue plasminogen activator (t-PA) less than 24 hours prior to arrival 10/09/2016  . ICH (intracerebral hemorrhage) ([place]) 10/09/2016  . Cytotoxic brain edema ([place]) 10/09/2016  . Stroke (cerebrum) ([place]) -  Suspect stroke/TIA s/p tPA with multifocal ICH - etiology unclear, concerning for underlying CAA 10/07/2016  . HTN (hypertension)   . Diabetes ([place])   . Depression   . DDD (degenerative disc disease)   . Uterine fibroid   . Trigeminal neuralgia   . Sixth cranial nerve palsy    Past Medical History:  Past Medical History:  Diagnosis Date  . DDD (degenerative disc disease)   . Depression   . Diabetes ([place])   . HTN (hypertension)   . Psychotic disorder    secondary to the below factors. 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 Bipolar disorder, most likely type 3, this episode being manic  . Sixth cranial nerve palsy   . Trigeminal neuralgia   . Uterine fibroid    Past Surgical History:  Past Surgical History:  Procedure Laterality Date  . BRONCHOSCOPY    . TRACHEOSTOMY     Social History:  reports that she has quit smoking. She has never used smokeless tobacco. She reports that she does not drink alcohol or use drugs.  Family / Support Systems Marital Status: Widow/Widower Patient Roles: Parent, Other (Comment) (grandparent) Children: Paulette-daughter Other Supports: Tanisha-grandaughter [phone]  Tony-grandson [phone] Anticipated Caregiver: Database administrator Ability/Limitations of Caregiver: Daughter has some health issues of her own and can not provide physical care Caregiver Availability:  Other (Comment) (Family will provide 24 hr care if needed) Family Dynamics: Close knit family who assists each other with their needs. Pt wants to be able to do all she can for herself before she leaves the rehab unit. Family has been providing care when needed to pt prior to admission  Social History Preferred language: English Religion: Holiness Cultural Background: No issues Education: High School Read: Yes Write: Yes Employment Status: Retired Freight forwarder Issues: No issues Guardian/Conservator: None-according to MD pt is capable of making her own decisions while here. Will make sure granddaughter is involved per pt request   Abuse/Neglect Physical Abuse: Denies Verbal Abuse: Denies Sexual Abuse: Denies Exploitation of patient/patient's resources: Denies Self-Neglect: Denies  Emotional Status Pt's affect, behavior adn adjustment status: Pt is motivated to do well and has made progress since coming in, which is encouraging to pt. Pt had some help at home prior to admission-assist with ADL's with aide. Her family is committed to providing the care she needs at home. Recent Psychosocial Issues: other health issues Pyschiatric History: History of psychotic disorder listed in chart-pt has voiced she is not sure what this is besides her dementia. May benefit from seeing neuro-psych while here, will ask for input from team Substance Abuse History: No issues  Patient / Family Perceptions, Expectations & Goals Pt/Family understanding of illness & functional limitations: Pt talks with MD and family has spoken with MD regarding pt's condition and the plan. All feel they have a basic understanding of her plans and are grateful for any progress pt makes while here. Premorbid pt/family roles/activities: Mom, grandmother, church member,  retiree, etc Anticipated changes in roles/activities/participation: resume Pt/family expectations/goals: Pt states: " I want to do as well as I can  here, atleast get back to my prior function."  Grandaughter states: " I hope she makes progress and does well here."  [employer]: Other (Comment) (in past) Premorbid Home Care/DME Agencies: Other (Comment) (has [facility]-2 hrs per day for ADL's) Transportation available at discharge: Family Resource referrals recommended: Neuropsychology, Support group (specify)  Discharge Planning Living Arrangements: Children, Other relatives Support Systems: Children, Other relatives, Friends/neighbors Type of Residence: Private residence Insurance Resources: Multimedia programmer (specify) Primary school teacher) Financial Resources: Fish farm manager, Family Support Financial Screen Referred: No Living Expenses: Lives with family Money Management: Family Does the patient have any problems obtaining your medications?: No Home Management: family does Patient/Family Preliminary Plans: Return home with daughter and granddaughter who will assist her. She has an aide for 2 hrs per day to help with ADL's and will resume this upon discharge. Will await team and their goals to work on discharge plans. Family is very committed to taking pt home. Social Work Anticipated Follow Up Needs: HH/OP, Support Group  Clinical Impression Pleasant female who is encouraged by her progress and ability to speak this is what she lost when coming into the hospital. She feels relieved she can communicate now. Her family is very committed to providing care whatever Level she is. Will await therapy team evaluations and work on a safe discharge plan.  [name] 10/15/2016, 1:54 PM

## 2016-10-15 NOTE — Patient Care Conference (Signed)
Inpatient RehabilitationTeam Conference and Plan of Care Update Date: 10/15/2016   Time: 10:55 AM    Patient Name: Julia Floyd      Medical Record Number: [id number]  Date of Birth: Dec 16, 1930 Sex: Female         Room/Bed: 4W03C/4W03C-01 Payor Info: Payor: [employer] park [employer] MEDICARE / Plan: UHC MEDICARE / Product Type: *No Product type* /    Admitting Diagnosis: CVA  Admit Date/Time:  10/14/2016  6:50 PM Admission Comments: No comment available   Primary Diagnosis:  <principal problem not specified> Principal Problem: <principal problem not specified>  Patient Active Problem List   Diagnosis Date Noted  . Gait disturbance, post-stroke   . Intraparenchymal hemorrhage of brain ([place]) 10/14/2016  . Received tissue plasminogen activator (t-PA) less than 24 hours prior to arrival 10/09/2016  . ICH (intracerebral hemorrhage) ([place]) 10/09/2016  . Cytotoxic brain edema ([place]) 10/09/2016  . Stroke (cerebrum) ([place]) -  Suspect stroke/TIA s/p tPA with multifocal ICH - etiology unclear, concerning for underlying CAA 10/07/2016  . HTN (hypertension)   . Diabetes ([place])   . Depression   . DDD (degenerative disc disease)   . Uterine fibroid   . Trigeminal neuralgia   . Sixth cranial nerve palsy     Expected Discharge Date: Expected Discharge Date: 10/14/16  Team Members Present: Physician leading conference: Dr. Alysia Penna Social Worker Present: Ovidio Kin, LCSW Nurse Present: Rayetta Humphrey, RN PT Present: Barrie Folk, PT;Rosita Dechalus, PTA OT Present: Napoleon Form, OT SLP Present: Weston Anna, SLP PPS Coordinator present : Daiva Nakayama, RN, CRRN     Current Status/Progress Goal Weekly Team Focus  Medical     evaluating and managing her care   medical stability     Bowel/Bladder        Cont B & B     Swallow/Nutrition/ Hydration   Eval Pending         ADL's     eval pending        Mobility     eval pending        Communication   Eval Pending          Safety/Cognition/ Behavioral Observations  Eval Pending          Pain        manage pain no issues     Skin        no skin issues        *See Care Plan and progress notes for long and short-term goals.     Barriers to Discharge  Current Status/Progress Possible Resolutions Date Resolved   Physician                    Nursing                  PT  Decreased caregiver support;Inaccessible home environment;Home environment access/layout                 OT                  SLP                SW                Discharge Planning/Teaching Needs:    Home with daughter and grandchildren assisting. Would like to get back to her baseline function before returning home     Team Discussion:  Goals-supervision level-wheelchair, used power chair at home prior to admission.  Evaluations pending  Revisions to Treatment Plan:  New eval       [name] 10/15/2016, 12:28 PM

## 2016-10-15 NOTE — Progress Notes (Signed)
Gunnar Fusi Rehab Admission Coordinator Signed Physical Medicine and Rehabilitation  PMR Pre-admission Date of Service: 10/14/2016 10:44 AM  Related encounter: ED to Hosp-Admission (Discharged) from 10/07/2016 in [place]       [] Hide copied text PMR Admission Coordinator Pre-Admission Assessment  Patient: Julia Floyd is an 83 y.o., female MRN: [id number] DOB: [birth year] Height: 5\' 6"  (167.6 cm) Weight: 71.5 kg (157 lb 10.1 oz)                                                                                                                                                  Insurance Information HMO: X    PPO:      PCP:      IPA:      80/20:      OTHER:  PRIMARY: UHC Medicare       Policy#: [id number]      Subscriber:  CM Name: Vevelyn Royals      Phone#: [phone]     Fax#: [phone] Pre-Cert#: [id number] given by Orvan July     Employer: Retired  Benefits:  Phone #: Verified online     Name:  Irene Shipper. Date: 09/28/16     Deduct: $0      Out of Pocket Max: [phone]      Life Max: N/A CIR: $430 a day, days 1-4; $0 a day, days 5+      SNF: $0 a day, days 1-20; $160 a day, days 21-62; $0 a day, days 63-100 Outpatient: [facility]      Co-Pay: $40 per visit  Home Health: 100%      Co-Pay: None DME: 80%     Co-Pay: 20%  Providers: In-network   Medicaid Application Date:       Case Manager:  Disability Application Date:       Case Worker:   Emergency Contact Information        Contact Information    Name Relation Home Work Mobile   Richardson,Tanisha Grandaughter   [phone]   Akeela, Busk   [phone]   [place] [phone]       Current Medical History  Patient Admitting Diagnosis: Multifocal intraparenchymal hemorrhages  History of Present Illness: Julia A Lattimoreis an 83 y.o.right handed femalewith history of remote stroke, Alzheimer's dementia, trigeminal neuralgia, diabetes mellitus  and hypertension. Per chart review patient lives with her daughter who helped intermittently prior to admission. One level home with ramped entrance due to patient being wheelchair level prior to admission. She has a home health aide 2 hours daily to assist with ADLs. Patient performs sit to stand and stand pivot transfers modified independent prior to admission. Presented 10/07/2016 with altered mental status and inability to speak. Cranial CT scan negative for acute changes. Patient did receive TPA. CT angiogram head and neck showed no large  or medium vessel occlusion identified. Left subclavian artery with 50% stenosis. Follow-up CTI showed interval development of multifocal intraparenchymal hemorrhages no significant mass effect. MRI showed no new hemorrhage is present there was some surrounding vasogenic edema with small local mass effect particularly in the left cerebellum. Echocardiogram with ejection fraction 63% grade 1 diastolic dysfunction. Neurology follow-up with close monitoring. No current anti-thrombotic due to [place]. Dysphagia 3 textures and thin liquids currently recommended.  Physical therapy evaluation completed with recommendations of physical medicine rehabilitation consult. Patient was admitted for comprehensive rehabilitation program 10/11/16.  NIH Total: 6  Past Medical History      Past Medical History:  Diagnosis Date  . DDD (degenerative disc disease)   . Depression   . Diabetes ([place])   . HTN (hypertension)   . Psychotic disorder    secondary to the below factors. 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 Bipolar disorder, most likely type 3, this episode being manic  . Sixth cranial nerve palsy   . Trigeminal neuralgia   . Uterine fibroid     Family History  family history includes Diabetes in her unknown relative; Heart attack in her unknown relative.  Prior Rehab/Hospitalizations:  Has the patient had major surgery during 100 days prior  to admission? No  Current Medications   Current Facility-Administered Medications:  .  0.9 %  sodium chloride infusion, , Intravenous, Continuous, [name], MD, Last Rate: 40 mL/hr at 10/11/16 2222 .  acetaminophen (TYLENOL) tablet 650 mg, 650 mg, Oral, Q4H PRN **OR** acetaminophen (TYLENOL) solution 650 mg, 650 mg, Per Tube, Q4H PRN **OR** acetaminophen (TYLENOL) suppository 650 mg, 650 mg, Rectal, Q4H PRN, Greta Doom, MD .  amLODipine (NORVASC) tablet 10 mg, 10 mg, Oral, Daily, [name], MD, 10 mg at 10/14/16 1030 .  furosemide (LASIX) tablet 40 mg, 40 mg, Oral, Daily, Patteson, Samuel A, NP, 40 mg at 10/14/16 1030 .  labetalol (NORMODYNE,TRANDATE) injection 20 mg, 20 mg, Intravenous, Q4H PRN, Patteson, Samuel A, NP .  pantoprazole (PROTONIX) EC tablet 40 mg, 40 mg, Oral, Daily, [name], MD, 40 mg at 10/14/16 1031 .  pravastatin (PRAVACHOL) tablet 40 mg, 40 mg, Oral, q1800, [name], MD, 40 mg at 10/13/16 1843  Patients Current Diet: DIET DYS 3 Room service appropriate? Yes; Fluid consistency: Thin  Precautions / Restrictions Precautions Precautions: Fall Restrictions Weight Bearing Restrictions: No   Has the patient had 2 or more falls or a fall with injury in the past year?No  Prior Activity Level Household: Patient mostly home and reports going out for medical appointments.    Home Assistive Devices / Equipment Home Assistive Devices/Equipment: Wheelchair Home Equipment: Bedside commode, Tub bench, Grab bars - toilet, Wheelchair - power, Hospital bed  Prior Device Use: Indicate devices/aids used by the patient prior to current illness, exacerbation or injury? Manual wheelchair  Prior Functional Level Prior Function Level of Independence: Needs assistance Gait / Transfers Assistance Needed: per family, pt performed sit<>stand and stand-pivot transfers with mod I ADL's / Homemaking Assistance Needed: assist with ADLs from aide that comes for 2  hours every morning and daughters, feeds self  Self Care: Did the patient need help bathing, dressing, using the toilet or eating? Needed some help  Indoor Mobility: Did the patient need assistance with walking from room to room (with or without device)? Needed some help  Stairs: Did the patient need assistance with internal or external stairs (with or without device)? Dependent  Functional Cognition: Did the patient  need help planning regular tasks such as shopping or remembering to take medications? Needed some help  Current Functional Level Cognition  Overall Cognitive Status: History of cognitive impairments - at baseline Orientation Level: Oriented to person, Oriented to place, Oriented to situation General Comments: pt with Alzheimer's dementia at baseline    Extremity Assessment (includes Sensation/Coordination)  Upper Extremity Assessment: Defer to OT evaluation  Lower Extremity Assessment: Generalized weakness    ADLs  Overall ADL's : Needs assistance/impaired Eating/Feeding: Set up, Sitting Grooming: Minimal assistance, Sitting Upper Body Bathing: Moderate assistance Lower Body Bathing: Moderate assistance Upper Body Dressing : Minimal assistance Lower Body Dressing: Maximal assistance Toilet Transfer: +2 for physical assistance, +2 for safety/equipment, Maximal assistance (STEDY used for Pt and therapist safety, NT assisting) Toilet Transfer Details (indicate cue type and reason): simulated through transfer to bed Toileting- Clothing Manipulation and Hygiene:  (Pt currently using external female catheter) Toileting - Clothing Manipulation Details (indicate cue type and reason): Pt currently using external catheter Tub/ Shower Transfer: Maximal assistance, +2 for physical assistance, Stand-pivot, Tub bench General ADL Comments: Pt with siginificant decreased independence in transfers necessary for ADL    Mobility  Overal bed mobility: Needs Assistance Bed  Mobility: Supine to Sit Supine to sit: Mod assist, HOB elevated Sit to supine: Max assist General bed mobility comments: patient showing good movement of LEs to EOB, good sequencing of positioning and use of UEs to     Transfers  Overall transfer level: Needs assistance Equipment used: 2 person hand held assist (Face to face with gait belt and chuck pad) Transfer via Lift Equipment: Stedy Transfers: Sit to/from Stand Sit to Stand: +2 safety/equipment, Max assist Stand pivot transfers: Mod assist, +2 physical assistance General transfer comment: 2 person face to face with gait belt and chuck pad, performed si <> stand x3 prior to SPT to chair. Patient with improved ability to initiate shuffly steps    Ambulation / Gait / Stairs / Wheelchair Mobility  Ambulation/Gait General Gait Details: pt uses a w/c for mobility at baseline    Posture / Balance Dynamic Sitting Balance Sitting balance - Comments: trunk control activities performed during session, improvements noted Balance Overall balance assessment: Needs assistance Sitting-balance support: Bilateral upper extremity supported, Feet supported Sitting balance-Leahy Scale: Fair Sitting balance - Comments: trunk control activities performed during session, improvements noted Standing balance support: Bilateral upper extremity supported Standing balance-Leahy Scale: Poor Standing balance comment: reliant on support from staff/STEDY    Special needs/care consideration BiPAP/CPAP: No CPM: No Continuous Drip IV: No Dialysis: No         Life Vest: No Oxygen: No Special Bed: no Trach Size: No Wound Vac (area): No       Skin: Dry, WDL                               Bowel mgmt: 10/13/16, incontinent  Bladder mgmt: external foley, incontinent with use of depends PTA Diabetic mgmt: HgbA1c 5.1, goal < 7.0 Yes, managed with diet and oral medication PTA     Previous Home Environment Living Arrangements: Children  Lives With:  Daughter Available Help at Discharge: Family, Available 24 hours/day Type of Home: House Home Layout: One level Home Access: Ramped entrance Bathroom Shower/Tub: Tub/shower unit, Other (comment) (Pt taking sponge baths) Bathroom Toilet: Standard Home Care Services: No Additional Comments: Pt has an aide that comes every morning for 2 hours to assist with ADL  Discharge Living Setting Plans for Discharge Living Setting: Patient's home Type of Home at Discharge: House Discharge Home Layout: One level, Able to live on main level with bedroom/bathroom (with a basement ) Discharge Home Access: [place] entrance Discharge Bathroom Shower/Tub: Tub/shower unit (patient sink bathing PTA) Discharge Bathroom Toilet:  (patient used BSC PTA) Discharge Bathroom Accessibility: Yes How Accessible: Accessible via wheelchair (recently renovated ) Does the patient have any problems obtaining your medications?: No  Social/Family/Support Systems Patient Roles: Parent, Other (Comment) (Grandparent ) Contact Information: Alexis Frock, granddaughter  Anticipated Caregiver: Yvetta Coder, Daughter Paulette Anticipated Caregiver's Contact Information: [phone] Ability/Limitations of Caregiver: Yvetta Coder works, but will coordinate care with aide and daughter Jeanne Ivan, who lives with patient and was able to assist intermittently PTA. Patient also with aide 2 hours a day to assist with bathing and dressing. Yvetta Coder and Paulette aware of likely need for 24/7 supervision recommendation upon discharge from CIR.  Caregiver Availability: 24/7 Discharge Plan Discussed with Primary Caregiver: Yes Is Caregiver In Agreement with Plan?: Yes Does Caregiver/Family have Issues with Lodging/Transportation while Pt is in Rehab?: No  Goals/Additional Needs Patient/Family Goal for Rehab: PT/OT ModI I-Supervision; SLP Mod I Expected length of stay: 14-18 days  Cultural Considerations: None Dietary Needs: Dys.3 textures and  thin liquids  Equipment Needs: TBD Special Service Needs: None Additional Information: Patient mostly wheelchair level in home PTA Pt/Family Agrees to Admission and willing to participate: Yes Program Orientation Provided & Reviewed with Pt/Caregiver Including Roles  & Responsibilities: Yes Additional Information Needs: None Information Needs to be Provided By: N/A  Decrease burden of Care through IP rehab admission: No  Possible need for SNF placement upon discharge: No  Patient Condition: This patient's condition remains as documented in the consult dated 10/13/16, in which the Rehabilitation Physician determined and documented that the patient's condition is appropriate for intensive rehabilitative care in an inpatient rehabilitation facility. Will admit to inpatient rehab today.  Preadmission Screen Completed By:  Gunnar Fusi, 10/14/2016 11:47 AM ______________________________________________________________________   Discussed status with Dr. Naaman Plummer on 10/14/16 at 69 and received telephone approval for admission today.  Admission Coordinator:  Gunnar Fusi, time 1110/Date 10/14/16       Cosigned by: [name], MD at 10/14/2016 1:28 PM  Revision History

## 2016-10-15 NOTE — Evaluation (Signed)
Physical Therapy Assessment and Plan  Patient Details  Name: Julia Floyd MRN: [id number] Date of Birth: 06-21-1930  PT Diagnosis: Abnormality of gait, Difficulty walking and Muscle weakness Rehab Potential: Good ELOS: 14-17days    Today's Date: 10/15/2016 PT Individual Time: 5003-7048 PT Individual Time Calculation (min): 74 min    Problem List:  Patient Active Problem List   Diagnosis Date Noted  . Intraparenchymal hemorrhage of brain ([place]) 10/14/2016  . Received tissue plasminogen activator (t-PA) less than 24 hours prior to arrival 10/09/2016  . ICH (intracerebral hemorrhage) ([place]) 10/09/2016  . Cytotoxic brain edema ([place]) 10/09/2016  . Stroke (cerebrum) ([place]) -  Suspect stroke/TIA s/p tPA with multifocal ICH - etiology unclear, concerning for underlying CAA 10/07/2016  . HTN (hypertension)   . Diabetes ([place])   . Depression   . DDD (degenerative disc disease)   . Uterine fibroid   . Trigeminal neuralgia   . Sixth cranial nerve palsy     Past Medical History:  Past Medical History:  Diagnosis Date  . DDD (degenerative disc disease)   . Depression   . Diabetes ([place])   . HTN (hypertension)   . Psychotic disorder    secondary to the below factors. 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 Bipolar disorder, most likely type 3, this episode being manic  . Sixth cranial nerve palsy   . Trigeminal neuralgia   . Uterine fibroid    Past Surgical History:  Past Surgical History:  Procedure Laterality Date  . BRONCHOSCOPY    . TRACHEOSTOMY      Assessment & Plan Clinical Impression: Patient is a 83 y.o.right handed femalewith history of remote stroke, Alzheimer's dementia, trigeminal neuralgia, diabetes mellitus and hypertension. Per chart review patient lives with family. One level home with ramped entrance. She has a home health aide 2 hours daily to assist with ADLs. Patient performs sit to stand and stand pivot transfers modified independent  prior to admission. Presented 10/07/2016 with altered mental status and inability to speak. Cranial CT scan negative for acute changes. Patient did receive TPA. CT angiogram head and neck showed no large or medium vessel occlusion identified. Left subclavian artery with 50% stenosis. Follow-up CTI showed interval development of multifocal intraparenchymal hemorrhages no significant mass effect. MRI showed no new hemorrhage is present there was some surrounding vasogenic edema with small local mass effect particularly in the left cerebellum. Echocardiogram with ejection fraction 88% grade 1 diastolic dysfunction. Neurology follow-up with close monitoring. No current anti-thrombotic due to [place]. Dysphagia #3 thin liquid diet.   Patient transferred to CIR on 10/14/2016 .   Patient currently requires max with mobility secondary to muscle weakness, unbalanced muscle activation and decreased standing balance, hemiplegia and decreased balance strategies.  Prior to hospitalization, patient was min with mobility and lived with Daughter in a House home.  Home access is  Ramped entrance.  Patient will benefit from skilled PT intervention to maximize safe functional mobility, minimize fall risk and decrease caregiver burden for planned discharge home with 24 hour assist.  Anticipate patient will benefit from follow up [facility] at discharge.  PT - End of Session Activity Tolerance: Tolerates 10 - 20 min activity with multiple rests Endurance Deficit: Yes PT Assessment Rehab Potential (ACUTE/IP ONLY): Good PT Barriers to Discharge: Decreased caregiver support;Inaccessible home environment;Home environment access/layout PT Patient demonstrates impairments in the following area(s): Balance;Endurance;Motor;Nutrition;Sensory;Skin Integrity;Safety PT Transfers Functional Problem(s): Bed Mobility;Bed to Chair;Car;Furniture;Floor PT Locomotion Functional Problem(s): Ambulation;Wheelchair Mobility;Stairs PT Plan PT Intensity:  Minimum of 1-2 x/day ,45 to 90 minutes PT Frequency: 5 out of 7 days PT Duration Estimated Length of Stay: 14-17days  PT Treatment/Interventions: Ambulation/gait training;Balance/vestibular training;Cognitive remediation/compensation;Community reintegration;Discharge planning;Disease management/prevention;DME/adaptive equipment instruction;Functional mobility training;Neuromuscular re-education;Pain management;Functional electrical stimulation;Patient/family education;Psychosocial support;Skin care/wound management;Splinting/orthotics;Stair training;Therapeutic Activities;Therapeutic Exercise;UE/LE Strength taining/ROM;UE/LE Coordination activities;Visual/perceptual remediation/compensation;Wheelchair propulsion/positioning PT Transfers Anticipated Outcome(s): Supervision assist  PT Locomotion Anticipated Outcome(s): Mod I at [facility] level  PT Recommendation Follow Up Recommendations: Home health PT Patient destination: Home Equipment Recommended: Rolling walker with 5" wheels;To be determined  Skilled Therapeutic Intervention PT instructed patient in PT Evaluation and initiated treatment intervention; see below for results. PT educated patient in [place], rehab potential, rehab goals, and discharge recommendations. Pt returned to room following eval in WC, and left sitting with call bell in reach and all needs met.   PT Evaluation Precautions/Restrictions Precautions Precautions: Fall Precaution Comments: General weakness L>R  Restrictions Weight Bearing Restrictions: No General   Vital Signs Pain Pain Assessment Pain Assessment: No/denies pain Pain Score: 0-No pain Home Living/Prior Functioning Home Living Available Help at Discharge: Family;Available 24 hours/day Type of Home: House Home Access: Ramped entrance Home Layout: One level Bathroom Shower/Tub: Tub/shower unit;Other (comment) (Pt taking sponge baths) Bathroom Toilet: Standard Additional Comments: Pt has an aide that comes every  morning for 2 hours to assist with ADL  Lives With: Daughter Prior Function Level of Independence: Needs assistance with ADLs;Other (comment) (Uses power chair for all mobility. )  Able to Take Stairs?: No Driving: No Vocation: On disability Vision/Perception    WFL Cognition Overall Cognitive Status: History of cognitive impairments - at baseline Arousal/Alertness: Awake/alert Orientation Level: Oriented X4 Attention: Sustained Sustained Attention: Appears intact Memory: Impaired Memory Impairment: Decreased recall of new information;Decreased short term memory Decreased Short Term Memory: Verbal basic;Functional basic Awareness: Impaired Awareness Impairment: Emergent impairment Problem Solving: Appears intact Safety/Judgment: Appears intact Sensation Sensation Light Touch: Impaired by gross assessment (Pt ableto detect all light touch stimuli, but reports decreased appreciation to light touch on the L) Proprioception: Impaired by gross assessment (decreased in the LLE) Coordination Gross Motor Movements are Fluid and Coordinated: No Fine Motor Movements are Fluid and Coordinated: Yes Coordination and Movement Description: mild discordination due to decreased strength.  Motor  Motor Motor: Hemiplegia Motor - Skilled Clinical Observations: General weakness, L>R   Mobility Bed Mobility Bed Mobility: Rolling Right;Rolling Left;Sit to Supine;Supine to Sit Rolling Right: 5: Supervision Rolling Left: 5: Supervision Supine to Sit: 3: Mod assist Sit to Supine: 3: Mod assist Transfers Transfers: Yes Sit to Stand: 2: Max assist Sit to Stand Details: Verbal cues for sequencing;Verbal cues for precautions/safety Stand Pivot Transfers: 2: Max assist Stand Pivot Transfer Details: Verbal cues for safe use of DME/AE;Verbal cues for precautions/safety Squat Pivot Transfers: 2: Max Risk manager Details: Verbal cues for precautions/safety;Verbal cues for  sequencing Transfer via Lift Equipment: Stedy Locomotion  Ambulation Ambulation: Yes Ambulation/Gait Assistance: 2: Max assist Ambulation Distance (Feet): 4 Feet Assistive device: Parallel bars Ambulation/Gait Assistance Details: Verbal cues for precautions/safety;Verbal cues for gait pattern;Verbal cues for safe use of DME/AE Gait Gait: Yes Gait Pattern: Decreased stance time - left;Decreased step length - left;Decreased step length - right;Left steppage Stairs / Additional Locomotion Stairs: No Architect: Yes Wheelchair Assistance: 4: Advertising account executive Details: Verbal cues for technique;Verbal cues for precautions/safety;Verbal cues for safe use of DME/AE Wheelchair Propulsion: Both upper extremities Wheelchair Parts Management: Needs assistance Distance: 84f   Trunk/Postural Assessment  Cervical Assessment Cervical  Assessment: Exceptions to [facility] (Forward head) Thoracic Assessment Thoracic Assessment: Exceptions to [facility] (very kyphotic) Lumbar Assessment Lumbar Assessment: Exceptions to [facility] (Posterior pelvic tilt ) Postural Control Postural Control: Deficits on evaluation  Balance Balance Balance Assessed: Yes Static Sitting Balance Static Sitting - Balance Support: Feet supported Static Sitting - Level of Assistance: 5: Stand by assistance Dynamic Sitting Balance Dynamic Sitting - Balance Support: During functional activity;Feet supported Dynamic Sitting - Level of Assistance: 4: Min assist Sitting balance - Comments: LOB sitting in w/c completing bathing tasks Static Standing Balance Static Standing - Balance Support: During functional activity;Bilateral upper extremity supported Static Standing - Level of Assistance: 2: Max assist;1: +1 Total assist Static Stance: on Foam, Eyes Closed: Standing for hygiene and clothing management to be completed; B UE support on grab bar Dynamic Standing Balance Dynamic Standing - Balance  Support: During functional activity;Bilateral upper extremity supported Dynamic Standing - Level of Assistance: 2: Max assist Extremity Assessment      RLE Assessment RLE Assessment: Exceptions to [facility] RLE Strength RLE Overall Strength Comments: 4-/5 proximal to distal  LLE Assessment LLE Assessment: Exceptions to [facility] (4-/5 hip flexion 3+/5 knee extension and flexion, ankle DF. )   See Function Navigator for Current Functional Status.   Refer to Care Plan for Long Term Goals  Recommendations for other services: None   Discharge Criteria: Patient will be discharged from PT if patient refuses treatment 3 consecutive times without medical reason, if treatment goals not met, if there is a change in medical status, if patient makes no progress towards goals or if patient is discharged from hospital.  The above assessment, treatment plan, treatment alternatives and goals were discussed and mutually agreed upon: by patient  Lorie Phenix 10/15/2016, 9:24 AM

## 2016-10-15 NOTE — Plan of Care (Signed)
Problem: RH BOWEL ELIMINATION Goal: RH STG MANAGE BOWEL WITH ASSISTANCE STG Manage Bowel with mod  Assistance. Outcome: Not Progressing Pt is incontinent  Problem: RH BLADDER ELIMINATION Goal: RH STG MANAGE BLADDER WITH ASSISTANCE STG Manage Bladder With mod Assistance Outcome: Not Progressing Incontinent, pure wick catheter used  Problem: RH SKIN INTEGRITY Goal: RH STG SKIN FREE OF INFECTION/BREAKDOWN Outcome: Progressing No s/s of breakdown, no infection. Goal: RH STG MAINTAIN SKIN INTEGRITY WITH ASSISTANCE STG Maintain Skin Integrity With Assistance. Outcome: Not Progressing Pt requires maximum assist for skin maintenance.  Problem: RH PAIN MANAGEMENT Goal: RH STG PAIN MANAGED AT OR BELOW PT'S PAIN GOAL Outcome: Progressing Pt denies pain  Problem: Consults Goal: RH GENERAL PATIENT EDUCATION See Patient Education module for education specifics. Outcome: Progressing Reviewed rehab schedule and orient to unit upon admission. Goal: Skin Care Protocol Initiated - if Braden Score 18 or less If consults are not indicated, leave blank or document N/A Outcome: Progressing No s/s of skin breakdown  Problem: RH SKIN INTEGRITY Goal: RH STG SKIN FREE OF INFECTION/BREAKDOWN Skin integrity maintained with mod assist Outcome: Progressing No s/s of skin breakdonw

## 2016-10-15 NOTE — Care Management Note (Signed)
Inpatient Rehabilitation Center Individual Statement of Services  Patient Name:  Julia Floyd  Date:  10/15/2016  Welcome to the [facility].  Our goal is to provide you with an individualized program based on your diagnosis and situation, designed to meet your specific needs.  With this comprehensive rehabilitation program, you will be expected to participate in at least 3 hours of rehabilitation therapies Monday-Friday, with modified therapy programming on the weekends.  Your rehabilitation program will include the following services:  Physical Therapy (PT), Occupational Therapy (OT), Speech Therapy (ST), 24 hour per day rehabilitation nursing, Neuropsychology, Case Management (Social Worker), Rehabilitation Medicine, Nutrition Services and Pharmacy Services  Weekly team conferences will be held on Wednesday to discuss your progress.  Your Social Worker will talk with you frequently to get your input and to update you on team discussions.  Team conferences with you and your family in attendance may also be held.  Expected length of stay: 2 weeks Overall anticipated outcome: min assist level  Depending on your progress and recovery, your program may change. Your Social Worker will coordinate services and will keep you informed of any changes. Your Social Worker's name and contact numbers are listed  below.  The following services may also be recommended but are not provided by the [facility]:    [place] will be made to provide these services after discharge if needed.  Arrangements include referral to agencies that provide these services.  Your insurance has been verified to be:  [employer]Medicare Your primary doctor is:  Molli Hazard  Pertinent information will be shared with your doctor and your insurance company.  Social Worker:  Ovidio Kin, [place] or  ([phone]  Information discussed with and copy given to patient by: [name], 10/15/2016, 9:41 AM

## 2016-10-15 NOTE — Progress Notes (Signed)
[name], MD Physician Signed Physical Medicine and Rehabilitation  Consult Note Date of Service: 10/13/2016 6:38 AM  Related encounter: ED to Hosp-Admission (Discharged) from 10/07/2016 in [place] All Collapse All   [] Hide copied text [] Hover for attribution information      Physical Medicine and Rehabilitation Consult Reason for Consult: Decreased functional mobility with acute aphasia Referring Physician: Dr.Xu   HPI: Julia Floyd is a 83 y.o. right handed female with history of remote stroke, Alzheimer's dementia, trigeminal neuralgia, diabetes mellitus and hypertension. Per chart review patient lives with family. One level home with ramped entrance. She has a home health aide 2 hours daily to assist with ADLs. Patient performs sit to stand and stand pivot transfers modified independent prior to admission. Presented 10/07/2016 with altered mental status and inability to speak. Cranial CT scan negative for acute changes. Patient did receive TPA. CT angiogram head and neck showed no large or medium vessel occlusion identified. Left subclavian artery with 50% stenosis. Follow-up CTI showed interval development of multifocal intraparenchymal hemorrhages no significant mass effect. MRI showed no new hemorrhage is present there was some surrounding vasogenic edema with small local mass effect particularly in the left cerebellum. Echocardiogram with ejection fraction 24% grade 1 diastolic dysfunction. Neurology follow-up with close monitoring. No current anti-thrombotic due to [place]. Dysphagia #3 thin liquid diet. Physical therapy evaluation completed with recommendations of physical medicine rehabilitation consult.   Review of Systems  Constitutional: Negative for chills and fever.  Eyes: Negative for blurred vision and double vision.  Respiratory: Negative for cough and shortness of breath.   Cardiovascular: Positive  for leg swelling. Negative for chest pain and palpitations.  Gastrointestinal: Positive for blood in stool. Negative for nausea and vomiting.  Genitourinary: Positive for urgency. Negative for dysuria, flank pain and hematuria.  Musculoskeletal: Positive for joint pain and myalgias.  Neurological: Positive for speech change.  Psychiatric/Behavioral: Positive for depression and memory loss.  All other systems reviewed and are negative.      Past Medical History:  Diagnosis Date  . DDD (degenerative disc disease)   . Depression   . Diabetes ([place])   . HTN (hypertension)   . Psychotic disorder    secondary to the below factors. 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 Bipolar disorder, most likely type 3, this episode being manic  . Sixth cranial nerve palsy   . Trigeminal neuralgia   . Uterine fibroid         Past Surgical History:  Procedure Laterality Date  . BRONCHOSCOPY    . TRACHEOSTOMY          Family History  Problem Relation Age of Onset  . Heart attack Unknown   . Diabetes Unknown    Social History:  reports that she has quit smoking. She has never used smokeless tobacco. She reports that she does not drink alcohol or use drugs. Allergies: No Known Allergies       Medications Prior to Admission  Medication Sig Dispense Refill  . amLODipine (NORVASC) 5 MG tablet Take 1 tablet (5 mg total) by mouth daily. 5 tablet 0  . aspirin 325 MG tablet Take 325 mg by mouth daily.    . cyanocobalamin 500 MCG tablet Take 500 mcg by mouth. Vitamin B12    . furosemide (LASIX) 40 MG tablet Take 1 tablet (40 mg total) by mouth daily. 5 tablet 0  . gabapentin (NEURONTIN) 100  MG capsule Take 100-200 mg by mouth at bedtime.    . metFORMIN (GLUCOPHAGE) 500 MG tablet Take 500-1,000 mg by mouth 2 (two) times daily with a meal.       Home: Home Living Family/patient expects to be discharged to:: Private residence Living Arrangements:  Children Available Help at Discharge: Family, Available 24 hours/day Type of Home: House Home Access: Ramped entrance Home Layout: One level Bathroom Shower/Tub: Tub/shower unit, Other (comment) (Pt taking sponge baths) Bathroom Toilet: Standard Home Equipment: Bedside commode, Tub bench, Grab bars - toilet, Wheelchair - power, Hospital bed Additional Comments: Pt has an aide that comes every morning for 2 hours to assist with ADL  Lives With: Daughter  Functional History: Prior Function Level of Independence: Needs assistance Gait / Transfers Assistance Needed: per family, pt performed sit<>stand and stand-pivot transfers with mod I ADL's / Homemaking Assistance Needed: assist with ADLs from aide that comes for 2 hours every morning and daughters, feeds self Functional Status:  Mobility: Bed Mobility Overal bed mobility: Needs Assistance Bed Mobility: Sit to Supine Supine to sit: Mod assist, HOB elevated Sit to supine: Max assist General bed mobility comments: max A for assist with BLE back into bed, and then +2 for scooting in bed Transfers Overall transfer level: Needs assistance Equipment used: 2 person hand held assist Transfer via Lift Equipment: Stedy Transfers: Sit to/from Stand Sit to Stand: +2 safety/equipment, Max assist Stand pivot transfers: Max assist, +2 physical assistance General transfer comment: use of STEDY, use of bedpad to assist Pt in boost Ambulation/Gait General Gait Details: pt uses a w/c for mobility at baseline  ADL: ADL Overall ADL's : Needs assistance/impaired Eating/Feeding: Set up, Sitting Grooming: Minimal assistance, Sitting Upper Body Bathing: Moderate assistance Lower Body Bathing: Moderate assistance Upper Body Dressing : Minimal assistance Lower Body Dressing: Maximal assistance Toilet Transfer: +2 for physical assistance, +2 for safety/equipment, Maximal assistance (STEDY used for Pt and therapist safety, NT assisting) Toilet  Transfer Details (indicate cue type and reason): simulated through transfer to bed Toileting- Clothing Manipulation and Hygiene:  (Pt currently using external female catheter) Toileting - Clothing Manipulation Details (indicate cue type and reason): Pt currently using external catheter Tub/ Shower Transfer: Maximal assistance, +2 for physical assistance, Stand-pivot, Tub bench General ADL Comments: Pt with siginificant decreased independence in transfers necessary for ADL  Cognition: Cognition Overall Cognitive Status: History of cognitive impairments - at baseline Orientation Level: Oriented to person, Oriented to place, Oriented to situation Cognition Arousal/Alertness: Awake/alert Behavior During Therapy: WFL for tasks assessed/performed Overall Cognitive Status: History of cognitive impairments - at baseline General Comments: pt with Alzheimer's dementia at baseline  Blood pressure (!) 133/54, pulse 69, temperature 98.3 F (36.8 C), temperature source Oral, resp. rate 18, height 5\' 6"  (1.676 m), weight 71.5 kg (157 lb 10.1 oz), SpO2 100 %. Physical Exam  Vitals reviewed. Constitutional: She appears well-developed.  HENT:  Head: Normocephalic.  Eyes: EOM are normal.  Neck: Normal range of motion. Neck supple. No thyromegaly present.  Cardiovascular: Normal rate, regular rhythm and normal heart sounds.   Respiratory: Effort normal and breath sounds normal. No respiratory distress.  GI: Soft. Bowel sounds are normal. She exhibits no distension.  Neurological: She is alert.  Makes good eye contact with examiner. Follows simple commands. Provides her name and age. Fair awareness of deficits. RUE and RLE 4 to 4+/5. LUE 4/5 prox to distal. Fair coordination. LLE: 2/5 HF, 2+KE and 3/5 ADF/PF. Oriented x2. Fair insight and awareness. Senses  pain and light touch equally all 4's.   Skin: Skin is warm and dry.  Psychiatric: She has a normal mood and affect. Her behavior is normal. Judgment  and thought content normal.    Lab Results Last 24 Hours       Results for orders placed or performed during the hospital encounter of 10/07/16 (from the past 24 hour(s))  CBC     Status: Abnormal   Collection Time: 10/13/16  4:12 AM  Result Value Ref Range   WBC 5.3 4.0 - 10.5 K/uL   RBC 2.90 (L) 3.87 - 5.11 MIL/uL   Hemoglobin 8.5 (L) 12.0 - 15.0 g/dL   HCT 27.5 (L) 36.0 - 46.0 %   MCV 94.8 78.0 - 100.0 fL   MCH 29.3 26.0 - 34.0 pg   MCHC 30.9 30.0 - 36.0 g/dL   RDW 16.4 (H) 11.5 - 15.5 %   Platelets 122 (L) 150 - 400 K/uL  Basic metabolic panel     Status: Abnormal   Collection Time: 10/13/16  4:12 AM  Result Value Ref Range   Sodium 139 135 - 145 mmol/L   Potassium 3.4 (L) 3.5 - 5.1 mmol/L   Chloride 107 101 - 111 mmol/L   CO2 28 22 - 32 mmol/L   Glucose, Bld 108 (H) 65 - 99 mg/dL   BUN 5 (L) 6 - 20 mg/dL   Creatinine, Ser 0.61 0.44 - 1.00 mg/dL   Calcium 8.5 (L) 8.9 - 10.3 mg/dL   GFR calc non Af Amer >60 >60 mL/min   GFR calc Af Amer >60 >60 mL/min   Anion gap 4 (L) 5 - 15      Imaging Results (Last 48 hours)  Mr Jeri Cos Wo Contrast  Result Date: 10/12/2016 CLINICAL DATA:  Multiple subcortical brainstem hemorrhages following tPA. Acute infarct 5 days ago. EXAM: MRI HEAD WITHOUT AND WITH CONTRAST MRV HEAD WITHOUT CONTRAST TECHNIQUE: Multiplanar, multiecho pulse sequences of the brain and surrounding structures were obtained without and with intravenous contrast. Angiographic images of the head were obtained using MRV technique without contrast. CONTRAST:  <See Chart> MULTIHANCE GADOBENATE DIMEGLUMINE 529 MG/ML IV SOLN COMPARISON:  CT head without contrast 10/09/2016 and 10/08/2016. FINDINGS: MRI HEAD FINDINGS Brain: Multiple subcortical hemorrhages are again noted bilaterally. Prominent areas involving the right temporal tip, left cerebellum, posterior inferior left temporal lobe and left frontal operculum are stable. No new areas of hemorrhage are  present. There is no significant progression of these hemorrhages. Extensive surrounding vasogenic edema is associated. Local mass effect is present without midline shift or herniation. The postcontrast images demonstrate intrinsic T1 shortening of the hemorrhagic areas without associated enhancement. A meningioma along the anterior inferior falx measures 8 x 8 x 5 mm. No other pathologic enhancement is present. No areas of acute nonhemorrhagic infarct are present. The ventricles are proportionate to the degree of atrophy. No significant extra-axial fluid collection is present. Vascular: Flow is present in the major intracranial arteries. Skull and upper cervical spine: The skullbase is within normal limits. The craniocervical junction is normal. The upper cervical spine is within normal limits. Marrow signal is normal. Sinuses/Orbits: The paranasal sinuses and mastoid air cells are clear. The globes and orbits are within normal limits. MRV HEAD FINDINGS The dural sinuses demonstrate normal flow. The transverse sinuses are codominant. The straight sinus and deep cerebral veins are intact. Cortical veins are unremarkable. IMPRESSION: 1. Stable appearance of multiple scattered subcortical hemorrhages as previously described. 2. Surrounding vasogenic edema with  some local mass effect, particularly in the left cerebellum. There is no midline shift or herniation. 3. No new hemorrhages are present. 4. No pathologic enhancement. 5. Normal MR venogram of the head. Electronically Signed   By: San Morelle M.D.   On: 10/12/2016 17:54   Mr Mrv Head Wo Cm  Result Date: 10/12/2016 CLINICAL DATA:  Multiple subcortical brainstem hemorrhages following tPA. Acute infarct 5 days ago. EXAM: MRI HEAD WITHOUT AND WITH CONTRAST MRV HEAD WITHOUT CONTRAST TECHNIQUE: Multiplanar, multiecho pulse sequences of the brain and surrounding structures were obtained without and with intravenous contrast. Angiographic images of the  head were obtained using MRV technique without contrast. CONTRAST:  <See Chart> MULTIHANCE GADOBENATE DIMEGLUMINE 529 MG/ML IV SOLN COMPARISON:  CT head without contrast 10/09/2016 and 10/08/2016. FINDINGS: MRI HEAD FINDINGS Brain: Multiple subcortical hemorrhages are again noted bilaterally. Prominent areas involving the right temporal tip, left cerebellum, posterior inferior left temporal lobe and left frontal operculum are stable. No new areas of hemorrhage are present. There is no significant progression of these hemorrhages. Extensive surrounding vasogenic edema is associated. Local mass effect is present without midline shift or herniation. The postcontrast images demonstrate intrinsic T1 shortening of the hemorrhagic areas without associated enhancement. A meningioma along the anterior inferior falx measures 8 x 8 x 5 mm. No other pathologic enhancement is present. No areas of acute nonhemorrhagic infarct are present. The ventricles are proportionate to the degree of atrophy. No significant extra-axial fluid collection is present. Vascular: Flow is present in the major intracranial arteries. Skull and upper cervical spine: The skullbase is within normal limits. The craniocervical junction is normal. The upper cervical spine is within normal limits. Marrow signal is normal. Sinuses/Orbits: The paranasal sinuses and mastoid air cells are clear. The globes and orbits are within normal limits. MRV HEAD FINDINGS The dural sinuses demonstrate normal flow. The transverse sinuses are codominant. The straight sinus and deep cerebral veins are intact. Cortical veins are unremarkable. IMPRESSION: 1. Stable appearance of multiple scattered subcortical hemorrhages as previously described. 2. Surrounding vasogenic edema with some local mass effect, particularly in the left cerebellum. There is no midline shift or herniation. 3. No new hemorrhages are present. 4. No pathologic enhancement. 5. Normal MR venogram of the head.  Electronically Signed   By: San Morelle M.D.   On: 10/12/2016 17:54     Assessment/Plan: Diagnosis: multifocal intraparenchymal hemorrhages 1. Does the need for close, 24 hr/day medical supervision in concert with the patient's rehab needs make it unreasonable for this patient to be served in a less intensive setting? Yes 2. Co-Morbidities requiring supervision/potential complications: dm, htn 3. Due to bladder management, bowel management, safety, skin/wound care, disease management, medication administration, pain management and patient education, does the patient require 24 hr/day rehab nursing? Yes 4. Does the patient require coordinated care of a physician, rehab nurse, PT (1-2 hrs/day, 5 days/week), OT (1-2 hrs/day, 5 days/week) and SLP (1-2 hrs/day, 5 days/week) to address physical and functional deficits in the context of the above medical diagnosis(es)? Yes Addressing deficits in the following areas: balance, endurance, locomotion, strength, transferring, bowel/bladder control, bathing, dressing, feeding, grooming, toileting, cognition and psychosocial support 5. Can the patient actively participate in an intensive therapy program of at least 3 hrs of therapy per day at least 5 days per week? Yes 6. The potential for patient to make measurable gains while on inpatient rehab is excellent 7. Anticipated functional outcomes upon discharge from inpatient rehab are modified independent to supervision  with PT, modified independent and supervision with OT, modified independent with SLP. 8. Estimated rehab length of stay to reach the above functional goals is: 14-18 days 9. Anticipated D/C setting: Home 10. Anticipated post D/C treatments: [place] therapy 11. Overall Rehab/Functional Prognosis: excellent  RECOMMENDATIONS: This patient's condition is appropriate for continued rehabilitative care in the following setting: CIR Patient has agreed to participate in recommended program.  Yes Note that insurance prior authorization may be required for reimbursement for recommended care.  Comment: Rehab Admissions Coordinator to follow up.  Thanks,  [name], MD, Mellody Drown    Cathlyn Parsons., PA-C 10/13/2016    Revision History                        Routing History

## 2016-10-15 NOTE — Progress Notes (Signed)
Patient information reviewed and entered into eRehab system by Muhanad Torosyan, RN, CRRN, PPS Coordinator.  Information including medical coding and functional independence measure will be reviewed and updated through discharge.     Per nursing patient was given "Data Collection Information Summary for Patients in Inpatient Rehabilitation Facilities with attached "Privacy Act Statement-Health Care Records" upon admission.  

## 2016-10-16 ENCOUNTER — Inpatient Hospital Stay (HOSPITAL_COMMUNITY): Payer: Medicare Other | Admitting: Physical Therapy

## 2016-10-16 ENCOUNTER — Inpatient Hospital Stay (HOSPITAL_COMMUNITY): Payer: Medicare Other | Admitting: Occupational Therapy

## 2016-10-16 NOTE — Progress Notes (Signed)
Occupational Therapy Session Note  Patient Details  Name: Julia Floyd MRN: [id number] Date of Birth: 04-02-1930  Today's Date: 10/16/2016 OT Individual Time: 1300-1400 OT Individual Time Calculation (min): 60 min    Short Term Goals: Week 1:  OT Short Term Goal 1 (Week 1): Pt will complete toilet transfer with mod A OT Short Term Goal 2 (Week 1): Pt will don shirt with min A OT Short Term Goal 3 (Week 1): Pt will dress LB with mod A OT Short Term Goal 4 (Week 1): Pt will stand with mod A in order for caregiver to provide care  Skilled Therapeutic Interventions/Progress Updates:    Pt seen for OT ADL bathing/dressing session. Pt sitting up in w/c upon arrival, agreeable to tx session. She was agreeable to bathing at shower level today. Used STEDY for transfers throughout session, requiring max A in order stand and difficulty achieving erect posture needed for use of STEDY. She bathed seated on 3-1 BSC, able to reach under seat in order to perform buttock hygiene. Pt with incontinent BM in shower and unaware she had BM. She transferred out of shower and dressed seated in w/c. With increased time, she was able to orient clothing and dress UB. Pants donned total A, standing in STEDY for clothing management to be completed. Pt left seated in w/c at end of session with daughter present and all needs in reach.  Requested daughter take pictures of home bathroom in order to assess needed DME and layout in prep for d/c home.   Therapy Documentation Precautions:  Precautions Precautions: Fall Precaution Comments: General weakness L>R; dementia Restrictions Weight Bearing Restrictions: No Pain:   No/ denies pain  See Function Navigator for Current Functional Status.   Therapy/Group: Individual Therapy  Lewis, Sherwood Castilla C 10/16/2016, 7:07 AM

## 2016-10-16 NOTE — Progress Notes (Addendum)
Subjective/Complaints: No issues overnite, buttock pain  ROS- denies CP/SOB, - N/V/D  Objective: Vital Signs: Blood pressure (!) 136/42, pulse 69, temperature 98.5 F (36.9 C), temperature source Oral, resp. rate 16, height 5' 6"  (1.676 m), weight 78.4 kg (172 lb 14.9 oz), SpO2 100 %. No results found. Results for orders placed or performed during the hospital encounter of 10/14/16 (from the past 72 hour(s))  CBC WITH DIFFERENTIAL     Status: Abnormal   Collection Time: 10/15/16  4:38 AM  Result Value Ref Range   WBC 4.8 4.0 - 10.5 K/uL   RBC 2.92 (L) 3.87 - 5.11 MIL/uL   Hemoglobin 8.5 (L) 12.0 - 15.0 g/dL   HCT 27.7 (L) 36.0 - 46.0 %   MCV 94.9 78.0 - 100.0 fL   MCH 29.1 26.0 - 34.0 pg   MCHC 30.7 30.0 - 36.0 g/dL   RDW 16.0 (H) 11.5 - 15.5 %   Platelets 131 (L) 150 - 400 K/uL   Neutrophils Relative % 49 %   Lymphocytes Relative 24 %   Monocytes Relative 27 %   Eosinophils Relative 0 %   Basophils Relative 0 %   Neutro Abs 2.3 1.7 - 7.7 K/uL   Lymphs Abs 1.2 0.7 - 4.0 K/uL   Monocytes Absolute 1.3 (H) 0.1 - 1.0 K/uL   Eosinophils Absolute 0.0 0.0 - 0.7 K/uL   Basophils Absolute 0.0 0.0 - 0.1 K/uL   RBC Morphology POLYCHROMASIA PRESENT   Comprehensive metabolic panel     Status: Abnormal   Collection Time: 10/15/16  4:38 AM  Result Value Ref Range   Sodium 138 135 - 145 mmol/L   Potassium 3.7 3.5 - 5.1 mmol/L   Chloride 107 101 - 111 mmol/L   CO2 28 22 - 32 mmol/L   Glucose, Bld 118 (H) 65 - 99 mg/dL   BUN 5 (L) 6 - 20 mg/dL   Creatinine, Ser 0.69 0.44 - 1.00 mg/dL   Calcium 8.6 (L) 8.9 - 10.3 mg/dL   Total Protein 6.2 (L) 6.5 - 8.1 g/dL   Albumin 2.5 (L) 3.5 - 5.0 g/dL   AST 18 15 - 41 U/L   ALT 9 (L) 14 - 54 U/L   Alkaline Phosphatase 64 38 - 126 U/L   Total Bilirubin 0.8 0.3 - 1.2 mg/dL   GFR calc non Af Amer >60 >60 mL/min   GFR calc Af Amer >60 >60 mL/min    Comment: (NOTE) The eGFR has been calculated using the CKD EPI equation. This calculation has not  been validated in all clinical situations. eGFR's persistently <60 mL/min signify possible Chronic Kidney Disease.    Anion gap 3 (L) 5 - 15  Pathologist smear review     Status: None   Collection Time: 10/15/16  4:38 AM  Result Value Ref Range   Path Review Anemia and thrombocytopenia.     Comment: Leukocytes morphologically unremarkable. Reviewed by Marlynn Perking. Melina Copa, M.D. 10/15/2016      HEENT: normal Cardio: RRR and no murmur Resp: CTA B/L and unlabored GI: BS positive and NT, ND Extremity:  No Edema Skin:   Intact, pain over the sacrum but no breakdown Neuro: Alert/Oriented, Abnormal Sensory LT diminished on left side and Abnormal Motor 4/5 BUE, 4- BLE, 3- LLE, min dysmetria L FNF Musc/Skel:  Other no pain with UE or LE ROM Gen NAD   Assessment/Plan: 1. 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  which require 3+ hours per day of interdisciplinary therapy in  a comprehensive inpatient rehab setting. Physiatrist is providing close team supervision and 24 hour management of active medical problems listed below. Physiatrist and rehab team continue to assess barriers to discharge/monitor patient progress toward functional and medical goals. FIM: Function - Bathing Position: Wheelchair/chair at sink Body parts bathed by patient: Right arm, Left arm, Chest, Abdomen, Left upper leg, Right upper leg Body parts bathed by helper: Front perineal area, Buttocks, Back, Left lower leg, Right lower leg  Function- Upper Body Dressing/Undressing What is the patient wearing?: Hospital gown Function - Lower Body Dressing/Undressing What is the patient wearing?: Non-skid slipper socks, Hospital Gown Position: Wheelchair/chair at sink Non-skid slipper socks- Performed by helper: Don/doff right sock, Don/doff left sock Assist for footwear: Dependant  Function - Toileting Toileting steps completed by helper: Adjust clothing prior to toileting, Performs perineal hygiene, Adjust clothing  after toileting Toileting Assistive Devices: Grab bar or rail Assist level: Two helpers  Function - Air cabin crew transfer assistive device: Grab bar, Elevated toilet seat/BSC over toilet Assist level to toilet: Maximal assist (Pt 25 - 49%/lift and lower) Assist level from toilet: Maximal assist (Pt 25 - 49%/lift and lower)  Function - Chair/bed transfer Chair/bed transfer method: Squat pivot Chair/bed transfer assist level: Maximal assist (Pt 25 - 49%/lift and lower)  Function - Locomotion: Wheelchair Type: Manual Max wheelchair distance: 100 Assist Level: Touching or steadying assistance (Pt > 75%) Assist Level: Touching or steadying assistance (Pt > 75%) Wheel 150 feet activity did not occur: Safety/medical concerns Function - Locomotion: Ambulation Assistive device: Parallel bars Max distance: 4 Assist level: Maximal assist (Pt 25 - 49%) Walk 10 feet activity did not occur: Safety/medical concerns Walk 50 feet with 2 turns activity did not occur: Safety/medical concerns Walk 150 feet activity did not occur: Safety/medical concerns Walk 10 feet on uneven surfaces activity did not occur: Safety/medical concerns  Function - Comprehension Comprehension: Auditory Comprehension assist level: Understands basic 90% of the time/cues < 10% of the time  Function - Expression Expression: Verbal Expression assist level: Expresses basic needs/ideas: With extra time/assistive device  Function - Social Interaction Social Interaction assist level: Interacts appropriately 90% of the time - Needs monitoring or encouragement for participation or interaction.  Function - Problem Solving Problem solving assist level: Solves basic problems with no assist  Function - Memory Memory assist level: Recognizes or recalls 75 - 89% of the time/requires cueing 10 - 24% of the time Medical Problem List and Plan: 1. Decreased functional mobility with aphasiasecondary to multifocal  intraparenchymal hemorrhages -CIR PT, OT, SLP evals 2. DVT Prophylaxis/Anticoagulation: SCDs.  3. Pain Management/trigeminal neuralgia: Resume Neurontin 100-200 mg at bedtime as needed- no change at present 4. Mood/Alzheimer's dementia: Provide emotional support.[place] with family 5. Neuropsych: This patient iscapable of making decisions on herown behalf. 6. Skin/Wound Care: Routine skin checks, foam prophyllactic sacral dressing 7. Fluids/Electrolytes/Nutrition: Routine I&O's with follow-up chemistries, meal intake 25% for dinner, alb low start prostat 8.Hypertension. Norvasc 10 mg daily, Lasix 40 mg daily  9Hyperlipidemia. Pravachol 10.  Urinary incont - had some problems PTA no dysuria, no fever may be CVA related spastic bladder , rec toileting program LOS (Days) 2 A FACE TO FACE EVALUATION WAS PERFORMED  KIRSTEINS,ANDREW E 10/16/2016, 7:07 AM

## 2016-10-16 NOTE — Progress Notes (Signed)
Physical Therapy Session Note  Patient Details  Name: Julia Floyd MRN: [id number] Date of Birth: [birth year]  Today's Date: 10/16/2016 PT Individual Time: 0802-0915 AND 1630-1700 PT Individual Time Calculation (min): 73 min AND 30 min   Short Term Goals: Week 1:  PT Short Term Goal 1 (Week 1): Pt will perform bed mobility with min assist from PT PT Short Term Goal 2 (Week 1): Pt will transfer to and from [facility] with min assist from PT PT Short Term Goal 3 (Week 1): Pt will propell WC 135f with min assist  PT Short Term Goal 4 (Week 1): Pt will perform sit<>stand with min assist and BUE support.   Skilled Therapeutic Interventions/Progress Updates:  Pt received supine in bed and agreeable to PT. Supine>sit transfer with mod  assist and mod cues for safety and proper use of bed features.    [facility]. Pt requires mod assist for improved scooting technique with emphasis on improved anterior weight shift. PT assist pt to finish breakfast sitting in WC with min assist to manage utensils. .   WC mobility instructed py PT through hall with min assist x 1286f Moderate cues for improved use of the LUE in order to maintain straight trajectory.   Blocked proactice sit<>stand in parallel bars mod assist progressing to min assist. PT required block the L knee initially.and provide max multinodal cues for increased anteriorerior  Blocked practice squat pivot transfer with mod assist to the R and the L. Max cues for prpoer set up and sequencing at this time.   Patient returned too room [facility] and all needs met.      Session 2.   Seated BLE therex with level 2 tband Isometric hip adduction, clam shells, HS curls, LAQ, calf raises, reciprocal marches. All completed x 10 BLE with min cues for proper speed and ROM to improve strengthening aspect of exercise. Pt left sitting in room with call bell in reach and all needs.  Met.         Therapy Documentation Precautions:  Precautions Precautions: Fall Precaution Comments: General weakness L>R; dementia Restrictions Weight Bearing Restrictions: No Pain. 0/10   See Function Navigator for Current Functional Status.   Therapy/Group: Individual Therapy  AuLorie Phenix/19/2018, 9:18 AM

## 2016-10-16 NOTE — Progress Notes (Signed)
Physical Therapy Session Note  [name]  Name: Julia Floyd MRN: [id number] Date of Birth: 19-Jul-1930  Today's Date: 10/16/2016 PT Individual Time: 1015-1045 PT Individual Time Calculation (min): 30 min   Short Term Goals: Week 1:  PT Short Term Goal 1 (Week 1): Pt will perform bed mobility with min assist from PT PT Short Term Goal 2 (Week 1): Pt will transfer to and from [facility] with min assist from PT PT Short Term Goal 3 (Week 1): Pt will propell WC 111f with min assist  PT Short Term Goal 4 (Week 1): Pt will perform sit<>stand with min assist and BUE support.   Skilled Therapeutic Interventions/Progress Updates:   Pt in w/c upon arrival and agreeable to therapy. Daughter present throughout session.   Pt propelled w/c 100 ft w/ Mod A for propulsion and verbal cues for technique. Moderate increase in fatigue.   Pt transferred chair <> mat w/ Max A via lateral scoot. Pt participated in dynamic sitting balance activities w/ close supervision. Activities included bilateral UE reaching tasks with verbal cues to lean forward when reaching outside of BOS.   Pt ended session in w/c in care of daughter, all needs met.   Therapy Documentation Precautions:  Precautions Precautions: Fall Precaution Comments: General weakness L>R; dementia Restrictions Weight Bearing Restrictions: No   See Function Navigator for Current Functional Status.   Therapy/Group: Individual Therapy  Britini Garcilazo K Arnette 10/16/2016, 12:48 PM

## 2016-10-17 ENCOUNTER — Inpatient Hospital Stay (HOSPITAL_COMMUNITY): Payer: Medicare Other | Admitting: Occupational Therapy

## 2016-10-17 ENCOUNTER — Inpatient Hospital Stay (HOSPITAL_COMMUNITY): Payer: Medicare Other | Admitting: Physical Therapy

## 2016-10-17 DIAGNOSIS — K5901 Slow transit constipation: Secondary | ICD-10-CM

## 2016-10-17 MED ORDER — SENNOSIDES-DOCUSATE SODIUM 8.6-50 MG PO TABS
1.0000 | ORAL_TABLET | Freq: Two times a day (BID) | ORAL | Status: DC
  Administered 2016-10-17 – 2016-10-23 (×11): 1 via ORAL
  Filled 2016-10-17 (×12): qty 1

## 2016-10-17 NOTE — Progress Notes (Signed)
Physical Therapy Session Note  Patient Details  Name: Julia Floyd MRN: [id number] Date of Birth: May 21, 1930  Today's Date: 10/17/2016 PT Individual Time: 9833-8250 PT Individual Time Calculation (min): 73 min   Short Term Goals: Week 1:  PT Short Term Goal 1 (Week 1): Pt will perform bed mobility with min assist from PT PT Short Term Goal 2 (Week 1): Pt will transfer to and from [facility] with min assist from PT PT Short Term Goal 3 (Week 1): Pt will propell WC 159f with min assist  PT Short Term Goal 4 (Week 1): Pt will perform sit<>stand with min assist and BUE support.   Skilled Therapeutic Interventions/Progress Updates:   Pt in w/c upon arrival and agreeable to therapy.   Switched out w/c secondary to brake dysfunction and performed sliding board transfer from old to new w/c w/ touching-supervision assist. Verbal cues for technique and sequencing.   Performed dynamic standing balance tasks w/ assist of standing frame in gym. Emphasis on thoracic extension for reaching tasks and verbal cues to decrease reliance on UE support.   NuStep 7 min and 5 min @ L1 to increase endurance and functional LE strength.   Ended session in w/c, in care of family and all needs met.   Therapy Documentation Precautions:  Precautions Precautions: Fall Precaution Comments: General weakness L>R; dementia Restrictions Weight Bearing Restrictions: No Vital Signs: Therapy Vitals Temp: 98.3 F (36.8 C) Temp Source: Oral Pulse Rate: 78 Resp: 18 BP: (!) 130/56 Patient Position (if appropriate): Sitting Oxygen Therapy SpO2: 100 % O2 Device: Not Delivered  See Function Navigator for Current Functional Status.   Therapy/Group: Individual Therapy  Amadu Schlageter K Arnette 10/17/2016, 3:08 PM

## 2016-10-17 NOTE — Progress Notes (Signed)
Subjective/Complaints:  No issues per patient overnight. Discussed with nursing, no bowel movement for several days. Not on any type of prophylactic medications. We discussed plan. No abdominal pain  ROS- denies CP/SOB, - N/V/D  Objective: Vital Signs: Blood pressure (!) 144/49, pulse 73, temperature 99.1 F (37.3 C), temperature source Oral, resp. rate 18, height _0  (1.676 m), weight 78.4 kg (172 lb 14.9 oz), SpO2 100 %. No results found. Results for orders placed or performed during the hospital encounter of 10/14/16 (from the past 72 hour(s))  CBC WITH DIFFERENTIAL     Status: Abnormal   Collection Time: 10/15/16  4:38 AM  Result Value Ref Range   WBC 4.8 4.0 - 10.5 K/uL   RBC 2.92 (L) 3.87 - 5.11 MIL/uL   Hemoglobin 8.5 (L) 12.0 - 15.0 g/dL   HCT 27.7 (L) 36.0 - 46.0 %   MCV 94.9 78.0 - 100.0 fL   MCH 29.1 26.0 - 34.0 pg   MCHC 30.7 30.0 - 36.0 g/dL   RDW 16.0 (H) 11.5 - 15.5 %   Platelets 131 (L) 150 - 400 K/uL   Neutrophils Relative % 49 %   Lymphocytes Relative 24 %   Monocytes Relative 27 %   Eosinophils Relative 0 %   Basophils Relative 0 %   Neutro Abs 2.3 1.7 - 7.7 K/uL   Lymphs Abs 1.2 0.7 - 4.0 K/uL   Monocytes Absolute 1.3 (H) 0.1 - 1.0 K/uL   Eosinophils Absolute 0.0 0.0 - 0.7 K/uL   Basophils Absolute 0.0 0.0 - 0.1 K/uL   RBC Morphology POLYCHROMASIA PRESENT   Comprehensive metabolic panel     Status: Abnormal   Collection Time: 10/15/16  4:38 AM  Result Value Ref Range   Sodium 138 135 - 145 mmol/L   Potassium 3.7 3.5 - 5.1 mmol/L   Chloride 107 101 - 111 mmol/L   CO2 28 22 - 32 mmol/L   Glucose, Bld 118 (H) 65 - 99 mg/dL   BUN 5 (L) 6 - 20 mg/dL   Creatinine, Ser 0.69 0.44 - 1.00 mg/dL   Calcium 8.6 (L) 8.9 - 10.3 mg/dL   Total Protein 6.2 (L) 6.5 - 8.1 g/dL   Albumin 2.5 (L) 3.5 - 5.0 g/dL   AST 18 15 - 41 U/L   ALT 9 (L) 14 - 54 U/L   Alkaline Phosphatase 64 38 - 126 U/L   Total Bilirubin 0.8 0.3 - 1.2 mg/dL   GFR calc non Af Amer >60 >60  mL/min   GFR calc Af Amer >60 >60 mL/min    Comment: (NOTE) The eGFR has been calculated using the CKD EPI equation. This calculation has not been validated in all clinical situations. eGFR's persistently <60 mL/min signify possible Chronic Kidney Disease.    Anion gap 3 (L) 5 - 15  Pathologist smear review     Status: None   Collection Time: 10/15/16  4:38 AM  Result Value Ref Range   Path Review Anemia and thrombocytopenia.     Comment: Leukocytes morphologically unremarkable. Reviewed by Marlynn Perking. Melina Copa, M.D. 10/15/2016      HEENT: normal Cardio: RRR and no murmur Resp: CTA B/L and unlabored GI: BS positive and NT, ND Extremity:  No Edema Skin:   Intact, pain over the sacrum but no breakdown Neuro: Alert/Oriented, Abnormal Sensory LT diminished on left side and Abnormal Motor 4/5 BUE, 4- BLE, 3- LLE, min dysmetria L FNF Musc/Skel:  Other no pain with UE or LE ROM  Gen NAD   Assessment/Plan: 1. 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 Physiatrist is providing close team supervision and 24 hour management of active medical problems listed below. Physiatrist and rehab team continue to assess barriers to discharge/monitor patient progress toward functional and medical goals. FIM: Function - Bathing Position: Shower Body parts bathed by patient: Right arm, Left arm, Chest, Abdomen, Left upper leg, Right upper leg, Buttocks, Front perineal area Body parts bathed by helper: Right lower leg, Left lower leg, Back  Function- Upper Body Dressing/Undressing What is the patient wearing?: Pull over shirt/dress Pull over shirt/dress - Perfomed by patient: Thread/unthread right sleeve, Thread/unthread left sleeve, Put head through opening Pull over shirt/dress - Perfomed by helper: Pull shirt over trunk Function - Lower Body Dressing/Undressing What is the patient wearing?:  Non-skid slipper socks, Pants Position: Wheelchair/chair at sink Pants- Performed by helper: Thread/unthread right pants leg, Thread/unthread left pants leg, Pull pants up/down Non-skid slipper socks- Performed by helper: Don/doff right sock, Don/doff left sock Assist for footwear: Dependant Assist for lower body dressing:  (Standing in STEDY)  Function - Toileting Toileting steps completed by helper: Adjust clothing prior to toileting, Performs perineal hygiene, Adjust clothing after toileting Toileting Assistive Devices: Grab bar or rail Assist level: Two helpers  Function - Air cabin crew transfer assistive device: Grab bar, Elevated toilet seat/BSC over toilet, Mechanical lift Mechanical lift: [place] Assist level to toilet: Maximal assist (Pt 25 - 49%/lift and lower) Assist level from toilet: Maximal assist (Pt 25 - 49%/lift and lower)  Function - Chair/bed transfer Chair/bed transfer method: Lateral scoot Chair/bed transfer assist level: Maximal assist (Pt 25 - 49%/lift and lower) Chair/bed transfer assistive device: Armrests  Function - Locomotion: Wheelchair Type: Manual Max wheelchair distance: 151f Assist Level: Touching or steadying assistance (Pt > 75%) Assist Level: Touching or steadying assistance (Pt > 75%) Wheel 150 feet activity did not occur: Safety/medical concerns Function - Locomotion: Ambulation Assistive device: Parallel bars Max distance: 4 Assist level: Maximal assist (Pt 25 - 49%) Walk 10 feet activity did not occur: Safety/medical concerns Walk 50 feet with 2 turns activity did not occur: Safety/medical concerns Walk 150 feet activity did not occur: Safety/medical concerns Walk 10 feet on uneven surfaces activity did not occur: Safety/medical concerns  Function - Comprehension Comprehension: Auditory Comprehension assist level: Understands basic 90% of the time/cues < 10% of the time  Function - Expression Expression: Verbal Expression  assist level: Expresses basic needs/ideas: With extra time/assistive device  Function - Social Interaction Social Interaction assist level: Interacts appropriately 90% of the time - Needs monitoring or encouragement for participation or interaction.  Function - Problem Solving Problem solving assist level: Solves basic problems with no assist  Function - Memory Memory assist level: Recognizes or recalls 75 - 89% of the time/requires cueing 10 - 24% of the time Medical Problem List and Plan: 1. Decreased functional mobility with aphasiasecondary to multifocal intraparenchymal hemorrhages -CIR PT, OT, SLP CIR level, patient has some fatigue with therapy, but able to tolerate 2. DVT Prophylaxis/Anticoagulation: SCDs.  3. Pain Management/trigeminal neuralgia: Resume Neurontin 100-200 mg at bedtime as needed- no change at present 4. Mood/Alzheimer's dementia: [place] 5. Neuropsych: This patient iscapable of making decisions on herown behalf. 6. Skin/Wound Care: Routine skin checks, foam prophyllactic sacral dressing 7. Fluids/Electrolytes/Nutrition: Routine I&O's with follow-up chemistries, meal intake 25% for dinner, alb low start prostat 8.Hypertension.  Norvasc 10 mg daily, Lasix 40 mg daily, some lability, continue to monitor Vitals:   10/16/16 1406 10/17/16 0445  BP: (!) 101/43 (!) 144/49  Pulse: 79 73  Resp: 18 18  Temp: 98.1 F (36.7 C) 99.1 F (37.3 C)    9Hyperlipidemia. Pravachol 10.  Urinary incont - had some problems PTA no dysuria, no fever may be CVA related spastic bladder , rec toileting program 11. Constipation. We'll start senna S 1 by mouth twice a day . Sorbitol tonight. If no BM LOS (Days) 3 A FACE TO FACE EVALUATION WAS PERFORMED  KIRSTEINS,ANDREW E 10/17/2016, 8:37 AM

## 2016-10-17 NOTE — Progress Notes (Signed)
Physical Therapy Session Note  Patient Details  Name: Julia Floyd MRN: [id number] Date of Birth: 24-Mar-1931  Today's Date: 10/17/2016 PT Individual Time: 0905-1001 PT Individual Time Calculation (min): 56 min   Short Term Goals: Week 1:  PT Short Term Goal 1 (Week 1): Pt will perform bed mobility with min assist from PT PT Short Term Goal 2 (Week 1): Pt will transfer to and from [facility] with min assist from PT PT Short Term Goal 3 (Week 1): Pt will propell WC 155f with min assist  PT Short Term Goal 4 (Week 1): Pt will perform sit<>stand with min assist and BUE support.   Skilled Therapeutic Interventions/Progress Updates:   Pt received supine in bed and agreeable to PT. Supine>sit transfer with min  Assist, heavy use of bed features and moderate cues for proper technique and sequencing.   PT assisted pt to don pants and shirt sitting EOB with lateral leatn L and R to pants managmenet.   Squat pivot transfer with mod-max assist from PT and moderate cues for sequencing.   WC mobility instructed by PT [id number] supervision assist using BUE propulsion and improved use of LUE to maintain straight path.   SB transfer training to and from mat table mod assist progressing to min assist and moderate cues for safety, sequecning and proper anterior weight shift.  Car transfer with SB and min-mod assist from PT. With moderate cues for proper UE placement and improved anterior weight shift to improve WB through BLE.   Standing tolerance and postural re-training in standing fram x 4 mintues with alternating forward reach with BUE to target. Moderate cues for improved trunk extensor activation when reaching with the RUE.   [facility] returned too room and [facility] reach and all needs met.           Therapy Documentation Precautions:  Precautions Precautions: Fall Precaution Comments: General weakness L>R; dementia Restrictions Weight Bearing Restrictions:  No   Pain: Pain Assessment Pain Assessment: No/denies pain  See Function Navigator for Current Functional Status.   Therapy/Group: Individual Therapy  AuLorie Phenix/20/2018, 10:30 AM

## 2016-10-17 NOTE — Progress Notes (Signed)
Occupational Therapy Session Note  Patient Details  Name: Julia Floyd MRN: [id number] Date of Birth: 1931/02/05  Today's Date: 10/17/2016 OT Individual Time: 1100-1200 OT Individual Time Calculation (min): 60 min    Short Term Goals: Week 1:  OT Short Term Goal 1 (Week 1): Pt will complete toilet transfer with mod A OT Short Term Goal 2 (Week 1): Pt will don shirt with min A OT Short Term Goal 3 (Week 1): Pt will dress LB with mod A OT Short Term Goal 4 (Week 1): Pt will stand with mod A in order for caregiver to provide care  Skilled Therapeutic Interventions/Progress Updates:    Pt seen for OT session focusing on functional transfers and ADL re-training. Pt sitting up in w/c upon arrival, agreeable to tx session. She declined bathing/dressing this session.  In bathroom, completed squat pivot transfer to drop arm BSC placed over toilet. With use of grab bar, pt completed transfer with mod A. Max assist required for standing to complete clothing management and for hygiene following Bm, completed with total A, pt unable to achieve erect posture. Rest breaks required throughout. She returned to sink and completed hand hygiene with assist for set-up. In therapy gym, completed w/c <> EOM with use of sliding board. Following assist for placement and VCs for head/hip relationship and technique, pt able to complete transfer with guarding assist. She sat EOM with supervision for rest break.  Pt returned to room at end of session, left sitting up in w/c with all needs in reach awaiting lunch. Discussed DME and home layout throughout session in prep for d/c. Will need to discuss with pt's family as she is unrelible historian.  Therapy Documentation Precautions:  Precautions Precautions: Fall Precaution Comments: General weakness L>R; dementia Restrictions Weight Bearing Restrictions: No Pain:   No/ denies pain  See Function Navigator for Current Functional Status.   Therapy/Group:  Individual Therapy  Lewis, Maximo Spratling C 10/17/2016, 7:09 AM

## 2016-10-17 NOTE — IPOC Note (Signed)
Overall Plan of Care [facility]) Patient Details Name: Julia Floyd MRN: [id number] DOB: 07/18/1930  Admitting Diagnosis: CVA  Hospital Problems: Active Problems:   Intraparenchymal hemorrhage of brain (HCC)   Gait disturbance, post-stroke     Functional Problem List: Nursing Bladder, Motor, Perception, Bowel, Skin Integrity, Medication Management, Endurance, Safety, Sensory, Nutrition  PT Balance, Endurance, Motor, Nutrition, Sensory, Skin Integrity, Safety  OT    SLP    TR         Basic ADL's: OT Grooming, Bathing, Dressing, Toileting     Advanced  ADL's: OT       Transfers: PT Bed Mobility, Bed to Chair, Car, [name], Floor  OT Toilet     Locomotion: PT Ambulation, Emergency planning/management officer, Stairs     Additional Impairments: OT None  SLP None      TR      Anticipated Outcomes Item Anticipated Outcome  Self Feeding Set-up  Swallowing      Basic self-care  Min   Toileting  Min A   Bathroom Transfers Min A  Bowel/Bladder  Pt will manage bowel and bladder with mod assist from staff and family at discharge   Transfers  Supervision assist   Locomotion  Mod I at [facility] level   Communication     Cognition     Pain  Pt will manage pain at 4 or less on a scale of 0-10.   Safety/Judgment  Pt will remain free of falls and injury while in rehab with mod assist/cues.    Therapy Plan: PT Intensity: Minimum of 1-2 x/day ,45 to 90 minutes PT Frequency: 5 out of 7 days PT Duration Estimated Length of Stay: 14-17days  OT Intensity: Minimum of 1-2 x/day, 45 to 90 minutes OT Frequency: 5 out of 7 days OT Duration/Estimated Length of Stay: 2 weeks         Team Interventions: Nursing Interventions Patient/Family Education, Bladder Management, Bowel Management, Medication Management, Pain Management, Disease Management/Prevention, Skin Care/Wound Management, Cognitive Remediation/Compensation, Discharge Planning  PT interventions Ambulation/gait training,  Balance/vestibular training, Cognitive remediation/compensation, Community reintegration, Discharge planning, Disease management/prevention, DME/adaptive equipment instruction, Functional mobility training, Neuromuscular re-education, Pain management, Functional electrical stimulation, Patient/family education, Psychosocial support, Skin care/wound management, Splinting/orthotics, Stair training, Therapeutic Activities, Therapeutic Exercise, UE/LE Strength taining/ROM, UE/LE Coordination activities, Visual/perceptual remediation/compensation, Wheelchair propulsion/positioning  OT Interventions Training and development officer, Discharge planning, DME/adaptive equipment instruction, Disease mangement/prevention, Functional electrical stimulation, Functional mobility training, Neuromuscular re-education, Pain management, Patient/family education, Psychosocial support, Self Care/advanced ADL retraining, Therapeutic Activities, Therapeutic Exercise, UE/LE Strength taining/ROM, UE/LE Coordination activities, Wheelchair propulsion/positioning  SLP Interventions    TR Interventions    SW/CM Interventions Discharge Planning, Psychosocial Support, Patient/Family Education    Team Discharge Planning: Destination: PT-Home ,OT- Home , SLP-Home Projected Follow-up: PT-Home health PT, OT-  24 hour supervision/assistance, Home health OT, SLP-24 hour supervision/assistance, None Projected Equipment Needs: PT-Rolling walker with 5" wheels, To be determined, OT- To be determined, SLP-None recommended by SLP Equipment Details: PT- , OT-  Patient/family involved in discharge planning: PT- Patient,  OT-Patient, SLP-Patient, Family member/caregiver  MD ELOS: 14-18d Medical Rehab Prognosis:  Excellent Assessment:  83 y.o.right handed femalewith history of remote stroke, Alzheimer's dementia, trigeminal neuralgia, diabetes mellitus and hypertension. Per chart review patient lives with family. One level home with ramped  entrance. She has a home health aide 2 hours daily to assist with ADLs. Patient performs sit to stand and stand pivot transfers modified independent prior to admission. Presented 10/07/2016 with altered mental status and  inability to speak. Cranial CT scan negative for acute changes. Patient did receive TPA. CT angiogram head and neck showed no large or medium vessel occlusion identified. Left subclavian artery with 50% stenosis. Follow-up CTI showed interval development of multifocal intraparenchymal hemorrhages no significant mass effect. MRI showed no new hemorrhage is present there was some surrounding vasogenic edema with small local mass effect particularly in the left cerebellum. Echocardiogram with ejection fraction 88% grade 1 diastolic dysfunction. Neurology follow-up with close monitoring. No current anti-thrombotic due to Julia Floyd. Dysphagia #3 thin liquid diet   Now requiring 24/7 Rehab RN,MD, as well as CIR level PT, OT and SLP.  Treatment team will focus on ADLs and mobility with goals set at [facility] See Team Conference Notes for weekly updates to the plan of care

## 2016-10-18 ENCOUNTER — Inpatient Hospital Stay (HOSPITAL_COMMUNITY): Payer: Medicare Other | Admitting: Occupational Therapy

## 2016-10-18 ENCOUNTER — Inpatient Hospital Stay (HOSPITAL_COMMUNITY): Payer: Medicare Other | Admitting: Physical Therapy

## 2016-10-18 ENCOUNTER — Inpatient Hospital Stay (HOSPITAL_COMMUNITY): Payer: Medicare Other

## 2016-10-18 NOTE — Plan of Care (Signed)
Problem: RH BLADDER ELIMINATION Goal: RH STG MANAGE BLADDER WITH ASSISTANCE STG Manage Bladder With mod Assistance  Outcome: Not Progressing Incontinent

## 2016-10-18 NOTE — Progress Notes (Signed)
Occupational Therapy Session Note  Patient Details  Name: Julia Floyd MRN: [id number] Date of Birth: 1930-11-26  Today's Date: 10/18/2016 OT Individual Time: 0737-1062 and 6948-5462 OT Individual Time Calculation (min): 63 min and 62 min   Short Term Goals: Week 1:  OT Short Term Goal 1 (Week 1): Pt will complete toilet transfer with mod A OT Short Term Goal 2 (Week 1): Pt will don shirt with min A OT Short Term Goal 3 (Week 1): Pt will dress LB with mod A OT Short Term Goal 4 (Week 1): Pt will stand with mod A in order for caregiver to provide care    Skilled Therapeutic Interventions/Progress Updates:    Tx focus on standing balance, ADL retraining, and endurance.   Pt greeted in w/c, agreeable to complete self care this AM. Pt selecting clothing items with increased time and assist for organization. She then completed bathing w/c level at sink at sit<stand level. Pt able to wash LEs down to ankles, required A for thoroughly washing feet as well as pericare while standing with bilateral UE support (pt able to complete front pericare herself while standing). Max A sit<stand with multimodal cues for forward weight shifting and proper hand placement during power up. Pt unable to achieve upright standing position due to forward flexed posture. Max A for donning LB garments, Total A for footwear, and Min A for donning overhead shirt. Pt exhibiting bilateral FMC deficits, dropping grooming items x2 with assist required for retrieving off of floor. At end of tx, pt left with all needs, blanket draped over shoulders for comfort.   During tx, pt speaking fondly of flower garden, and missing attending to it. Intend to explore therapy activities that promote occupational enrichment during PM session.   2nd Session 1:1 tx (62 min) Tx focus on endurance, UB strengthening, and occupational enrichment through engagement in meaningful leisure occupation.   Pt greeted in w/c with granddaughter  present, agreeable to go outdoors. Once outdoors, pt self propelling over uneven surfaces and changing directions with Min-Mod A (for inclines). Pt watering plants with small watering can while seated, identifying flowers/plants and reminiscing about her flower garden at home. Pt is an avid gardener. Pt attending to task of scanning around environment to select her favorite flowers for placing in the room. OT picking selected flowers with pts affect visibly brightening while smelling them in container. Pt then engaged in [facility] activity with use of button box. Pt fastening/unfastening 5 buttons with Min A and increased time. Unable to engage in conversation with OT while concentrating on task due to dual processing demands. After activity, pt reporting her hands felt "numb." She was then escorted back to room. Flowers placed on [employer]. Pt was left with all needs and granddaughter present at time of departure.   Consulted RN in regards to having grounds pass order for pt and family.   Therapy Documentation Precautions:  Precautions Precautions: Fall Precaution Comments: General weakness L>R; dementia Restrictions Weight Bearing Restrictions: No  Pain: Pain Assessment Pain Assessment: No/denies pain ADL:       See Function Navigator for Current Functional Status.   Therapy/Group: Individual Therapy  Orange Hilligoss A Riah Kehoe 10/18/2016, 12:37 PM

## 2016-10-18 NOTE — Progress Notes (Signed)
Subjective/Complaints:   ROS- denies CP/SOB, - N/V/D  Objective: Vital Signs: Blood pressure (!) 140/46, pulse 73, temperature 99.5 F (37.5 C), temperature source Oral, resp. rate 18, height 5\' 6"  (1.676 m), weight 78.4 kg (172 lb 14.9 oz), SpO2 100 %. No results found. No results found for this or any previous visit (from the past 72 hour(s)).   HEENT: normal Cardio: RRR and no murmur Resp: CTA B/L and unlabored GI: BS positive and NT, ND Extremity:  No Edema Skin:   Intact, pain over the sacrum but no breakdown Neuro: Alert/Oriented, Abnormal Sensory LT diminished on left side and Abnormal Motor 4/5 BUE, 4- BLE, 3- LLE, min dysmetria L FNF Musc/Skel:  Other no pain with UE or LE ROM Gen NAD   Assessment/Plan: 1. 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 Physiatrist is providing close team supervision and 24 hour management of active medical problems listed below. Physiatrist and rehab team continue to assess barriers to discharge/monitor patient progress toward functional and medical goals. FIM: Function - Bathing Position: Shower Body parts bathed by patient: Right arm, Left arm, Chest, Abdomen, Left upper leg, Right upper leg, Buttocks, Front perineal area Body parts bathed by helper: Right lower leg, Left lower leg, Back  Function- Upper Body Dressing/Undressing What is the patient wearing?: Pull over shirt/dress Pull over shirt/dress - Perfomed by patient: Thread/unthread right sleeve, Thread/unthread left sleeve, Put head through opening Pull over shirt/dress - Perfomed by helper: Pull shirt over trunk Function - Lower Body Dressing/Undressing What is the patient wearing?: Non-skid slipper socks, Pants Position: Wheelchair/chair at sink Pants- Performed by helper: Thread/unthread right pants leg, Thread/unthread left pants leg, Pull pants up/down Non-skid  slipper socks- Performed by helper: Don/doff right sock, Don/doff left sock Assist for footwear: Dependant Assist for lower body dressing:  (Standing in STEDY)  Function - Toileting Toileting steps completed by helper: Adjust clothing prior to toileting, Performs perineal hygiene, Adjust clothing after toileting Toileting Assistive Devices: Grab bar or rail Assist level: Two helpers  Function - Air cabin crew transfer assistive device: Grab bar, Elevated toilet seat/BSC over toilet Mechanical lift: Stedy Assist level to toilet: Moderate assist (Pt 50 - 74%/lift or lower) Assist level from toilet: Moderate assist (Pt 50 - 74%/lift or lower)  Function - Chair/bed transfer Chair/bed transfer method: Squat pivot Chair/bed transfer assist level: Touching or steadying assistance (Pt > 75%) Chair/bed transfer assistive device: Armrests, Sliding board Chair/bed transfer details: Verbal cues for technique, Verbal cues for sequencing  Function - Locomotion: Wheelchair Type: Manual Max wheelchair distance: 133ft Assist Level: Supervision or verbal cues Assist Level: Supervision or verbal cues Wheel 150 feet activity did not occur: Safety/medical concerns Function - Locomotion: Ambulation Assistive device: Parallel bars Max distance: 4 Assist level: Maximal assist (Pt 25 - 49%) Walk 10 feet activity did not occur: Safety/medical concerns Walk 50 feet with 2 turns activity did not occur: Safety/medical concerns Walk 150 feet activity did not occur: Safety/medical concerns Walk 10 feet on uneven surfaces activity did not occur: Safety/medical concerns  Function - Comprehension Comprehension: Auditory Comprehension assist level: Understands basic 90% of the time/cues < 10% of the time  Function - Expression Expression: Verbal Expression assist level: Expresses complex ideas: With extra time/assistive device  Function - Social Interaction Social Interaction assist level:  Interacts appropriately 90% of the time - Needs monitoring or encouragement for participation or interaction.  Function - Problem  Solving Problem solving assist level: Solves basic problems with no assist  Function - Memory Memory assist level: Recognizes or recalls 75 - 89% of the time/requires cueing 10 - 24% of the time Medical Problem List and Plan: 1. Decreased functional mobility with aphasiasecondary to multifocal intraparenchymal hemorrhages -CIR PT, OT, SLP CIR level,2. DVT Prophylaxis/Anticoagulation: SCDs.  3. Pain Management/trigeminal neuralgia: Resume Neurontin 100-200 mg at bedtime as needed- no change at present 4. Mood/Alzheimer's dementia: Provide emotional support.[place] with family 5. Neuropsych: This patient iscapable of making decisions on herown behalf. 6. Skin/Wound Care: Routine skin checks, foam prophyllactic sacral dressing 7. Fluids/Electrolytes/Nutrition: Routine I&O's with follow-up chemistries, meal intake 25% for dinner, alb low start prostat 8.Hypertension. Norvasc 10 mg daily, Lasix 40 mg daily, some lability, continue to monitor Vitals:   10/17/16 1300 10/18/16 0415  BP: (!) 130/56 (!) 140/46  Pulse: 78 73  Resp: 18 18  Temp: 98.3 F (36.8 C) 99.5 F (37.5 C)    9Hyperlipidemia. Pravachol 10.  Urinary incont - had some problems PTA no dysuria, no fever may be CVA related spastic bladder , rec toileting program 11. Constipation. Senna twice a day, BM on 10/18/2016 LOS (Days) 4 A FACE TO FACE EVALUATION WAS PERFORMED  Dewayne Jurek E 10/18/2016, 11:26 AM

## 2016-10-18 NOTE — Progress Notes (Signed)
Physical Therapy Session Note  Patient Details  Name: Julia Floyd MRN: [id number] Date of Birth: March 13, 1931  Today's Date: 10/18/2016 PT Individual Time: 0802-0845 PT Individual Time Calculation (min): 43 min   Short Term Goals: Week 1:  PT Short Term Goal 1 (Week 1): Pt will perform bed mobility with min assist from PT PT Short Term Goal 2 (Week 1): Pt will transfer to and from [facility] with min assist from PT PT Short Term Goal 3 (Week 1): Pt will propell WC 182f with min assist  PT Short Term Goal 4 (Week 1): Pt will perform sit<>stand with min assist and BUE support.   Skilled Therapeutic Interventions/Progress Updates:   Pt received supine in bed and agreeable to PT. Supine>sit transfer with min assist, no use of bed features, and moderate cues for sequencing to improve proper use of BUE.   PT instructed pt in lower body dressing task EOB with lateral and forward leans with PT to assist with clothing management. Supervision assist from PT for safety with lateral leans.   Modified squat pivot transfer with min assist from PT and BUE support on bed and arm rest of WC. Moderate verbal and visual cues for improved anterior weight shift and use of BLE to pivot towards chair  WC mobility instructed pt PT x 104fwith supervision assist and only min cues for proper use of BUE to maintain straight trajectory.   Sit<>stand transfer training in parallel bars followed by standing tolerance x 1 minute with min assist overall. Moderate cues from PT for improved postural control and trunk/hip extension.   Patient returned too room and [facility] reach and all needs met.           Therapy Documentation Precautions:  Precautions Precautions: Fall Precaution Comments: General weakness L>R; dementia Restrictions Weight Bearing Restrictions: No Pain: 0/10   See Function Navigator for Current Functional Status.   Therapy/Group: Individual Therapy  AuLorie Phenix/21/2018, 8:47 AM

## 2016-10-18 NOTE — Progress Notes (Signed)
Physical Therapy Session Note  Patient Details  Name: Julia Floyd MRN: [id number] Date of Birth: 01-09-1931  Today's Date: 10/18/2016 PT Individual Time:0 min     Short Term Goals: Week 1:  PT Short Term Goal 1 (Week 1): Pt will perform bed mobility with min assist from PT PT Short Term Goal 2 (Week 1): Pt will transfer to and from [facility] with min assist from PT PT Short Term Goal 3 (Week 1): Pt will propell WC 115ft with min assist  PT Short Term Goal 4 (Week 1): Pt will perform sit<>stand with min assist and BUE support.   Skilled Therapeutic Interventions/Progress Updates:    Pt had just finished with OT upon PT arrival. Pt stated that she was too tired to participate in 30 more minutes of therapy tx with PT and requested to stay in her w/c in the room so she could spend time with her granddaughter who was visiting. PT suggested seated therex and pt denied.   Therapy Documentation Precautions:  Precautions Precautions: Fall Precaution Comments: General weakness L>R; dementia Restrictions Weight Bearing Restrictions: No General: PT Amount of Missed Time (min): 30 Minutes PT Missed Treatment Reason: Patient unwilling to participate;Patient fatigue   See Function Navigator for Current Functional Status.   Therapy/Group: Individual Therapy  Netta Corrigan, PT, DPT 10/18/2016, 3:16 PM

## 2016-10-20 ENCOUNTER — Inpatient Hospital Stay (HOSPITAL_COMMUNITY): Payer: Medicare Other | Admitting: Occupational Therapy

## 2016-10-20 ENCOUNTER — Inpatient Hospital Stay (HOSPITAL_COMMUNITY): Payer: Medicare Other | Admitting: Physical Therapy

## 2016-10-20 ENCOUNTER — Inpatient Hospital Stay (HOSPITAL_COMMUNITY): Payer: Medicare Other

## 2016-10-20 DIAGNOSIS — I10 Essential (primary) hypertension: Secondary | ICD-10-CM

## 2016-10-20 NOTE — Progress Notes (Signed)
Physical Therapy Session Note  Patient Details  Name: Julia Floyd MRN: [id number] Date of Birth: 25-Feb-1931  Today's Date: 10/20/2016 PT Individual Time: 1611-1705 PT Individual Time Calculation (min): 54 min   Short Term Goals: Week 1:  PT Short Term Goal 1 (Week 1): Pt will perform bed mobility with min assist from PT PT Short Term Goal 2 (Week 1): Pt will transfer to and from [facility] with min assist from PT PT Short Term Goal 3 (Week 1): Pt will propell WC 174ft with min assist  PT Short Term Goal 4 (Week 1): Pt will perform sit<>stand with min assist and BUE support.   Skilled Therapeutic Interventions/Progress Updates:    Pt seated in w/c upon PT arrival, states "they worked me too hard today, I don't think I can do much." Pt agreeable to performing exercises from the chair. Pt propelled w/c 2 x 50 ft to the gym using B UEs with supervision, increased time to complete and a rest break on the way. Pt used the UE arm bike 1 x 4 minutes and 1 x 3 minutes to work on activity tolerance and UE range of motion. Pt used the kinetron x 8 minutes working on LE coordination, strengthening and endurance. Using the kinetron, pt with difficulty extending L LE, verbal cues to push L LE all the way to the floor. Pt propelled w/c back to room x 50 ft with supervision and requested to be pushed the rest of the way. Pts dinner in room upon arrival. Pt set up in w/c with dinner and needs in reach.  Pt denies pain this session.   Therapy Documentation Precautions:  Precautions Precautions: Fall Precaution Comments: General weakness L>R; dementia Restrictions Weight Bearing Restrictions: No   See Function Navigator for Current Functional Status.   Therapy/Group: Individual Therapy  Netta Corrigan, PT, DPT 10/20/2016, 4:49 PM

## 2016-10-20 NOTE — Progress Notes (Addendum)
Physical Therapy Session Note  Patient Details  Name: Julia Floyd MRN: [id number] Date of Birth: 1930/12/02  Today's Date: 10/20/2016 PT Individual Time: 1145-1210 and 1430-1540 PT Individual Time Calculation (min): 25 min and 70 min  Short Term Goals: Week 1:  PT Short Term Goal 1 (Week 1): Pt will perform bed mobility with min assist from PT PT Short Term Goal 2 (Week 1): Pt will transfer to and from [facility] with min assist from PT PT Short Term Goal 3 (Week 1): Pt will propell WC 112f with min assist  PT Short Term Goal 4 (Week 1): Pt will perform sit<>stand with min assist and BUE support.   Skilled Therapeutic Interventions/Progress Updates: Tx1: Pt presented in w/c agreeable to therapy. Performed w/c propulsion 1031fwith minA and cues for increased use of LUE to maintain straight path. Performed sit to stand at wall rail x 3, requiring modA and max multimodal cues for posture, increasing knee extension and pulling hips forward and chest back. Pt maintained standing tolerance 30-45 sec for all three trials with therapeutic breaks between each. Pt propelled x 2061fith use of only BLE for LE strengthening and reciprocal movement. Pt returned to room via total assist and remained in w/c with call bell within reach and needs met.   Tx2: Pt presented  In bed agreeable to therapy. Performed supine to sit minA cues for sequencing and hand placement. MinA for LE placement and truncal support. Performed squat pivot transfer bed to w/c with modA. Use of theraband on w/c rail to facilitate hand placement on L wheel. Pt propelled with use of BUE x [id number] improved technique from previous session. Transported to rehab gym and performed Kinetron 90 cm/sec 2 trials x 30 sec and 1 trial by 1 minute for reciprocal motion and LE strengthening.  Performed squat pivot transfer w/c to mat with cues for increased anterior wt shift and hand placement. Performed seated balance reaching for truncal activation.  Pt able to reach high/low/cross midline with encouragement however no LOB.  Pt able to perform with minA. Performed sit to supine with modA, performed LE therex for NMR. Performed hip add in hooklying, SAQ, modified bridges (pt did not have enough glute strength to clear buttock from mat) and HS pulls using yellow physioball.  Performed supine to sit min/modA from flat surface with pt coming to complete sidelying with modA for LE placement and minA for truncal support with pt pushing up with RUE. Performed squat pivot transfer to R with modA and pt propelled approx 50 ft then total assist remaining distance to room. Pt agreeable to remain in w/c at end of session to await next therapy session in 30 min.       Therapy Documentation Precautions:  Precautions Precautions: Fall Precaution Comments: General weakness L>R; dementia Restrictions Weight Bearing Restrictions: No  See Function Navigator for Current Functional Status.   Therapy/Group: Individual Therapy  Briant Angelillo  Torsha Lemus, PTA  Briana Farner, PTA  10/20/2016, 1:27 PM

## 2016-10-20 NOTE — Progress Notes (Addendum)
Occupational Therapy Session Note  Patient Details  Name: Julia Floyd MRN: [id number] Date of Birth: Mar 03, 1931  Today's Date: 10/20/2016 OT Individual Time: 2505-3976 OT Individual Time Calculation (min): 60 min    Short Term Goals: Week 1:  OT Short Term Goal 1 (Week 1): Pt will complete toilet transfer with mod A OT Short Term Goal 2 (Week 1): Pt will don shirt with min A OT Short Term Goal 3 (Week 1): Pt will dress LB with mod A OT Short Term Goal 4 (Week 1): Pt will stand with mod A in order for caregiver to provide care  Skilled Therapeutic Interventions/Progress Updates:    Pt seen for OT ADL bathing/dressing session. Pt sitting-up in bed upon arrival finishing breakfast and agreeable to tx session. She required assit for powering up at trunk usiing hospital bed functions to come sitting EOB. Max A squat pivot transfer completed into w/c. Bathing/dressing completed from w/c level at sink with set--up assist for UB bathing/ dressing. Pt demonstrated decreased control with use of L hand and verbalized it feeling weaker since CVA. She stood at sink with max A, using B UEs to pull up on sink ledge, total A for buttock hygiene and washing feet as pt unable to reach to remove shoes/socks and wash feet. She required total A for LB dressing, requiring multiple sit <> stands in order to complete task as she was unable to tolerate standing >~10 seconds each trial. Max A to stand each trial with pt unable to achieve full upright posture despite multimodal cuing.  Grooming tasks completed with set-up from w/c level.  Pt left seated in w/c at end of session, all needs in reach.   Therapy Documentation Precautions:  Precautions Precautions: Fall Precaution Comments: General weakness L>R; dementia Restrictions Weight Bearing Restrictions: No Pain:   No/ denies pain  See Function Navigator for Current Functional Status.   Therapy/Group: Individual Therapy  Lewis, Juston Goheen  C 10/20/2016, 7:14 AM

## 2016-10-20 NOTE — Progress Notes (Signed)
Subjective/Complaints: Patient is working with occupational therapy on morning ADL program. No complaints today. Slept well.  ROS- denies CP/SOB, - N/V/D  Objective: Vital Signs: Blood pressure (!) 126/45, pulse 73, temperature 98.5 F (36.9 C), temperature source Oral, resp. rate 18, height 5\' 6"  (1.676 m), weight 78.4 kg (172 lb 14.9 oz), SpO2 100 %. No results found. No results found for this or any previous visit (from the past 72 hour(s)).   HEENT: normal Cardio: RRR and no murmur Resp: CTA B/L and unlabored GI: BS positive and NT, ND Extremity:  No Edema Skin:   Intact, pain over the sacrum but no breakdown Neuro: Alert/Oriented, Abnormal Sensory LT diminished on left side and Abnormal Motor 4/5 BUE, 4- BLE, 3- LLE, min dysmetria L FNF Musc/Skel:  Other no pain with UE or LE ROM Gen NAD   Assessment/Plan: 1. 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 Physiatrist is providing close team supervision and 24 hour management of active medical problems listed below. Physiatrist and rehab team continue to assess barriers to discharge/monitor patient progress toward functional and medical goals. FIM: Function - Bathing Position: Wheelchair/chair at sink Body parts bathed by patient: Right arm, Left arm, Chest, Abdomen, Left upper leg, Right upper leg, Buttocks, Front perineal area Body parts bathed by helper: Left lower leg, Back, Right lower leg  Function- Upper Body Dressing/Undressing What is the patient wearing?: Pull over shirt/dress Pull over shirt/dress - Perfomed by patient: Thread/unthread right sleeve, Thread/unthread left sleeve, Put head through opening Pull over shirt/dress - Perfomed by helper: Pull shirt over trunk Assist Level: Touching or steadying assistance(Pt > 75%) Function - Lower Body Dressing/Undressing What is the patient wearing?: Non-skid  slipper socks, Pants, Shoes Position: Wheelchair/chair at sink Pants- Performed by helper: Thread/unthread right pants leg, Thread/unthread left pants leg, Pull pants up/down Non-skid slipper socks- Performed by helper: Don/doff right sock, Don/doff left sock Shoes - Performed by helper: Don/doff right shoe, Don/doff left shoe, Fasten right, Fasten left Assist for footwear: Dependant Assist for lower body dressing:  (Standing in STEDY)  Function - Toileting Toileting steps completed by helper: Adjust clothing prior to toileting, Performs perineal hygiene, Adjust clothing after toileting Toileting Assistive Devices: Grab bar or rail Assist level: Two helpers  Function - Air cabin crew transfer assistive device: Grab bar, Elevated toilet seat/BSC over toilet Mechanical lift: Stedy Assist level to toilet: Moderate assist (Pt 50 - 74%/lift or lower) Assist level from toilet: Moderate assist (Pt 50 - 74%/lift or lower)  Function - Chair/bed transfer Chair/bed transfer method: Squat pivot Chair/bed transfer assist level: Touching or steadying assistance (Pt > 75%) Chair/bed transfer assistive device: Mechanical lift Mechanical lift: Stedy Chair/bed transfer details: Verbal cues for technique, Verbal cues for sequencing  Function - Locomotion: Wheelchair Type: Manual Max wheelchair distance: 129ft Assist Level: Supervision or verbal cues Assist Level: Supervision or verbal cues Wheel 150 feet activity did not occur: Safety/medical concerns Function - Locomotion: Ambulation Assistive device: Parallel bars Max distance: 4 Assist level: Maximal assist (Pt 25 - 49%) Walk 10 feet activity did not occur: Safety/medical concerns Walk 50 feet with 2 turns activity did not occur: Safety/medical concerns Walk 150 feet activity did not occur: Safety/medical concerns Walk 10 feet on uneven surfaces activity did not occur: Safety/medical concerns  Function -  Comprehension Comprehension: Auditory Comprehension assist level: Understands basic 90% of the time/cues < 10% of the time  Function -  Expression Expression: Verbal Expression assist level: Expresses basic needs/ideas: With extra time/assistive device  Function - Social Interaction Social Interaction assist level: Interacts appropriately 90% of the time - Needs monitoring or encouragement for participation or interaction.  Function - Problem Solving Problem solving assist level: Solves basic problems with no assist  Function - Memory Memory assist level: Recognizes or recalls 75 - 89% of the time/requires cueing 10 - 24% of the time Medical Problem List and Plan: 1. Decreased functional mobility with aphasiasecondary to multifocal intraparenchymal hemorrhages -CIR PT, OT, SLP CIR level,2. DVT Prophylaxis/Anticoagulation: SCDs.  3. Pain Management/trigeminal neuralgia: Resume Neurontin 100-200 mg at bedtime as needed- no change at present, no facial pain 4. Mood/Alzheimer's dementia: Provide emotional support.[place] with family 5. Neuropsych: This patient iscapable of making decisions on herown behalf. 6. Skin/Wound Care: Routine skin checks, foam prophyllactic sacral dressing 7. Fluids/Electrolytes/Nutrition: Routine I&O's with follow-up chemistries, meal intake 25% for dinner, alb low start prostat 8.Hypertension. Norvasc 10 mg daily, Lasix 40 mg daily, some lability, continue to monitor, no changes to medication 10/20/2016 Vitals:   10/19/16 1351 10/20/16 0439  BP: (!) 145/42 (!) 126/45  Pulse: 73 73  Resp: 18 18  Temp: 98 F (36.7 C) 98.5 F (36.9 C)    9Hyperlipidemia. Pravachol 10.  Urinary incont - had some problems PTA no dysuria, no fever may be CVA related spastic bladder , rec toileting program 11. Constipation. Senna twice a day, No diarrhea LOS (Days) 6 A FACE TO FACE EVALUATION WAS PERFORMED  Milton Sagona E 10/20/2016, 8:54 AM

## 2016-10-21 ENCOUNTER — Inpatient Hospital Stay (HOSPITAL_COMMUNITY): Payer: Medicare Other | Admitting: Occupational Therapy

## 2016-10-21 ENCOUNTER — Inpatient Hospital Stay (HOSPITAL_COMMUNITY): Payer: Medicare Other | Admitting: Physical Therapy

## 2016-10-21 NOTE — Progress Notes (Signed)
Subjective/Complaints: No issues overnite  ROS- denies CP/SOB, - N/V/D  Objective: Vital Signs: Blood pressure (!) 146/56, pulse 66, temperature 98.5 F (36.9 C), temperature source Oral, resp. rate 18, height 5\' 6"  (1.676 m), weight 78.4 kg (172 lb 14.9 oz), SpO2 100 %. No results found. No results found for this or any previous visit (from the past 72 hour(s)).   HEENT: normal Cardio: RRR and no murmur Resp: CTA B/L and unlabored GI: BS positive and NT, ND Extremity:  No Edema Skin:   Intact, pain over the sacrum but no breakdown Neuro: Alert/Oriented, Abnormal Sensory LT diminished on left side and Abnormal Motor 4/5 BUE, 4- BLE, 3- LLE, min dysmetria L FNF Musc/Skel:  Other no pain with UE or LE ROM Gen NAD   Assessment/Plan: 1. 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 Physiatrist is providing close team supervision and 24 hour management of active medical problems listed below. Physiatrist and rehab team continue to assess barriers to discharge/monitor patient progress toward functional and medical goals. FIM: Function - Bathing Position: Wheelchair/chair at sink Body parts bathed by patient: Right arm, Left arm, Chest, Abdomen, Left upper leg, Right upper leg, Front perineal area Body parts bathed by helper: Left lower leg, Back, Right lower leg, Buttocks  Function- Upper Body Dressing/Undressing What is the patient wearing?: Pull over shirt/dress Pull over shirt/dress - Perfomed by patient: Thread/unthread right sleeve, Thread/unthread left sleeve, Put head through opening Pull over shirt/dress - Perfomed by helper: Pull shirt over trunk Assist Level: Touching or steadying assistance(Pt > 75%) Function - Lower Body Dressing/Undressing What is the patient wearing?: Non-skid slipper socks, Pants, Shoes Position: Wheelchair/chair at sink Pants- Performed by  helper: Thread/unthread right pants leg, Thread/unthread left pants leg, Pull pants up/down Non-skid slipper socks- Performed by helper: Don/doff right sock, Don/doff left sock Shoes - Performed by helper: Don/doff right shoe, Don/doff left shoe, Fasten right, Fasten left Assist for footwear: Maximal assist Assist for lower body dressing:  (Standing in STEDY)  Function - Toileting Toileting steps completed by helper: Performs perineal hygiene, Adjust clothing prior to toileting, Adjust clothing after toileting Toileting Assistive Devices: Grab bar or rail Assist level: Two helpers  Function - Air cabin crew transfer assistive device: Grab bar, Elevated toilet seat/BSC over toilet Mechanical lift: Stedy Assist level to toilet: Moderate assist (Pt 50 - 74%/lift or lower) Assist level from toilet: Moderate assist (Pt 50 - 74%/lift or lower)  Function - Chair/bed transfer Chair/bed transfer method: Squat pivot Chair/bed transfer assist level: Touching or steadying assistance (Pt > 75%) Chair/bed transfer assistive device: Mechanical lift Mechanical lift: Stedy Chair/bed transfer details: Verbal cues for technique, Verbal cues for sequencing  Function - Locomotion: Wheelchair Type: Manual Max wheelchair distance: 50 ft Assist Level: Supervision or verbal cues Assist Level: Supervision or verbal cues Wheel 150 feet activity did not occur: Safety/medical concerns Function - Locomotion: Ambulation Assistive device: Parallel bars Max distance: 4 Assist level: Maximal assist (Pt 25 - 49%) Walk 10 feet activity did not occur: Safety/medical concerns Walk 50 feet with 2 turns activity did not occur: Safety/medical concerns Walk 150 feet activity did not occur: Safety/medical concerns Walk 10 feet on uneven surfaces activity did not occur: Safety/medical concerns  Function - Comprehension Comprehension: Auditory Comprehension assist level: Understands basic 90% of the time/cues <  10% of the time  Function - Expression Expression: Verbal Expression assist level: Expresses basic needs/ideas:  With extra time/assistive device  Function - Social Interaction Social Interaction assist level: Interacts appropriately 90% of the time - Needs monitoring or encouragement for participation or interaction.  Function - Problem Solving Problem solving assist level: Solves basic problems with no assist  Function - Memory Memory assist level: Recognizes or recalls 75 - 89% of the time/requires cueing 10 - 24% of the time Medical Problem List and Plan: 1. Decreased functional mobility with aphasiasecondary to multifocal intraparenchymal hemorrhages -CIR PT, OT, SLP team conf in am2. DVT Prophylaxis/Anticoagulation: SCDs.  3. Pain Management/trigeminal neuralgia: Resume Neurontin 100-200 mg at bedtime as needed- no change at present, no facial pain 4. Mood/Alzheimer's dementia: Provide emotional support.[place] with family 5. Neuropsych: This patient iscapable of making decisions on herown behalf. 6. Skin/Wound Care: Routine skin checks, foam prophyllactic sacral dressing 7. Fluids/Electrolytes/Nutrition: Routine I&O's with follow-up chemistries, meal intake 25% for dinner, alb low start prostat 8.Hypertension. Norvasc 10 mg daily, Lasix 40 mg daily, some lability, continue to monitor, no changes to medication 10/21/2016 Vitals:   10/20/16 1414 10/21/16 0512  BP: (!) 125/35 (!) 146/56  Pulse: 75 66  Resp: 18 18  Temp: 98.2 F (36.8 C) 98.5 F (36.9 C)    9Hyperlipidemia. Pravachol 10.  Urinary incont - had some problems PTA no dysuria, no fever may be CVA related spastic bladder , rec toileting program 11. Constipation. Senna twice a day, No diarrhea LOS (Days) 7 A FACE TO FACE EVALUATION WAS PERFORMED  Alper Guilmette E 10/21/2016, 8:59 AM

## 2016-10-21 NOTE — Progress Notes (Signed)
Physical Therapy Session Note  Patient Details  Name: Julia Floyd MRN: [id number] Date of Birth: 06-06-1930  Today's Date: 10/21/2016 PT Individual Time: 1045-1200 and 1445-1530 PT Individual Time Calculation (min): 75 min and 45 min  Short Term Goals: Week 1:  PT Short Term Goal 1 (Week 1): Pt will perform bed mobility with min assist from PT PT Short Term Goal 2 (Week 1): Pt will transfer to and from [facility] with min assist from PT PT Short Term Goal 3 (Week 1): Pt will propell WC 159f with min assist  PT Short Term Goal 4 (Week 1): Pt will perform sit<>stand with min assist and BUE support.   Skilled Therapeutic Interventions/Progress Updates: Pt presented on toilet hand off from NT. Pt attempted to perform peri-hygiene however was unable to complete task. Pt [facility] position while PTA performed peri-care then [facility] w/c. After hand cleaning pt propelled w/c from room to nsg staion for endurance 1382fwith min guard and cues for increased LUE usage. Performed SB transfer to mat "downhill" with modA and cues for increased anterior wt shift and tactile cues for head/hips relationship. Performed sit to stand from [place], modA from lowered mat, minA from StEdgertonStanding tolerance avg approx 1 min per episode with multimodal cues for erect posture. Pt demonstrated improved knee extension and forward hip. Pt required therapeutic breaks after each standing trial. Performed squat pivot transfer modA to return to chair and pt able to propel additional 12056fo room. Pt transported remaining distance and remained in w/c to await lunch with call bell within reach and neetds met.   Tx2: Pt presented in w/c agreeable to therapy. PTA transported pt to rehab gym for time management. Performed squat pivot transfer to R with minA and additional time. Cues for forward wt shift and hand placement. Pt participated in Wii games for  unsupported sitting endurance. Pt sat unsupported x 15 min before fatigue. Pt returned to w/c via squat pivot with min guard. Pt propelled with use of BLE and modA 25 ft for LE strengthening. PTA assist for pushing feet to ground for traction and provided verbal cues for increased knee flexion to facilitate propulsion. Pt transported back to room and remained in w/c with call bell within reach and needs met.      Therapy Documentation Precautions:  Precautions Precautions: Fall Precaution Comments: General weakness L>R; dementia Restrictions Weight Bearing Restrictions: No General:   Vital Signs:   See Function Navigator for Current Functional Status.   Therapy/Group: Individual Therapy  Julia Floyd  Julia Floyd, PTA  10/21/2016, 12:29 PM

## 2016-10-21 NOTE — Progress Notes (Addendum)
Occupational Therapy Session Note  Patient Details  Name: Julia Floyd MRN: [id number] Date of Birth: 06/05/1930  Today's Date: 10/21/2016 OT Individual Time: 0830-0930 and 1300-1420 (45 minutes as make-up time) OT Individual Time Calculation (min): 60 min and 80 min   Short Term Goals: Week 1:  OT Short Term Goal 1 (Week 1): Pt will complete toilet transfer with mod A OT Short Term Goal 2 (Week 1): Pt will don shirt with min A OT Short Term Goal 3 (Week 1): Pt will dress LB with mod A OT Short Term Goal 4 (Week 1): Pt will stand with mod A in order for caregiver to provide care  Skilled Therapeutic Interventions/Progress Updates:    Session One: Pt seen for OT ADL bathing/dressing session.Pt in supine upon arrival, awake and ready for tx session.She transferred to EOB from flat bed using bed rails, assist required to advance and position hips with use of chuck pad and increased time and effort for pt. She completed max A stand pivot transfer, unable to follow commands/ sequencing for squat pivot this morning. Bathing/dressing completed from w/c level at sink with assist for set-up for UB bathing. With steadying assist, pt able to reach towards floor to wash B feet, however, required assist for threading pants on. Pt completed x3 sit <> stand during LB bathing/dressing process in roder for hygiene and clothing management to be completed total A while pt used B UEs for support on sink. Pt left seated in w/c at end of session, all needs in reach and RN arriving to administer AM meds. Per pt request, obtained phone numbers for her daugheter's from [place] as pt unable to recall numbers or what she had done with sheet that had numbers listed.  Session Two: Pt seen for OT session focusing on FM coordination and functional transfers. Pt sitting up in w/c upon arrival, finishing lunch and requesting assist with opening water bottle. She was able to close water bottle with cues to use L UE as gross  assist stabilizer level. In therapy gym, completed fine motor activity utilizing small pegs with L hand. Pt required increased time and effort for manipulation, howver demonstrated functional movements with in hand manipulation abilities. She required min cuing thorughout task to recall purpose and direction of peg board game.  She then completed x4 sliding board transfers w/c <> EOM. Required assist to place board and worked with pt on directing caregiver with proper set-up and technique of transfer. Completed even and uphill/downhill transfers with guarding assist for all. Recommending this method for transfers at d/c, and will need to complete caregiver training with pt's family prior to d/c.   Upon return to room, completed toileting task with pt completing mod A squat pivot transfer to [facility] over toilet. She was able to complete push up from toilet while therapist completed clothing management, hygiene completed in same manner. Was unable to pull up pants and brief in this manner and therefore used STEDY for stand from otilet to complete clothing management and return to w/c. Pt left seated in w/c at end of session, all needs in reach.   Therapy Documentation Precautions:  Precautions Precautions: Fall Precaution Comments: General weakness L>R; dementia Restrictions Weight Bearing Restrictions: No Pain:   No/denies pain  See Function Navigator for Current Functional Status.   Therapy/Group: Individual Therapy  Lewis, Uthman Mroczkowski C 10/21/2016, 6:59 AM

## 2016-10-22 ENCOUNTER — Inpatient Hospital Stay (HOSPITAL_COMMUNITY): Payer: Medicare Other | Admitting: Physical Therapy

## 2016-10-22 ENCOUNTER — Inpatient Hospital Stay (HOSPITAL_COMMUNITY): Payer: Medicare Other | Admitting: Occupational Therapy

## 2016-10-22 LAB — BASIC METABOLIC PANEL
Anion gap: 6 (ref 5–15)
BUN: 15 mg/dL (ref 6–20)
CHLORIDE: 105 mmol/L (ref 101–111)
CO2: 28 mmol/L (ref 22–32)
CREATININE: 0.81 mg/dL (ref 0.44–1.00)
Calcium: 9.3 mg/dL (ref 8.9–10.3)
GFR calc non Af Amer: >60 mL/min (ref >60)
Glucose, Bld: 114 mg/dL — ABNORMAL HIGH (ref 65–99)
POTASSIUM: 4.1 mmol/L (ref 3.5–5.1)
SODIUM: 139 mmol/L (ref 135–145)

## 2016-10-22 MED ORDER — FUROSEMIDE 20 MG PO TABS
20.0000 mg | ORAL_TABLET | Freq: Every day | ORAL | Status: DC
  Administered 2016-10-23 – 2016-10-29 (×7): 20 mg via ORAL
  Filled 2016-10-22 (×7): qty 1

## 2016-10-22 NOTE — Progress Notes (Signed)
Physical Therapy Session Note  Patient Details  Name: Julia Floyd MRN: [id number] Date of Birth: 1931/01/04  Today's Date: 10/22/2016 PT Individual Time: 0801-0858 PT Individual Time Calculation (min): 57 min   Short Term Goals: Week 1:  PT Short Term Goal 1 (Week 1): Pt will perform bed mobility with min assist from PT PT Short Term Goal 2 (Week 1): Pt will transfer to and from [facility] with min assist from PT PT Short Term Goal 3 (Week 1): Pt will propell WC 1109ft with min assist  PT Short Term Goal 4 (Week 1): Pt will perform sit<>stand with min assist and BUE support.   Skilled Therapeutic Interventions/Progress Updates:    Pt in bed upon arrival, agreeable to PT session. Pt declined donning additional clothing at this time, waiting to bathe later. Bed Mobility: supine>sitting supervision with HOB elevated and using rails. Transfers: sit<>stand performed using stedy. While in stedy performing sit<>stand X3 with max time of 65 seconds. Cues for posture and encouragement provided. Transfer performed bed>w/c using stedy. Pt propelling w/c X 75 ft using UEs and time. Sliding board transfer performed w/c<>mat table with mod assist. Cues for sequence as well as working on anterior lean. Following session, pt returned to room. Up in w/c with family present, all needs in reach.   Therapy Documentation Precautions:  Precautions Precautions: Fall Precaution Comments: General weakness L>R; dementia Restrictions Weight Bearing Restrictions: No Pain: Pain Assessment Pain Assessment: No/denies pain   See Function Navigator for Current Functional Status.   Therapy/Group: Individual Therapy  Linard Millers, PT 10/22/2016, 4:53 PM

## 2016-10-22 NOTE — Plan of Care (Signed)
Problem: RH Dressing Goal: LTG Patient will perform lower body dressing w/assist (OT) LTG: Patient will perform lower body dressing with assist, with/without cues in positioning using equipment (OT)  Outcome: Not Applicable Date Met: 94/09/82 Goal d/c as recommending total A at d/c while pt maintains static standing balance. Krystyne Tewksbury Lewis, OTR/L  Problem: RH Toileting Goal: LTG Patient will perform toileting w/assist, cues/equip (OT) LTG: Patient will perform toiletiing (clothes management/hygiene) with assist, with/without cues using equipment (OT)  Outcome: Not Applicable Date Met: 86/75/19 Goal d/c as pt required total A PTA. Napoleon Form, OTR/L

## 2016-10-22 NOTE — Progress Notes (Signed)
Occupational Therapy Weekly Progress Note  Patient Details  Name: Julia Floyd MRN: [id number] Date of Birth: 1930-08-23  Beginning of progress report period: October 15, 2016 End of progress report period: October 22, 2016  Today's Date: 10/22/2016 OT Individual Time: 1100-1150 OT Individual Time Calculation (min): 50 min  OT Missed Time: 10 min (pt fatigue)   Patient has met 2 of 4 short term goals.  Pt making steady progress towards OT goals. She is now able to complete functional transfers at min-mod level with sliding board or squat pivot. She requires total A for LB dressing and toileting tasks with max A to stand at sink/ grab bars. Have begun family education with pt's daughter, however, will need to schedule more hands on family training as d/c approaches.   Patient continues to demonstrate the following deficits: muscle weakness, decreased cardiorespiratoy endurance and decreased sitting balance, decreased standing balance, decreased postural control, hemiplegia and decreased balance strategies and therefore will continue to benefit from skilled OT intervention to enhance overall performance with BADL and Reduce care partner burden.  Patient not progressing toward long term goals.  See goal revision..  Plan of care revisions: LB dressing and toileting goals d/c as recommending total A while pt stands with B UE support.  OT Short Term Goals Week 1:  OT Short Term Goal 1 (Week 1): Pt will complete toilet transfer with mod A OT Short Term Goal 1 - Progress (Week 1): Met OT Short Term Goal 2 (Week 1): Pt will don shirt with min A OT Short Term Goal 2 - Progress (Week 1): Met OT Short Term Goal 3 (Week 1): Pt will dress LB with mod A OT Short Term Goal 3 - Progress (Week 1): Not met OT Short Term Goal 4 (Week 1): Pt will stand with mod A in order for caregiver to provide care OT Short Term Goal 4 - Progress (Week 1): Not met Week 2:  OT Short Term Goal 1 (Week 2): STG=LTG due to  LOS  Skilled Therapeutic Interventions/Progress Updates:    Pt seen for OT session focusing on caregiver training and d/c planning with functional transfers and toileting task. Pt asleep in w/c upon arrival with 2 daughters present. Pt easily awaken and agreeable to tx session though voicing complaints of increased fatigue.  Discussed at length home layout setting and DME. Recommendations made for grab bar to be installed so pt can pull up into standing for LB bathing/dressing and toileting tasks to be completed total A while pt stands with B UEs. Pt's granddaughter to provide pictures of home bathroom set-up to discuss shower and toilet accessibility. Completed sliding board transfer w/c <> drop arm BSC with mod-max A and +2 to steady equipment. Pt stood at sink and hygiene completed and new brief donned as pt had been incontinent of BM without awareness. She required x3 sit <> stands requiring max A each trial to stand as pt unable to tolerate >~10 seconds standing each trial before requiring seated rest break. Pt returned to w/c at end of session, left with all needs in reach and daughter's present.  Discussed throughout session DME, pt's power w/c vs standard w/c, HHA, and d/c planning.   Therapy Documentation Precautions:  Precautions Precautions: Fall Precaution Comments: General weakness L>R; dementia Restrictions Weight Bearing Restrictions: No Pain:   No/ denies pain  See Function Navigator for Current Functional Status.   Therapy/Group: Individual Therapy  Lewis, [name] C 10/22/2016, 7:14 AM

## 2016-10-22 NOTE — Progress Notes (Signed)
Physical Therapy Session Note  Patient Details  Name: Julia Floyd MRN: [id number] Date of Birth: [birth year]  Today's Date: 10/22/2016 PT Individual Time: 1446-1600 PT Individual Time Calculation (min): 74 min   Short Term Goals: Week 1:  PT Short Term Goal 1 (Week 1): Pt will perform bed mobility with min assist from PT PT Short Term Goal 2 (Week 1): Pt will transfer to and from [facility] with min assist from PT PT Short Term Goal 3 (Week 1): Pt will propell WC 116f with min assist  PT Short Term Goal 4 (Week 1): Pt will perform sit<>stand with min assist and BUE support.   Skilled Therapeutic Interventions/Progress Updates:      Pt received sitting in WC and agreeable to PT. [place]   Blocked practice using BSC for BUE to simulate transfer technique pt reports using at baseline. PT requires mod verbal and tactile cues for set up of transfer. PT required to provided min assist to stabilize BSC and initiate upward movement in tranfer. PT educated pt on how this transfer technique would be unsafe without assist.in home setting. PT also instructed pt in blocked practice modified squat pivot transfer with min assist. Only min cues for set up and safety with squat pivot technique.  Bed mobiltiy instructed by PT with supervision assist and min cues for BLE management. Pt noted to have improved trunk and upper body sequencing compared to previos PT sessions.    WC mobility x 150. With BUE propulsion and min cues for maintenance of straight pth in halls as well as use of LUE for obstacle navigation.   Patient returned too room and [facility] and all needs met.     Therapy Documentation Precautions:  Precautions Precautions: Fall Precaution Comments: General weakness L>R; dementia Restrictions Weight Bearing Restrictions: No Pain 0/10   See Function Navigator for Current Functional  Status.   Therapy/Group: Individual Therapy  ALorie Phenix7/25/2018, 6:22 PM

## 2016-10-22 NOTE — Progress Notes (Addendum)
Occupational Therapy Session Note  Patient Details  Name: Julia Floyd MRN: [id number] Date of Birth: 01/28/1931  Today's Date: 10/22/2016 OT Individual Time: 1751-0258 OT Individual Time Calculation (min): 55 min    Short Term Goals: Week 1:  OT Short Term Goal 1 (Week 1): Pt will complete toilet transfer with mod A OT Short Term Goal 1 - Progress (Week 1): Met OT Short Term Goal 2 (Week 1): Pt will don shirt with min A OT Short Term Goal 2 - Progress (Week 1): Met OT Short Term Goal 3 (Week 1): Pt will dress LB with mod A OT Short Term Goal 3 - Progress (Week 1): Not met OT Short Term Goal 4 (Week 1): Pt will stand with mod A in order for caregiver to provide care OT Short Term Goal 4 - Progress (Week 1): Not met  Skilled Therapeutic Interventions/Progress Updates:    Pt received sitting up in w/c, agreeable to OT tx session. Focus of session on ADL retraining and functional mobility transfers. Pt completed bathing/dressing w/c level at sink and with use of stedy. Pt washes UB with setup and assist for washing back, dons overhead shirt with MinA. Pt completes LB bathing with assist for washing feet and buttocks. Pt completed multiple sit<>stand transfers in stedy for LB bathing and LB dressing as Pt only able to maintain static standing for very brief periods of time, requires MaxA for performing transfer and multimodal cues for achieving upright position. Pt also with incontinent BM and urination during LB bathing/dressing with totalA for pericare. TotalA for LB dressing and footwear. Challenged Pt to maintain static sitting balance in stedy with single UE and with no UE support with Pt able to do so for approx 20 seconds with therapist providing min steady assist. Ended session with Pt seated in w/c, call bell and needs within reach, daughters present in room.   Therapy Documentation Precautions:  Precautions Precautions: Fall Precaution Comments: General weakness L>R;  dementia Restrictions Weight Bearing Restrictions: No    Pain: Pain Assessment Pain Assessment: No/denies pain  See Function Navigator for Current Functional Status.   Therapy/Group: Individual Therapy  Raymondo Band 10/22/2016, 2:07 PM

## 2016-10-22 NOTE — Progress Notes (Addendum)
Subjective/Complaints:  Patient is visiting with her daughter who will be assisting her at home. ROS- denies CP/SOB, - N/V/D  Objective: Vital Signs: Blood pressure (!) 121/41, pulse 73, temperature 98.4 F (36.9 C), temperature source Oral, resp. rate 16, height 5' 6"  (1.676 m), weight 78.5 kg (173 lb), SpO2 100 %. No results found. No results found for this or any previous visit (from the past 72 hour(s)).   HEENT: normal Cardio: RRR and no murmur Resp: CTA B/L and unlabored GI: BS positive and NT, ND Extremity:  No Edema Skin:   Intact, pain over the sacrum but no breakdown Neuro: Alert/Oriented, Abnormal Sensory LT diminished on left side and Abnormal Motor 4/5 BUE, 4- BLE, 3- LLE, min dysmetria L FNF Musc/Skel:  Other no pain with UE or LE ROM Gen NAD   Assessment/Plan: 1. 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 Physiatrist is providing close team supervision and 24 hour management of active medical problems listed below. Physiatrist and rehab team continue to assess barriers to discharge/monitor patient progress toward functional and medical goals. FIM: Function - Bathing Position: Wheelchair/chair at sink Body parts bathed by patient: Right arm, Left arm, Chest, Abdomen, Left upper leg, Right upper leg, Front perineal area, Right lower leg, Left lower leg Body parts bathed by helper: Back, Buttocks Assist Level: Touching or steadying assistance(Pt > 75%)  Function- Upper Body Dressing/Undressing What is the patient wearing?: Pull over shirt/dress Pull over shirt/dress - Perfomed by patient: Thread/unthread right sleeve, Thread/unthread left sleeve, Put head through opening Pull over shirt/dress - Perfomed by helper: Pull shirt over trunk Assist Level: Touching or steadying assistance(Pt > 75%) Function - Lower Body Dressing/Undressing What is the patient  wearing?: Non-skid slipper socks, Pants, Shoes Position: Wheelchair/chair at sink Pants- Performed by helper: Thread/unthread right pants leg, Thread/unthread left pants leg, Pull pants up/down Non-skid slipper socks- Performed by helper: Don/doff right sock, Don/doff left sock Shoes - Performed by helper: Don/doff right shoe, Don/doff left shoe, Fasten right, Fasten left Assist for footwear: Maximal assist Assist for lower body dressing:  (Standing in STEDY)  Function - Toileting Toileting steps completed by helper: Performs perineal hygiene, Adjust clothing prior to toileting, Adjust clothing after toileting Toileting Assistive Devices: Grab bar or rail Assist level: Two helpers  Function - Air cabin crew transfer assistive device: Grab bar, Elevated toilet seat/BSC over toilet Mechanical lift: Stedy (STEADY from toilet only) Assist level to toilet: Moderate assist (Pt 50 - 74%/lift or lower) Assist level from toilet: Moderate assist (Pt 50 - 74%/lift or lower)  Function - Chair/bed transfer Chair/bed transfer method: Squat pivot Chair/bed transfer assist level: Touching or steadying assistance (Pt > 75%) Chair/bed transfer assistive device: Armrests Mechanical lift: Stedy Chair/bed transfer details: Verbal cues for technique, Tactile cues for sequencing  Function - Locomotion: Wheelchair Type: Manual Max wheelchair distance: 50 ft Assist Level: Supervision or verbal cues Assist Level: Supervision or verbal cues Wheel 150 feet activity did not occur: Safety/medical concerns Function - Locomotion: Ambulation Assistive device: Parallel bars Max distance: 4 Assist level: Maximal assist (Pt 25 - 49%) Walk 10 feet activity did not occur: Safety/medical concerns Walk 50 feet with 2 turns activity did not occur: Safety/medical concerns Walk 150 feet activity did not occur: Safety/medical concerns Walk 10 feet on uneven surfaces activity did not occur: Safety/medical  concerns  Function - Comprehension Comprehension: Auditory Comprehension assist level: Understands basic 90% of  the time/cues < 10% of the time  Function - Expression Expression: Verbal Expression assist level: Expresses basic needs/ideas: With extra time/assistive device  Function - Social Interaction Social Interaction assist level: Interacts appropriately 90% of the time - Needs monitoring or encouragement for participation or interaction.  Function - Problem Solving Problem solving assist level: Solves basic problems with no assist  Function - Memory Memory assist level: Recognizes or recalls 75 - 89% of the time/requires cueing 10 - 24% of the time Medical Problem List and Plan: 1. Decreased functional mobility with aphasiasecondary to multifocal intraparenchymal hemorrhages -CIR PT, OT, SLP Team conference today please see physician documentation under team conference tab, met with team face-to-face to discuss problems,progress, and goals. Formulized individual treatment plan based on medical history, underlying problem and comorbidities.2. DVT Prophylaxis/Anticoagulation: SCDs.  3. Pain Management/trigeminal neuralgia: Resume Neurontin 100-200 mg at bedtime as needed- no change at present, no facial pain 4. Mood/Alzheimer's dementia: Provide emotional support.[place] with family 5. Neuropsych: This patient iscapable of making decisions on herown behalf. 6. Skin/Wound Care: Routine skin checks, foam prophyllactic sacral dressing 7. Fluids/Electrolytes/Nutrition: Routine I&O's with follow-up chemistries, meal intake 25% for dinner, alb low start prostat, check basic metabolic package 8.Hypertension. Norvasc 10 mg daily, Lasix 40 mg daily, will reduce Lasix to 20 mg 10/22/2016.  Vitals:   10/21/16 1436 10/22/16 0500  BP: (!) 105/41 (!) 121/41  Pulse: 73 73  Resp: 18 16  Temp: (!) 97.5 F (36.4 C) 98.4 F (36.9 C)    9Hyperlipidemia. Pravachol 10.   Urinary incont - had some problems PTA no dysuria, no fever may be CVA related spastic bladder , rec toileting program 11. Constipation. Senna twice a day, No diarrhea 12.  Type 2 diabetes, on metformin, check CBGs LOS (Days) 8 A FACE TO FACE EVALUATION WAS PERFORMED  KIRSTEINS,ANDREW E 10/22/2016, 9:49 AM

## 2016-10-23 ENCOUNTER — Inpatient Hospital Stay (HOSPITAL_COMMUNITY): Payer: Medicare Other | Admitting: Physical Therapy

## 2016-10-23 ENCOUNTER — Inpatient Hospital Stay (HOSPITAL_COMMUNITY): Payer: Medicare Other | Admitting: Occupational Therapy

## 2016-10-23 LAB — GLUCOSE, CAPILLARY
GLUCOSE-CAPILLARY: 123 mg/dL — AB (ref 65–99)
GLUCOSE-CAPILLARY: 160 mg/dL — AB (ref 65–99)
GLUCOSE-CAPILLARY: 119 mg/dL — AB (ref 65–99)

## 2016-10-23 MED ORDER — SENNOSIDES-DOCUSATE SODIUM 8.6-50 MG PO TABS
1.0000 | ORAL_TABLET | Freq: Every day | ORAL | Status: DC
  Administered 2016-10-24 – 2016-10-27 (×4): 1 via ORAL
  Filled 2016-10-23 (×5): qty 1

## 2016-10-23 NOTE — Progress Notes (Signed)
Physical Therapy Session Note  Patient Details  Name: Julia Floyd MRN: [id number] Date of Birth: 02/02/1931  Today's Date: 10/23/2016 PT Individual Time: 1000-1045 PT Individual Time Calculation (min): 45 min   Short Term Goals: Week 2:  PT Short Term Goal 1 (Week 2): Pt will perform bed<>WC transfer with supervision assist and LRAD  PT Short Term Goal 2 (Week 2): Pt will ambulate 92ft with mod assist and LRAD  PT Short Term Goal 3 (Week 2): PT will perform bed mobility with supervision assist consistently PT Short Term Goal 4 (Week 2): pt will perform sit<>stand with min assist and RW.   Skilled Therapeutic Interventions/Progress Updates: Pt received seated in w/c, denies pain and agreeable to treatment. Transported to/from gym totalA for energy conservation. Gait x6' with maxA, multimodal cueing and facilitation for upright posture, LLE stance control. Sit <>stand and static standing balance with R rail in hall, mirror visual feedback for upright posture, R weight shift and LLE stance control. Slideboard transfer w/c <>mat table with min guard, assist for setting up board placement and cues for sequencing and technique. Dynamic sitting balance with RUE reaching to R side to facilitate L trunk activation/shortening and neutral spine alignment. Wedge placed under L hip to facilitate neutral pelvic alignment. NDT facilitation at L trunk to provide corrective forces to achieve neutral spine, stability ball utilized behind back to thoracic trunk extension and anterior chest stretch. Returned to room totalA at end of session; remained seated in w/c at end of session, all needs in reach.      Therapy Documentation Precautions:  Precautions Precautions: Fall Precaution Comments: General weakness L>R; dementia Restrictions Weight Bearing Restrictions: No Pain: Pain Assessment Pain Assessment: No/denies pain  See Function Navigator for Current Functional Status.   Therapy/Group:  Individual Therapy  Luberta Mutter 10/23/2016, 11:55 AM

## 2016-10-23 NOTE — Progress Notes (Signed)
Physical Therapy Weekly Progress Note  Patient Details  Name: Julia Floyd MRN: [id number] Date of Birth: Aug 09, 1930  Beginning of progress report period: October 16, 2016 End of progress report period: October 23, 2016  Today's Date: 10/23/2016 PT Individual Time: 0815-0900 AND 1535-1630 PT Individual Time Calculation (min): 45 min AND 33mn   Patient has met 4 of 4 short term goals.  Pt has demonstrated slow progress toward goals. Pt has progressed to intermittent min-mod assist with transfer as supervision assist for bed mobility with increased time. Pt has also improved UE endurance and able to proprell WC >1571fwith supervision assist from PT>   Patient continues to demonstrate the following deficits muscle weakness, unbalanced muscle activation and decreased coordination and decreased standing balance, decreased postural control, hemiplegia and decreased balance strategies and therefore will continue to benefit from skilled PT intervention to increase functional independence with mobility.  Patient progressing toward long term goals..  Continue plan of care.  PT Short Term Goals Week 1:  PT Short Term Goal 1 (Week 1): Pt will perform bed mobility with min assist from PT PT Short Term Goal 1 - Progress (Week 1): Met PT Short Term Goal 2 (Week 1): [facility] from [facility] PT Short Term Goal 2 - Progress (Week 1): Met PT Short Term Goal 3 (Week 1): Pt will propell WC 15040fith min assist  PT Short Term Goal 3 - Progress (Week 1): Met PT Short Term Goal 4 (Week 1): Pt will perform sit<>stand with min assist and BUE support.  PT Short Term Goal 4 - Progress (Week 1): Partly met Week 2:  PT Short Term Goal 1 (Week 2): Pt will perform bed<>WC transfer with supervision assist and LRAD  PT Short Term Goal 2 (Week 2): Pt will ambulate 3f25fth mod assist and LRAD  PT Short Term Goal 3 (Week 2): PT will perform bed mobility with supervision assist  consistently PT Short Term Goal 4 (Week 2): pt will perform sit<>stand with min assist and RW.   Skilled Therapeutic Interventions/Progress Updates:    Session 1.  Pt received sitting in WC and agreeable to PT, but requesting increased time to finch breakfast.   Sit<>stand transfer training in parallel bars. X 5 with min progressing to supervision assist. Pt instructing in UE placement to pull on Bilateral bars and progressing to pushing up with 1 UE from [facility] mar[facility] x 6 BLE with BUE on Parallel bars. Min assist from PT for safety. No knee instabilitynoted.  Gait in parallel bars. X 8 ft with mod assist and WC follow. Noted weakness in L glutes and significant trendelenburg gait with increased distance.   Stand pivot transfer with RW x 1 with mod assist and max cues for AD management and gait pattern.    Gait with RW x 5ft 21fh mod Assist from PT for L hip stabilization, RW management and improved posture.   Patient returned too room and [facility] call bell in reach and all needs met.     SessiGould received sitting in WC and agreeable to PT. PT transported pt [facility] Pt performing 50ft 38fC mobility in crowded environment with supervision assist from PT.  Sit<>stand transfer training with RW x 4 with mod assist progressing to min assist from PT. Max cues for anteior weight shift, UE placement and erect posture.  Reciprocal marches in [place]  with mod assist to prevent LOB due to L knee instability.  Seated BLE therex with level 2 tband. All completed x 10 BLE. HS curls, SLR, hip extension, hip abduction, reciprocal marches.  Pt performed WC mobility through hall of hospital x 160f back to room once returned to Rehab unit; supervision assist from PT for cues to improve use of LUE.   Patient returned too room and [facility] in reach and all needs met.     Therapy Documentation Precautions:   Precautions Precautions: Fall Precaution Comments: General weakness L>R; dementia Restrictions Weight Bearing Restrictions: No General: PT Amount of Missed Time (min): 15 Minutes PT Missed Treatment Reason: Other (Comment) (pt eating. ) Pain: 0/10   See Function Navigator for Current Functional Status.  Therapy/Group: Individual Therapy  ALorie Phenix7/26/2018, 10:18 AM

## 2016-10-23 NOTE — Progress Notes (Signed)
Occupational Therapy Session Note  Patient Details  Name: Julia Floyd MRN: [id number] Date of Birth: [birth year]  Today's Date: 10/23/2016 OT Individual Time: 1050-1200 OT Individual Time Calculation (min): 70 min    Short Term Goals: Week 2:  OT Short Term Goal 1 (Week 2): STG=LTG due to LOS  Skilled Therapeutic Interventions/Progress Updates:    Pt seen for OT session focusing on ADL re-training and functional transfers. Pt received sitting on toilet, ready for assist. She stood into STEDY with max A and hygiene and clothing management completed total A. Pt able to tolerated ~1 minute standing in STEDY for assist to be provided, improvements from previous OT sessions. She denied full bathing/dressing session this morning. Pants socks and shoes donned total A, pt stood at sink with B UE support while therapist managed pants. She completed grooming tasks from w/c level with assist for set-up, able to use R UE at slightly diminished level. She self propelled w/c ~20 ft for UE strengthtening and endurance before voicing increased fatigue and taken remainder of way to gym for time and energy conservation.  Discussed at length with pt PLOF with transfers, transfer technique and layout of transfers for bed <> w/c and for [facility] transfers. Unsure of reliability of set-up and PLOF as pt had difficulty clearly describing set-up. Therapist has emailed pt's granddaughter in order to receive more information and asking for pictures of home set-up. Pt completed stand pivot transfers at side of parallel bars. She was able to stand with min A and complete stand step pivot transfer w/c <> upright chair with arm rests with steadying assist. Completed x4 trials with seated rest breaks btwn trials. Recommending pt have someone provide steadying assist for all transfers at d/c.  Pt returned to room at end of session, left with all needs in reach awaiting lunch.   Therapy Documentation Precautions:   Precautions Precautions: Fall Precaution Comments: General weakness L>R; dementia Restrictions Weight Bearing Restrictions: No Pain:   No/denies pain  See Function Navigator for Current Functional Status.   Therapy/Group: Individual Therapy  Lewis, Nykiah Ma C 10/23/2016, 7:17 AM

## 2016-10-23 NOTE — Progress Notes (Signed)
Subjective/Complaints:  Patient seen in physical therapy. Discussed with physical therapy. Mobility goals. Patient has been in a power chair for about 2 years. Patient states that it is because she had a prior stroke, which she did not have any significant weakness in her arms and legs. At this point. Her lower extremity endurance is very poor and PT is not optimistic about ability to ambulate with a walker. ROS- denies CP/SOB, - N/V/D  Objective: Vital Signs: Blood pressure (!) 115/43, pulse 72, temperature 99.1 F (37.3 C), temperature source Oral, resp. rate 16, height _0  (1.676 m), weight 78.5 kg (173 lb), SpO2 100 %. No results found. Results for orders placed or performed during the hospital encounter of 10/14/16 (from the past 72 hour(s))  Basic metabolic panel     Status: Abnormal   Collection Time: 10/22/16 12:07 PM  Result Value Ref Range   Sodium 139 135 - 145 mmol/L   Potassium 4.1 3.5 - 5.1 mmol/L   Chloride 105 101 - 111 mmol/L   CO2 28 22 - 32 mmol/L   Glucose, Bld 114 (H) 65 - 99 mg/dL   BUN 15 6 - 20 mg/dL   Creatinine, Ser 0.81 0.44 - 1.00 mg/dL   Calcium 9.3 8.9 - 10.3 mg/dL   GFR calc non Af Amer >60 >60 mL/min   GFR calc Af Amer >60 >60 mL/min    Comment: (NOTE) The eGFR has been calculated using the CKD EPI equation. This calculation has not been validated in all clinical situations. eGFR's persistently <60 mL/min signify possible Chronic Kidney Disease.    Anion gap 6 5 - 15     HEENT: normal Cardio: RRR and no murmur Resp: CTA B/L and unlabored GI: BS positive and NT, ND Extremity:  No Edema Skin:   Intact, pain over the sacrum but no breakdown Neuro: Alert/Oriented, Abnormal Sensory LT diminished on left side and Abnormal Motor 4/5 BUE, 4- BLE, 3- LLE, min dysmetria L FNF Musc/Skel:  Other no pain with UE or LE ROM Gen NAD   Assessment/Plan: 1. 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  which require 3+ hours per day of  interdisciplinary therapy in a comprehensive inpatient rehab setting. Physiatrist is providing close team supervision and 24 hour management of active medical problems listed below. Physiatrist and rehab team continue to assess barriers to discharge/monitor patient progress toward functional and medical goals. FIM: Function - Bathing Position: Wheelchair/chair at sink Body parts bathed by patient: Right arm, Left arm, Chest, Abdomen, Left upper leg, Right upper leg, Front perineal area, Right lower leg, Left lower leg Body parts bathed by helper: Back, Buttocks Assist Level: Touching or steadying assistance(Pt > 75%)  Function- Upper Body Dressing/Undressing What is the patient wearing?: Pull over shirt/dress Pull over shirt/dress - Perfomed by patient: Thread/unthread right sleeve, Thread/unthread left sleeve, Put head through opening Pull over shirt/dress - Perfomed by helper: Pull shirt over trunk Assist Level: Touching or steadying assistance(Pt > 75%) Function - Lower Body Dressing/Undressing What is the patient wearing?: Non-skid slipper socks, Pants, Shoes Position: Other (comment) (w/c and stedy ) Pants- Performed by helper: Thread/unthread right pants leg, Thread/unthread left pants leg, Pull pants up/down Non-skid slipper socks- Performed by helper: Don/doff right sock, Don/doff left sock Shoes - Performed by helper: Don/doff right shoe, Don/doff left shoe, Fasten right, Fasten left Assist for footwear: Maximal assist Assist for lower body dressing:  (totalA)  Function - Toileting Toileting steps completed by helper: Performs perineal hygiene, Adjust clothing prior  to toileting, Adjust clothing after toileting Toileting Assistive Devices: Grab bar or rail Assist level: Two helpers  Function - Air cabin crew transfer assistive device: Drop arm commode, Sliding board Mechanical lift: Stedy (STEADY from toilet only) Assist level to toilet: Maximal assist (Pt 25 -  49%/lift and lower) Assist level from toilet: Maximal assist (Pt 25 - 49%/lift and lower)  Function - Chair/bed transfer Chair/bed transfer method: Lateral scoot Chair/bed transfer assist level: Moderate assist (Pt 50 - 74%/lift or lower) Chair/bed transfer assistive device: Sliding board Mechanical lift: Stedy Chair/bed transfer details: Verbal cues for technique, Verbal cues for sequencing, Tactile cues for weight bearing, Tactile cues for weight shifting, Tactile cues for posture, Manual facilitation for weight shifting  Function - Locomotion: Wheelchair Type: Manual Max wheelchair distance: 75 ft Assist Level: Supervision or verbal cues Assist Level: Supervision or verbal cues Wheel 150 feet activity did not occur: Safety/medical concerns Function - Locomotion: Ambulation Assistive device: Parallel bars Max distance: 4 Assist level: Maximal assist (Pt 25 - 49%) Walk 10 feet activity did not occur: Safety/medical concerns Walk 50 feet with 2 turns activity did not occur: Safety/medical concerns Walk 150 feet activity did not occur: Safety/medical concerns Walk 10 feet on uneven surfaces activity did not occur: Safety/medical concerns  Function - Comprehension Comprehension: Auditory Comprehension assist level: Understands basic 90% of the time/cues < 10% of the time  Function - Expression Expression: Verbal Expression assist level: Expresses basic needs/ideas: With extra time/assistive device  Function - Social Interaction Social Interaction assist level: Interacts appropriately 90% of the time - Needs monitoring or encouragement for participation or interaction.  Function - Problem Solving Problem solving assist level: Solves basic problems with no assist  Function - Memory Memory assist level: Recognizes or recalls 75 - 89% of the time/requires cueing 10 - 24% of the time Medical Problem List and Plan: 1. Decreased functional mobility with aphasiasecondary to  multifocal intraparenchymal hemorrhages -CIR PT, OT, SLP work towards wheelchair level, goals, patient should be able to propel manual chair rather than relying on power chair at least in the home 2. DVT Prophylaxis/Anticoagulation: SCDs.  3. Pain Management/trigeminal neuralgia: Resume Neurontin 100-200 mg at bedtime as needed- no change at present, no facial pain 4. Mood/Alzheimer's dementia: Provide emotional support.[place] with family 5. Neuropsych: This patient iscapable of making decisions on herown behalf. 6. Skin/Wound Care: Routine skin checks, foam prophyllactic sacral dressing 7. Fluids/Electrolytes/Nutrition: Routine I&O's with follow-up chemistries, meal intake 25% for dinner, alb low on prostat, BUN and creatinine normal. On 10/22/2016 8.Hypertension. Blood pressure has been running low. Over the last several days Norvasc 10 mg daily, Lasix 40 mg daily, will reduce Lasix to 20 mg 10/22/2016.  Vitals:   10/22/16 1352 10/23/16 0500  BP: (!) 112/49 (!) 115/43  Pulse: 79 72  Resp: 18 16  Temp: 98 F (36.7 C) 99.1 F (37.3 C)    9Hyperlipidemia. Pravachol 10.  Urinary incont - had some problems PTA no dysuria, no fever may be CVA related spastic bladder , rec toileting program 11. Constipation. Senna twice a day,To loose stools yesterday. Incontinent. Will reduce senna 12.  Type 2 diabetes, on metformin, check CBGs CBG (last 3) ordered but not recorded No results for input(s): GLUCAP in the last 72 hours.   LOS (Days) 9 A FACE TO FACE EVALUATION WAS PERFORMED  KIRSTEINS,ANDREW E 10/23/2016, 9:41 AM

## 2016-10-24 ENCOUNTER — Inpatient Hospital Stay (HOSPITAL_COMMUNITY): Payer: Medicare Other | Admitting: Occupational Therapy

## 2016-10-24 ENCOUNTER — Inpatient Hospital Stay (HOSPITAL_COMMUNITY): Payer: Medicare Other | Admitting: Physical Therapy

## 2016-10-24 LAB — GLUCOSE, CAPILLARY
GLUCOSE-CAPILLARY: 102 mg/dL — AB (ref 65–99)
GLUCOSE-CAPILLARY: 125 mg/dL — AB (ref 65–99)
Glucose-Capillary: 145 mg/dL — ABNORMAL HIGH (ref 65–99)
Glucose-Capillary: 159 mg/dL — ABNORMAL HIGH (ref 65–99)

## 2016-10-24 NOTE — Progress Notes (Signed)
Social Work Patient ID: Julia Floyd, female   DOB: 19-Feb-1931, 83 y.o.   MRN: [id number]   CSW met with pt and pt's dtr 10-22-16 to update them on team conference discussion and targeted d/c date of 10-29-16.  Pt's dtr will be the one with pt at home and she has been caring for pt a home PTA.  Pt is pleased with the date and feels she will be ready to go home.  CSW wil continue to follow and assist as needed.

## 2016-10-24 NOTE — Progress Notes (Signed)
Subjective/Complaints:  Working with physical therapy in the room. She denies any pain complaints. ROS- denies CP/SOB, - N/V/D  Objective: Vital Signs: Blood pressure (!) 134/46, pulse 75, temperature 98.4 F (36.9 C), temperature source Oral, resp. rate 18, height 5' 6"  (1.676 m), weight 78.5 kg (173 lb), SpO2 100 %. No results found. Results for orders placed or performed during the hospital encounter of 10/14/16 (from the past 72 hour(s))  Basic metabolic panel     Status: Abnormal   Collection Time: 10/22/16 12:07 PM  Result Value Ref Range   Sodium 139 135 - 145 mmol/L   Potassium 4.1 3.5 - 5.1 mmol/L   Chloride 105 101 - 111 mmol/L   CO2 28 22 - 32 mmol/L   Glucose, Bld 114 (H) 65 - 99 mg/dL   BUN 15 6 - 20 mg/dL   Creatinine, Ser 0.81 0.44 - 1.00 mg/dL   Calcium 9.3 8.9 - 10.3 mg/dL   GFR calc non Af Amer >60 >60 mL/min   GFR calc Af Amer >60 >60 mL/min    Comment: (NOTE) The eGFR has been calculated using the CKD EPI equation. This calculation has not been validated in all clinical situations. eGFR's persistently <60 mL/min signify possible Chronic Kidney Disease.    Anion gap 6 5 - 15  Glucose, capillary     Status: Abnormal   Collection Time: 10/23/16 12:06 PM  Result Value Ref Range   Glucose-Capillary 123 (H) 65 - 99 mg/dL  Glucose, capillary     Status: Abnormal   Collection Time: 10/23/16  4:34 PM  Result Value Ref Range   Glucose-Capillary 160 (H) 65 - 99 mg/dL   Comment 1 Notify RN   Glucose, capillary     Status: Abnormal   Collection Time: 10/23/16  9:20 PM  Result Value Ref Range   Glucose-Capillary 119 (H) 65 - 99 mg/dL  Glucose, capillary     Status: Abnormal   Collection Time: 10/24/16  6:45 AM  Result Value Ref Range   Glucose-Capillary 102 (H) 65 - 99 mg/dL     HEENT: normal Cardio: RRR and no murmur Resp: CTA B/L and unlabored GI: BS positive and NT, ND Extremity:  No Edema Skin:   Intact, pain over the sacrum but no breakdown Neuro:  Alert/Oriented, Abnormal Sensory LT diminished on left side and Abnormal Motor 4/5 BUE, 4- BLE, 3- LLE, min dysmetria L FNF Musc/Skel:  Other no pain with UE or LE ROM Gen NAD   Assessment/Plan: 1. 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 Physiatrist is providing close team supervision and 24 hour management of active medical problems listed below. Physiatrist and rehab team continue to assess barriers to discharge/monitor patient progress toward functional and medical goals. FIM: Function - Bathing Position: Wheelchair/chair at sink Body parts bathed by patient: Right arm, Left arm, Chest, Abdomen, Left upper leg, Right upper leg, Front perineal area, Right lower leg, Left lower leg Body parts bathed by helper: Back, Buttocks Assist Level: Touching or steadying assistance(Pt > 75%)  Function- Upper Body Dressing/Undressing What is the patient wearing?: Pull over shirt/dress Pull over shirt/dress - Perfomed by patient: Thread/unthread right sleeve, Thread/unthread left sleeve, Put head through opening Pull over shirt/dress - Perfomed by helper: Pull shirt over trunk Assist Level: Touching or steadying assistance(Pt > 75%) Function - Lower Body Dressing/Undressing What is the patient wearing?: Non-skid slipper socks, Pants, Shoes Position: Other (  comment) (w/c and stedy ) Pants- Performed by helper: Thread/unthread right pants leg, Thread/unthread left pants leg, Pull pants up/down Non-skid slipper socks- Performed by helper: Don/doff right sock, Don/doff left sock Shoes - Performed by helper: Don/doff right shoe, Don/doff left shoe, Fasten right, Fasten left Assist for footwear: Maximal assist Assist for lower body dressing:  (totalA)  Function - Toileting Toileting steps completed by helper: Adjust clothing prior to toileting, Performs perineal hygiene, Adjust clothing after  toileting Toileting Assistive Devices: Grab bar or rail Assist level: More than reasonable time, Touching or steadying assistance (Pt.75%)  Function - Air cabin crew transfer assistive device: Elevated toilet seat/BSC over toilet Mechanical lift: Stedy Assist level to toilet: Maximal assist (Pt 25 - 49%/lift and lower) Assist level from toilet: Maximal assist (Pt 25 - 49%/lift and lower)  Function - Chair/bed transfer Chair/bed transfer method: Stand pivot Chair/bed transfer assist level: Touching or steadying assistance (Pt > 75%) Chair/bed transfer assistive device: Sliding board Mechanical lift: Other Chair/bed transfer details: Verbal cues for technique, Verbal cues for sequencing, Tactile cues for weight bearing, Tactile cues for weight shifting, Tactile cues for posture, Manual facilitation for weight shifting  Function - Locomotion: Wheelchair Type: Manual Max wheelchair distance: 188f  Assist Level: Supervision or verbal cues Assist Level: Supervision or verbal cues Wheel 150 feet activity did not occur: Safety/medical concerns Assist Level: Supervision or verbal cues Function - Locomotion: Ambulation Assistive device: Parallel bars Max distance: 8 Assist level: 2 helpers Walk 10 feet activity did not occur: Safety/medical concerns Walk 50 feet with 2 turns activity did not occur: Safety/medical concerns Walk 150 feet activity did not occur: Safety/medical concerns Walk 10 feet on uneven surfaces activity did not occur: Safety/medical concerns  Function - Comprehension Comprehension: Auditory Comprehension assist level: Follows basic conversation/direction with no assist  Function - Expression Expression: Verbal Expression assist level: Expresses basic needs/ideas: With no assist  Function - Social Interaction Social Interaction assist level: Interacts appropriately 75 - 89% of the time - Needs redirection for appropriate language or to initiate  interaction.  Function - Problem Solving Problem solving assist level: Solves basic 90% of the time/requires cueing < 10% of the time  Function - Memory Memory assist level: Recognizes or recalls 75 - 89% of the time/requires cueing 10 - 24% of the time Medical Problem List and Plan: 1. Decreased functional mobility with aphasiasecondary to multifocal intraparenchymal hemorrhages -CIR PT, OT, SLP 2. DVT Prophylaxis/Anticoagulation: SCDs.  3. Pain Management/trigeminal neuralgia: Resume Neurontin 100-200 mg at bedtime as needed- no change at present, no facial pain 4. Mood/Alzheimer's dementia: [place] 5. Neuropsych: This patient iscapable of making decisions on herown behalf. 6. Skin/Wound Care: Routine skin checks, foam prophyllactic sacral dressing 7. Fluids/Electrolytes/Nutrition: Routine I&O's with follow-up chemistries, meal intake 25% for dinner, alb low on prostat, BUN and creatinine normal. On 10/22/2016 8.Hypertension.Norvasc 10 mg daily,  reduce Lasix to 20 mg 10/22/2016. Blood pressures in acceptable range Vitals:   10/23/16 2100 10/24/16 0506  BP: (!) 120/50 (!) 134/46  Pulse: 76 75  Resp: 18 18  Temp:  98.4 F (36.9 C)    9Hyperlipidemia. Pravachol 10.  Urinary incont - had some problems PTA no dysuria, no fever may be CVA related spastic bladder , rec toileting program 11. Constipation. Senna Daily, had 4 incontinent stools yesterday, senna reduced as of 7/27 12.  Type 2 diabetes, on metformin, check CBGs CBG (last 3) ordered but not recorded  Recent Labs  10/23/16 1634  10/23/16 2120 10/24/16 0645  GLUCAP 160* 119* 102*     LOS (Days) 10 A FACE TO FACE EVALUATION WAS PERFORMED  Prajna Vanderpool E 10/24/2016, 8:13 AM

## 2016-10-24 NOTE — Progress Notes (Signed)
Physical Therapy Session Note  Patient Details  Name: Julia Floyd MRN: [id number] Date of Birth: 10/21/1930  Today's Date: 10/24/2016 PT Individual Time: 0803-0900  [id number] PT Individual Time Calculation (min): 57 min AND 40mn  Short Term Goals: Week 2:  PT Short Term Goal 1 (Week 2): Pt will perform bed<>WC transfer with supervision assist and LRAD  PT Short Term Goal 2 (Week 2): Pt will ambulate 166fwith mod assist and LRAD  PT Short Term Goal 3 (Week 2): PT will perform bed mobility with supervision assist consistently PT Short Term Goal 4 (Week 2): pt will perform sit<>stand with min assist and RW.   Skilled Therapeutic Interventions/Progress Updates:   Pt received sitting in WC and agreeable to PT. Pt reports being frustrated due to getting the Wrong breakfast, but willing to go to therapy while waiting for new breakfast.   PT assisted pt to perform cleaning from bed level following incontinent bladder evacutation. Rolling R and L with min-mod assist and heavy use of rails to doff and don brief.   Supine>sit with supervision assist from PT with min cues for sequencing and moderate use of bed features.   Min assist for modified squat pivot transfer [facility] to [facility] cues for set up and improved use of BLE.  PT assisted pt to don pants from [facility] semi-stand technique and Therapist to manage clothing.   WC mobility x [id number] supervision assist and min cues for turring technique to the R and increased use of the LUE.   Seated therex instructed by PT with manual resistance for hip abduction, level 2 tband for hip extension in sitting and AROM SLR. All exercises completed x 12 BLE.   Patient returned to room and [facility] reach and all needs met.     Session 2.  Pt received sitting in WC and agreeable to PT  Transported to Rehab gym in WC>[facility] transfer with mod assist. Pt reports increased fatigue compared to yesterday.    Supine NMR instructed by PT with min cues for improved LLE neuromotor control and decreased compensation through trunk.  Bridges 2 x5.  Heel slides 2 x 12 with pillow case over foot to reduce friction.  Clam shells with manual resistance.  Ankle PF with level 2 tband, 2 x 15.  Hip flexor stretch laying supine 3 x 2mi31mes each.  Bridge 2x5.   Blocked practice sit<>supine with supervision assist from PT. As well as moderate cues for improved technique and LLE management to decrease energy expenditure with transfers.   Semi-stand with Yoga blocks for push through BUE. X 6 with superivsion assist and moderate cues for increased anterior weight shifting.   WC mobility back to room with supervision assist from PT [id number] required to provided min cues in turns for improved use of LUE to maintain momentum.   Patient returned too room and [facility] in reach and all needs met.             Therapy Documentation Precautions:  Precautions Precautions: Fall Precaution Comments: General weakness L>R; dementia Restrictions Weight Bearing Restrictions: No Vital Signs: Therapy Vitals Temp: 98.4 F (36.9 C) Temp Source: Oral Pulse Rate: 75 Resp: 18 BP: (!) 134/46 Patient Position (if appropriate): Lying Oxygen Therapy SpO2: 100 % O2 Device: Not Delivered Pain: 0/10   See Function Navigator for Current Functional Status.   Therapy/Group: Individual Therapy  Lorie Phenix 10/24/2016, 9:00 AM

## 2016-10-24 NOTE — Progress Notes (Signed)
Occupational Therapy Session Note  Patient Details  Name: Julia Floyd MRN: [id number] Date of Birth: Jan 31, 1931  Today's Date: 10/24/2016 OT Individual Time: 1100-1200 OT Individual Time Calculation (min): 60 min    Short Term Goals: Week 2:  OT Short Term Goal 1 (Week 2): STG=LTG due to LOS  Skilled Therapeutic Interventions/Progress Updates:    Pt seen for OT session focusing on functional transfers and LE strengthening. Pt asleep in w/c upon arrival, easily awoken though initially lethargic. Pt agreeable to attempt tx session.  In therapy gym, set-up transfer to therapy mat in simulation of pt's home environment with stand pivot transfer with assist for armrests from chair. Pt completed with min A. She returned to supine on mat and completed hip abduction strengthening exercises from supine and then side-lying position with VCs and demonstration provided for proper form and technique. Rest breaks provided throughout as needs. She returned to [facility] with supervision and increased time. Completed x3 sit <> stand from EOM pulling up on armrests of chair as she does for toileting task. Min A to promote more erect posture. She transitioned back to w/c in same manner as described above.  Pt returned to room in w/c and left with all needs in reach awaiting lunch.   Therapy Documentation Precautions:  Precautions Precautions: Fall Precaution Comments: General weakness L>R; dementia Restrictions Weight Bearing Restrictions: No Pain:   No/ denies pain  See Function Navigator for Current Functional Status.   Therapy/Group: Individual Therapy  Lewis, Azarel Banner C 10/24/2016, 7:04 AM

## 2016-10-24 NOTE — Patient Care Conference (Signed)
Inpatient RehabilitationTeam Conference and Plan of Care Update Date: 10/22/2016   Time: 10:45 AM    Patient Name: Julia Floyd      Medical Record Number: [id number]  Date of Birth: [birth year] Sex: Female         Room/Bed: 4W03C/4W03C-01 Payor Info: Payor: [employer] MEDICARE / Plan: [employer] MEDICARE / Product Type: *No Product type* /    Admitting Diagnosis: CVA  Admit Date/Time:  10/14/2016  6:50 PM Admission Comments: No comment available   Primary Diagnosis:  <principal problem not specified> Principal Problem: <principal problem not specified>  Patient Active Problem List   Diagnosis Date Noted  . Gait disturbance, post-stroke   . Intraparenchymal hemorrhage of brain ([place]) 10/14/2016  . Received tissue plasminogen activator (t-PA) less than 24 hours prior to arrival 10/09/2016  . ICH (intracerebral hemorrhage) ([place]) 10/09/2016  . Cytotoxic brain edema ([place]) 10/09/2016  . Stroke (cerebrum) ([place]) -  Suspect stroke/TIA s/p tPA with multifocal ICH - etiology unclear, concerning for underlying CAA 10/07/2016  . HTN (hypertension)   . Diabetes ([place])   . Depression   . DDD (degenerative disc disease)   . Uterine fibroid   . Trigeminal neuralgia   . Sixth cranial nerve palsy     Expected Discharge Date: Expected Discharge Date: 10/29/16  Team Members Present: Physician leading conference: Dr. Alysia Floyd Social Worker Present: Julia Pall, LCSW Nurse Present:  Julia Sneddon, RN) PT Present: Julia Floyd, PT OT Present: Julia Floyd, OT SLP Present: Julia Floyd, SLP PPS Coordinator present : Julia Nakayama, RN, CRRN     Current Status/Progress Goal Weekly Team Focus  Medical   Patient was in a power wheelchair for several years prior to admission. Per PT. No good reason for this. Working on Ambulance person wheelchair mobility currently.  Improve transfers and wheelchair propulsion, limited ambulation, maintaining medical stability  Improve bladder continence. Start bladder  program   Bowel/Bladder    Incontinent of bowel and bladder.  LBM 7/26.  To prevent incontinent episodes with min A. Time toileting q 2-3 hrs.  Swallow/Nutrition/ Hydration            ADL's   Min-mod A functional transfers; total A toileting and LB dressing; set-up UB bathing/dressing  Supervision-min A overall; have downgraded toileting and LB dressing to total A as this was pt's PLOF  Functional transfers, Neuro re-ed, d/c planning, family education   Mobility   Mod A SB transfer and squat pivot transfer, mod/maxA sit to stand at wall rail or with Stedy. w/c propulsion up to 154ft   Supervision-min assist  Standing tolerance, w/c mobilty, transfers   Communication             Safety/Cognition/ Behavioral Observations            Pain    No complaint of pain. To keep pain levels less than 3. To assess for pain q 2-3 hrs and PRN.  Skin    Skin dry and intact. To keep skin free of pressure sores. To assess skin q shift and PRN.   Rehab Goals Patient on target to meet rehab goals: Yes Rehab Goals Revised: toileting and lower body goals will be downgraded *See Care Plan and progress notes for long and short-term goals.     Barriers to Discharge  Current Status/Progress Possible Resolutions Date Resolved   Physician    Other (comments);Incontinence  Chronic debility,  Progressing towards goals  Continue rehabilitation program      Nursing  PT                    OT                  SLP                SW                Discharge Planning/Teaching Needs:  Pt plans to return home with her dtr and other family to assist, as they were doing before.  Pt's dtr was present day of conference and will receive family education, as needed.   Team Discussion:  Pt doing well medically.  Dr. Letta Pate to try to decrease pt's lasix so that she won't need to urinate as often.  Pt is incontinent of bladder and bowel and staff is trying timed toileting every 2 hours.  Pt is  min to mod A with squat pivot transfers, slide board is min A to supervision.  Family education started.  Pt used power w/c at home and utilized [facility] transportation or family would transport and take manual w/c.  Revisions to Treatment Plan:  none    Continued Need for Acute Rehabilitation Level of Care: The patient requires daily medical management by a physician with specialized training in physical medicine and rehabilitation for the following conditions: Daily direction of a multidisciplinary physical rehabilitation program to ensure safe treatment while eliciting the highest outcome that is of practical value to the patient.: Yes Daily medical management of patient stability for increased activity during participation in an intensive rehabilitation regime.: Yes Daily analysis of laboratory values and/or radiology reports with any subsequent need for medication adjustment of medical intervention for : Neurological problems;Urological problems  Julia Floyd, Julia Floyd 10/24/2016, 7:06 AM

## 2016-10-25 ENCOUNTER — Encounter (HOSPITAL_COMMUNITY): Payer: Self-pay

## 2016-10-25 DIAGNOSIS — E119 Type 2 diabetes mellitus without complications: Secondary | ICD-10-CM

## 2016-10-25 DIAGNOSIS — R32 Unspecified urinary incontinence: Secondary | ICD-10-CM

## 2016-10-25 DIAGNOSIS — M792 Neuralgia and neuritis, unspecified: Secondary | ICD-10-CM

## 2016-10-25 DIAGNOSIS — I10 Essential (primary) hypertension: Secondary | ICD-10-CM

## 2016-10-25 DIAGNOSIS — K5900 Constipation, unspecified: Secondary | ICD-10-CM

## 2016-10-25 DIAGNOSIS — D62 Acute posthemorrhagic anemia: Secondary | ICD-10-CM

## 2016-10-25 LAB — GLUCOSE, CAPILLARY
GLUCOSE-CAPILLARY: 90 mg/dL (ref 65–99)
Glucose-Capillary: 87 mg/dL (ref 65–99)
Glucose-Capillary: 148 mg/dL — ABNORMAL HIGH (ref 65–99)
Glucose-Capillary: 125 mg/dL — ABNORMAL HIGH (ref 65–99)

## 2016-10-25 NOTE — Plan of Care (Signed)
Problem: RH BOWEL ELIMINATION Goal: RH STG MANAGE BOWEL WITH ASSISTANCE STG Manage Bowel with mod  Assistance.  Outcome: Not Progressing Remains incontinent  Problem: RH BLADDER ELIMINATION Goal: RH STG MANAGE BLADDER WITH ASSISTANCE STG Manage Bladder With mod Assistance  Outcome: Not Progressing Incontinent of bladder with total assistance  Problem: RH SKIN INTEGRITY Goal: RH STG MAINTAIN SKIN INTEGRITY WITH ASSISTANCE STG Maintain Skin Integrity With mod Assistance.   Outcome: Not Progressing Total assistance with skin care  Problem: RH SAFETY Goal: RH STG ADHERE TO SAFETY PRECAUTIONS W/ASSISTANCE/DEVICE STG Adhere to Safety Precautions With mod Assistance/Device.  Outcome: Progressing No safety issues noted  Problem: RH PAIN MANAGEMENT Goal: RH STG PAIN MANAGED AT OR BELOW PT'S PAIN GOAL 3 or less  Outcome: Progressing Denies pain

## 2016-10-25 NOTE — Progress Notes (Signed)
Subjective/Complaints: Pt seen laying in bed this AM.  She slept well overnight and is very sleepy this AM.    ROS-Denies CP/SOB, N/V/D  Objective: Vital Signs: Blood pressure 116/67, pulse 72, temperature 98.4 F (36.9 C), temperature source Oral, resp. rate 16, height 5' 6"  (1.676 m), weight 78.5 kg (173 lb), SpO2 100 %. No results found. Results for orders placed or performed during the hospital encounter of 10/14/16 (from the past 72 hour(s))  Basic metabolic panel     Status: Abnormal   Collection Time: 10/22/16 12:07 PM  Result Value Ref Range   Sodium 139 135 - 145 mmol/L   Potassium 4.1 3.5 - 5.1 mmol/L   Chloride 105 101 - 111 mmol/L   CO2 28 22 - 32 mmol/L   Glucose, Bld 114 (H) 65 - 99 mg/dL   BUN 15 6 - 20 mg/dL   Creatinine, Ser 0.81 0.44 - 1.00 mg/dL   Calcium 9.3 8.9 - 10.3 mg/dL   GFR calc non Af Amer >60 >60 mL/min   GFR calc Af Amer >60 >60 mL/min    Comment: (NOTE) The eGFR has been calculated using the CKD EPI equation. This calculation has not been validated in all clinical situations. eGFR's persistently <60 mL/min signify possible Chronic Kidney Disease.    Anion gap 6 5 - 15  Glucose, capillary     Status: Abnormal   Collection Time: 10/23/16 12:06 PM  Result Value Ref Range   Glucose-Capillary 123 (H) 65 - 99 mg/dL  Glucose, capillary     Status: Abnormal   Collection Time: 10/23/16  4:34 PM  Result Value Ref Range   Glucose-Capillary 160 (H) 65 - 99 mg/dL   Comment 1 Notify RN   Glucose, capillary     Status: Abnormal   Collection Time: 10/23/16  9:20 PM  Result Value Ref Range   Glucose-Capillary 119 (H) 65 - 99 mg/dL  Glucose, capillary     Status: Abnormal   Collection Time: 10/24/16  6:45 AM  Result Value Ref Range   Glucose-Capillary 102 (H) 65 - 99 mg/dL  Glucose, capillary     Status: Abnormal   Collection Time: 10/24/16 12:07 PM  Result Value Ref Range   Glucose-Capillary 125 (H) 65 - 99 mg/dL  Glucose, capillary     Status:  Abnormal   Collection Time: 10/24/16  4:54 PM  Result Value Ref Range   Glucose-Capillary 145 (H) 65 - 99 mg/dL  Glucose, capillary     Status: Abnormal   Collection Time: 10/24/16  9:51 PM  Result Value Ref Range   Glucose-Capillary 159 (H) 65 - 99 mg/dL   Comment 1 Notify RN   Glucose, capillary     Status: None   Collection Time: 10/25/16  6:56 AM  Result Value Ref Range   Glucose-Capillary 87 65 - 99 mg/dL   Comment 1 Notify RN      HEENT: Normocephalic, atraumatic Cardio: RRR and no JVD Resp: CTA B/L and unlabored GI: BS positive ND Skin:   Intact, Warm and dry Neuro: Alert and Oriented Motor 4/5 BUE, 4- RLE, 3- LLE,  Musc/Skel:  No edema, no tenderness Gen NAD. Vital signs reivewed.   Assessment/Plan: 1. 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 Physiatrist is providing close team supervision and 24 hour management of active medical problems listed below. Physiatrist and rehab team continue to assess barriers to discharge/monitor patient  progress toward functional and medical goals. FIM: Function - Bathing Position: Wheelchair/chair at sink Body parts bathed by patient: Right arm, Left arm, Chest, Abdomen, Left upper leg, Right upper leg, Front perineal area, Right lower leg, Left lower leg Body parts bathed by helper: Back, Buttocks Assist Level: Touching or steadying assistance(Pt > 75%)  Function- Upper Body Dressing/Undressing What is the patient wearing?: Pull over shirt/dress Pull over shirt/dress - Perfomed by patient: Thread/unthread right sleeve, Thread/unthread left sleeve, Put head through opening Pull over shirt/dress - Perfomed by helper: Pull shirt over trunk Assist Level: Touching or steadying assistance(Pt > 75%) Function - Lower Body Dressing/Undressing What is the patient wearing?: Non-skid slipper socks, Pants, Shoes Position: Other  (comment) (w/c and stedy ) Pants- Performed by helper: Thread/unthread right pants leg, Thread/unthread left pants leg, Pull pants up/down Non-skid slipper socks- Performed by helper: Don/doff right sock, Don/doff left sock Shoes - Performed by helper: Don/doff right shoe, Don/doff left shoe, Fasten right, Fasten left Assist for footwear: Maximal assist Assist for lower body dressing:  (totalA)  Function - Toileting Toileting steps completed by helper: Adjust clothing prior to toileting, Performs perineal hygiene, Adjust clothing after toileting Toileting Assistive Devices: Grab bar or rail Assist level: More than reasonable time  Function Midwife transfer assistive device: Elevated toilet seat/BSC over toilet Mechanical lift: Stedy Assist level to toilet: Maximal assist (Pt 25 - 49%/lift and lower) Assist level from toilet: Maximal assist (Pt 25 - 49%/lift and lower)  Function - Chair/bed transfer Chair/bed transfer method: Squat pivot Chair/bed transfer assist level: Moderate assist (Pt 50 - 74%/lift or lower) Chair/bed transfer assistive device: Armrests Mechanical lift: Other Chair/bed transfer details: Verbal cues for technique, Verbal cues for sequencing, Tactile cues for weight bearing, Tactile cues for weight shifting, Tactile cues for posture, Manual facilitation for weight shifting  Function - Locomotion: Wheelchair Type: Manual Max wheelchair distance: 129f  Assist Level: Supervision or verbal cues Assist Level: Supervision or verbal cues Wheel 150 feet activity did not occur: Safety/medical concerns Assist Level: Supervision or verbal cues Function - Locomotion: Ambulation Assistive device: Parallel bars Max distance: 8 Assist level: 2 helpers Walk 10 feet activity did not occur: Safety/medical concerns Walk 50 feet with 2 turns activity did not occur: Safety/medical concerns Walk 150 feet activity did not occur: Safety/medical concerns Walk 10  feet on uneven surfaces activity did not occur: Safety/medical concerns  Function - Comprehension Comprehension: Auditory Comprehension assist level: Understands basic 90% of the time/cues < 10% of the time  Function - Expression Expression: Verbal Expression assist level: Expresses basic needs/ideas: With no assist  Function - Social Interaction Social Interaction assist level: Interacts appropriately 75 - 89% of the time - Needs redirection for appropriate language or to initiate interaction.  Function - Problem Solving Problem solving assist level: Solves basic 90% of the time/requires cueing < 10% of the time  Function - Memory Memory assist level: Recognizes or recalls 75 - 89% of the time/requires cueing 10 - 24% of the time Medical Problem List and Plan: 1. Decreased functional mobility with aphasiasecondary to multifocal intraparenchymal hemorrhages  Cont CIR  Notes reviewed, images reviewed 2. DVT Prophylaxis/Anticoagulation: SCDs.  3. Pain Management/trigeminal neuralgia: Resume Neurontin 100-200 mg at bedtime as needed- no change at present 4. Mood/Alzheimer's dementia: Provide emotional support.[place]opewith family 5. Neuropsych: This patient iscapable of making decisions on herown behalf. 6. Skin/Wound Care: Routine skin checks, foam prophyllactic sacral dressing 7. Fluids/Electrolytes/Nutrition: Routine I&O's  Alb low on  prostat, BUN and creatinine normal on 10/22/2016, ?trending up, labs ordered for Monday 8.Hypertension.Norvasc 10 mg daily,  reduced Lasix to 20 mg 10/22/2016.   Blood pressures controlled on 7/28 Vitals:   10/24/16 1555 10/25/16 0401  BP: (!) 104/43 116/67  Pulse: 71 72  Resp: 18 16  Temp: 98.3 F (36.8 C) 98.4 F (36.9 C)    9Hyperlipidemia. Pravachol 10.  Urinary incont - had some problems PTA no dysuria, no fever may be CVA related spastic bladder , rec toileting program 11. Constipation. Senna Daily reduced on 7/27 12.   Type 2 diabetes, on metformin, check CBGs CBG (last 3)   Recent Labs  10/24/16 1654 10/24/16 2151 10/25/16 0656  GLUCAP 145* 159* 87   Labile, but overall controlled 7/28 13. ABLA  Hb 8.5 on 7/18  Labs ordered for Monday  LOS (Days) 11 A FACE TO FACE EVALUATION WAS PERFORMED  Chaden Doom Lorie Phenix 10/25/2016, 9:32 AM

## 2016-10-26 ENCOUNTER — Inpatient Hospital Stay (HOSPITAL_COMMUNITY): Payer: Medicare Other | Admitting: Occupational Therapy

## 2016-10-26 LAB — GLUCOSE, CAPILLARY
GLUCOSE-CAPILLARY: 95 mg/dL (ref 65–99)
GLUCOSE-CAPILLARY: 98 mg/dL (ref 65–99)
Glucose-Capillary: 136 mg/dL — ABNORMAL HIGH (ref 65–99)
Glucose-Capillary: 130 mg/dL — ABNORMAL HIGH (ref 65–99)

## 2016-10-26 NOTE — Progress Notes (Signed)
Subjective/Complaints: Patient seen lying in bed this morning. She states she slept fairly overnight because she was tossing and turning. She asks me to pray for her.  ROS: Denies CP/SOB, N/V/D  Objective: Vital Signs: Blood pressure (!) 125/37, pulse 74, temperature 99 F (37.2 C), temperature source Oral, resp. rate 18, height 5\' 6"  (1.676 m), weight 78.5 kg (173 lb), SpO2 99 %. No results found. Results for orders placed or performed during the hospital encounter of 10/14/16 (from the past 72 hour(s))  Glucose, capillary     Status: Abnormal   Collection Time: 10/23/16 12:06 PM  Result Value Ref Range   Glucose-Capillary 123 (H) 65 - 99 mg/dL  Glucose, capillary     Status: Abnormal   Collection Time: 10/23/16  4:34 PM  Result Value Ref Range   Glucose-Capillary 160 (H) 65 - 99 mg/dL   Comment 1 Notify RN   Glucose, capillary     Status: Abnormal   Collection Time: 10/23/16  9:20 PM  Result Value Ref Range   Glucose-Capillary 119 (H) 65 - 99 mg/dL  Glucose, capillary     Status: Abnormal   Collection Time: 10/24/16  6:45 AM  Result Value Ref Range   Glucose-Capillary 102 (H) 65 - 99 mg/dL  Glucose, capillary     Status: Abnormal   Collection Time: 10/24/16 12:07 PM  Result Value Ref Range   Glucose-Capillary 125 (H) 65 - 99 mg/dL  Glucose, capillary     Status: Abnormal   Collection Time: 10/24/16  4:54 PM  Result Value Ref Range   Glucose-Capillary 145 (H) 65 - 99 mg/dL  Glucose, capillary     Status: Abnormal   Collection Time: 10/24/16  9:51 PM  Result Value Ref Range   Glucose-Capillary 159 (H) 65 - 99 mg/dL   Comment 1 Notify RN   Glucose, capillary     Status: None   Collection Time: 10/25/16  6:56 AM  Result Value Ref Range   Glucose-Capillary 87 65 - 99 mg/dL   Comment 1 Notify RN   Glucose, capillary     Status: None   Collection Time: 10/25/16 12:05 PM  Result Value Ref Range   Glucose-Capillary 90 65 - 99 mg/dL  Glucose, capillary     Status: Abnormal    Collection Time: 10/25/16  4:43 PM  Result Value Ref Range   Glucose-Capillary 148 (H) 65 - 99 mg/dL  Glucose, capillary     Status: Abnormal   Collection Time: 10/25/16  8:49 PM  Result Value Ref Range   Glucose-Capillary 125 (H) 65 - 99 mg/dL  Glucose, capillary     Status: None   Collection Time: 10/26/16  6:29 AM  Result Value Ref Range   Glucose-Capillary 95 65 - 99 mg/dL     HEENT: Normocephalic, atraumatic Cardio: RRR and no JVD Resp: CTA B/L and Unlabored GI: BS positive ND Skin:   Intact, Warm and dry Neuro: Alert and Oriented Motor 4/5 BUE, 4-/5 RLE, 3-/5 LLE   Musc/Skel:  No edema, no tenderness Gen NAD. Vital signs reivewed.   Assessment/Plan: 1. 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 Physiatrist is providing close team supervision and 24 hour management of active medical problems listed below. Physiatrist and rehab team continue to assess barriers to discharge/monitor patient progress toward functional and medical goals. FIM: Function - Bathing Position: Wheelchair/chair at sink Body parts bathed by patient: Right arm,  Left arm, Chest, Abdomen, Left upper leg, Right upper leg, Front perineal area, Right lower leg, Left lower leg Body parts bathed by helper: Back, Buttocks Assist Level: Touching or steadying assistance(Pt > 75%)  Function- Upper Body Dressing/Undressing What is the patient wearing?: Pull over shirt/dress Pull over shirt/dress - Perfomed by patient: Thread/unthread right sleeve, Thread/unthread left sleeve, Put head through opening Pull over shirt/dress - Perfomed by helper: Pull shirt over trunk Assist Level: Touching or steadying assistance(Pt > 75%) Function - Lower Body Dressing/Undressing What is the patient wearing?: Non-skid slipper socks, Pants, Shoes Position: Other (comment) (w/c and stedy ) Pants- Performed by helper:  Thread/unthread right pants leg, Thread/unthread left pants leg, Pull pants up/down Non-skid slipper socks- Performed by helper: Don/doff right sock, Don/doff left sock Shoes - Performed by helper: Don/doff right shoe, Don/doff left shoe, Fasten right, Fasten left Assist for footwear: Maximal assist Assist for lower body dressing:  (totalA)  Function - Toileting Toileting steps completed by helper: Adjust clothing prior to toileting, Performs perineal hygiene, Adjust clothing after toileting Toileting Assistive Devices: Grab bar or rail Assist level: Two helpers  Function - Air cabin crew transfer assistive device: Elevated toilet seat/BSC over toilet Mechanical lift: Stedy Assist level to toilet: 2 helpers Assist level from toilet: 2 helpers  Function - Chair/bed transfer Chair/bed transfer method: Squat pivot Chair/bed transfer assist level: Moderate assist (Pt 50 - 74%/lift or lower) Chair/bed transfer assistive device: Armrests Mechanical lift: Other Chair/bed transfer details: Verbal cues for technique, Verbal cues for sequencing, Tactile cues for weight bearing, Tactile cues for weight shifting, Tactile cues for posture, Manual facilitation for weight shifting  Function - Locomotion: Wheelchair Type: Manual Max wheelchair distance: 134ft  Assist Level: Supervision or verbal cues Assist Level: Supervision or verbal cues Wheel 150 feet activity did not occur: Safety/medical concerns Assist Level: Supervision or verbal cues Function - Locomotion: Ambulation Assistive device: Parallel bars Max distance: 8 Assist level: 2 helpers Walk 10 feet activity did not occur: Safety/medical concerns Walk 50 feet with 2 turns activity did not occur: Safety/medical concerns Walk 150 feet activity did not occur: Safety/medical concerns Walk 10 feet on uneven surfaces activity did not occur: Safety/medical concerns  Function - Comprehension Comprehension: Auditory Comprehension  assist level: Understands basic 90% of the time/cues < 10% of the time  Function - Expression Expression: Verbal Expression assist level: Expresses basic 90% of the time/requires cueing < 10% of the time.  Function - Social Interaction Social Interaction assist level: Interacts appropriately 90% of the time - Needs monitoring or encouragement for participation or interaction.  Function - Problem Solving Problem solving assist level: Solves complex 90% of the time/cues < 10% of the time  Function - Memory Memory assist level: Recognizes or recalls 90% of the time/requires cueing < 10% of the time Medical Problem List and Plan: 1. Decreased functional mobility with aphasiasecondary to multifocal intraparenchymal hemorrhages  Cont CIR 2. DVT Prophylaxis/Anticoagulation: SCDs.  3. Pain Management/trigeminal neuralgia: Resume Neurontin 100-200 mg at bedtime as needed- no change at present 4. Mood/Alzheimer's dementia: Provide emotional support.[place] with family 5. Neuropsych: This patient iscapable of making decisions on herown behalf. 6. Skin/Wound Care: Routine skin checks, foam prophyllactic sacral dressing 7. Fluids/Electrolytes/Nutrition: Routine I&O's  Alb low on prostat, BUN and creatinine normal on 10/22/2016, ?trending up, labs ordered for Monday 8.Hypertension.Norvasc 10 mg daily,  reduced Lasix to 20 mg 10/22/2016.   Blood pressures controlled on 7/29 Vitals:   10/25/16 1300 10/26/16 0410  BP: Marland Kitchen)  105/38 (!) 125/37  Pulse: 75 74  Resp: 16 18  Temp: 98.4 F (36.9 C) 99 F (37.2 C)    9Hyperlipidemia. Pravachol 10.  Urinary incont - had some problems PTA no dysuria, no fever may be CVA related spastic bladder , rec toileting program 11. Constipation. Senna Daily reduced on 7/27 12.  Type 2 diabetes, on metformin, check CBGs CBG (last 3)   Recent Labs  10/25/16 1643 10/25/16 2049 10/26/16 0629  GLUCAP 148* 125* 95   Labile, but overall controlled  7/29 13. ABLA  Hb 8.5 on 7/18  Labs ordered for Monday  LOS (Days) 12 A FACE TO FACE EVALUATION WAS PERFORMED  Elfida Shimada Lorie Phenix 10/26/2016, 8:06 AM

## 2016-10-26 NOTE — Progress Notes (Signed)
Occupational Therapy Session Note  Patient Details  Name: Julia Floyd MRN: [id number] Date of Birth: [birth year]  Today's Date: 10/26/2016 OT Individual Time: 0930-1008 OT Individual Time Calculation (min): 38 min    Short Term Goals: Week 2:  OT Short Term Goal 1 (Week 2): STG=LTG due to LOS  Skilled Therapeutic Interventions/Progress Updates:    Pt seen for OT ADL bathing/dressing session. Pt asleep in w/c upon arrival, easily awoken anda greeable to tx session, denying pain this morning. She completed UB bathing at sink with set-up assist. She was able to reach to ankles to wash LEs, requiring assist to doff socks and wash feet. She stood at sink with mod A while total A provided to complete buttock hygiene and clothing management, tolerating ~30 seconds each standing trial. She completed oral hygiene at sink, requiring increased time and trials for fine motor tasks. Pt left seated in w/c at end of session, all needs in reach.   Therapy Documentation Precautions:  Precautions Precautions: Fall Precaution Comments: General weakness L>R; dementia Restrictions Weight Bearing Restrictions: No Pain:   No/ denies pain  See Function Navigator for Current Functional Status.   Therapy/Group: Individual Therapy  Lewis, Rina Adney C 10/26/2016, 6:39 AM

## 2016-10-27 ENCOUNTER — Inpatient Hospital Stay (HOSPITAL_COMMUNITY): Payer: Medicare Other | Admitting: Occupational Therapy

## 2016-10-27 ENCOUNTER — Inpatient Hospital Stay (HOSPITAL_COMMUNITY): Payer: Medicare Other | Admitting: Physical Therapy

## 2016-10-27 LAB — CBC
HEMATOCRIT: 30.6 % — AB (ref 36.0–46.0)
HEMOGLOBIN: 9.6 g/dL — AB (ref 12.0–15.0)
MCH: 29.8 pg (ref 26.0–34.0)
MCHC: 31.4 g/dL (ref 30.0–36.0)
MCV: 95 fL (ref 78.0–100.0)
Platelets: 182 10*3/uL (ref 150–400)
RBC: 3.22 MIL/uL — ABNORMAL LOW (ref 3.87–5.11)
RDW: 16.1 % — AB (ref 11.5–15.5)
WBC: 5.4 10*3/uL (ref 4.0–10.5)

## 2016-10-27 LAB — GLUCOSE, CAPILLARY
GLUCOSE-CAPILLARY: 101 mg/dL — AB (ref 65–99)
GLUCOSE-CAPILLARY: 183 mg/dL — AB (ref 65–99)
Glucose-Capillary: 90 mg/dL (ref 65–99)
Glucose-Capillary: 114 mg/dL — ABNORMAL HIGH (ref 65–99)

## 2016-10-27 NOTE — Progress Notes (Signed)
Physical Therapy Session Note  Patient Details  Name: Julia Floyd MRN: [id number] Date of Birth: 1931-01-03  Today's Date: 10/27/2016 PT Individual Time: 0900-1015 PT Individual Time Calculation (min): 75 min   Short Term Goals: Week 2:  PT Short Term Goal 1 (Week 2): Pt will perform bed<>WC transfer with supervision assist and LRAD  PT Short Term Goal 2 (Week 2): Pt will ambulate 74ft with mod assist and LRAD  PT Short Term Goal 3 (Week 2): PT will perform bed mobility with supervision assist consistently PT Short Term Goal 4 (Week 2): pt will perform sit<>stand with min assist and RW.   Skilled Therapeutic Interventions/Progress Updates: Pt presented in bed agreeable to therapy. Performed supine to sit with min guard and increased time. Pt noted to have urinary incontinence. Performed sit to stand with minA and use of Stedy x 2 for peri-hygiene and donning LB clothing. Performed modified squat pivot to R with minA to w/c. Transported pt to rehab gym for time management. Performed squat pivot to R minA elevated bed which pt indicating is similar height to own bed. Performed dynamic sitting balance playing catch 2 x 4min. Performed sit to stand from elevated surface x 2 with modA and max cues for improving erect posture. Pt performed modified squat pivot to w/c with minA and propelled to room 127ft with minA due to increasing L drift which pt could correct with mod verbal cues. Pt remained in w/c at end of session with call bell within reach and dgts present.      Therapy Documentation Precautions:  Precautions Precautions: Fall Precaution Comments: General weakness L>R; dementia Restrictions Weight Bearing Restrictions: No General:   Vital Signs: Therapy Vitals Temp: 98.4 F (36.9 C) Temp Source: Oral Pulse Rate: 78 Resp: 16 BP: (!) 112/40 Patient Position (if appropriate): Sitting Oxygen Therapy SpO2: 100 % O2 Device: Not Delivered   See Function Navigator for  Current Functional Status.   Therapy/Group: Individual Therapy  [facility] Hands  Mccall Lomax, PTA  10/27/2016, 4:38 PM

## 2016-10-27 NOTE — Progress Notes (Signed)
Occupational Therapy Session Note  Patient Details  Name: Julia Floyd MRN: [id number] Date of Birth: 1931-03-01  Today's Date: 10/27/2016 OT Individual Time: 1300-1353 OT Individual Time Calculation (min): 53 min    Short Term Goals: Week 2:  OT Short Term Goal 1 (Week 2): STG=LTG due to LOS  Skilled Therapeutic Interventions/Progress Updates:    Pt received sitting up in w/c agreeable to OT tx session. Focus of session on ADL retraining and functional mobility transfers. Pt practiced completing LB dressing in preparation for completing task with increased independence after return home. Pt completed threading bil LEs into pants x2 trials, with increased time Pt was able to complete with mod verbal cues and minA first trial, minGuard assist to thread LEs during second trial. Pt completed sit<>stand at sink with ModA, requires totalA for advancing pants over hips as Pt requires bil UE support to maintain standing balance. Pt able to tolerate standing approx 30 seconds during 2 trials of sit<>stand. Pt with need to complete toileting, completed stand pivot transfer w/c<>BSC over toilet using grab bars with overall MaxA, TotalA for clothing management and perihygiene after urinating with multiple sit<>stands to complete as Pt with increased fatigue/decreased standing tolerance. Ended session with Pt seated in w/c, call bell and needs within reach.   Therapy Documentation Precautions:  Precautions Precautions: Fall Precaution Comments: General weakness L>R; dementia Restrictions Weight Bearing Restrictions: No   Pain: Pain Assessment Pain Assessment: No/denies pain  See Function Navigator for Current Functional Status.   Therapy/Group: Individual Therapy  Raymondo Band 10/27/2016, 5:07 PM

## 2016-10-27 NOTE — Progress Notes (Signed)
Subjective/Complaints:  Patient has had some constipation, received laxative yesterday, but no results thus far. Discussed with RN Patient aware of discharge date on Wednesday  No pain complaints  ROS: Denies CP/SOB, N/V/D  Objective: Vital Signs: Blood pressure (!) 140/56, pulse 96, temperature 98.7 F (37.1 C), temperature source Oral, resp. rate 16, height 5\' 6"  (1.676 m), weight 78.5 kg (173 lb), SpO2 100 %. No results found. Results for orders placed or performed during the hospital encounter of 10/14/16 (from the past 72 hour(s))  Glucose, capillary     Status: Abnormal   Collection Time: 10/24/16 12:07 PM  Result Value Ref Range   Glucose-Capillary 125 (H) 65 - 99 mg/dL  Glucose, capillary     Status: Abnormal   Collection Time: 10/24/16  4:54 PM  Result Value Ref Range   Glucose-Capillary 145 (H) 65 - 99 mg/dL  Glucose, capillary     Status: Abnormal   Collection Time: 10/24/16  9:51 PM  Result Value Ref Range   Glucose-Capillary 159 (H) 65 - 99 mg/dL   Comment 1 Notify RN   Glucose, capillary     Status: None   Collection Time: 10/25/16  6:56 AM  Result Value Ref Range   Glucose-Capillary 87 65 - 99 mg/dL   Comment 1 Notify RN   Glucose, capillary     Status: None   Collection Time: 10/25/16 12:05 PM  Result Value Ref Range   Glucose-Capillary 90 65 - 99 mg/dL  Glucose, capillary     Status: Abnormal   Collection Time: 10/25/16  4:43 PM  Result Value Ref Range   Glucose-Capillary 148 (H) 65 - 99 mg/dL  Glucose, capillary     Status: Abnormal   Collection Time: 10/25/16  8:49 PM  Result Value Ref Range   Glucose-Capillary 125 (H) 65 - 99 mg/dL  Glucose, capillary     Status: None   Collection Time: 10/26/16  6:29 AM  Result Value Ref Range   Glucose-Capillary 95 65 - 99 mg/dL  Glucose, capillary     Status: Abnormal   Collection Time: 10/26/16 11:39 AM  Result Value Ref Range   Glucose-Capillary 136 (H) 65 - 99 mg/dL  Glucose, capillary     Status: None    Collection Time: 10/26/16  5:10 PM  Result Value Ref Range   Glucose-Capillary 98 65 - 99 mg/dL  Glucose, capillary     Status: Abnormal   Collection Time: 10/26/16  9:05 PM  Result Value Ref Range   Glucose-Capillary 130 (H) 65 - 99 mg/dL  Glucose, capillary     Status: Abnormal   Collection Time: 10/27/16  6:44 AM  Result Value Ref Range   Glucose-Capillary 101 (H) 65 - 99 mg/dL     HEENT: Normocephalic, atraumatic Cardio: RRR and no JVD Resp: CTA B/L and Unlabored GI: BS positive ND Skin:   Intact, Warm and dry Neuro: Alert and Oriented Motor 4/5 BUE, 4-/5 RLE, 3+/5 LLE   Musc/Skel:  No edema, no tenderness Gen NAD. Vital signs reivewed.   Assessment/Plan: 1. 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 Physiatrist is providing close team supervision and 24 hour management of active medical problems listed below. Physiatrist and rehab team continue to assess barriers to discharge/monitor patient progress toward functional and medical goals. FIM: Function - Bathing Position: Wheelchair/chair at sink Body parts bathed by patient: Right arm, Left arm, Chest, Abdomen, Left upper  leg, Right upper leg, Front perineal area, Right lower leg, Left lower leg Body parts bathed by helper: Back, Buttocks Assist Level: Touching or steadying assistance(Pt > 75%)  Function- Upper Body Dressing/Undressing What is the patient wearing?: Pull over shirt/dress Pull over shirt/dress - Perfomed by patient: Thread/unthread right sleeve, Thread/unthread left sleeve, Put head through opening Pull over shirt/dress - Perfomed by helper: Pull shirt over trunk Assist Level: Touching or steadying assistance(Pt > 75%) Function - Lower Body Dressing/Undressing What is the patient wearing?: Non-skid slipper socks, Pants, Shoes Position: Other (comment) (w/c and stedy ) Pants- Performed by  helper: Thread/unthread right pants leg, Thread/unthread left pants leg, Pull pants up/down Non-skid slipper socks- Performed by helper: Don/doff right sock, Don/doff left sock Shoes - Performed by helper: Don/doff right shoe, Don/doff left shoe, Fasten right, Fasten left Assist for footwear: Maximal assist Assist for lower body dressing:  (totalA)  Function - Toileting Toileting steps completed by helper: Adjust clothing prior to toileting, Performs perineal hygiene, Adjust clothing after toileting Toileting Assistive Devices: Toilet aid Assist level: Two helpers  Function - Air cabin crew transfer assistive device: Facilities manager lift: Stedy Assist level to toilet: 2 helpers Assist level from toilet: 2 helpers  Function - Chair/bed transfer Chair/bed transfer [place]: Squat pivot Chair/bed transfer assist level: Moderate assist (Pt 50 - 74%/lift or lower) Chair/bed transfer assistive device: Armrests Mechanical lift: Other Chair/bed transfer details: Verbal cues for technique, Verbal cues for sequencing, Tactile cues for weight bearing, Tactile cues for weight shifting, Tactile cues for posture, Manual facilitation for weight shifting  Function - Locomotion: Wheelchair Type: Manual Max wheelchair distance: 112ft  Assist Level: Supervision or verbal cues Assist Level: Supervision or verbal cues Wheel 150 feet activity did not occur: Safety/medical concerns Assist Level: Supervision or verbal cues Function - Locomotion: Ambulation Assistive device: Parallel bars Max distance: 8 Assist level: 2 helpers Walk 10 feet activity did not occur: Safety/medical concerns Walk 50 feet with 2 turns activity did not occur: Safety/medical concerns Walk 150 feet activity did not occur: Safety/medical concerns Walk 10 feet on uneven surfaces activity did not occur: Safety/medical concerns  Function - Comprehension Comprehension: Auditory Comprehension assist level:  Understands basic 90% of the time/cues < 10% of the time  Function - Expression Expression: Verbal Expression assist level: Expresses basic 90% of the time/requires cueing < 10% of the time.  Function - Social Interaction Social Interaction assist level: Interacts appropriately 90% of the time - Needs monitoring or encouragement for participation or interaction.  Function - Problem Solving Problem solving assist level: Solves basic 90% of the time/requires cueing < 10% of the time  Function - Memory Memory assist level: Recognizes or recalls 90% of the time/requires cueing < 10% of the time Medical Problem List and Plan: 1. Decreased functional mobility with aphasiasecondary to multifocal intraparenchymal hemorrhages  Cont CIR, planned discharge on Wednesday 2. DVT Prophylaxis/Anticoagulation: SCDs.  3. Pain Management/trigeminal neuralgia: Resume Neurontin 100-200 mg at bedtime as needed- no change at present 4. Mood/Alzheimer's dementia: Provide emotional support.[place] with family 5. Neuropsych: This patient iscapable of making decisions on herown behalf. 6. Skin/Wound Care: Routine skin checks, foam prophyllactic sacral dressing 7. Fluids/Electrolytes/Nutrition: Routine I&O's  Alb low on prostat, BUN and creatinine normal on 10/22/2016, ?trending up, labs ordered for Monday 8.Hypertension.Norvasc 10 mg daily,  reduced Lasix to 20 mg 10/22/2016.   Blood pressures controlled on 7/30 Vitals:   10/26/16 1352 10/27/16 0410  BP: (!) 118/57 (!) 140/56  Pulse: 73  96  Resp: 18 16  Temp: 98.2 F (36.8 C) 98.7 F (37.1 C)    9Hyperlipidemia. Pravachol 10.  Urinary incont - had some problems PTA no dysuria, no fever may be CVA related spastic bladder , rec toileting program 11. Constipation. Senna Daily reduced on 7/27 12.  Type 2 diabetes, on metformin, check CBGs CBG (last 3)   Recent Labs  10/26/16 1710 10/26/16 2105 10/27/16 0644  GLUCAP 98 130* 101*    Labile, but overall controlled 7/30 13. ABLA  Hb 8.5 on 7/18, repeat ordered    LOS (Days) 13 A FACE TO FACE EVALUATION WAS PERFORMED  Alesha Jaffee E 10/27/2016, 8:52 AM

## 2016-10-27 NOTE — Progress Notes (Signed)
Occupational Therapy Session Note  Patient Details  Name: Julia Floyd MRN: [id number] Date of Birth: 12/10/1930  Today's Date: 10/27/2016 OT Individual Time: 1100-1200  And 1430-1455 OT Individual Time Calculation (min): 60 min and 25 min   Short Term Goals: Week 2:  OT Short Term Goal 1 (Week 2): STG=LTG due to LOS  Skilled Therapeutic Interventions/Progress Updates:    Session One: Pt seen for OT session focusing on ADL re-training and family education with functional transfers. Pt sitting up in w/c upon arrival with daughter's present. Pt's daughter's trying to problem solve how to wash pt's hair. They declined showering task since she does not shower at home. Therefore, educated regarding set-up of w/c by sink and pt's hair washed total A. While completing this, discussed d/c planning, pt's progress, PLOF vs CLOF, and home set-up.  Pt taken to therapy gym in w/c total A for time and energy conservation. Pt's daughter's present and worked on transfers in [place] she did PTA, completing stand pivot transfers with use of arm rests from standard char/ BSC. Pt completed x4 transfers in this method with guarding assist. Recommendations made for pt to have min A for transfers and for pt to stand with B UE support while caregiver provided total A for toileting tasks, all family voiced understanding and pt's daughter return demonstrated min A for transfer. Pt self propelled w/c~71ft back towards room for UE strengthening/endurance, pt left seated in w/c with all needs in reach and family present.  Family reports pt is approaching baseline level and feeling comfortable and able to provide needed assist at d/c.   Session Two: Pt seen for OT session focusing on UE strengthening/ endurance and standing balance/ endurance. Pt sitting up in w/c upon arrival, voicing fatigue, however, agreeable to tx session.  In therapy gym, completed x5 minutes on UE SCI FIT for UE strengthening/ endurance.   Following rest break, pt completed x2 standing trials using chair placed in front of pt to pull up on in simulation of home set-up/ technique. Pt tolerated 1 minute 20 seconds of standing on first trial before requiring seated rest break. Second trial pt tolerated 30 seconds with trial emphasizing more upright/ erect posture and min A required. Pt returned to room at end of session, left seated in w/c with all needs in reach.  Discussed at length d/c planning including various transfers completed beyond bed<>w/c and w/c <> BSC. Pt denied transferring to couch or public toilets as she did not do this PTA and voiced feeling comfortable with the transfers we completed during AM session.   Therapy Documentation Precautions:  Precautions Precautions: Fall Precaution Comments: General weakness L>R; dementia Restrictions Weight Bearing Restrictions: No Pain:    No/ denies pain  See Function Navigator for Current Functional Status.   Therapy/Group: Individual Therapy  Lewis, Haani Bakula C 10/27/2016, 7:22 AM

## 2016-10-28 ENCOUNTER — Inpatient Hospital Stay (HOSPITAL_COMMUNITY): Payer: Medicare Other | Admitting: Occupational Therapy

## 2016-10-28 ENCOUNTER — Inpatient Hospital Stay (HOSPITAL_COMMUNITY): Payer: Medicare Other | Admitting: Physical Therapy

## 2016-10-28 DIAGNOSIS — N3941 Urge incontinence: Secondary | ICD-10-CM

## 2016-10-28 LAB — GLUCOSE, CAPILLARY
GLUCOSE-CAPILLARY: 86 mg/dL (ref 65–99)
GLUCOSE-CAPILLARY: 128 mg/dL — AB (ref 65–99)
Glucose-Capillary: 174 mg/dL — ABNORMAL HIGH (ref 65–99)
Glucose-Capillary: 139 mg/dL — ABNORMAL HIGH (ref 65–99)

## 2016-10-28 NOTE — Progress Notes (Signed)
Subjective/Complaints:  Aware of D/C in am Per RN had one episode of urinary retention but PVR was 271ml this am    ROS: Denies CP/SOB, N/V/D  Objective: Vital Signs: Blood pressure (!) 124/47, pulse 65, temperature 98.8 F (37.1 C), temperature source Oral, resp. rate 16, height 5\' 6"  (1.676 m), weight 78.5 kg (173 lb), SpO2 100 %. No results found. Results for orders placed or performed during the hospital encounter of 10/14/16 (from the past 72 hour(s))  Glucose, capillary     Status: None   Collection Time: 10/25/16 12:05 PM  Result Value Ref Range   Glucose-Capillary 90 65 - 99 mg/dL  Glucose, capillary     Status: Abnormal   Collection Time: 10/25/16  4:43 PM  Result Value Ref Range   Glucose-Capillary 148 (H) 65 - 99 mg/dL  Glucose, capillary     Status: Abnormal   Collection Time: 10/25/16  8:49 PM  Result Value Ref Range   Glucose-Capillary 125 (H) 65 - 99 mg/dL  Glucose, capillary     Status: None   Collection Time: 10/26/16  6:29 AM  Result Value Ref Range   Glucose-Capillary 95 65 - 99 mg/dL  Glucose, capillary     Status: Abnormal   Collection Time: 10/26/16 11:39 AM  Result Value Ref Range   Glucose-Capillary 136 (H) 65 - 99 mg/dL  Glucose, capillary     Status: None   Collection Time: 10/26/16  5:10 PM  Result Value Ref Range   Glucose-Capillary 98 65 - 99 mg/dL  Glucose, capillary     Status: Abnormal   Collection Time: 10/26/16  9:05 PM  Result Value Ref Range   Glucose-Capillary 130 (H) 65 - 99 mg/dL  Glucose, capillary     Status: Abnormal   Collection Time: 10/27/16  6:44 AM  Result Value Ref Range   Glucose-Capillary 101 (H) 65 - 99 mg/dL  CBC     Status: Abnormal   Collection Time: 10/27/16 11:16 AM  Result Value Ref Range   WBC 5.4 4.0 - 10.5 K/uL   RBC 3.22 (L) 3.87 - 5.11 MIL/uL   Hemoglobin 9.6 (L) 12.0 - 15.0 g/dL   HCT 30.6 (L) 36.0 - 46.0 %   MCV 95.0 78.0 - 100.0 fL   MCH 29.8 26.0 - 34.0 pg   MCHC 31.4 30.0 - 36.0 g/dL   RDW  16.1 (H) 11.5 - 15.5 %   Platelets 182 150 - 400 K/uL  Glucose, capillary     Status: None   Collection Time: 10/27/16 12:24 PM  Result Value Ref Range   Glucose-Capillary 90 65 - 99 mg/dL  Glucose, capillary     Status: Abnormal   Collection Time: 10/27/16  4:24 PM  Result Value Ref Range   Glucose-Capillary 183 (H) 65 - 99 mg/dL  Glucose, capillary     Status: Abnormal   Collection Time: 10/27/16  9:36 PM  Result Value Ref Range   Glucose-Capillary 114 (H) 65 - 99 mg/dL   Comment 1 Notify RN   Glucose, capillary     Status: None   Collection Time: 10/28/16  5:40 AM  Result Value Ref Range   Glucose-Capillary 86 65 - 99 mg/dL   Comment 1 Notify RN      HEENT: Normocephalic, atraumatic Cardio: RRR and no JVD Resp: CTA B/L and Unlabored GI: BS positive ND Skin:   Intact, Warm and dry Neuro: Alert and Oriented Motor 4/5 BUE, 4-/5 RLE, 3+/5 LLE   Musc/Skel:  No edema, no tenderness Gen NAD. Vital signs reivewed.   Assessment/Plan: 1. 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 Physiatrist is providing close team supervision and 24 hour management of active medical problems listed below. Physiatrist and rehab team continue to assess barriers to discharge/monitor patient progress toward functional and medical goals. FIM: Function - Bathing Position: Wheelchair/chair at sink Body parts bathed by patient: Right arm, Left arm, Chest, Abdomen, Left upper leg, Right upper leg, Front perineal area, Right lower leg, Left lower leg Body parts bathed by helper: Back, Buttocks Assist Level: Touching or steadying assistance(Pt > 75%)  Function- Upper Body Dressing/Undressing What is the patient wearing?: Pull over shirt/dress Pull over shirt/dress - Perfomed by patient: Thread/unthread right sleeve, Thread/unthread left sleeve, Put head through opening Pull over shirt/dress -  Perfomed by helper: Pull shirt over trunk Assist Level: Touching or steadying assistance(Pt > 75%) Function - Lower Body Dressing/Undressing What is the patient wearing?: Pants Position: Other (comment) (w/c and stedy ) Pants- Performed by patient: Thread/unthread right pants leg, Thread/unthread left pants leg Pants- Performed by helper: Pull pants up/down Non-skid slipper socks- Performed by helper: Don/doff right sock, Don/doff left sock Shoes - Performed by helper: Don/doff right shoe, Don/doff left shoe, Fasten right, Fasten left Assist for footwear: Maximal assist Assist for lower body dressing:  (totalA)  Function - Toileting Toileting steps completed by helper: Adjust clothing prior to toileting, Performs perineal hygiene, Adjust clothing after toileting Toileting Assistive Devices: Toilet aid Assist level: Two helpers  Function - Air cabin crew transfer assistive device: Facilities manager lift: Stedy Assist level to toilet: 2 helpers Assist level from toilet: 2 helpers  Function - Chair/bed transfer Chair/bed transfer [place]: Squat pivot Chair/bed transfer assist level: Moderate assist (Pt 50 - 74%/lift or lower) Chair/bed transfer assistive device: Armrests Mechanical lift: Other Chair/bed transfer details: Verbal cues for technique, Verbal cues for sequencing, Tactile cues for weight bearing, Tactile cues for weight shifting, Tactile cues for posture, Manual facilitation for weight shifting  Function - Locomotion: Wheelchair Type: Manual Max wheelchair distance: 133ft  Assist Level: Supervision or verbal cues Assist Level: Supervision or verbal cues Wheel 150 feet activity did not occur: Safety/medical concerns Assist Level: Supervision or verbal cues Function - Locomotion: Ambulation Assistive device: Parallel bars Max distance: 8 Assist level: 2 helpers Walk 10 feet activity did not occur: Safety/medical concerns Walk 50 feet with 2 turns  activity did not occur: Safety/medical concerns Walk 150 feet activity did not occur: Safety/medical concerns Walk 10 feet on uneven surfaces activity did not occur: Safety/medical concerns  Function - Comprehension Comprehension: Auditory Comprehension assist level: Understands basic 90% of the time/cues < 10% of the time  Function - Expression Expression: Verbal Expression assist level: Expresses basic 90% of the time/requires cueing < 10% of the time.  Function - Social Interaction Social Interaction assist level: Interacts appropriately 90% of the time - Needs monitoring or encouragement for participation or interaction.  Function - Problem Solving Problem solving assist level: Solves basic 90% of the time/requires cueing < 10% of the time  Function - Memory Memory assist level: Recognizes or recalls 75 - 89% of the time/requires cueing 10 - 24% of the time Medical Problem List and Plan: 1. Decreased functional mobility with aphasiasecondary to multifocal intraparenchymal hemorrhages  Cont CIR, planned discharge in am 2. DVT Prophylaxis/Anticoagulation: SCDs.  3. Pain Management/trigeminal neuralgia: Resume Neurontin 100-200 mg  at bedtime as needed- no change at present 4. Mood/Alzheimer's dementia: Provide emotional support.[place] with family 5. Neuropsych: This patient iscapable of making decisions on herown behalf. 6. Skin/Wound Care: Routine skin checks, foam prophyllactic sacral dressing 7. Fluids/Electrolytes/Nutrition: Routine I&O's  Alb low on prostat, BUN and creatinine normal on 10/22/2016, ?trending up, labs ordered for Monday 8.Hypertension.Norvasc 10 mg daily,  reduced Lasix to 20 mg 10/22/2016.   Blood pressures controlled on 7/30 Vitals:   10/27/16 1446 10/28/16 0458  BP: (!) 112/40 (!) 124/47  Pulse: 78 65  Resp: 16 16  Temp: 98.4 F (36.9 C) 98.8 F (37.1 C)    9Hyperlipidemia. Pravachol 10.  Urinary incont - had some problems PTA no  dysuria, no fever may be CVA related spastic bladder , rec toileting program 11. Constipation. Senna Daily reduced on 7/27 12.  Type 2 diabetes, on metformin, check CBGs CBG (last 3)   Recent Labs  10/27/16 1624 10/27/16 2136 10/28/16 0540  GLUCAP 183* 114* 86   Labile, but overall controlled 7/30 13. ABLA  Hb 8.5 on 7/18, repeat 9.6 on 7/31    LOS (Days) 14 A FACE TO FACE EVALUATION WAS PERFORMED  KIRSTEINS,ANDREW E 10/28/2016, 8:16 AM

## 2016-10-28 NOTE — Progress Notes (Signed)
Physical Therapy Session Note  Patient Details  Name: Julia Floyd MRN: [id number] Date of Birth: 1930-05-24  Today's Date: 10/28/2016 PT Individual Time: 1015-1115 PT Individual Time Calculation (min): 60 min   Short Term Goals: Week 2:  PT Short Term Goal 1 (Week 2): Pt will perform bed<>WC transfer with supervision assist and LRAD  PT Short Term Goal 2 (Week 2): Pt will ambulate 5f with mod assist and LRAD  PT Short Term Goal 3 (Week 2): PT will perform bed mobility with supervision assist consistently PT Short Term Goal 4 (Week 2): pt will perform sit<>stand with min assist and RW.   Skilled Therapeutic Interventions/Progress Updates: Pt presented in w/c agreeable to therapy. Transported pt to ortho gym and performed car transfer. Performed with minA and pt holding onto door. Pt aware increased safety risk however felt most comfortable with this technique. Performed squat pivot transfer bed to/from w/c with minA and bed mobility with hospital bed supervision with use of bed rail and additional time. Practiced w/c recliner transfer transfer, minA to recliner, modA return to w/c as lower surface. Pt returned to room and remained in w/c with call bell within reach and needs met.   Tx2: Pt presented in w/c agreeable to therapy. Pt indicating needs to use toilet, transported w/c to toilet and performed squat pivot transfer to toilet with minA. Pt able to clear legs from toilet to allow PTA to doff brief and pants. After toileting pt performed sit to stand with wall rail to allow PTA to don pants. Performed w/c mobility in room for neogotiation which required additional time. Pt remained in room and in w/c with call bell within reach and needs met.      Therapy Documentation Precautions:  Precautions Precautions: Fall Precaution Comments: dementia Restrictions Weight Bearing Restrictions: No General:   Vital Signs:  Pain: Pain Assessment Pain Assessment: No/denies  pain Mobility: Bed Mobility Bed Mobility: Rolling Right;Rolling Left;Sit to Supine;Supine to Sit Rolling Right: 5: Supervision Rolling Left: 5: Supervision Supine to Sit: 4: Min assist Sit to Supine: 4: Min assist Transfers Transfers: Yes Sit to Stand: 4: Min assist Stand Pivot Transfers: 3: Mod assist Squat Pivot Transfers: 4: Min assist Locomotion : Ambulation Ambulation: Yes Ambulation/Gait Assistance: 3: Mod assist Ambulation Distance (Feet): 8 Feet Assistive device: Parallel bars Gait Gait: Yes Gait Pattern: Decreased stance time - left;Decreased step length - left;Decreased step length - right;Left steppage Stairs / Additional Locomotion Stairs: No WArchitect Yes Wheelchair Assistance: 5: SCareers information officer Both upper extremities Wheelchair Parts Management: Needs assistance Distance: 1534f Trunk/Postural Assessment : Cervical Assessment Cervical Assessment: Exceptions to [facility] head) Thoracic Assessment Thoracic Assessment: Exceptions to [facility] HospitalKyphotic) Lumbar Assessment Lumbar Assessment: Exceptions to WFSalt [facility] Centerposterior pelvic tilt) Postural Control Postural Control: Deficits on evaluation  Balance: Balance Balance Assessed: Yes Static Sitting Balance Static Sitting - Balance Support: Feet supported Static Sitting - Level of Assistance: 6: Modified independent (Device/Increase time) Dynamic Sitting Balance Dynamic Sitting - Balance Support: During functional activity;Feet supported Dynamic Sitting - Level of Assistance: 6: Modified independent (Device/Increase time) Static Standing Balance Static Standing - Balance Support: During functional activity;Bilateral upper extremity supported (with RW) Static Standing - Level of Assistance: 4: Min assist Dynamic Standing Balance Dynamic Standing - Balance Support: During functional activity;Bilateral upper extremity supported Dynamic Standing - Level of Assistance:  3: Mod assist  See Function Navigator for Current Functional Status.   Therapy/Group: Individual Therapy  Phenix Grein  Tashae Inda, PTA  10/28/2016, 12:36 PM

## 2016-10-28 NOTE — Progress Notes (Signed)
Occupational Therapy Session Note  Patient Details  Name: Julia Floyd MRN: [id number] Date of Birth: [birth year]  Today's Date: 10/28/2016 OT Individual Time: 0830-0930 OT Individual Time Calculation (min): 60 min    Short Term Goals: Week 2:  OT Short Term Goal 1 (Week 2): STG=LTG due to LOS  Skilled Therapeutic Interventions/Progress Updates:    Pt seen for OT ADL bathing/dressing session. Pt sitting up in w/c upon arrival, agreeable to tx session and bathing at shower level. Denied pain this morning. She completed stand pivot transfer to [facility] in shower with steadying assist using grab bars. She bathed from sitting position with assist provided for feet and buttock hygiene. Used B UEs throughout session at mod I level, able to open  She returned to w/c to dress, dressing UB with set-up assist. She was able to recall use of reacher taught in yesterday's session used reacher to assist with threading LEs into pants, able to complete with increased time. She stood with use of chair in front of her and total A provided for pulling up pants. She completed grooming tasks mod I level at sink. Pt left seated in w/c at end of session, all needs in reach.   Therapy Documentation Precautions:  Precautions Precautions: Fall Precaution Comments: General weakness L>R; dementia Restrictions Weight Bearing Restrictions: No Pain:   No/ denies pain  See Function Navigator for Current Functional Status.   Therapy/Group: Individual Therapy  Lewis, Edwyn Inclan C 10/28/2016, 7:00 AM

## 2016-10-28 NOTE — Progress Notes (Signed)
Patient sitting up in wheelchair at bedside. Denies pain/discomfort at current time.

## 2016-10-28 NOTE — Progress Notes (Signed)
Physical Therapy Session Note  Patient Details  Name: Julia Floyd MRN: [id number] Date of Birth: October 15, 1930  Today's Date: 10/28/2016 PT Individual Time: 1445-1530 PT Individual Time Calculation (min): 45 min   Short Term Goals: Week 1:  PT Short Term Goal 1 (Week 1): Pt will perform bed mobility with min assist from PT PT Short Term Goal 1 - Progress (Week 1): Met PT Short Term Goal 2 (Week 1): Pt will transfer to and from [facility] with min assist from PT PT Short Term Goal 2 - Progress (Week 1): Met PT Short Term Goal 3 (Week 1): Pt will propell WC 123f with min assist  PT Short Term Goal 3 - Progress (Week 1): Met PT Short Term Goal 4 (Week 1): Pt will perform sit<>stand with min assist and BUE support.  PT Short Term Goal 4 - Progress (Week 1): Partly met Week 2:  PT Short Term Goal 1 (Week 2): Pt will perform bed<>WC transfer with supervision assist and LRAD  PT Short Term Goal 2 (Week 2): Pt will ambulate 171fwith mod assist and LRAD  PT Short Term Goal 3 (Week 2): PT will perform bed mobility with supervision assist consistently PT Short Term Goal 4 (Week 2): pt will perform sit<>stand with min assist and RW.   Skilled Therapeutic Interventions/Progress Updates:   Pt in w/c upon arrival and agreeable to therapy, no c/o pain.   Pt self-propelled w/c 250' w/ supervision and bilateral UE use to work on functional endurance. Verbal cues for technique.   Work NMR w/ dynamic sitting balance w/o UE support while performing bilateral UE tasks to mimic functional tasks and ADLs, w/ supervision.   Ended session in w/c, call bell within reach and all needs met.   Therapy Documentation Precautions:  Precautions Precautions: Fall Precaution Comments: dementia Restrictions Weight Bearing Restrictions: No GPain: Pain Assessment Pain Assessment: No/denies pain Pain Score: 0-No pain  See Function Navigator for Current Functional Status.   Therapy/Group: Individual  Therapy  Ysabella Babiarz K Arnette 10/28/2016, 5:09 PM

## 2016-10-28 NOTE — Discharge Summary (Signed)
Discharge summary job [id number]

## 2016-10-28 NOTE — Discharge Summary (Signed)
Julia Floyd, Julia Floyd           ACCOUNT NO.:  [id number]  MEDICAL RECORD NO.:  [id number]  LOCATION:  [id number]                        FACILITY:  [facility]  PHYSICIAN:  Charlett Blake, M.D.DATE OF BIRTH:  1930/10/20  DATE OF ADMISSION:  10/14/2016 DATE OF DISCHARGE:  10/29/2016                              DISCHARGE SUMMARY   DISCHARGE DIAGNOSES: 1. Multifocal intraparenchymal hemorrhage. 2. Sequential compression device for deep venous thrombosis     prophylaxis. 3. Pain management. 4. Hypertension. 5. Hyperlipidemia. 6. Constipation. 7. Type 2 diabetes mellitus. 8. Acute blood loss anemia.  HISTORY OF PRESENT ILLNESS:  This is an 83 year old right-handed female with history of remote stroke, Alzheimer's dementia, trigeminal neuralgia, diabetes mellitus.  Lives with family 1 level home.  Has a home health aide.  Presented October 07, 2016 with altered mental status and inability to speak.  Cranial CT scan negative for acute changes. The patient did receive tPA.  CT angiogram of head and neck showed no large vessel occlusion.  Left subclavian artery 50% stenosis.  Followup cranial CT scan showed interval development of multifocal intraparenchymal hemorrhage.  No significant mass effect.  MRI showed no new hemorrhage present.  There was some surrounding vasogenic edema. Echocardiogram with ejection fraction of 50%, grade 1 diastolic dysfunction.  Neurology follow up.  Dysphagia #3, thin liquid diet.  The patient was admitted for a comprehensive rehab program.  PAST MEDICAL HISTORY:  See discharge diagnoses.  SOCIAL HISTORY:  Lives with family.  Has a home health aide.  FUNCTIONAL STATUS:  Upon admission to rehab services was max assist, stand pivot transfers; max assist, sit to stand; mod max assist, activities of daily living.  PHYSICAL EXAMINATION:  VITAL SIGNS:  Blood pressure 131/52, pulse 80, temperature 98, respirations 18. GENERAL:  [name], made good  eye contact with examiner. Provides name and age.  Fair awareness of deficits.  EOMs intact. NECK:  Supple.  Nontender.  No JVD. CARDIAC:  Rate controlled. ABDOMEN:  Soft, nontender.  Good bowel sounds. LUNGS:  Clear to auscultation without wheeze.  REHABILITATION HOSPITAL COURSE:  The patient was admitted to inpatient rehab services with therapies initiated on a 3-hour daily basis consisting of physical therapy, occupational therapy, speech therapy, and rehabilitation nursing.  The following issues were addressed during the patient's rehabilitation stay.  Pertaining to Julia Floyd's intraparenchymal hemorrhage, remained stable.  Follow up per Neurology Services.  The patient did have a history of trigeminal neuralgia.  She remained on Neurontin as advised.  Blood pressures remained well controlled on low-dose Lasix as well as Norvasc.  Pravachol for hyperlipidemia.  Type 2 diabetes mellitus with metformin ongoing.  Acute blood loss anemia.  Remaining stable.  No bleeding episodes noted. Latest hemoglobin of 9.6.  The patient received weekly collaborative interdisciplinary team conferences to discuss estimated length of stay, family teaching, any barriers to discharge.  The patient supine to sit with minimal guard needing some increased time.  Performed sit-to-stand with minimal assistance.  Performed modified squat pivot to the right with minimal assist to wheelchair.  Performed squat pivot to the right minimal assist elevated bed.  Performed modified squat pivot to the wheelchair with minimal assistance, propelling wheelchair with  minimal assistance.  Needing some assistance for activities of daily living and homemaking.  She did have a home health aide.  She was able to tolerate standing approximately 30 seconds.  Her diet had been maintained at mechanical soft.  Full teaching completed and plan discharge to home.  DISCHARGE MEDICATIONS:  Included: 1. Norvasc 10 mg p.o.  daily. 2. Lasix 20 mg p.o. daily. 3. Neurontin 200 mg p.o. at bedtime. 4. Glucophage 500 mg p.o. b.i.d. 5. Pravachol 40 mg daily. 6. Vitamin B12 500 mcg p.o. daily.  DIET:  Mechanical soft, diabetic restrictions.  FOLLOWUP:  She would follow up with Dr. Alysia Penna at the [facility] as advised; Dr. Erlinda Hong [facility], call for appointment; Dr. Josetta Huddle medical management.     Lauraine Rinne, P.A.   ______________________________ Charlett Blake, M.D.    DA/MEDQ  D:  10/28/2016  T:  10/28/2016  Job:  [id number]  cc:   Dr. Michaelene Song, M.D. Charlett Blake, M.D.

## 2016-10-28 NOTE — Progress Notes (Signed)
Occupational Therapy Discharge Summary  Patient Details  Name: Julia Floyd MRN: [id number] Date of Birth: [birth year]   Patient has met 7 of 7 long term goals due to improved activity tolerance, improved balance, postural control and improved coordination.  Patient to discharge at [facility] Assist level.  Patient's care partner is independent to provide the necessary physical assistance at discharge. Recommending total A for toileting task and LB clothing management due to pt's poor standing balance and endurance. Family is made aware of this recommendation and voice understanding.  Pt's family reports that she is back to near basleine level. Pt mod I prior to admission, however, recommending CGA  For safety with transfers as unsure how safe mod I level at baseline was. Pt's daughter's have completed family education with hands on assist and are able to provide needed assist at d/c. Pt also has home health aides two hours per day to assist with ADLs.   Recommendation:  Patient will benefit from ongoing skilled OT services in home health setting to continue to advance functional skills in the area of BADL and Reduce care partner burden.  Equipment: Pt has all needed bathroom DME  Reasons for discharge: treatment goals met and discharge from hospital  Patient/family agrees with progress made and goals achieved: Yes  OT Discharge Precautions/Restrictions  Precautions Precautions: Fall Precaution Comments: dementia Restrictions Weight Bearing Restrictions: No Vision Baseline Vision/History: Wears glasses Wears Glasses: Reading only Patient Visual Report: No change from baseline Vision Assessment?: No apparent visual deficits Perception  Perception: Within Functional Limits Praxis Praxis: Intact Cognition Overall Cognitive Status: History of cognitive impairments - at baseline Arousal/Alertness: Awake/alert Orientation Level: Oriented X4 Sustained Attention: Appears  intact Memory: Impaired Memory Impairment: Decreased recall of new information;Decreased short term memory Decreased Short Term Memory: Verbal complex;Functional complex Awareness: Appears intact Problem Solving: Appears intact Safety/Judgment: Appears intact Sensation Sensation Light Touch: Appears Intact Proprioception: Appears Intact Coordination Gross Motor Movements are Fluid and Coordinated: Yes Fine Motor Movements are Fluid and Coordinated: Yes Coordination and Movement Description: Minimal decreased coordination Finger Nose Finger Test: [facility] Motor  Motor Motor: Hemiplegia;Abnormal postural alignment and control Motor - Discharge Observations: Weakness L>R, kyphotic Mobility  Bed Mobility Bed Mobility: Rolling Right;Rolling Left;Sit to Supine;Supine to Sit Rolling Right: 5: Supervision Rolling Left: 5: Supervision Supine to Sit: 4: Min assist Sit to Supine: 4: Min assist Transfers Sit to Stand: 4: Min assist  Trunk/Postural Assessment  Cervical Assessment Cervical Assessment: Exceptions to [facility] (Forward head) Thoracic Assessment Thoracic Assessment: Exceptions to [facility] (Kyphotic) Lumbar Assessment Lumbar Assessment: Exceptions to [facility] (Posterior pelvic tilt) Postural Control Postural Control: Deficits on evaluation  Balance Balance Balance Assessed: Yes Static Sitting Balance Static Sitting - Balance Support: Feet supported Static Sitting - Level of Assistance: 6: Modified independent (Device/Increase time) Dynamic Sitting Balance Dynamic Sitting - Balance Support: During functional activity;Feet supported Dynamic Sitting - Level of Assistance: 6: Modified independent (Device/Increase time) Static Standing Balance Static Standing - Balance Support: Bilateral upper extremity supported Static Standing - Level of Assistance: 5: Stand by assistance;4: Min assist Static Standing - Comment/# of Minutes: Standing while therapist provided toileting care Dynamic Standing  Balance Dynamic Standing - Balance Support: During functional activity;Bilateral upper extremity supported Dynamic Standing - Level of Assistance: 3: Mod assist Extremity/Trunk Assessment RUE Assessment RUE Assessment: Exceptions to WFL (Full AROM; 4/5 strength throughout) LUE Assessment LUE Assessment: Exceptions to WFL (Full ROM; 4/5 strength throughout)   See Function Navigator for Current Functional Status.  Bobby Rumpf, Xylia Scherger  C 10/28/2016, 3:23 PM

## 2016-10-29 LAB — GLUCOSE, CAPILLARY
GLUCOSE-CAPILLARY: 92 mg/dL (ref 65–99)
GLUCOSE-CAPILLARY: 112 mg/dL — AB (ref 65–99)

## 2016-10-29 MED ORDER — PRAVASTATIN SODIUM 40 MG PO TABS: 40.0000 mg | ORAL_TABLET | Freq: Every day | ORAL | 0 refills | Status: DC

## 2016-10-29 MED ORDER — GABAPENTIN 100 MG PO CAPS: 100.0000 mg | ORAL_CAPSULE | Freq: Every day | ORAL | 0 refills | Status: AC

## 2016-10-29 MED ORDER — METFORMIN HCL 500 MG PO TABS: 500.0000 mg | ORAL_TABLET | Freq: Two times a day (BID) | ORAL | 0 refills | Status: DC

## 2016-10-29 MED ORDER — CYANOCOBALAMIN 500 MCG PO TABS: 500.0000 ug | ORAL_TABLET | Freq: Every day | ORAL | 0 refills | Status: AC

## 2016-10-29 MED ORDER — AMLODIPINE BESYLATE 10 MG PO TABS: 10.0000 mg | ORAL_TABLET | Freq: Every day | ORAL | 0 refills | Status: DC

## 2016-10-29 MED ORDER — FUROSEMIDE 20 MG PO TABS: 20.0000 mg | ORAL_TABLET | Freq: Every day | ORAL | 0 refills | Status: AC

## 2016-10-29 NOTE — Progress Notes (Signed)
Pt informed nurse that ride for d/c will be sometime after 5pm today.  Unable to provide detailed time. Notified charge nurse

## 2016-10-29 NOTE — Progress Notes (Signed)
Pt d/c with belongs. D/C instructions provided to pt by [name] with instructions left at bedside for granddaughter.   D/c instruction packet given to her.  Reviewed and verbalized understanding of d/c instructions at this time.  Can call if need additional information.

## 2016-10-29 NOTE — Progress Notes (Signed)
Patient safety maintained.  Patient denies needs/concerns at this time. 

## 2016-10-29 NOTE — Progress Notes (Signed)
Subjective/Complaints:  No issues overnite, pt aware of d/c    ROS: Denies CP/SOB, N/V/D  Objective: Vital Signs: Blood pressure (!) 129/52, pulse 69, temperature 99.2 F (37.3 C), temperature source Oral, resp. rate 16, height 5\' 6"  (1.676 m), weight 73.7 kg (162 lb 7.7 oz), SpO2 100 %. No results found. Results for orders placed or performed during the hospital encounter of 10/14/16 (from the past 72 hour(s))  Glucose, capillary     Status: Abnormal   Collection Time: 10/26/16 11:39 AM  Result Value Ref Range   Glucose-Capillary 136 (H) 65 - 99 mg/dL  Glucose, capillary     Status: None   Collection Time: 10/26/16  5:10 PM  Result Value Ref Range   Glucose-Capillary 98 65 - 99 mg/dL  Glucose, capillary     Status: Abnormal   Collection Time: 10/26/16  9:05 PM  Result Value Ref Range   Glucose-Capillary 130 (H) 65 - 99 mg/dL  Glucose, capillary     Status: Abnormal   Collection Time: 10/27/16  6:44 AM  Result Value Ref Range   Glucose-Capillary 101 (H) 65 - 99 mg/dL  CBC     Status: Abnormal   Collection Time: 10/27/16 11:16 AM  Result Value Ref Range   WBC 5.4 4.0 - 10.5 K/uL   RBC 3.22 (L) 3.87 - 5.11 MIL/uL   Hemoglobin 9.6 (L) 12.0 - 15.0 g/dL   HCT 30.6 (L) 36.0 - 46.0 %   MCV 95.0 78.0 - 100.0 fL   MCH 29.8 26.0 - 34.0 pg   MCHC 31.4 30.0 - 36.0 g/dL   RDW 16.1 (H) 11.5 - 15.5 %   Platelets 182 150 - 400 K/uL  Glucose, capillary     Status: None   Collection Time: 10/27/16 12:24 PM  Result Value Ref Range   Glucose-Capillary 90 65 - 99 mg/dL  Glucose, capillary     Status: Abnormal   Collection Time: 10/27/16  4:24 PM  Result Value Ref Range   Glucose-Capillary 183 (H) 65 - 99 mg/dL  Glucose, capillary     Status: Abnormal   Collection Time: 10/27/16  9:36 PM  Result Value Ref Range   Glucose-Capillary 114 (H) 65 - 99 mg/dL   Comment 1 Notify RN   Glucose, capillary     Status: None   Collection Time: 10/28/16  5:40 AM  Result Value Ref Range   Glucose-Capillary 86 65 - 99 mg/dL   Comment 1 Notify RN   Glucose, capillary     Status: Abnormal   Collection Time: 10/28/16 11:22 AM  Result Value Ref Range   Glucose-Capillary 128 (H) 65 - 99 mg/dL   Comment 1 Notify RN   Glucose, capillary     Status: Abnormal   Collection Time: 10/28/16  4:21 PM  Result Value Ref Range   Glucose-Capillary 174 (H) 65 - 99 mg/dL   Comment 1 Notify RN   Glucose, capillary     Status: Abnormal   Collection Time: 10/28/16  9:22 PM  Result Value Ref Range   Glucose-Capillary 139 (H) 65 - 99 mg/dL   Comment 1 Notify RN   Glucose, capillary     Status: None   Collection Time: 10/29/16  6:25 AM  Result Value Ref Range   Glucose-Capillary 92 65 - 99 mg/dL     HEENT: Normocephalic, atraumatic Cardio: RRR and no JVD Resp: CTA B/L and Unlabored GI: BS positive ND Skin:   Intact, Warm and dry Neuro: Alert and Oriented  Motor 4/5 BUE, 4-/5 RLE, 3+/5 LLE   Musc/Skel:  No edema, no tenderness Gen NAD. Vital signs reivewed.   Assessment/Plan: 1. 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  Stable for D/C today F/u PCP in 3-4 weeks F/u PM&R 2 weeks See D/C summary See D/C instructions FIM: Function - Bathing Position: Shower Body parts bathed by patient: Right arm, Left arm, Chest, Abdomen, Left upper leg, Right upper leg, Front perineal area, Right lower leg, Left lower leg Body parts bathed by helper: Buttocks, Back Assist Level: Touching or steadying assistance(Pt > 75%)  Function- Upper Body Dressing/Undressing What is the patient wearing?: Pull over shirt/dress Pull over shirt/dress - Perfomed by patient: Thread/unthread right sleeve, Thread/unthread left sleeve, Put head through opening, Pull shirt over trunk Pull over shirt/dress - Perfomed by helper: Pull shirt over trunk Assist Level: Set up Set up : To obtain clothing/put away Function - Lower Body Dressing/Undressing What is the patient wearing?: Pants Position:  Wheelchair/chair at sink Pants- Performed by patient: Thread/unthread right pants leg, Thread/unthread left pants leg Pants- Performed by helper: Pull pants up/down Non-skid slipper socks- Performed by helper: Don/doff right sock, Don/doff left sock Shoes - Performed by helper: Don/doff right shoe, Don/doff left shoe, Fasten right, Fasten left Assist for footwear: Maximal assist Assist for lower body dressing:  (totalA)  Function - Toileting Toileting steps completed by helper: Adjust clothing prior to toileting, Performs perineal hygiene, Adjust clothing after toileting Toileting Assistive Devices: Grab bar or rail Assist level: Two helpers  Function - Air cabin crew transfer assistive device: Bedside commode, Grab bar Mechanical lift: Stedy Assist level to toilet: 2 helpers Assist level from toilet: 2 helpers Assist level to bedside commode (at bedside): Supervision or verbal cues Assist level from bedside commode (at bedside): Supervision or verbal cues  Function - Chair/bed transfer Chair/bed transfer method: Lateral scoot Chair/bed transfer assist level: Touching or steadying assistance (Pt > 75%) Chair/bed transfer assistive device: Armrests Mechanical lift: Other Chair/bed transfer details: Verbal cues for sequencing, Verbal cues for technique, Tactile cues for weight shifting  Function - Locomotion: Wheelchair Type: Manual Max wheelchair distance: 250' Assist Level: Supervision or verbal cues Assist Level: Supervision or verbal cues Wheel 150 feet activity did not occur: Safety/medical concerns Assist Level: Supervision or verbal cues Turns around,maneuvers to table,bed, and toilet,negotiates 3% grade,maneuvers on rugs and over doorsills: No Function - Locomotion: Ambulation Assistive device: Parallel bars Max distance: 8 Assist level: Moderate assist (Pt 50 - 74%) Walk 10 feet activity did not occur: Safety/medical concerns Walk 50 feet with 2 turns activity  did not occur: Safety/medical concerns Walk 150 feet activity did not occur: Safety/medical concerns Walk 10 feet on uneven surfaces activity did not occur: Safety/medical concerns  Function - Comprehension Comprehension: Auditory Comprehension assist level: Understands basic 90% of the time/cues < 10% of the time  Function - Expression Expression: Verbal Expression assist level: Expresses basic 90% of the time/requires cueing < 10% of the time.  Function - Social Interaction Social Interaction assist level: Interacts appropriately 90% of the time - Needs monitoring or encouragement for participation or interaction.  Function - Problem Solving Problem solving assist level: Solves basic 90% of the time/requires cueing < 10% of the time  Function - Memory Memory assist level: Recognizes or recalls 75 - 89% of the time/requires cueing 10 - 24% of the time Medical Problem List and Plan: 1. 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 DVT  Prophylaxis/Anticoagulation: SCDs.  3. Pain Management/trigeminal neuralgia: Resume Neurontin 100-200 mg at bedtime as needed- no change at present 4. Mood/Alzheimer's dementia: Provide emotional support.[place] with family 5. Neuropsych: This patient iscapable of making decisions on herown behalf. 6. Skin/Wound Care: Routine skin checks, foam prophyllactic sacral dressing 7. Fluids/Electrolytes/Nutrition: Routine I&O's  stable 8.Hypertension.Norvasc 10 mg daily,  reduced Lasix to 20 mg 10/22/2016.   Blood pressures controlled on 8/1 Vitals:   10/28/16 1240 10/29/16 0441  BP: 127/67 (!) 129/52  Pulse: 67 69  Resp: 17 16  Temp: 98.2 F (36.8 C) 99.2 F (37.3 C)    9Hyperlipidemia. Pravachol 10.  Urinary incont - had some problems PTA no dysuria, no fever may be CVA related spastic bladder , rec toileting program 11. Constipation. Senna Daily  reduced on 7/27 12.  Type 2 diabetes, on metformin, check CBGs CBG (last 3)   Recent Labs  10/28/16 1621 10/28/16 2122 10/29/16 0625  GLUCAP 174* 139* 92   Labile, but overall controlled 8/1 13. ABLA  Hb 8.5 on 7/18, repeat 9.6 on 7/31    LOS (Days) 15 A FACE TO FACE EVALUATION WAS PERFORMED  KIRSTEINS,ANDREW E 10/29/2016, 9:28 AM

## 2016-10-29 NOTE — Progress Notes (Signed)
Social Work Discharge Note  The overall goal for the admission was met for:   Discharge location: Yes - home  Length of Stay: Yes - 15 days  Discharge activity level: Yes  Home/community participation: Yes  Services provided included: MD, RD, PT, OT, RN, Pharmacy, Neuropsych and SW  Financial Services: Private Insurance: [employer]  Follow-up services arranged: Home Health: PT/OT and Patient/Family request agency HH: [facility], DME: Pt has all recommended equipment at home.  Comments (or additional information):  Pt to d/c to her home with her family to provide care.  Daughters have received family education/training.  CSW remains available as needed and granddaughter will call if they need anything.  Patient/Family verbalized understanding of follow-up arrangements: Yes  Individual responsible for coordination of the follow-up plan: pt's daughters and granddaughters  Confirmed correct DME delivered: Trey Sailors 10/29/2016    Leannah Guse, [name]

## 2016-10-29 NOTE — Discharge Instructions (Signed)
Inpatient Rehab Discharge Instructions  [place] Discharge date and time: No discharge date for patient encounter.   Activities/Precautions/ Functional Status: Activity: activity as tolerated Diet: soft Wound Care: none needed Functional status:  ___ No restrictions     ___ Walk up steps independently ___ 24/7 supervision/assistance   ___ Walk up steps with assistance ___ Intermittent supervision/assistance  ___ Bathe/dress independently ___ Walk with walker     _x  COMMUNITY REFERRALS UPON DISCHARGE:   Home Health:   PT     OT  Agency:  [facility] Phone:  [phone] Medical Equipment/Items Ordered:  You have all recommended equipment at home.   GENERAL COMMUNITY RESOURCES FOR PATIENT/FAMILY: Support Groups:  [place] [facility] Support Group                              Meets the second Thursday of every month from 3-4PM (except June, July, August)                              On [facility] at [facility] in the [place]                              For more information, please call [place] Cigarette smoking nearly doubles your risk of having a stroke & is the single most alterable risk factor  If you smoke or have smoked in the last 12 months, you are advised to quit smoking for your health.  Most of the excess cardiovascular risk related to smoking disappears within a year of stopping.  Ask you doctor about anti-smoking medications  [place] Quit Line: 1-800-QUIT NOW  Free Smoking Cessation Classes [phone]  CHOLESTEROL Know your levels; limit fat & cholesterol in your diet  Lipid Panel     Component Value Date/Time   CHOL 154 10/08/2016 0417   TRIG 263 (H) 10/08/2016 0417   HDL 14 (L) 10/08/2016 0417   CHOLHDL 11.0 10/08/2016 0417   VLDL 53 (H) 10/08/2016 0417   LDLCALC 87 10/08/2016 0417      Many patients benefit from treatment even if their  cholesterol is at goal.  Goal: Total Cholesterol (CHOL) less than 160  Goal:  Triglycerides (TRIG) less than 150  Goal:  HDL greater than 40  Goal:  LDL (LDLCALC) less than 100   BLOOD PRESSURE American Stroke Association blood pressure target is less that 120/80 mm/Hg  Your discharge blood pressure is:  BP: (!) 140/52  Monitor your blood pressure  Limit your salt and alcohol intake  Many individuals will require more than one medication for high blood pressure  DIABETES (A1c is a blood sugar average for last 3 months) Goal HGBA1c is under 7% (HBGA1c is blood sugar average for last 3 months)  Diabetes:     Lab Results  Component Value Date   HGBA1C 5.1 10/08/2016     Your HGBA1c can be lowered with medications, healthy diet, and exercise.  Check your blood sugar as directed by your physician  Call your physician if you experience unexplained or low blood sugars.  PHYSICAL ACTIVITY/REHABILITATION Goal is 30 minutes at least 4 days per week  Activity: Increase activity slowly, Therapies: Physical Therapy: Home Health Return to work:   Activity decreases your  risk of heart attack and stroke and makes your heart stronger.  It helps control your weight and blood pressure; helps you relax and can improve your mood.  Participate in a regular exercise program.  Talk with your doctor about the best form of exercise for you (dancing, walking, swimming, cycling).  DIET/WEIGHT Goal is to maintain a healthy weight  Your discharge diet is: DIET DYS 3 Room service appropriate? Yes; Fluid consistency: Thin  liquids Your height is:  Height: 5\' 6"  (167.6 cm) Your current weight is: Weight: 78.4 kg (172 lb 14.9 oz) Your Body Mass Index (BMI) is:  BMI (Calculated): 28  Following the type of diet specifically designed for you will help prevent another stroke.  Your goal weight range is:    Your goal Body Mass Index (BMI) is 19-24.  Healthy food habits can help reduce 3 risk factors for  stroke:  High cholesterol, hypertension, and excess weight.  RESOURCES Stroke/Support Group:  Call [phone]   STROKE EDUCATION PROVIDED/REVIEWED AND GIVEN TO PATIENT Stroke warning signs and symptoms How to activate emergency medical system (call 911). Medications prescribed at discharge. Need for follow-up after discharge. Personal risk factors for stroke. Pneumonia vaccine given:  Flu vaccine given:  My questions have been answered, the writing is legible, and I understand these instructions.  I will adhere to these goals & educational materials that have been provided to me after my discharge from the hospital.   __ Bathe/dress with assistance ___ Walk Independently    ___ Shower independently ___ Walk with assistance    ___ Shower with assistance ___ No alcohol     ___ Return to work/school ________  Special Instructions:    My questions have been answered and I understand these instructions. I will adhere to these goals and the provided educational materials after my discharge from the hospital.  Patient/Caregiver Signature _______________________________ Date __________  Clinician Signature _______________________________________ Date __________  Please bring this form and your medication list with you to all your follow-up doctor's appointments.

## 2016-10-30 ENCOUNTER — Telehealth: Payer: Self-pay

## 2016-10-30 NOTE — Telephone Encounter (Signed)
Patient name: CAPPELLETTI, BLANCH) DOB: (1930-10-31)  1. Are you/is patient experiencing any problems since coming home? (leg weakness) a. Are there any questions regarding any aspect of care? (no) 2. Are there any questions regarding medications administration/dosing? (cant find script) a. Are meds being taken as prescribed? (yes) b. "Patient should review meds with caller to confirm"  3. Have there been any falls? (no) 4. Has Home Health been to the house and/or have they contacted you? (no) a. If not, have you tried to contact them? (no) b. Can we help you contact them? (yes) 5. Are bowels and bladder emptying properly? (no) a. Are there any unexpected incontinence issues? (no) b. If applicable, is patient following bowel/bladder programs? (na) 6. Any fevers, problems with breathing, unexpected pain? (no) 7. Are there any skin problems or new areas of breakdown? (pressure sore on bottom) 8. Has the patient/family member arranged specialty MD follow up (ie cardiology/neurology/renal/surgical/etc.)?  (yes) a. Can we help arrange? (yes, home health) 9. Does the patient need any other services or support that we can help arrange? (no) 10. Are caregivers following through as expected in assisting the patient? (yes) 11. Has the patient quit smoking, drinking alcohol, or using drugs as recommended? (na)   Appointment date/time (11-07-2016 / [id number]), arrive time ([id number]) and who it is with here (DR. Letta Pate) [place]

## 2016-10-30 NOTE — Telephone Encounter (Deleted)
Transitional Care call attempt 1  Patient name: BURGGRAF, BLANCH) DOB: ([birth year]) Appointment date/time (11-07-2016 / [phone]), arrive time ([id number]) and who it is with here (DR. Letta Pate) [place] with [facility], was asked to call back later due to him being at work and busy.

## 2016-10-31 ENCOUNTER — Telehealth: Payer: Self-pay

## 2016-10-31 NOTE — Telephone Encounter (Signed)
[facility] called and she is reporting that there is a sacral wound on Julia Floyd and is asking for a one time [facility] visit to go out and assess and dress the wound over the weekend.  Julia Floyd will follow up with Dr Letta Pate on Monday (has appt) .  Verbal order given for [facility].

## 2016-11-03 ENCOUNTER — Encounter: Payer: Medicare Other | Admitting: Physical Medicine & Rehabilitation

## 2016-11-20 ENCOUNTER — Encounter: Payer: Medicare Other | Admitting: Physical Medicine & Rehabilitation

## 2016-11-20 ENCOUNTER — Encounter: Payer: Medicare Other | Attending: Physical Medicine & Rehabilitation

## 2016-12-23 ENCOUNTER — Ambulatory Visit: Payer: Self-pay | Admitting: Neurology

## 2016-12-23 ENCOUNTER — Telehealth: Payer: Self-pay

## 2016-12-23 NOTE — Telephone Encounter (Signed)
PATIENT WAS A NO SHOW FOR APPT TODAY. 

## 2016-12-24 ENCOUNTER — Encounter: Payer: Self-pay | Admitting: Neurology

## 2017-01-11 ENCOUNTER — Emergency Department (HOSPITAL_COMMUNITY): Payer: Medicare Other

## 2017-01-11 ENCOUNTER — Encounter (HOSPITAL_COMMUNITY): Payer: Self-pay

## 2017-01-11 ENCOUNTER — Emergency Department (HOSPITAL_COMMUNITY)
Admission: EM | Admit: 2017-01-11 | Discharge: 2017-01-12 | Disposition: A | Discharge status: Home / Self Care | Payer: Medicare Other | Attending: Emergency Medicine | Admitting: Emergency Medicine

## 2017-01-11 DIAGNOSIS — Z7984 Long term (current) use of oral hypoglycemic drugs: Secondary | ICD-10-CM | POA: Insufficient documentation

## 2017-01-11 DIAGNOSIS — Z79899 Other long term (current) drug therapy: Secondary | ICD-10-CM | POA: Diagnosis not present

## 2017-01-11 DIAGNOSIS — I10 Essential (primary) hypertension: Secondary | ICD-10-CM | POA: Insufficient documentation

## 2017-01-11 DIAGNOSIS — R479 Unspecified speech disturbances: Secondary | ICD-10-CM | POA: Diagnosis not present

## 2017-01-11 DIAGNOSIS — E119 Type 2 diabetes mellitus without complications: Secondary | ICD-10-CM | POA: Insufficient documentation

## 2017-01-11 HISTORY — DX: Cerebral infarction, unspecified: I63.9

## 2017-01-11 LAB — COMPREHENSIVE METABOLIC PANEL
ALK PHOS: 68 U/L (ref 38–126)
ALT: 8 U/L — AB (ref 14–54)
AST: 16 U/L (ref 15–41)
Albumin: 3.4 g/dL — ABNORMAL LOW (ref 3.5–5.0)
Anion gap: 8 (ref 5–15)
BILIRUBIN TOTAL: 0.3 mg/dL (ref 0.3–1.2)
BUN: 11 mg/dL (ref 6–20)
CALCIUM: 9 mg/dL (ref 8.9–10.3)
CO2: 25 mmol/L (ref 22–32)
CREATININE: 0.86 mg/dL (ref 0.44–1.00)
Chloride: 104 mmol/L (ref 101–111)
GFR calc Af Amer: >60 mL/min (ref >60)
GLUCOSE: 146 mg/dL — AB (ref 65–99)
Potassium: 3.7 mmol/L (ref 3.5–5.1)
Sodium: 137 mmol/L (ref 135–145)
TOTAL PROTEIN: 6.9 g/dL (ref 6.5–8.1)

## 2017-01-11 LAB — URINALYSIS, ROUTINE W REFLEX MICROSCOPIC
Bacteria, UA: NONE SEEN
Bilirubin Urine: NEGATIVE
GLUCOSE, UA: NEGATIVE mg/dL
Hgb urine dipstick: NEGATIVE
KETONES UR: NEGATIVE mg/dL
Nitrite: NEGATIVE
PH: 7 (ref 5.0–8.0)
Protein, ur: NEGATIVE mg/dL
SPECIFIC GRAVITY, URINE: 1.013 (ref 1.005–1.030)

## 2017-01-11 LAB — RAPID URINE DRUG SCREEN, HOSP PERFORMED
Amphetamines: NOT DETECTED
BARBITURATES: NOT DETECTED
BENZODIAZEPINES: NOT DETECTED
Cocaine: NOT DETECTED
Opiates: NOT DETECTED
TETRAHYDROCANNABINOL: NOT DETECTED

## 2017-01-11 LAB — CBC WITH DIFFERENTIAL/PLATELET
BASOS PCT: 0 %
Basophils Absolute: 0 10*3/uL (ref 0.0–0.1)
EOS ABS: 0 10*3/uL (ref 0.0–0.7)
Eosinophils Relative: 0 %
HCT: 35.4 % — ABNORMAL LOW (ref 36.0–46.0)
HEMOGLOBIN: 10.9 g/dL — AB (ref 12.0–15.0)
Lymphocytes Relative: 35 %
Lymphs Abs: 1.3 10*3/uL (ref 0.7–4.0)
MCH: 28.3 pg (ref 26.0–34.0)
MCHC: 30.8 g/dL (ref 30.0–36.0)
MCV: 91.9 fL (ref 78.0–100.0)
MONOS PCT: 9 %
Monocytes Absolute: 0.3 10*3/uL (ref 0.1–1.0)
NEUTROS PCT: 56 %
Neutro Abs: 2.1 10*3/uL (ref 1.7–7.7)
Platelets: 121 10*3/uL — ABNORMAL LOW (ref 150–400)
RBC: 3.85 MIL/uL — ABNORMAL LOW (ref 3.87–5.11)
RDW: 15.2 % (ref 11.5–15.5)
WBC: 3.7 10*3/uL — ABNORMAL LOW (ref 4.0–10.5)

## 2017-01-11 LAB — ETHANOL: Alcohol, Ethyl (B): <10 mg/dL (ref <10)

## 2017-01-11 MED ORDER — LORAZEPAM 2 MG/ML IJ SOLN
0.5000 mg | Freq: Once | INTRAMUSCULAR | Status: AC
  Administered 2017-01-11: 0.5 mg via INTRAVENOUS
  Filled 2017-01-11: qty 1

## 2017-01-11 MED ORDER — SODIUM CHLORIDE 0.9 % IV SOLN
100.0000 mL/h | INTRAVENOUS | Status: DC
  Administered 2017-01-11: 100 mL/h via INTRAVENOUS

## 2017-01-11 MED ORDER — SODIUM CHLORIDE 0.9 % IV BOLUS (SEPSIS)
500.0000 mL | Freq: Once | INTRAVENOUS | Status: AC
  Administered 2017-01-11: 500 mL via INTRAVENOUS

## 2017-01-11 MED ORDER — ONDANSETRON 4 MG PO TBDP: 8.0000 mg | ORAL_TABLET | Freq: Once | ORAL | Status: DC

## 2017-01-11 NOTE — ED Notes (Signed)
Pt taken to MRI  

## 2017-01-11 NOTE — ED Notes (Signed)
Patient transported to CT 

## 2017-01-11 NOTE — ED Notes (Signed)
Patient transported to MRI 

## 2017-01-11 NOTE — ED Triage Notes (Signed)
[facility]- pt coming from home with family who reports pt has had slurred speech intermittently for an unknown period of time. Hx of stroke 2 years ago. Pt is also more confused than usual per family. Pt alert and oriented X2.

## 2017-01-11 NOTE — Discharge Instructions (Signed)
As discussed, with today's concern for speech changes it is important that you follow-up with your neurologist.  Your CT scan, and MRI have demonstrated that there is likely progression of your existing disease, and it is important that you take your medication as directed to minimize likelihood of continued progression.  Return here for concerning changes in your condition.

## 2017-01-11 NOTE — ED Notes (Signed)
Pt returned from ct

## 2017-01-11 NOTE — ED Provider Notes (Signed)
[facility] DEPT Provider Note   CSN: [id number] Arrival date & time: 01/11/17  1744     History   Chief Complaint Chief Complaint  Patient presents with  . Altered Mental Status    HPI Julia Floyd is a 83 y.o. female.  HPI  Patient presents with her grandmother daughter who assists with the history of present illness. Patient was last seen normal yesterday night, about 24 hours ago. She now presents after family members found her today with aphasia. The patient herself is awake and alert, attempting to provide history, but has difficulty pronouncing words, retelling her current situation. Level V caveat secondary to change in cognitive status. Family members note that the patient does have a history of prior stroke, as well as prior hemorrhage, possibly after taking stroke prophylaxis. Patient not currently taking any blood thinning medication. The patient herself can communicate with nodding, denies new focal weakness, denies pain.   Past Medical History:  Diagnosis Date  . DDD (degenerative disc disease)   . Depression   . Diabetes ([place])   . HTN (hypertension)   . Psychotic disorder ([place])    secondary to the below factors. 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 Bipolar disorder, most likely type 3, this episode being manic  . Sixth cranial nerve palsy   . Stroke ([place])   . Trigeminal neuralgia   . Uterine fibroid     Patient Active Problem List   Diagnosis Date Noted  . Benign essential HTN   . Neuropathic pain   . Urinary incontinence   . Constipation   . Diabetes mellitus type 2 in nonobese (HCC)   . Acute blood loss anemia   . Gait disturbance, post-stroke   . Intraparenchymal hemorrhage of brain ([place]) 10/14/2016  . Received tissue plasminogen activator (t-PA) less than 24 hours prior to arrival 10/09/2016  . ICH (intracerebral hemorrhage) ([place]) 10/09/2016  . Cytotoxic brain edema ([place]) 10/09/2016  . Stroke (cerebrum) ([place])  -  Suspect stroke/TIA s/p tPA with multifocal ICH - etiology unclear, concerning for underlying CAA 10/07/2016  . HTN (hypertension)   . Diabetes ([place])   . Depression   . DDD (degenerative disc disease)   . Uterine fibroid   . Trigeminal neuralgia   . Sixth cranial nerve palsy     Past Surgical History:  Procedure Laterality Date  . BRONCHOSCOPY    . TRACHEOSTOMY      OB History    No data available       Home Medications    Prior to Admission medications   Medication Sig Start Date End Date Taking? Authorizing Provider  amLODipine (NORVASC) 10 MG tablet Take 1 tablet (10 mg total) by mouth daily. 10/29/16  Yes Angiulli, Lavon Paganini, PA-C  cyanocobalamin 500 MCG tablet Take 1 tablet (500 mcg total) by mouth daily. Vitamin B12 10/29/16  Yes Angiulli, Lavon Paganini, PA-C  furosemide (LASIX) 20 MG tablet Take 1 tablet (20 mg total) by mouth daily. 10/29/16  Yes Angiulli, Lavon Paganini, PA-C  gabapentin (NEURONTIN) 100 MG capsule Take 1-2 capsules (100-200 mg total) by mouth at bedtime. 10/29/16  Yes Angiulli, Lavon Paganini, PA-C  metFORMIN (GLUCOPHAGE) 500 MG tablet Take 1 tablet (500 mg total) by mouth 2 (two) times daily with a meal. 10/29/16  Yes Angiulli, Lavon Paganini, PA-C  pravastatin (PRAVACHOL) 40 MG tablet Take 1 tablet (40 mg total) by mouth daily at 6 PM. 10/29/16  Yes Angiulli, Lavon Paganini, PA-C    Family History Family  History  Problem Relation Age of Onset  . Heart attack Unknown   . Diabetes Unknown     Social History Social History  Substance Use Topics  . Smoking status: Former Research scientist (life sciences)  . Smokeless tobacco: Never Used  . Alcohol use No     Allergies   Patient has no known allergies.   Review of Systems Review of Systems  Unable to perform ROS: Patient nonverbal     Physical Exam Updated Vital Signs BP (!) 157/67   Pulse 68   Temp 97.8 F (36.6 C) (Oral)   Resp 19   SpO2 100%   Physical Exam  Constitutional: She has a sickly appearance. No distress.  HENT:  Head:  Normocephalic and atraumatic.  Eyes: Conjunctivae and EOM are normal.  Cardiovascular: Normal rate and regular rhythm.   Pulmonary/Chest: Effort normal and breath sounds normal. No stridor. No respiratory distress.  Abdominal: She exhibits no distension.  Musculoskeletal: She exhibits no edema.  Neurological: She is alert. She displays atrophy. She displays no tremor. No cranial nerve deficit. She displays no seizure activity.  Patient is awake and alert, and through nodding, partially understandable verbal responses seems to be following commands appropriately, has no gross cranial nerve deficit, but does have difficulty with speech production.  Strength symmetric in all 4 extremities 4/5, consistent with age/atrophy.  Skin: Skin is warm and dry.  Psychiatric: Cognition and memory are impaired.  Nursing note and vitals reviewed.    ED Treatments / Results  Labs (all labs ordered are listed, but only abnormal results are displayed) Labs Reviewed  URINALYSIS, ROUTINE W REFLEX MICROSCOPIC - Abnormal; Notable for the following:       Result Value   Leukocytes, UA TRACE (*)    Squamous Epithelial / LPF 0-5 (*)    All other components within normal limits  COMPREHENSIVE METABOLIC PANEL - Abnormal; Notable for the following:    Glucose, Bld 146 (*)    Albumin 3.4 (*)    ALT 8 (*)    All other components within normal limits  CBC WITH DIFFERENTIAL/PLATELET - Abnormal; Notable for the following:    WBC 3.7 (*)    RBC 3.85 (*)    Hemoglobin 10.9 (*)    HCT 35.4 (*)    Platelets 121 (*)    All other components within normal limits  RAPID URINE DRUG SCREEN, HOSP PERFORMED  ETHANOL    EKG  EKG Interpretation  Date/Time:  Sunday January 11 2017 17:53:35 EDT Ventricular Rate:  77 PR Interval:    QRS Duration: 95 QT Interval:  387 QTC Calculation: 438 R Axis:   41 Text Interpretation:  Sinus rhythm Baseline wander in lead(s) II III aVF Artifact Abnormal ekg Confirmed by Carmin Muskrat [phone]) on 01/11/2017 7:05:44 PM       Radiology Ct Head Wo Contrast  Result Date: 01/11/2017 CLINICAL DATA:  Slurred speech. History of stroke 2 years ago. Increased confusion. EXAM: CT HEAD WITHOUT CONTRAST TECHNIQUE: Contiguous axial images were obtained from the base of the skull through the vertex without intravenous contrast. COMPARISON:  None. FINDINGS: Brain: Small areas of encephalomalacia from prior scattered subcortical hemorrhages, most prominent in the left cerebellum. No evidence of acute infarction or hemorrhage. No evidence of mass effect. Moderate brain parenchymal volume loss and periventricular microangiopathy. Vascular: Calcific atherosclerotic disease at the skullbase. Skull: Normal. Negative for fracture or focal lesion. Sinuses/Orbits: Partial opacification of the left ethmoid and sphenoid sinuses. Other: None. IMPRESSION: No acute intracranial abnormality.  Atrophy, chronic microvascular disease. Scattered small areas of subcortical encephalomalacia from prior hemorrhages. Electronically Signed   By: Fidela Salisbury M.D.   On: 01/11/2017 18:41   Mr Brain Wo Contrast (neuro Protocol)  Result Date: 01/11/2017 CLINICAL DATA:  83 y/o F; slurred intermittent speech. History of stroke 2 years ago. Increasing confusion. EXAM: MRI HEAD WITHOUT CONTRAST TECHNIQUE: Multiplanar, multiecho pulse sequences of the brain and surrounding structures were obtained without intravenous contrast. COMPARISON:  10/12/2016 MRI of the head.  01/11/2017 CT of the head. FINDINGS: Brain: Numerous foci of reduced diffusion are present in greatest concentration in subcortical white matter throughout the cerebral convexities. Additionally there are foci of susceptibility within the left inferomedial cerebellar hemisphere and sulci. Foci of susceptibility likely represents hemosiderin deposition from prior hemorrhage. The areas of hemorrhage no longer demonstrate T1 hyperintensity as seen on prior MRI  and are consistent with chronic etiology. No acute hemorrhage identified. Several reasons of hemorrhage demonstrate diffusion hyperintensity without definite reduced ADC likely related to susceptibility artifact. A few punctate foci of diffusion hyperintensity in watershed distribution are not associated with susceptibility hypointensity and may represent microvascular subacute infarctions. Scattered nonspecific foci of T2 FLAIR hyperintense signal abnormality in subcortical and periventricular white matter are compatible with chronic microvascular ischemic changes for age. Moderate brain parenchymal volume loss. No hydrocephalus, effacement of basilar cisterns, or extra-axial collection identified. Vascular: Normal flow voids. Skull and upper cervical spine: Normal marrow signal. Sinuses/Orbits: Negative. Other: None. IMPRESSION: 1. Numerous foci of chronic hemorrhage with a predominantly subcortical and peripheral distribution. The pattern favors amyloid angiopathy over hypertensive disease. No acute hemorrhage identified. 2. Few punctate foci of diffusion hyperintensity without definite restriction are present in watershed distribution without associated hemorrhage and may represents punctate microvascular subacute infarctions. Electronically Signed   By: Kristine Garbe M.D.   On: 01/11/2017 23:48    Procedures Procedures (including critical care time)  Medications Ordered in ED Medications  sodium chloride 0.9 % bolus 500 mL (0 mLs Intravenous Stopped 01/11/17 1854)    Followed by  0.9 %  sodium chloride infusion (100 mL/hr Intravenous New Bag/Given 01/11/17 1814)  ondansetron (ZOFRAN-ODT) disintegrating tablet 8 mg (not administered)  LORazepam (ATIVAN) injection 0.5 mg (0.5 mg Intravenous Given 01/11/17 2011)     Initial Impression / Assessment and Plan / ED Course  I have reviewed the triage vital signs and the nursing notes.  Pertinent labs & imaging results that were available  during my care of the patient were reviewed by me and considered in my medical decision making (see chart for details).   chart review notable for prior strokes, and hemorrhagic conversion os stroke after receiving tPa in July..   Ct Head Wo Contrast 10/08/2016 IMPRESSION: 1. Interval development of multifocal intraparenchymal hemorrhages status post tPA administration. No significant mass effect. 2. ASPECTS is 10    Ct Head Code Stroke W/o Cm 10/07/2016 IMPRESSION: 1. Negative for acute ischemic finding. Mild age related atrophy and chronic small vessel change of the white matter. 2. ASPECTS is 10    CT Head wo 10/09/2016 ; 1. Interval blooming of multifocal intraparenchymal hemorrhages as detailed above, increased in size relative to previous exam. Mild localized vasogenic edema without significant mass effect or midline shift. 2. No other new acute intracranial process.   Mri and Mrv Brain W Wo Contrast 10/12/2016 IMPRESSION: 1. Stable appearance of multiple scattered subcortical hemorrhages as previously described. 2. Surrounding vasogenic edema with some local mass effect, particularly in the left cerebellum.  There is no midline shift or herniation. 3. No new hemorrhages are present. 4. No pathologic enhancement. 5. Normal MR venogram of the head.    TTE - Left ventricle: The cavity size was normal. Wall thickness was   increased in a pattern of mild LVH. Systolic function was normal.   The estimated ejection fraction was in the range of 60% to 65%.   Wall motion was normal; there were no regional wall motion   abnormalities. Doppler parameters are consistent with abnormal   left ventricular relaxation (grade 1 diastolic dysfunction). - Mitral valve: Nodular thickening of the anterior leaflet.   Calcified annulus. - Left atrium: The atrium was mildly dilated. - Atrial septum: No defect or patent foramen ovale was identified. - Pulmonary arteries: PA peak pressure: 43 mm Hg  (S).   11:59 PM Patient asleep.  I discussed all results with the patient's grandaughter (the patient has noted dementia).  with no evidence for substantial new disease, but with findings consistent with progression of her existing disease, and no other gross deformities, no other focal complaints beyond change in speech pattern, patient is already appropriately medically managed for maximal stroke prevention, particularly given the patient's recent conversion to hemorrhagic stroke after receiving TPA within the past 6 months. Granddaughter and I had a lengthy conversation about this, and importance of taking medication as directed and following up with neurology as an outpatient.  Final Clinical Impressions(s) / ED Diagnoses   Final diagnoses:  Difficulty with speech     Carmin Muskrat, MD 01/12/17 0003

## 2017-01-11 NOTE — ED Notes (Signed)
Pt instructed to hit her call bell when she can provide urine sample

## 2017-01-12 NOTE — ED Notes (Signed)
Pt and family member understood dc material. NAD noted 

## 2017-01-13 ENCOUNTER — Emergency Department (HOSPITAL_COMMUNITY)
Admission: EM | Admit: 2017-01-13 | Discharge: 2017-01-14 | Disposition: A | Discharge status: Home / Self Care | Payer: Medicare Other | Attending: Emergency Medicine | Admitting: Emergency Medicine

## 2017-01-13 ENCOUNTER — Encounter (HOSPITAL_COMMUNITY): Payer: Self-pay | Admitting: Emergency Medicine

## 2017-01-13 DIAGNOSIS — I10 Essential (primary) hypertension: Secondary | ICD-10-CM | POA: Insufficient documentation

## 2017-01-13 DIAGNOSIS — S82851A Displaced trimalleolar fracture of right lower leg, initial encounter for closed fracture: Secondary | ICD-10-CM | POA: Insufficient documentation

## 2017-01-13 DIAGNOSIS — Z7984 Long term (current) use of oral hypoglycemic drugs: Secondary | ICD-10-CM | POA: Insufficient documentation

## 2017-01-13 DIAGNOSIS — G309 Alzheimer's disease, unspecified: Secondary | ICD-10-CM | POA: Diagnosis not present

## 2017-01-13 DIAGNOSIS — E119 Type 2 diabetes mellitus without complications: Secondary | ICD-10-CM | POA: Diagnosis not present

## 2017-01-13 DIAGNOSIS — S99911A Unspecified injury of right ankle, initial encounter: Secondary | ICD-10-CM | POA: Diagnosis present

## 2017-01-13 DIAGNOSIS — R1310 Dysphagia, unspecified: Secondary | ICD-10-CM | POA: Diagnosis not present

## 2017-01-13 DIAGNOSIS — Z8673 Personal history of transient ischemic attack (TIA), and cerebral infarction without residual deficits: Secondary | ICD-10-CM | POA: Diagnosis not present

## 2017-01-13 DIAGNOSIS — Z9889 Other specified postprocedural states: Secondary | ICD-10-CM

## 2017-01-13 DIAGNOSIS — R338 Other retention of urine: Secondary | ICD-10-CM | POA: Diagnosis not present

## 2017-01-13 DIAGNOSIS — IMO0001 Reserved for inherently not codable concepts without codable children: Secondary | ICD-10-CM

## 2017-01-13 DIAGNOSIS — Y998 Other external cause status: Secondary | ICD-10-CM | POA: Diagnosis not present

## 2017-01-13 DIAGNOSIS — W050XXA Fall from non-moving wheelchair, initial encounter: Secondary | ICD-10-CM | POA: Insufficient documentation

## 2017-01-13 DIAGNOSIS — S9304XA Dislocation of right ankle joint, initial encounter: Secondary | ICD-10-CM | POA: Diagnosis not present

## 2017-01-13 DIAGNOSIS — F0280 Dementia in other diseases classified elsewhere without behavioral disturbance: Secondary | ICD-10-CM | POA: Diagnosis present

## 2017-01-13 DIAGNOSIS — Y939 Activity, unspecified: Secondary | ICD-10-CM | POA: Insufficient documentation

## 2017-01-13 DIAGNOSIS — Y92122 Bedroom in nursing home as the place of occurrence of the external cause: Secondary | ICD-10-CM | POA: Insufficient documentation

## 2017-01-13 DIAGNOSIS — R339 Retention of urine, unspecified: Secondary | ICD-10-CM | POA: Insufficient documentation

## 2017-01-13 NOTE — ED Triage Notes (Signed)
Per [facility], Pt from home with daughter. Daughter was assisting pt to bed when pt's legs gave out and she slipped out of her  daughters arms. Denies hitting head or LOC Pt has deformity to R ankle. Pt has +2 pitting edema to both feet. Pt has +2 dorsalis pedis pulse. Pt alert and oriented to self, situation and place, disoriented to time.

## 2017-01-14 ENCOUNTER — Emergency Department (HOSPITAL_COMMUNITY): Payer: Medicare Other

## 2017-01-14 DIAGNOSIS — Z8673 Personal history of transient ischemic attack (TIA), and cerebral infarction without residual deficits: Secondary | ICD-10-CM

## 2017-01-14 DIAGNOSIS — R1310 Dysphagia, unspecified: Secondary | ICD-10-CM

## 2017-01-14 DIAGNOSIS — G309 Alzheimer's disease, unspecified: Secondary | ICD-10-CM

## 2017-01-14 DIAGNOSIS — S9304XA Dislocation of right ankle joint, initial encounter: Secondary | ICD-10-CM | POA: Diagnosis not present

## 2017-01-14 DIAGNOSIS — F0280 Dementia in other diseases classified elsewhere without behavioral disturbance: Secondary | ICD-10-CM | POA: Diagnosis not present

## 2017-01-14 DIAGNOSIS — R338 Other retention of urine: Secondary | ICD-10-CM

## 2017-01-14 LAB — BASIC METABOLIC PANEL
Anion gap: 8 (ref 5–15)
BUN: 11 mg/dL (ref 6–20)
CO2: 24 mmol/L (ref 22–32)
CREATININE: 0.96 mg/dL (ref 0.44–1.00)
Calcium: 9.5 mg/dL (ref 8.9–10.3)
Chloride: 108 mmol/L (ref 101–111)
GFR calc Af Amer: >60 mL/min (ref >60)
GFR, EST NON AFRICAN AMERICAN: 53 mL/min — AB (ref >60)
GLUCOSE: 93 mg/dL (ref 65–99)
Potassium: 3.7 mmol/L (ref 3.5–5.1)
SODIUM: 140 mmol/L (ref 135–145)

## 2017-01-14 LAB — CBC
HCT: 35.8 % — ABNORMAL LOW (ref 36.0–46.0)
Hemoglobin: 10.9 g/dL — ABNORMAL LOW (ref 12.0–15.0)
MCH: 28.1 pg (ref 26.0–34.0)
MCHC: 30.4 g/dL (ref 30.0–36.0)
MCV: 92.3 fL (ref 78.0–100.0)
PLATELETS: 130 10*3/uL — AB (ref 150–400)
RBC: 3.88 MIL/uL (ref 3.87–5.11)
RDW: 15.3 % (ref 11.5–15.5)
WBC: 5.1 10*3/uL (ref 4.0–10.5)

## 2017-01-14 LAB — URINALYSIS, ROUTINE W REFLEX MICROSCOPIC
BILIRUBIN URINE: NEGATIVE
Glucose, UA: NEGATIVE mg/dL
HGB URINE DIPSTICK: NEGATIVE
KETONES UR: NEGATIVE mg/dL
Leukocytes, UA: NEGATIVE
Nitrite: NEGATIVE
PH: 6 (ref 5.0–8.0)
Protein, ur: NEGATIVE mg/dL
SPECIFIC GRAVITY, URINE: 1.012 (ref 1.005–1.030)

## 2017-01-14 MED ORDER — ETOMIDATE 2 MG/ML IV SOLN
10.0000 mg | Freq: Once | INTRAVENOUS | Status: DC
Start: 2017-01-14 — End: 2017-01-14
  Filled 2017-01-14: qty 10

## 2017-01-14 MED ORDER — OXYCODONE-ACETAMINOPHEN 5-325 MG PO TABS
0.5000 | ORAL_TABLET | ORAL | 0 refills | Status: DC | PRN
Start: 2017-01-14 — End: 2017-01-14

## 2017-01-14 MED ORDER — OXYCODONE-ACETAMINOPHEN 5-325 MG PO TABS
0.5000 | ORAL_TABLET | Freq: Once | ORAL | Status: AC
  Administered 2017-01-14: 0.5 via ORAL
  Filled 2017-01-14: qty 1

## 2017-01-14 MED ORDER — ETOMIDATE 2 MG/ML IV SOLN
INTRAVENOUS | Status: DC | PRN
  Administered 2017-01-14: 10 mg via INTRAVENOUS

## 2017-01-14 MED ORDER — OXYCODONE-ACETAMINOPHEN 5-325 MG PO TABS: 0.5000 | ORAL_TABLET | ORAL | 0 refills | Status: DC | PRN

## 2017-01-14 MED ORDER — OXYCODONE-ACETAMINOPHEN 5-325 MG PO TABS
1.0000 | ORAL_TABLET | Freq: Once | ORAL | Status: AC
  Administered 2017-01-14: 1 via ORAL
  Filled 2017-01-14: qty 1

## 2017-01-14 MED ORDER — SULFAMETHOXAZOLE-TRIMETHOPRIM 200-40 MG/5ML PO SUSP: 10.0000 mL | Freq: Two times a day (BID) | ORAL | 0 refills | Status: AC

## 2017-01-14 NOTE — ED Notes (Signed)
Pt given [place] sandwich, applesauce and OJ.

## 2017-01-14 NOTE — Progress Notes (Signed)
Pt is now being admitted to the facility. CSW attempted to  update [name] from [facility] of this at this time. CSW will continue to assists with discharge needs.   Julia Floyd Julia Floyd, MSW, [facility] Clinical Social Worker [phone]

## 2017-01-14 NOTE — Discharge Planning (Addendum)
Spoke with pt and family at bedside regarding disposition needs. Pt is from home with daughter, who has suffered a stroke and can no longer care for pt as before.  Pt was able to transfer self and aid with her own care but has been declining in recent weeks/days. Pt and family initially  Wanted home health services but after finding out that [facility] is not 12-16 hours daily, pt and family decided to explore SNF route.    PT and SW consult made.

## 2017-01-14 NOTE — ED Notes (Signed)
Pt bladder scanned per Granddaughter's request because she hasn't voided in over 8 hours. Bladder scan revealed greater >773ml. New orders received from Dr. Laverta Baltimore to in and out cath. Send UA.

## 2017-01-14 NOTE — ED Provider Notes (Addendum)
Blood pressure (!) 150/80, pulse 68, temperature 98 F (36.7 C), temperature source Oral, resp. rate 20, SpO2 100 %.  Assuming care from Dr. Betsey Holiday.  In short, Julia Floyd is a 83 y.o. female with a chief complaint of Ankle Pain (deformity) .  Refer to the original H&P for additional details.  The current plan of care is to follow up with case manager recommendation.  11:38 AM Patient has been accepted at local ALF/SNF. Plan for discharge at 2 PM. Pain medication prescribed by Dr. Betsey Holiday. Patient in no distress and ready for discharge at 2 PM. Patient to call Dr. Sharol Given to schedule outpatient appointment.   02:52 PM Patient with worsening pain in the ankle. A questions if oral pain medication will control her pain at this time. She does have placement at SNF at this time but patient appears to also have urinary retention. This was discovered on bedside ultrasound after the patient had not urinated throughout the day. She had retained approximately 800 mils of urine. In out catheter was performed and this is being sent for UA. Patient also having some dysphagia. She was eating a sandwich and was unable to swallow this. This is a new problem for her. She has recent MRI with chronic microvascular changes only. No focal deficits to prompt repeat MRI at this time. Patient will need a swallow evaluation. Went to discuss admission with the hospitalist for pain control, swallow eval, and workup of urinary retention. I also paged the orthopedic surgeon on call to discuss the ankle fracture reduced by prior EDP.   03:46 PM Patient evaluated by Hospitalist. See consult note. Plan for indwelling foley x 2 weeks with 1/2 dose Bactrim for UTI prevention. Patient to stay on a mechanical soft diet at SNF and can have PT/OT/Speech evaluation there.   Plan for discharge at this time.    Margette Fast, MD 01/14/17 1140    Margette Fast, MD 01/14/17 [phone]

## 2017-01-14 NOTE — Progress Notes (Addendum)
10:43: CSW spoke with pt's granddaughter at bedside and updated her of placement status at this time. CSW asked for permission to seek out other facilites for pt just in case [facility] is unable to meet the needs of pt at this time. CSW received permission and faxed pt out to to other [facility] at this time.  CSW spoke with [name] from [facility] and was informed that they are still reviewing pt's information. CSW was informed that karen would call CSW back in about 15 minutes with updates.     Virgie Dad Karagan Lehr, MSW, [place] Clinical Social Worker [phone]

## 2017-01-14 NOTE — Progress Notes (Signed)
CSW has faxed over AVS and medication list provided by th eED doctor to facility.     Virgie Dad Tashera Montalvo, MSW, [facility] Clinical Social Worker [phone]

## 2017-01-14 NOTE — Progress Notes (Signed)
22Orthopedic Tech Progress Note Patient Details:  Julia Floyd Oct 10, 1930 [id number]  Ortho Devices Type of Ortho Device: Post (short leg) splint, Stirrup splint Ortho Device/Splint Location: rle Ortho Device/Splint Interventions: Ordered, Application, Adjustment I applied a posterior and stirrup with splint to patient after dr reduced the ankle. Applied as per drs verbal order.  Karolee Stamps 01/14/2017, 3:52 AM

## 2017-01-14 NOTE — Progress Notes (Addendum)
CSW updated RN, doctor, and Granddaughter at bedside on plan. Pt will be transported to [facility] at 2pm today. RN to call report to [phone].Pt will be in room 123B CSW to set up [place] for 2pm.      Virgie Dad. Seiya Silsby, MSW, [facility] [place] [facility] Clinical Social Worker [phone]

## 2017-01-14 NOTE — Progress Notes (Addendum)
CSW spoke with family at bedside. Pt is from home and daughter reports not being able to care for pt any  Longer due to daughters on medical needs (daughtger had a stroke).  Pt and daughter are wanting placement at [facility]. CSW has spoken with [name] from [facility] and has begun the process. CSW will continue to follow for discharge needs. CSW has also updated RN and doctor of plan at this time.    Virgie Dad Gurley Climer, MSW, [facility] Clinical Social Worker [phone]

## 2017-01-14 NOTE — ED Notes (Signed)
[facility]AR at bedside for transport.  

## 2017-01-14 NOTE — NC FL2 (Signed)
[place] MEDICAID FL2 LEVEL OF CARE SCREENING TOOL     IDENTIFICATION  Patient Name: Julia Floyd Birthdate: 08-Apr-1930 Sex: female Admission Date (Current Location): 01/13/2017  [facility] [place] Number:  Herbalist and Address:  [facility] [place] [address] [place], [place] [address]      Provider Number: [id number]  Attending Physician Name and Address:  Margette Fast, MD  Relative Name and Phone Number:       Current Level of Care: Hospital Recommended Level of Care: [place] Prior Approval Number:    Date Approved/Denied:   PASRR Number:   [id number]  Discharge Plan: SNF    Current Diagnoses: Patient Active Problem List   Diagnosis Date Noted  . Benign essential HTN   . Neuropathic pain   . Urinary incontinence   . Constipation   . Diabetes mellitus type 2 in nonobese (HCC)   . Acute blood loss anemia   . Gait disturbance, post-stroke   . Intraparenchymal hemorrhage of brain ([place]) 10/14/2016  . Received tissue plasminogen activator (t-PA) less than 24 hours prior to arrival 10/09/2016  . ICH (intracerebral hemorrhage) ([place]) 10/09/2016  . Cytotoxic brain edema ([place]) 10/09/2016  . Stroke (cerebrum) ([place]) -  Suspect stroke/TIA s/p tPA with multifocal ICH - etiology unclear, concerning for underlying CAA 10/07/2016  . HTN (hypertension)   . Diabetes ([place])   . Depression   . DDD (degenerative disc disease)   . Uterine fibroid   . Trigeminal neuralgia   . Sixth cranial nerve palsy     Orientation RESPIRATION BLADDER Height & Weight     Self, Situation, Place  Normal (per notes pt is on room air. ) Incontinent Weight:   Height:     BEHAVIORAL SYMPTOMS/MOOD NEUROLOGICAL BOWEL NUTRITION STATUS      Incontinent Diet (please see discharge summary.)  AMBULATORY STATUS COMMUNICATION OF NEEDS Skin   Extensive Assist   Normal                       Personal Care Assistance Level of Assistance   Bathing, Feeding, Dressing Bathing Assistance: Maximum assistance Feeding assistance: Maximum assistance Dressing Assistance: Maximum assistance     Functional Limitations Info  Sight, Hearing, Speech Sight Info: Adequate Hearing Info: Adequate Speech Info: Adequate    SPECIAL CARE FACTORS FREQUENCY  PT (By licensed PT), OT (By licensed OT)     PT Frequency: 5 times a week  OT Frequency: 5 times a week.             Contractures Contractures Info: Not present    Additional Factors Info  Code Status, Allergies Code Status Info: Prior Allergies Info: NKA           Current Medications (01/14/2017):  This is the current hospital active medication list Current Facility-Administered Medications  Medication Dose Route Frequency Provider Last Rate Last Dose  . etomidate (AMIDATE) injection 10 mg  10 mg Intravenous Once Pollina, Gwenyth Allegra, MD      . etomidate (AMIDATE) injection   Intravenous PRN Orpah Greek, MD   10 mg at 01/14/17 0237  . oxyCODONE-acetaminophen (PERCOCET/ROXICET) 5-325 MG per tablet 0.5 tablet  0.5 tablet Oral Once Long, Wonda Olds, MD       Current Outpatient Prescriptions  Medication Sig Dispense Refill  . amLODipine (NORVASC) 10 MG tablet Take 1 tablet (10 mg total) by mouth daily. 30 tablet 0  . cyanocobalamin 500  MCG tablet Take 1 tablet (500 mcg total) by mouth daily. Vitamin B12 30 tablet 0  . furosemide (LASIX) 20 MG tablet Take 1 tablet (20 mg total) by mouth daily. 30 tablet 0  . gabapentin (NEURONTIN) 100 MG capsule Take 1-2 capsules (100-200 mg total) by mouth at bedtime. 30 capsule 0  . metFORMIN (GLUCOPHAGE) 500 MG tablet Take 1 tablet (500 mg total) by mouth 2 (two) times daily with a meal. 60 tablet 0  . pravastatin (PRAVACHOL) 40 MG tablet Take 1 tablet (40 mg total) by mouth daily at 6 PM. 30 tablet 0  . oxyCODONE-acetaminophen (PERCOCET) 5-325 MG tablet Take 0.5 tablets by mouth every 4 (four) hours as needed. 20 tablet 0      Discharge Medications: Please see discharge summary for a list of discharge medications.  Relevant Imaging Results:  Relevant Lab Results:   Additional Information SSN- [id number]  Wetzel Bjornstad, LCSWA

## 2017-01-14 NOTE — Evaluation (Signed)
Physical Therapy Evaluation Patient Details Name: Julia Floyd MRN: [id number] DOB: 21-Oct-1930 Today's Date: 01/14/2017   History of Present Illness  Pt is an 82 y/o female presenting to the ED after a fall with R ankle deformity. Imaging showed posterior dislocation of R ankle (reduced in the ED), R posterior malleolus fracture on the tibia, and probable avulsion fractures of anterior tibea and cuboidal bone. PMH includes DM, trigeminal neuralgia, CVA, dementia, HTN, and depression.   Clinical Impression  Pt presenting to ED with problem above and deficits below. PTA, pt was able to transfer to [facility] by herself, however, grandaughter reports pt has been requiring increased assist for transfers. Upon eval, pt very limited by pain. Only able to sit at EOB for short period before requesting to return to bed secondary to pain. Required max A for basic bed mobility. Per chart and family, pt's daughter unable to assist with mobility secondary to her own health problems. Recommending SNF at d/c to increase independence and safety with functional mobility. Will continue to follow acutely and progress mobility according to pt tolerance.     Follow Up Recommendations SNF;Supervision/Assistance - 24 hour    Equipment Recommendations  None recommended by PT    Recommendations for Other Services       Precautions / Restrictions Precautions Precautions: Fall Required Braces or Orthoses: Other Brace/Splint Other Brace/Splint: Splint on R ankle  Restrictions Weight Bearing Restrictions: Yes RLE Weight Bearing: Non weight bearing Other Position/Activity Restrictions: No WB orders in chart and MD unavailable, so maintained NWB on the R.       Mobility  Bed Mobility Overal bed mobility: Needs Assistance Bed Mobility: Supine to Sit;Sit to Supine     Supine to sit: Max assist Sit to supine: Max assist   General bed mobility comments: Max A for assist with scooting hips to EOB using bed pad  and for trunk elevation. Upon sitting, pt in increased pain and requesting to lie back down. Max A for return to supine to assist with trunk lowering and LE lift assist.   Transfers                 General transfer comment: Unable to attempt secondary to pain.   Ambulation/Gait                Stairs            Wheelchair Mobility    Modified Rankin (Stroke Patients Only)       Balance Overall balance assessment: Needs assistance Sitting-balance support: Bilateral upper extremity supported;Feet unsupported Sitting balance-Leahy Scale: Poor Sitting balance - Comments: Required at least min A to maintain sitting balance.                                      Pertinent Vitals/Pain Pain Assessment: 0-10 Pain Score: 10-Worst pain ever Pain Location: R ankle  Pain Descriptors / Indicators: Constant;Grimacing Pain Intervention(s): Limited activity within patient's tolerance;Monitored during session;Repositioned    Home Living Family/patient expects to be discharged to:: Skilled nursing facility                      Prior Function Level of Independence: Needs assistance   Gait / Transfers Assistance Needed: per family, pt performed sit<>stand and stand-pivot transfers with mod I  ADL's / Homemaking Assistance Needed: assist required with ADLs  Hand Dominance   Dominant Hand: Right    Extremity/Trunk Assessment   Upper Extremity Assessment Upper Extremity Assessment: Generalized weakness    Lower Extremity Assessment Lower Extremity Assessment: Generalized weakness;RLE deficits/detail RLE Deficits / Details: Increased pain and immobilization limited formal MMT. Noted functional weakness throughout session.     Cervical / Trunk Assessment Cervical / Trunk Assessment: Kyphotic  Communication   Communication: No difficulties  Cognition Arousal/Alertness: Awake/alert Behavior During Therapy: WFL for tasks  assessed/performed Overall Cognitive Status: History of cognitive impairments - at baseline                                 General Comments: Dementia at baseline       General Comments General comments (skin integrity, edema, etc.): Pt's grandaughter present during session and assisted with PLOF information. Daughter is unable to care for pt as she used to because she has had a CVA as well. Educated about SNF at [employer] and pt agreeable.     Exercises     Assessment/Plan    PT Assessment Patient needs continued PT services  PT Problem List Decreased strength;Decreased range of motion;Decreased activity tolerance;Decreased balance;Decreased mobility;Decreased cognition;Decreased knowledge of use of DME;Decreased knowledge of precautions;Pain       PT Treatment Interventions DME instruction;Functional mobility training;Therapeutic activities;Therapeutic exercise;Balance training;Neuromuscular re-education;Patient/family education;Cognitive remediation;Wheelchair mobility training    PT Goals (Current goals can be found in the Care Plan section)  Acute Rehab PT Goals Patient Stated Goal: to decrease pain  PT Goal Formulation: With patient/family Time For Goal Achievement: 01/21/17 Potential to Achieve Goals: Fair    Frequency Min 2X/week   Barriers to discharge Other (comment) Daughter unable to care for her as she was before.     Co-evaluation               AM-PAC PT "6 Clicks" Daily Activity  Outcome Measure Difficulty turning over in bed (including adjusting bedclothes, sheets and blankets)?: Unable Difficulty moving from lying on back to sitting on the side of the bed? : Unable Difficulty sitting down on and standing up from a chair with arms (e.g., wheelchair, bedside commode, etc,.)?: Unable Help needed moving to and from a bed to chair (including a wheelchair)?: Total Help needed walking in hospital room?: Total Help needed climbing 3-5 steps  with a railing? : Total 6 Click Score: 6    End of Session   Activity Tolerance: Patient limited by pain Patient left: in bed;with call bell/phone within reach;with family/visitor present Nurse Communication: Mobility status PT Visit Diagnosis: History of falling (Z91.81);Muscle weakness (generalized) (M62.81);Difficulty in walking, not elsewhere classified (R26.2);Pain Pain - Right/Left: Right Pain - part of body: Ankle and joints of foot    Time: 1053-1107 PT Time Calculation (min) (ACUTE ONLY): 14 min   Charges:   PT Evaluation $PT Eval Moderate Complexity: 1 Mod     PT G Codes:   PT G-Codes **NOT FOR INPATIENT CLASS** Functional Assessment Tool Used: AM-PAC 6 Clicks Basic Mobility Functional Limitation: Mobility: Walking and moving around Mobility: Walking and Moving Around Current Status (I2979): 100 percent impaired, limited or restricted Mobility: Walking and Moving Around Goal Status (G9211): At least 80 percent but less than 100 percent impaired, limited or restricted    Leighton Ruff, PT, DPT  Acute Rehabilitation Services  Pager: [place] 01/14/2017, 11:26 AM

## 2017-01-14 NOTE — ED Notes (Signed)
Pts daughter states patient had difficulty swallowing while eating [place] sandwich. Dr. Laverta Baltimore notified. Will keep pt NPO and order swallow screen.

## 2017-01-14 NOTE — ED Provider Notes (Signed)
[facility] EMERGENCY DEPARTMENT Provider Note   CSN: [id number] Arrival date & time: 01/13/17  2339     History   Chief Complaint Chief Complaint  Patient presents with  . Ankle Pain    deformity    HPI Julia Floyd is a 83 y.o. female.  Patient presents to the emergency department for evaluation after a fall. Patient has had frequent falls recently.She no longer can ambulate, daughter was trying to get her from the bed to a wheelchair to go to the bathroom when she slipped and fell. Daughter was unable to get her up off the floor. Daughter did not notice any injury initially, call EMS for assistance. EMS determined that she had a deformity of her right ankle upon their survey, brought her to the ER for evaluation. Patient reports that her ankle is "sore" at arrival, no other complaints.      Past Medical History:  Diagnosis Date  . DDD (degenerative disc disease)   . Depression   . Diabetes ([place])   . HTN (hypertension)   . Psychotic disorder ([place])    secondary to the below factors. 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 Bipolar disorder, most likely type 3, this episode being manic  . Sixth cranial nerve palsy   . Stroke ([place])   . Trigeminal neuralgia   . Uterine fibroid     Patient Active Problem List   Diagnosis Date Noted  . Benign essential HTN   . Neuropathic pain   . Urinary incontinence   . Constipation   . Diabetes mellitus type 2 in nonobese (HCC)   . Acute blood loss anemia   . Gait disturbance, post-stroke   . Intraparenchymal hemorrhage of brain ([place]) 10/14/2016  . Received tissue plasminogen activator (t-PA) less than 24 hours prior to arrival 10/09/2016  . ICH (intracerebral hemorrhage) ([place]) 10/09/2016  . Cytotoxic brain edema ([place]) 10/09/2016  . Stroke (cerebrum) ([place]) -  Suspect stroke/TIA s/p tPA with multifocal ICH - etiology unclear, concerning for underlying CAA 10/07/2016  . HTN (hypertension)     . Diabetes ([place])   . Depression   . DDD (degenerative disc disease)   . Uterine fibroid   . Trigeminal neuralgia   . Sixth cranial nerve palsy     Past Surgical History:  Procedure Laterality Date  . BRONCHOSCOPY    . TRACHEOSTOMY      OB History    No data available       Home Medications    Prior to Admission medications   Medication Sig Start Date End Date Taking? Authorizing Provider  amLODipine (NORVASC) 10 MG tablet Take 1 tablet (10 mg total) by mouth daily. 10/29/16  Yes Angiulli, Lavon Paganini, PA-C  cyanocobalamin 500 MCG tablet Take 1 tablet (500 mcg total) by mouth daily. Vitamin B12 10/29/16  Yes Angiulli, Lavon Paganini, PA-C  furosemide (LASIX) 20 MG tablet Take 1 tablet (20 mg total) by mouth daily. 10/29/16  Yes Angiulli, Lavon Paganini, PA-C  gabapentin (NEURONTIN) 100 MG capsule Take 1-2 capsules (100-200 mg total) by mouth at bedtime. 10/29/16  Yes Angiulli, Lavon Paganini, PA-C  metFORMIN (GLUCOPHAGE) 500 MG tablet Take 1 tablet (500 mg total) by mouth 2 (two) times daily with a meal. 10/29/16  Yes Angiulli, Lavon Paganini, PA-C  pravastatin (PRAVACHOL) 40 MG tablet Take 1 tablet (40 mg total) by mouth daily at 6 PM. 10/29/16  Yes Angiulli, Lavon Paganini, PA-C    Family History Family History  Problem  Relation Age of Onset  . Heart attack Unknown   . Diabetes Unknown     Social History Social History  Substance Use Topics  . Smoking status: Former Research scientist (life sciences)  . Smokeless tobacco: Never Used  . Alcohol use No     Allergies   Patient has no known allergies.   Review of Systems Review of Systems  Musculoskeletal: Positive for arthralgias.  Neurological: Positive for weakness.  All other systems reviewed and are negative.    Physical Exam Updated Vital Signs BP (!) 155/60   Pulse 71   Temp 98 F (36.7 C) (Oral)   Resp 19   SpO2 99%   Physical Exam  Constitutional: She is oriented to person, place, and time. She appears well-developed and well-nourished. No distress.  HENT:   Head: Normocephalic and atraumatic.  Right Ear: Hearing normal.  Left Ear: Hearing normal.  Nose: Nose normal.  Mouth/Throat: Oropharynx is clear and moist and mucous membranes are normal.  Eyes: Pupils are equal, round, and reactive to light. Conjunctivae and EOM are normal.  Neck: Normal range of motion. Neck supple.  Cardiovascular: Regular rhythm, S1 normal and S2 normal.  Exam reveals no gallop and no friction rub.   No murmur heard. Pulmonary/Chest: Effort normal and breath sounds normal. No respiratory distress. She exhibits no tenderness.  Abdominal: Soft. Normal appearance and bowel sounds are normal. There is no hepatosplenomegaly. There is no tenderness. There is no rebound, no guarding, no tenderness at McBurney's point and negative Murphy's sign. No hernia.  Musculoskeletal:       Right ankle: She exhibits decreased range of motion, swelling and deformity. Tenderness.  Neurological: She is alert and oriented to person, place, and time. She has normal strength. No cranial nerve deficit or sensory deficit. Coordination normal. GCS eye subscore is 4. GCS verbal subscore is 5. GCS motor subscore is 6.  Skin: Skin is warm, dry and intact. No rash noted. No cyanosis.  Psychiatric: She has a normal mood and affect. Her speech is normal and behavior is normal. Thought content normal.  Nursing note and vitals reviewed.    ED Treatments / Results  Labs (all labs ordered are listed, but only abnormal results are displayed) Labs Reviewed  CBC - Abnormal; Notable for the following:       Result Value   Hemoglobin 10.9 (*)    HCT 35.8 (*)    Platelets 130 (*)    All other components within normal limits  BASIC METABOLIC PANEL - Abnormal; Notable for the following:    GFR calc non Af Amer 53 (*)    All other components within normal limits    EKG  EKG Interpretation None       Radiology Dg Chest 1 View  Result Date: 01/14/2017 CLINICAL DATA:  Pain after a fall. EXAM:  CHEST 1 VIEW COMPARISON:  08/18/2016 FINDINGS: Mild cardiac enlargement. No vascular congestion. No edema or consolidation. No blunting of costophrenic angles. No pneumothorax. IMPRESSION: Mild cardiac enlargement.  No evidence of active pulmonary disease. Electronically Signed   By: Lucienne Capers M.D.   On: 01/14/2017 00:59   Dg Pelvis 1-2 Views  Result Date: 01/14/2017 CLINICAL DATA:  Pain after a fall. EXAM: PELVIS - 1-2 VIEW COMPARISON:  None. FINDINGS: Diffuse bone demineralization. Pelvis and hips appear intact. No evidence of acute fracture or dislocation. No focal bone lesion or bone destruction. Degenerative changes in the lower lumbar spine. Multiple calcifications in the pelvis likely representing calcified fibroids. IMPRESSION:  No acute bony abnormalities. Multiple large calcified fibroids in the pelvis. Electronically Signed   By: Lucienne Capers M.D.   On: 01/14/2017 00:58   Dg Ankle Complete Right  Result Date: 01/14/2017 CLINICAL DATA:  Pain after a fall. EXAM: RIGHT ANKLE - COMPLETE 3+ VIEW COMPARISON:  None. FINDINGS: There is complete posterior dislocation of the talus with respect to the tibia and fibula. There is a displaced posterior malleolar fracture. Likely avulsion fracture off of the anterior aspect of the distal tibia. The distal fibula is not well demonstrated in can't exclude a fracture here either. Small osseous fragments anterior to the cuboidal bone likely representing avulsion fragments. Degenerative changes in the intertarsal joints. Soft tissue swelling. Vascular calcifications. IMPRESSION: Complete posterior dislocation of the ankle. Displaced fracture fragment off of the posterior malleolus of the distal tibia. Probable avulsion fractures off of the anterior tibia and cuboidal bone. Electronically Signed   By: Lucienne Capers M.D.   On: 01/14/2017 01:01   Dg Ankle Right Port  Result Date: 01/14/2017 CLINICAL DATA:  83 y/o  F; post reduction of right ankle.  EXAM: PORTABLE RIGHT ANKLE - 2 VIEW COMPARISON:  01/14/2017 right ankle radiographs. FINDINGS: Interval reduction of trimalleolar ankle fracture with near anatomic alignment. Fine bony details are obscured by the overlying cast. Ankle mortise is symmetric on these nonstress views. IMPRESSION: Interval reduction of trimalleolar fracture with near anatomic alignment. Symmetric ankle mortise on these nonstress views. Fine bony details obscured by overlying cast. Electronically Signed   By: Kristine Garbe M.D.   On: 01/14/2017 03:21    Procedures .Sedation Date/Time: 01/14/2017 7:00 AM Performed by: Orpah Greek Authorized by: Orpah Greek   Consent:    Consent obtained:  Written   Consent given by: daughter.   Risks discussed:  Prolonged hypoxia resulting in organ damage, prolonged sedation necessitating reversal, respiratory compromise necessitating ventilatory assistance and intubation, nausea, vomiting and inadequate sedation Universal protocol:    Procedure explained and questions answered to patient or proxy's satisfaction: yes     Relevant documents present and verified: yes     Test results available and properly labeled: yes     Imaging studies available: yes     Required blood products, implants, devices, and special equipment available: yes     Site/side marked: yes     Immediately prior to procedure a time out was called: yes     Patient identity confirmation method:  Verbally with patient and hospital-assigned identification number Indications:    Procedure performed:  Fracture reduction   Procedure necessitating sedation performed by:  Physician performing sedation   Intended level of sedation:  Moderate (conscious sedation) Pre-sedation assessment:    Time since last food or drink:  10   ASA classification: class 3 - patient with severe systemic disease     Neck mobility: reduced     Mouth opening:  3 or more finger widths   Mallampati score:   III - soft palate, base of uvula visible   Pre-sedation assessments completed and reviewed: airway patency and mental status     Pre-sedation assessments completed and reviewed: nausea/vomiting not reviewed     Pre-sedation assessment completed:  01/14/2017 2:30 AM Immediate pre-procedure details:    Reassessment: Patient reassessed immediately prior to procedure     Reviewed: vital signs, relevant labs/tests and NPO status     Verified: bag valve mask available, emergency equipment available, intubation equipment available, IV patency confirmed and oxygen available   Procedure  details (see MAR for exact dosages):    Preoxygenation:  Nasal cannula   Sedation:  Etomidate   Intra-procedure monitoring:  Blood pressure monitoring, frequent LOC assessments, continuous capnometry, frequent vital sign checks, continuous pulse oximetry and cardiac monitor   Intra-procedure events: none     Total Provider sedation time (minutes):  14 Post-procedure details:    Post-sedation assessment completed:  01/14/2017 2:50 AM   Attendance: Constant attendance by certified staff until patient recovered     Recovery: Patient returned to pre-procedure baseline     Post-sedation assessments completed and reviewed: airway patency and mental status     Post-sedation assessments completed and reviewed: hydration status not reviewed, nausea/vomiting not reviewed and pain level not reviewed     Patient is stable for discharge or admission: yes     Patient tolerance:  Tolerated well, no immediate complications ORTHOPEDIC INJURY TREATMENT Date/Time: 01/14/2017 7:04 AM Performed by: Orpah Greek Authorized by: Orpah Greek   Consent:    Consent obtained:  Written   Consent given by: daughter.   Risks discussed:  Restricted joint movement, vascular damage, irreducible dislocation and recurrent dislocation Universal protocol:    Procedure explained and questions answered to patient or proxy's  satisfaction: yes     Relevant documents present and verified: yes     Test results available and properly labeled: yes     Imaging studies available: yes     Required blood products, implants, devices, and special equipment available: yes     Site/side marked: yes     Patient identity confirmed:  Verbally with patient and arm bandInjury location: ankle Location details: right ankle Injury type: fracture-dislocation Pre-procedure neurovascular assessment: neurovascularly intact Pre-procedure distal perfusion: normal Pre-procedure neurological function: normal Pre-procedure range of motion: reduced  Patient sedated: Yes. Refer to sedation procedure documentation for details of sedation. Manipulation performed: yes Skeletal traction used: yes Reduction successful: yes X-ray confirmed reduction: yes Immobilization: splint Splint type: short leg Supplies used: Ortho-Glass Post-procedure neurovascular assessment: post-procedure neurovascularly intact Post-procedure distal perfusion: normal Post-procedure neurological function: normal Post-procedure range of motion: normal Patient tolerance: Patient tolerated the procedure well with no immediate complications    (including critical care time)  Medications Ordered in ED Medications  etomidate (AMIDATE) injection 10 mg (not administered)  etomidate (AMIDATE) injection (10 mg Intravenous Given 01/14/17 0237)  oxyCODONE-acetaminophen (PERCOCET/ROXICET) 5-325 MG per tablet 0.5 tablet (0.5 tablets Oral Given 01/14/17 0451)     Initial Impression / Assessment and Plan / ED Course  I have reviewed the triage vital signs and the nursing notes.  Pertinent labs & imaging results that were available during my care of the patient were reviewed by me and considered in my medical decision making (see chart for details).     Patient presented with isolated ankle injury after a fall. Patient slid out of her daughter's arms while trying to get  her from the bed to a wheelchair. Patient's upper trimalleolar fracture with dislocation of the right ankle. No other injuries were noted. Patient was sedated and reduction was successfully performed. We will have social work help with home health needs prior to discharge.  Final Clinical Impressions(s) / ED Diagnoses   Final diagnoses:  Closed trimalleolar fracture of right ankle, initial encounter    New Prescriptions New Prescriptions   No medications on file     Orpah Greek, MD 01/14/17 [phone]

## 2017-01-14 NOTE — Discharge Instructions (Addendum)
You were seen in the ED today with an ankle fracture. We were able to have you placed in a nursing facility to help you with you injury. Take the pain medication prescribed for pain.   You will need to keep the foley catheter in place for the next two weeks and then it can be removed for a void trial.   Your diet should be a mechanical soft diet.   Call the orthopedic surgeon listed to make an appointment for the ankle fracture. This may require surgery so call soon to make an appointment for the next week.   Return to the ED with any new or worsening symptoms.

## 2017-01-14 NOTE — Consult Note (Signed)
Consultation Note   MAECY PODGURSKI SWN:[id number] DOB: 1930/10/22 DOA: 01/13/2017   PCP: Josetta Huddle, MD   Attending physician: Marily Memos  Patient coming from/Resides with: private residence  Chief Complaint: ? Acute dysphagia  HPI: Julia Floyd is a 83 y.o. female with a past medical history of acute CVA with hemorrhagic conversion July 2018 requiring short stay in rehabilitation, hypertension, dyslipidemia, diabetes mellitus 2, an ulcer versus dementia. Patient presented to the ER after falling while being assisted out of bed noting her leg "gave way".She was noted to have deformity of the right ankle.in the ER she was found to have a dislocated right ankle on x-ray and she was given etomidate/conscious sedation for reduction and subsequent application of splinting device.post reduction x-rays revealed a near anatomic alignment. Patient scheduled to follow-up with orthopedic team after discharge.in the interim there were concerns that the patient needed skilled nursing facility placement due to acute injury as well as progressive decline since stroke. SW was consultedand a skilled nursing facility bed was obtained.in the interim it was noted that the patient had not vomited since arrival. A bladder scan revealed greater than 700 mL of fluid in the bladder. In and out catheterization was performed with a total of 1150 mL returned. Also while the granddaughter was attempting to feed the patient the patient was noted to have choking and small amount of coughing with administration of a bite of sandwich and some orange juice.because of choking concerns EDP asked for internal medicine consultation to ensure patient stable enough to discharge to SNF.  ED Course:  Vital Signs: BP (!) 142/56   Pulse 65   Temp 98 F (36.7 C) (Oral)   Resp (!) 23   SpO2 97%  CXR: Neg Right ankle x-ray: Complete posterior dislocation of the ankle Post reduction on ankle x-ray as noted above Lab data:  sodium 140, potassium 3.7, chloride 108, CO2 24, glucose 93, BUN 11, creatinine 0.96, white count 5100, hemoglobin 10.9, platelets 130,000 (chronic thrombocytopenia),urinalysis unremarkable, urine culture pending Medications and treatments: etomidate 10 mg IV 1, Percocet 5-325 1/2 tablet 1, Percocet 5-325 1/2 tablet 1, Percocet 5-325 tablet 1  Review of Systems:  In addition to the HPI above,  No Fever-chills, myalgias or other constitutional symptoms No Headache, changes with Vision or hearing, new weakness, tingling, numbness in any extremity, dizziness, dysarthria or word finding difficulty, tremors or seizure activity No problems swallowing food or Liquids, indigestion/reflux, no choking or coughing while eating until today in the ER, abdominal pain with or after eating No Chest pain, Cough or Shortness of Breath, palpitations, orthopnea or DOE No Abdominal pain, N/V, melena,hematochezia, dark tarry stools, constipation No dysuria, malodorous urine, hematuria or flank pain No new skin rashes, lesions, masses or bruises No recent unintentional weight gain or loss No polyuria, polydypsia or polyphagia   Past Medical History:  Diagnosis Date  . DDD (degenerative disc disease)   . Depression   . Diabetes ([place])   . HTN (hypertension)   . Psychotic disorder ([place])    secondary to the below factors. 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 Bipolar disorder, most likely type 3, this episode being manic  . Sixth cranial nerve palsy   . Stroke ([place])   . Trigeminal neuralgia   . Uterine fibroid     Past Surgical History:  Procedure Laterality Date  . BRONCHOSCOPY    . TRACHEOSTOMY      Social History   Social History  . Marital status:  Widowed    Spouse name: N/A  . Number of children: N/A  . Years of education: N/A   Occupational History  . Not on file.   Social History Main Topics  . Smoking status: Former Research scientist (life sciences)  . Smokeless tobacco: Never Used  .  Alcohol use No  . Drug use: No  . Sexual activity: Not on file   Other Topics Concern  . Not on file   Social History Narrative  . No narrative on file     No Known Allergies  Family History  Problem Relation Age of Onset  . Heart attack Unknown   . Diabetes Unknown      Prior to Admission medications   Medication Sig Start Date End Date Taking? Authorizing Provider  amLODipine (NORVASC) 10 MG tablet Take 1 tablet (10 mg total) by mouth daily. 10/29/16  Yes Angiulli, Lavon Paganini, PA-C  cyanocobalamin 500 MCG tablet Take 1 tablet (500 mcg total) by mouth daily. Vitamin B12 10/29/16  Yes Angiulli, Lavon Paganini, PA-C  furosemide (LASIX) 20 MG tablet Take 1 tablet (20 mg total) by mouth daily. 10/29/16  Yes Angiulli, Lavon Paganini, PA-C  gabapentin (NEURONTIN) 100 MG capsule Take 1-2 capsules (100-200 mg total) by mouth at bedtime. 10/29/16  Yes Angiulli, Lavon Paganini, PA-C  metFORMIN (GLUCOPHAGE) 500 MG tablet Take 1 tablet (500 mg total) by mouth 2 (two) times daily with a meal. 10/29/16  Yes Angiulli, Lavon Paganini, PA-C  pravastatin (PRAVACHOL) 40 MG tablet Take 1 tablet (40 mg total) by mouth daily at 6 PM. 10/29/16  Yes Angiulli, Lavon Paganini, PA-C  oxyCODONE-acetaminophen (PERCOCET) 5-325 MG tablet Take 0.5 tablets by mouth every 4 (four) hours as needed. 01/14/17   Long, Wonda Olds, MD  sulfamethoxazole-trimethoprim (BACTRIM,SEPTRA) 200-40 MG/5ML suspension Take 10 mLs by mouth 2 (two) times daily. 01/14/17 01/19/17  Margette Fast, MD    Physical Exam: Vitals:   01/14/17 1500 01/14/17 1515 01/14/17 1530 01/14/17 1545  BP: (!) 116/47 (!) 125/49 (!) 131/49 (!) 142/56  Pulse: 66 62 64 65  Resp: (!) 27 (!) 23 (!) 22 (!) 23  Temp:      TempSrc:      SpO2: 95% 96% 99% 97%      Constitutional: NAD, calm, comfortable Eyes: PERRL, lids and conjunctivae normal ENMT: Mucous membranes are moist. Posterior pharynx clear of any exudate or lesions.Normal dentition.  Neck: normal, supple, no masses, no  thyromegaly Respiratory: clear to auscultation bilaterally, no wheezing, no crackles. Normal respiratory effort. No accessory muscle use.  Cardiovascular: Regular rate and rhythm, no murmurs / rubs / gallops. No extremity edema. 2+ pedal pulses. No carotid bruits.  Abdomen: no tenderness, no masses palpated. No hepatosplenomegaly. Bowel sounds positive.  Musculoskeletal: no clubbing / cyanosis. No joint deformity upper and lower extremities.soft cast below the knee on right lower extremity. Good ROM, no contractures. Normal muscle tone.  Skin: no rashes, lesions, ulcers. No induration Neurologic: CN 2-12 grossly intact. Sensation intact, DTR normal. Strength 5/5 x all 4 extremities.  Psychiatric: Awake and oriented x 3. Flat mood.    Labs on Admission: I have personally reviewed following labs and imaging studies  CBC:  Recent Labs Lab 01/11/17 1908 01/14/17 0025  WBC 3.7* 5.1  NEUTROABS 2.1  --   HGB 10.9* 10.9*  HCT 35.4* 35.8*  MCV 91.9 92.3  PLT 121* 993*   Basic Metabolic Panel:  Recent Labs Lab 01/11/17 1908 01/14/17 0025  NA 137 140  K 3.7 3.7  CL 104 108  CO2 25 24  GLUCOSE 146* 93  BUN 11 11  CREATININE 0.86 0.96  CALCIUM 9.0 9.5   GFR: Estimated Creatinine Clearance: 45.4 mL/min (by C-G formula based on SCr of 0.96 mg/dL). Liver Function Tests:  Recent Labs Lab 01/11/17 1908  AST 16  ALT 8*  ALKPHOS 68  BILITOT 0.3  PROT 6.9  ALBUMIN 3.4*   No results for input(s): LIPASE, AMYLASE in the last 168 hours. No results for input(s): AMMONIA in the last 168 hours. Coagulation Profile: No results for input(s): INR, PROTIME in the last 168 hours. Cardiac Enzymes: No results for input(s): CKTOTAL, CKMB, CKMBINDEX, TROPONINI in the last 168 hours. BNP (last 3 results) No results for input(s): PROBNP in the last 8760 hours. HbA1C: No results for input(s): HGBA1C in the last 72 hours. CBG: No results for input(s): GLUCAP in the last 168 hours. Lipid  Profile: No results for input(s): CHOL, HDL, LDLCALC, TRIG, CHOLHDL, LDLDIRECT in the last 72 hours. Thyroid Function Tests: No results for input(s): TSH, T4TOTAL, FREET4, T3FREE, THYROIDAB in the last 72 hours. Anemia Panel: No results for input(s): VITAMINB12, FOLATE, FERRITIN, TIBC, IRON, RETICCTPCT in the last 72 hours. Urine analysis:    Component Value Date/Time   COLORURINE YELLOW 01/14/2017 1425   APPEARANCEUR CLEAR 01/14/2017 1425   LABSPEC 1.012 01/14/2017 1425   PHURINE 6.0 01/14/2017 1425   GLUCOSEU NEGATIVE 01/14/2017 1425   HGBUR NEGATIVE 01/14/2017 [place] 01/14/2017 [place] 01/14/2017 [place] 01/14/2017 1425   NITRITE NEGATIVE 01/14/2017 1425   LEUKOCYTESUR NEGATIVE 01/14/2017 1425   Sepsis Labs: @LABRCNTIP (procalcitonin:4,lacticidven:4) )No results found for this or any previous visit (from the past 240 hour(s)).   Radiological Exams on Admission: Dg Chest 1 View  Result Date: 01/14/2017 CLINICAL DATA:  Pain after a fall. EXAM: CHEST 1 VIEW COMPARISON:  08/18/2016 FINDINGS: Mild cardiac enlargement. No vascular congestion. No edema or consolidation. No blunting of costophrenic angles. No pneumothorax. IMPRESSION: Mild cardiac enlargement.  No evidence of active pulmonary disease. Electronically Signed   By: Lucienne Capers M.D.   On: 01/14/2017 00:59   Dg Pelvis 1-2 Views  Result Date: 01/14/2017 CLINICAL DATA:  Pain after a fall. EXAM: PELVIS - 1-2 VIEW COMPARISON:  None. FINDINGS: Diffuse bone demineralization. Pelvis and hips appear intact. No evidence of acute fracture or dislocation. No focal bone lesion or bone destruction. Degenerative changes in the lower lumbar spine. Multiple calcifications in the pelvis likely representing calcified fibroids. IMPRESSION: No acute bony abnormalities. Multiple large calcified fibroids in the pelvis. Electronically Signed   By: Lucienne Capers M.D.   On: 01/14/2017  00:58   Dg Ankle Complete Right  Result Date: 01/14/2017 CLINICAL DATA:  Pain after a fall. EXAM: RIGHT ANKLE - COMPLETE 3+ VIEW COMPARISON:  None. FINDINGS: There is complete posterior dislocation of the talus with respect to the tibia and fibula. There is a displaced posterior malleolar fracture. Likely avulsion fracture off of the anterior aspect of the distal tibia. The distal fibula is not well demonstrated in can't exclude a fracture here either. Small osseous fragments anterior to the cuboidal bone likely representing avulsion fragments. Degenerative changes in the intertarsal joints. Soft tissue swelling. Vascular calcifications. IMPRESSION: Complete posterior dislocation of the ankle. Displaced fracture fragment off of the posterior malleolus of the distal tibia. Probable avulsion fractures off of the anterior tibia and cuboidal bone. Electronically Signed   By: Oren Beckmann.D.  On: 01/14/2017 01:01   Dg Ankle Right Port  Result Date: 01/14/2017 CLINICAL DATA:  83 y/o  F; post reduction of right ankle. EXAM: PORTABLE RIGHT ANKLE - 2 VIEW COMPARISON:  01/14/2017 right ankle radiographs. FINDINGS: Interval reduction of trimalleolar ankle fracture with near anatomic alignment. Fine bony details are obscured by the overlying cast. Ankle mortise is symmetric on these nonstress views. IMPRESSION: Interval reduction of trimalleolar fracture with near anatomic alignment. Symmetric ankle mortise on these nonstress views. Fine bony details obscured by overlying cast. Electronically Signed   By: Kristine Garbe M.D.   On: 01/14/2017 03:21    Assessment/Plan Principal Problem:   ?? Dysphagia/History of CVA (cerebrovascular accident)/Alzheimer's dementia -presented to ER after falling at home sustaining dislocated right ankle. -Subsequently underwent closed reduction of dislocated ankleunder conscious sedation and has received a total of 10 mg of short actingoxycodone code and in divided  doses -According to granddaughter at bedside although patient has had poor oral intake over the past several weeks no one has reported and she has not witnessed any difficulty with coughing or choking while eating until today and the symptoms were not noted until after patient had received sedation and pain medication -patient did require short-term diet adjustments during the acute phase of her stroke in July but at time of discharge her diet was recommended as mechanical soft with thin liquids -continue mechanical soft diet with thin liquids and consider formal speech therapy evaluation to follow follow swallowing -Of note, patient was seen in the ER on 10/14 for reported aphasia. Urinalysis was negative and MRI brain showed no acute changes and she was deemed appropriate for discharge from the ER  Active Problems:   Acute urinary retention -granddaughter at bedside reports no issues of urinary retention at home or during previous hospitalization and patient denies dysuria, urinary frequency Or a subjective sensation of bladder distention -Discussed with my attending physician and plan is to insert Foley catheter for 2 weeks then begin a bladder training program -Recommend Bactrim twice a day until Foley discontinued -suspect combination of a time and date and narcotic pain medications contributing to urinary retention    Closed dislocation of right ankle -underwent closed reduction with physician cast placement in ER -Per EDP prior disposition recommendations patient is to follow-up with orthopedic -given recent issues with imbalance and acute urinary retention recommend using Tylenol and/or Ultram for pain    **from an internal medicine standpoint patient is appropriate to discharge to skilled nursing facility today. Formal discharge to be completed by EDP/Long    Kristal Perl L. ANP-BC [facility] Pager [phone]   If 7PM-7AM, please contact  night-coverage [email or web address] Password [email or web address]  01/14/2017, 3:57 PM

## 2017-01-14 NOTE — ED Notes (Signed)
Hospitalist at beside for evaluation.  Plan to send patient back to [facility].

## 2017-01-14 NOTE — Clinical Social Work Note (Signed)
Clinical Social Work Assessment  Patient Details  Name: Julia Floyd MRN: [id number] Date of Birth: Jan 12, 1931  Date of referral:  01/14/17               Reason for consult:  Facility Placement                Permission sought to share information with:  Family Supports Permission granted to share information::  Yes, Verbal Permission Granted  Name::     Musician  (pt's grandaughter)  Agency::     Relationship::     Contact Information:     Housing/Transportation Living arrangements for the past 2 months:  Single Family Home (with daughter) Source of Information:  Patient, Adult Children Patient Interpreter Needed:  None Criminal Activity/Legal Involvement Pertinent to Current Situation/Hospitalization:  No - Comment as needed Significant Relationships:  Adult Children, Other Family Members Lives with:  Adult Children Do you feel safe going back to the place where you live?    Need for family participation in patient care:  Yes (Comment)  Care giving concerns:  CSW spoke with pt and pt's daughter at bedside. During this time family expresses no concerns.    Social Worker assessment / plan:  CSW spoke with pt and family at bedside. During this time CSW was informed that pt is form home with daughter who has been care taker for many years. Per daughter pt keeps falling at home due to inability to be steady on pt's feet. Pt's daughter expressed that it is becoming harder to care for pt as pt's daughter has had a stroke and cant do for pt as pt needs. Pt's daughter expressed that they are interested in [employer]. CSW spoke with Julia Floyd and has begun the process of placement at this time.   Employment status:  Retired Nurse, adult PT Recommendations:  Not assessed at this time Information / Referral to community resources:  [place]  Patient/Family's Response to care: Pt's daughter and pt are understanding and agreeable to plan  of care at this time.   Patient/Family's Understanding of and Emotional Response to Diagnosis, Current Treatment, and Prognosis:  No further questions or concerns have been presented to CSW at this time.   Emotional Assessment Appearance:  Appears stated age Attitude/Demeanor/Rapport:    Affect (typically observed):  Pleasant Orientation:  Oriented to Self, Oriented to Place, Oriented to Situation Alcohol / Substance use:  Not Applicable Psych involvement (Current and /or in the community):  No (Comment)  Discharge Needs  Concerns to be addressed:  No discharge needs identified Readmission within the last 30 days:  No Current discharge risk:  None Barriers to Discharge:  No Barriers Identified   Julia Floyd, [place] 01/14/2017, 9:14 AM

## 2017-01-14 NOTE — ED Notes (Signed)
Patient had 1122ml of urine output.

## 2017-01-15 LAB — URINE CULTURE: Culture: NO GROWTH

## 2017-01-19 ENCOUNTER — Other Ambulatory Visit: Payer: Self-pay

## 2017-01-20 ENCOUNTER — Other Ambulatory Visit: Payer: Self-pay

## 2017-01-21 ENCOUNTER — Other Ambulatory Visit: Payer: Self-pay

## 2017-01-21 NOTE — Patient Outreach (Signed)
First/Second/Third attempt to obtain mRS. No answer. Left message for returned call. mRs = 7

## 2017-02-05 ENCOUNTER — Encounter (INDEPENDENT_AMBULATORY_CARE_PROVIDER_SITE_OTHER): Payer: Self-pay | Admitting: Orthopedic Surgery

## 2017-02-05 ENCOUNTER — Ambulatory Visit (INDEPENDENT_AMBULATORY_CARE_PROVIDER_SITE_OTHER): Payer: Medicare Other | Admitting: Orthopedic Surgery

## 2017-02-05 ENCOUNTER — Ambulatory Visit (INDEPENDENT_AMBULATORY_CARE_PROVIDER_SITE_OTHER): Payer: Medicare Other

## 2017-02-05 DIAGNOSIS — M25571 Pain in right ankle and joints of right foot: Secondary | ICD-10-CM | POA: Diagnosis not present

## 2017-02-05 DIAGNOSIS — S82851A Displaced trimalleolar fracture of right lower leg, initial encounter for closed fracture: Secondary | ICD-10-CM

## 2017-02-05 DIAGNOSIS — S9304XD Dislocation of right ankle joint, subsequent encounter: Secondary | ICD-10-CM | POA: Diagnosis not present

## 2017-02-05 NOTE — Progress Notes (Addendum)
Office Visit Note   Patient: Julia Floyd           Date of Birth: Dec 19, 1930           MRN: [id number] Visit Date: 02/05/2017              Requested by: Josetta Huddle, MD [address] [place], [place] [address] PCP: Josetta Huddle, MD  Chief Complaint  Patient presents with  . Right Ankle - Fracture      HPI: Patient is an 83 year old woman who is seen for initial evaluation for trimalleolar right ankle fracture which was initially reduced in the emergency room she was placed in a posterior and sugar tong splint was returned to skilled nursing and presents at this time for initial evaluation.    Assessment & Plan: Visit Diagnoses:  1. Pain in joint involving right ankle and foot   2. Closed trimalleolar fracture, right, initial encounter   3. Ankle dislocation, right, subsequent encounter     Plan: Patient has significant peripheral vascular disease and would be at increased risk with surgical intervention.  With a congruent mortise this should heal well with its current alignment.  We will place her in a fracture boot she may be weightbearing as tolerated for transfers follow-up in the office in 4 weeks.  3 view radiographs of the right ankle at follow-up.  This was discussed with her granddaughter present.  Follow-Up Instructions: Return in about 4 weeks (around 03/05/2017).   Ortho Exam  Patient is alert, oriented, no adenopathy, well-dressed, normal affect, normal respiratory effort. Patient has thin atrophic skin to the right lower extremity there is no open ulcers no swelling.  She does not have a palpable pulse.  Radiographs shows calcified vessels out to the toes.  Patient has pain-free passive range of motion of the ankle.  The mortise is congruent.  Imaging: Xr Ankle Complete Right  Result Date: 02/05/2017 Three-view radiographs of the right ankle shows a congruent mortise patient has an essentially nondisplaced trimalleolar right ankle  fracture with calcification of the arteries down to the toes.  No images are attached to the encounter.  Labs: Lab Results  Component Value Date   HGBA1C 5.1 10/08/2016   REPTSTATUS 01/15/2017 FINAL 01/14/2017   CULT NO GROWTH 01/14/2017    Orders:  Orders Placed This Encounter  Procedures  . XR Ankle Complete Right   No orders of the defined types were placed in this encounter.    Procedures: No procedures performed  Clinical Data: No additional findings.  ROS:  All other systems negative, except as noted in the HPI. Review of Systems  Objective: Vital Signs: There were no vitals taken for this visit.  Specialty Comments:  No specialty comments available.  PMFS History: Patient Active Problem List   Diagnosis Date Noted  . Closed trimalleolar fracture, right, initial encounter 02/05/2017  . Closed dislocation of right ankle 01/14/2017  . Dysphagia 01/14/2017  . History of CVA (cerebrovascular accident) 01/14/2017  . Acute urinary retention 01/14/2017  . Alzheimer's dementia 01/14/2017  . Benign essential HTN   . Neuropathic pain   . Urinary incontinence   . Constipation   . Diabetes mellitus type 2 in nonobese (HCC)   . Acute blood loss anemia   . Gait disturbance, post-stroke   . Intraparenchymal hemorrhage of brain ([place]) 10/14/2016  . Received tissue plasminogen activator (t-PA) less than 24 hours prior to arrival 10/09/2016  . ICH (intracerebral hemorrhage) ([place]) 10/09/2016  .  Cytotoxic brain edema ([place]) 10/09/2016  . Stroke (cerebrum) ([place]) -  Suspect stroke/TIA s/p tPA with multifocal ICH - etiology unclear, concerning for underlying CAA 10/07/2016  . HTN (hypertension)   . Diabetes ([place])   . Depression   . DDD (degenerative disc disease)   . Uterine fibroid   . Trigeminal neuralgia   . Sixth cranial nerve palsy    Past Medical History:  Diagnosis Date  . DDD (degenerative disc disease)   . Depression   . Diabetes ([place])   . HTN  (hypertension)   . Psychotic disorder ([place])    secondary to the below factors. 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 Bipolar disorder, most likely type 3, this episode being manic  . Sixth cranial nerve palsy   . Stroke ([place])   . Trigeminal neuralgia   . Uterine fibroid     Family History  Problem Relation Age of Onset  . Heart attack Unknown   . Diabetes Unknown     Past Surgical History:  Procedure Laterality Date  . BRONCHOSCOPY    . TRACHEOSTOMY     Social History   Occupational History  . Not on file  Tobacco Use  . Smoking status: Former Research scientist (life sciences)  . Smokeless tobacco: Never Used  Substance and Sexual Activity  . Alcohol use: No  . Drug use: No  . Sexual activity: Not on file

## 2017-02-16 ENCOUNTER — Encounter (HOSPITAL_COMMUNITY): Payer: Self-pay

## 2017-02-16 ENCOUNTER — Other Ambulatory Visit: Payer: Self-pay

## 2017-02-16 ENCOUNTER — Emergency Department (HOSPITAL_COMMUNITY)
Admission: EM | Admit: 2017-02-16 | Discharge: 2017-02-16 | Disposition: A | Discharge status: Home / Self Care | Payer: Medicare Other | Attending: Emergency Medicine | Admitting: Emergency Medicine

## 2017-02-16 DIAGNOSIS — Z87891 Personal history of nicotine dependence: Secondary | ICD-10-CM | POA: Diagnosis not present

## 2017-02-16 DIAGNOSIS — Z79899 Other long term (current) drug therapy: Secondary | ICD-10-CM | POA: Diagnosis not present

## 2017-02-16 DIAGNOSIS — L819 Disorder of pigmentation, unspecified: Secondary | ICD-10-CM | POA: Diagnosis present

## 2017-02-16 DIAGNOSIS — Z7984 Long term (current) use of oral hypoglycemic drugs: Secondary | ICD-10-CM | POA: Insufficient documentation

## 2017-02-16 DIAGNOSIS — L89329 Pressure ulcer of left buttock, unspecified stage: Secondary | ICD-10-CM | POA: Diagnosis not present

## 2017-02-16 DIAGNOSIS — E119 Type 2 diabetes mellitus without complications: Secondary | ICD-10-CM | POA: Diagnosis not present

## 2017-02-16 DIAGNOSIS — Z8673 Personal history of transient ischemic attack (TIA), and cerebral infarction without residual deficits: Secondary | ICD-10-CM | POA: Diagnosis not present

## 2017-02-16 DIAGNOSIS — I739 Peripheral vascular disease, unspecified: Secondary | ICD-10-CM

## 2017-02-16 DIAGNOSIS — I7090 Unspecified atherosclerosis: Secondary | ICD-10-CM | POA: Diagnosis not present

## 2017-02-16 DIAGNOSIS — I10 Essential (primary) hypertension: Secondary | ICD-10-CM | POA: Insufficient documentation

## 2017-02-16 MED ORDER — CYCLOBENZAPRINE HCL 5 MG PO TABS: 5.0000 mg | ORAL_TABLET | Freq: Three times a day (TID) | ORAL | 0 refills | Status: DC | PRN

## 2017-02-16 NOTE — ED Triage Notes (Signed)
PT arrived from home via EMS after new home health aid saw that her left leg was "discolored". Skin is flaky with skin tear to inner right ankle. EMS also noted pressure ulcer on bottom. Pt broke right ankle in July and was at [facility] and has been home for 2 week with health aid M-F.

## 2017-02-16 NOTE — ED Provider Notes (Addendum)
[facility] EMERGENCY DEPARTMENT Provider Note   CSN: [id number] Arrival date & time: 02/16/17  1632     History   Chief Complaint Chief Complaint  Patient presents with  . Foot Pain    HPI Julia Floyd is a 83 y.o. female.  HPI Patient sent in by new home health aide for discoloration of the right foot.  Patient states the foot has been jumping for a while.  Some mild pain with that particular with palpation.  Had a trimalleolar fracture drain has some anterior drainage.  That was repaired a month ago by Dr. Sharol Given.  Reviewed records and she did not have a palpable pulse in the foot at that time.  No fevers.  Also has a wound on her rear end. Past Medical History:  Diagnosis Date  . DDD (degenerative disc disease)   . Depression   . Diabetes ([place])   . HTN (hypertension)   . Psychotic disorder ([place])    secondary to the below factors. 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 Bipolar disorder, most likely type 3, this episode being manic  . Sixth cranial nerve palsy   . Stroke ([place])   . Trigeminal neuralgia   . Uterine fibroid     Patient Active Problem List   Diagnosis Date Noted  . Closed trimalleolar fracture, right, initial encounter 02/05/2017  . Closed dislocation of right ankle 01/14/2017  . Dysphagia 01/14/2017  . History of CVA (cerebrovascular accident) 01/14/2017  . Acute urinary retention 01/14/2017  . Alzheimer's dementia 01/14/2017  . Benign essential HTN   . Neuropathic pain   . Urinary incontinence   . Constipation   . Diabetes mellitus type 2 in nonobese (HCC)   . Acute blood loss anemia   . Gait disturbance, post-stroke   . Intraparenchymal hemorrhage of brain ([place]) 10/14/2016  . Received tissue plasminogen activator (t-PA) less than 24 hours prior to arrival 10/09/2016  . ICH (intracerebral hemorrhage) ([place]) 10/09/2016  . Cytotoxic brain edema ([place]) 10/09/2016  . Stroke (cerebrum) ([place]) -  Suspect  stroke/TIA s/p tPA with multifocal ICH - etiology unclear, concerning for underlying CAA 10/07/2016  . HTN (hypertension)   . Diabetes ([place])   . Depression   . DDD (degenerative disc disease)   . Uterine fibroid   . Trigeminal neuralgia   . Sixth cranial nerve palsy     Past Surgical History:  Procedure Laterality Date  . BRONCHOSCOPY    . TRACHEOSTOMY      OB History    No data available       Home Medications    Prior to Admission medications   Medication Sig Start Date End Date Taking? Authorizing Provider  amLODipine (NORVASC) 10 MG tablet Take 1 tablet (10 mg total) by mouth daily. 10/29/16  Yes Angiulli, Lavon Paganini, PA-C  cyanocobalamin 500 MCG tablet Take 1 tablet (500 mcg total) by mouth daily. Vitamin B12 10/29/16  Yes Angiulli, Lavon Paganini, PA-C  furosemide (LASIX) 20 MG tablet Take 1 tablet (20 mg total) by mouth daily. 10/29/16  Yes Angiulli, Lavon Paganini, PA-C  gabapentin (NEURONTIN) 100 MG capsule Take 1-2 capsules (100-200 mg total) by mouth at bedtime. 10/29/16  Yes Angiulli, Lavon Paganini, PA-C  metFORMIN (GLUCOPHAGE) 500 MG tablet Take 1 tablet (500 mg total) by mouth 2 (two) times daily with a meal. 10/29/16  Yes Angiulli, Lavon Paganini, PA-C  Omega-3 Fatty Acids (FISH OIL PO) Take 1 capsule daily by mouth.   Yes [provider]  VITAMIN D, CHOLECALCIFEROL, PO Take 1 capsule daily by mouth.   Yes [provider]  oxyCODONE-acetaminophen (PERCOCET) 5-325 MG tablet Take 0.5 tablets by mouth every 4 (four) hours as needed. Patient not taking: Reported on 02/16/2017 01/14/17   Long, Wonda Olds, MD  pravastatin (PRAVACHOL) 40 MG tablet Take 1 tablet (40 mg total) by mouth daily at 6 PM. Patient not taking: Reported on 02/16/2017 10/29/16   Angiulli, Lavon Paganini, PA-C    Family History Family History  Problem Relation Age of Onset  . Heart attack Unknown   . Diabetes Unknown     Social History Social History   Tobacco Use  . Smoking status: Former Research scientist (life sciences)  . Smokeless  tobacco: Never Used  Substance Use Topics  . Alcohol use: No  . Drug use: No     Allergies   Patient has no known allergies.   Review of Systems Review of Systems  Constitutional: Negative for appetite change and fever.  HENT: Negative for congestion.   Respiratory: Negative for shortness of breath.   Gastrointestinal: Negative for abdominal pain.  Genitourinary: Negative for flank pain.  Musculoskeletal:       Right foot pain  Skin: Positive for wound.  Neurological: Negative for weakness.  Hematological: Negative for adenopathy.  Psychiatric/Behavioral: Negative for confusion.     Physical Exam Updated Vital Signs BP (!) 152/57   Pulse 78   Temp 98 F (36.7 C) (Oral)   Resp 16   Ht 5\' 6"  (1.676 m)   Wt 73 kg (161 lb)   SpO2 100%   BMI 25.99 kg/m   Physical Exam  Constitutional: She appears well-developed.  HENT:  Head: Atraumatic.  Neck: Neck supple.  Cardiovascular: Normal rate.  Pulmonary/Chest: Effort normal.  Abdominal: There is no tenderness.  Musculoskeletal:  Atrophic discolored skin on dorsum of right foot.  Does have capillary refill and temperature of both feet are the same.  However does not have a palpable pulse on the right foot.  There is a small superficial ulcer on the superior medial aspect of the foot.  No fluctuance.  No erythema.  No drainage.  There is also superficial decubitus ulcer on the sacral area.  Neurological: She is alert.  Skin: Skin is warm. Capillary refill takes less than 2 seconds.     ED Treatments / Results  Labs (all labs ordered are listed, but only abnormal results are displayed) Labs Reviewed - No data to display  EKG  EKG Interpretation None       Radiology No results found.  Procedures Procedures (including critical care time)  Medications Ordered in ED Medications - No data to display   Initial Impression / Assessment and Plan / ED Course  I have reviewed the triage vital signs and the nursing  notes.  Pertinent labs & imaging results that were available during my care of the patient were reviewed by me and considered in my medical decision making (see chart for details).     Patient with foot pain.  Does not have palpable pulse but does have Doppler pulse.  Reviewing the orthopedic note on most recent office visit she also did not have a pulse.  I think there is some arterial insufficiency that is chronic.  Patient would potentially benefit from vascular surgery follow-up.  Does not appear to be infected at this time.  Will discharge home. Family has requested muscle relaxers for the spasm and will be given to her.  Final  Clinical Impressions(s) / ED Diagnoses   Final diagnoses:  Arterial insufficiency of lower extremity ([place])  Pressure injury of skin of left buttock, unspecified injury stage    ED Discharge Orders    None       Davonna Belling, MD 02/16/17 [place], Astor Gentle, MD 02/16/17 [phone]

## 2017-03-06 ENCOUNTER — Encounter (HOSPITAL_BASED_OUTPATIENT_CLINIC_OR_DEPARTMENT_OTHER): Payer: Medicare Other | Attending: Internal Medicine

## 2017-03-06 ENCOUNTER — Other Ambulatory Visit: Payer: Self-pay

## 2017-03-06 DIAGNOSIS — Z7984 Long term (current) use of oral hypoglycemic drugs: Secondary | ICD-10-CM | POA: Insufficient documentation

## 2017-03-06 DIAGNOSIS — L89320 Pressure ulcer of left buttock, unstageable: Secondary | ICD-10-CM | POA: Insufficient documentation

## 2017-03-06 DIAGNOSIS — E1151 Type 2 diabetes mellitus with diabetic peripheral angiopathy without gangrene: Secondary | ICD-10-CM | POA: Diagnosis not present

## 2017-03-06 DIAGNOSIS — R269 Unspecified abnormalities of gait and mobility: Secondary | ICD-10-CM | POA: Insufficient documentation

## 2017-03-06 DIAGNOSIS — G309 Alzheimer's disease, unspecified: Secondary | ICD-10-CM | POA: Insufficient documentation

## 2017-03-06 DIAGNOSIS — F0280 Dementia in other diseases classified elsewhere without behavioral disturbance: Secondary | ICD-10-CM | POA: Diagnosis not present

## 2017-03-06 DIAGNOSIS — L89510 Pressure ulcer of right ankle, unstageable: Secondary | ICD-10-CM | POA: Diagnosis not present

## 2017-03-06 DIAGNOSIS — I69398 Other sequelae of cerebral infarction: Secondary | ICD-10-CM | POA: Insufficient documentation

## 2017-03-06 DIAGNOSIS — I10 Essential (primary) hypertension: Secondary | ICD-10-CM | POA: Diagnosis not present

## 2017-03-06 DIAGNOSIS — L89890 Pressure ulcer of other site, unstageable: Secondary | ICD-10-CM | POA: Insufficient documentation

## 2017-03-06 DIAGNOSIS — E1140 Type 2 diabetes mellitus with diabetic neuropathy, unspecified: Secondary | ICD-10-CM | POA: Insufficient documentation

## 2017-03-06 DIAGNOSIS — I739 Peripheral vascular disease, unspecified: Secondary | ICD-10-CM

## 2017-03-06 DIAGNOSIS — L89613 Pressure ulcer of right heel, stage 3: Secondary | ICD-10-CM | POA: Insufficient documentation

## 2017-03-09 ENCOUNTER — Ambulatory Visit (INDEPENDENT_AMBULATORY_CARE_PROVIDER_SITE_OTHER): Payer: Medicare Other | Admitting: Orthopedic Surgery

## 2017-03-10 ENCOUNTER — Encounter (HOSPITAL_COMMUNITY): Payer: Medicare Other

## 2017-03-13 ENCOUNTER — Encounter (HOSPITAL_COMMUNITY): Payer: Self-pay

## 2017-03-13 ENCOUNTER — Inpatient Hospital Stay (HOSPITAL_COMMUNITY)
Admission: EM | Admit: 2017-03-13 | Discharge: 2017-03-30 | DRG: 981 | Disposition: A | Discharge status: Skilled Nursing Facility | Payer: Medicare Other | Attending: Family Medicine | Admitting: Family Medicine

## 2017-03-13 ENCOUNTER — Emergency Department (HOSPITAL_COMMUNITY): Payer: Medicare Other

## 2017-03-13 ENCOUNTER — Other Ambulatory Visit: Payer: Self-pay

## 2017-03-13 DIAGNOSIS — L89610 Pressure ulcer of right heel, unstageable: Secondary | ICD-10-CM | POA: Diagnosis present

## 2017-03-13 DIAGNOSIS — F319 Bipolar disorder, unspecified: Secondary | ICD-10-CM | POA: Diagnosis present

## 2017-03-13 DIAGNOSIS — I998 Other disorder of circulatory system: Secondary | ICD-10-CM | POA: Diagnosis present

## 2017-03-13 DIAGNOSIS — F0280 Dementia in other diseases classified elsewhere without behavioral disturbance: Secondary | ICD-10-CM | POA: Diagnosis present

## 2017-03-13 DIAGNOSIS — D696 Thrombocytopenia, unspecified: Secondary | ICD-10-CM | POA: Diagnosis not present

## 2017-03-13 DIAGNOSIS — N179 Acute kidney failure, unspecified: Secondary | ICD-10-CM | POA: Diagnosis present

## 2017-03-13 DIAGNOSIS — I700 Atherosclerosis of aorta: Secondary | ICD-10-CM | POA: Diagnosis present

## 2017-03-13 DIAGNOSIS — I771 Stricture of artery: Secondary | ICD-10-CM | POA: Diagnosis present

## 2017-03-13 DIAGNOSIS — D649 Anemia, unspecified: Secondary | ICD-10-CM | POA: Diagnosis present

## 2017-03-13 DIAGNOSIS — Z87891 Personal history of nicotine dependence: Secondary | ICD-10-CM | POA: Diagnosis not present

## 2017-03-13 DIAGNOSIS — E1169 Type 2 diabetes mellitus with other specified complication: Secondary | ICD-10-CM | POA: Diagnosis present

## 2017-03-13 DIAGNOSIS — M726 Necrotizing fasciitis: Secondary | ICD-10-CM | POA: Diagnosis present

## 2017-03-13 DIAGNOSIS — L089 Local infection of the skin and subcutaneous tissue, unspecified: Secondary | ICD-10-CM | POA: Diagnosis not present

## 2017-03-13 DIAGNOSIS — N1330 Unspecified hydronephrosis: Secondary | ICD-10-CM | POA: Diagnosis present

## 2017-03-13 DIAGNOSIS — Z7401 Bed confinement status: Secondary | ICD-10-CM | POA: Diagnosis not present

## 2017-03-13 DIAGNOSIS — L98413 Non-pressure chronic ulcer of buttock with necrosis of muscle: Secondary | ICD-10-CM | POA: Diagnosis not present

## 2017-03-13 DIAGNOSIS — R32 Unspecified urinary incontinence: Secondary | ICD-10-CM | POA: Diagnosis not present

## 2017-03-13 DIAGNOSIS — I739 Peripheral vascular disease, unspecified: Secondary | ICD-10-CM

## 2017-03-13 DIAGNOSIS — Z993 Dependence on wheelchair: Secondary | ICD-10-CM

## 2017-03-13 DIAGNOSIS — E08621 Diabetes mellitus due to underlying condition with foot ulcer: Secondary | ICD-10-CM

## 2017-03-13 DIAGNOSIS — F0390 Unspecified dementia without behavioral disturbance: Secondary | ICD-10-CM | POA: Diagnosis not present

## 2017-03-13 DIAGNOSIS — E876 Hypokalemia: Secondary | ICD-10-CM | POA: Diagnosis not present

## 2017-03-13 DIAGNOSIS — E119 Type 2 diabetes mellitus without complications: Secondary | ICD-10-CM | POA: Diagnosis not present

## 2017-03-13 DIAGNOSIS — L97413 Non-pressure chronic ulcer of right heel and midfoot with necrosis of muscle: Secondary | ICD-10-CM | POA: Diagnosis not present

## 2017-03-13 DIAGNOSIS — I69154 Hemiplegia and hemiparesis following nontraumatic intracerebral hemorrhage affecting left non-dominant side: Secondary | ICD-10-CM | POA: Diagnosis not present

## 2017-03-13 DIAGNOSIS — L909 Atrophic disorder of skin, unspecified: Secondary | ICD-10-CM | POA: Diagnosis present

## 2017-03-13 DIAGNOSIS — M8580 Other specified disorders of bone density and structure, unspecified site: Secondary | ICD-10-CM | POA: Diagnosis present

## 2017-03-13 DIAGNOSIS — T148XXA Other injury of unspecified body region, initial encounter: Secondary | ICD-10-CM

## 2017-03-13 DIAGNOSIS — G479 Sleep disorder, unspecified: Secondary | ICD-10-CM | POA: Diagnosis present

## 2017-03-13 DIAGNOSIS — Z79899 Other long term (current) drug therapy: Secondary | ICD-10-CM

## 2017-03-13 DIAGNOSIS — Z7189 Other specified counseling: Secondary | ICD-10-CM

## 2017-03-13 DIAGNOSIS — Z7984 Long term (current) use of oral hypoglycemic drugs: Secondary | ICD-10-CM

## 2017-03-13 DIAGNOSIS — E11621 Type 2 diabetes mellitus with foot ulcer: Secondary | ICD-10-CM | POA: Diagnosis present

## 2017-03-13 DIAGNOSIS — D638 Anemia in other chronic diseases classified elsewhere: Secondary | ICD-10-CM | POA: Diagnosis present

## 2017-03-13 DIAGNOSIS — Z8673 Personal history of transient ischemic attack (TIA), and cerebral infarction without residual deficits: Secondary | ICD-10-CM | POA: Diagnosis not present

## 2017-03-13 DIAGNOSIS — Z515 Encounter for palliative care: Secondary | ICD-10-CM | POA: Diagnosis not present

## 2017-03-13 DIAGNOSIS — E785 Hyperlipidemia, unspecified: Secondary | ICD-10-CM | POA: Diagnosis present

## 2017-03-13 DIAGNOSIS — E1151 Type 2 diabetes mellitus with diabetic peripheral angiopathy without gangrene: Secondary | ICD-10-CM | POA: Diagnosis present

## 2017-03-13 DIAGNOSIS — L97429 Non-pressure chronic ulcer of left heel and midfoot with unspecified severity: Secondary | ICD-10-CM | POA: Diagnosis present

## 2017-03-13 DIAGNOSIS — Z5181 Encounter for therapeutic drug level monitoring: Secondary | ICD-10-CM | POA: Diagnosis not present

## 2017-03-13 DIAGNOSIS — E11622 Type 2 diabetes mellitus with other skin ulcer: Secondary | ICD-10-CM | POA: Diagnosis not present

## 2017-03-13 DIAGNOSIS — D72819 Decreased white blood cell count, unspecified: Secondary | ICD-10-CM | POA: Diagnosis not present

## 2017-03-13 DIAGNOSIS — G309 Alzheimer's disease, unspecified: Secondary | ICD-10-CM | POA: Diagnosis present

## 2017-03-13 DIAGNOSIS — R159 Full incontinence of feces: Secondary | ICD-10-CM | POA: Diagnosis present

## 2017-03-13 DIAGNOSIS — S91301A Unspecified open wound, right foot, initial encounter: Secondary | ICD-10-CM | POA: Diagnosis not present

## 2017-03-13 DIAGNOSIS — L98423 Non-pressure chronic ulcer of back with necrosis of muscle: Secondary | ICD-10-CM | POA: Diagnosis not present

## 2017-03-13 DIAGNOSIS — I10 Essential (primary) hypertension: Secondary | ICD-10-CM | POA: Diagnosis present

## 2017-03-13 DIAGNOSIS — R509 Fever, unspecified: Secondary | ICD-10-CM | POA: Diagnosis not present

## 2017-03-13 DIAGNOSIS — D259 Leiomyoma of uterus, unspecified: Secondary | ICD-10-CM | POA: Diagnosis present

## 2017-03-13 DIAGNOSIS — M868X8 Other osteomyelitis, other site: Secondary | ICD-10-CM | POA: Diagnosis not present

## 2017-03-13 DIAGNOSIS — L89890 Pressure ulcer of other site, unstageable: Secondary | ICD-10-CM | POA: Diagnosis present

## 2017-03-13 DIAGNOSIS — L89159 Pressure ulcer of sacral region, unspecified stage: Secondary | ICD-10-CM | POA: Diagnosis present

## 2017-03-13 DIAGNOSIS — M4628 Osteomyelitis of vertebra, sacral and sacrococcygeal region: Secondary | ICD-10-CM | POA: Diagnosis present

## 2017-03-13 DIAGNOSIS — L89154 Pressure ulcer of sacral region, stage 4: Secondary | ICD-10-CM | POA: Diagnosis present

## 2017-03-13 DIAGNOSIS — I639 Cerebral infarction, unspecified: Secondary | ICD-10-CM | POA: Diagnosis present

## 2017-03-13 DIAGNOSIS — L98499 Non-pressure chronic ulcer of skin of other sites with unspecified severity: Secondary | ICD-10-CM | POA: Diagnosis not present

## 2017-03-13 DIAGNOSIS — Z79891 Long term (current) use of opiate analgesic: Secondary | ICD-10-CM

## 2017-03-13 DIAGNOSIS — I6300 Cerebral infarction due to thrombosis of unspecified precerebral artery: Secondary | ICD-10-CM | POA: Diagnosis not present

## 2017-03-13 DIAGNOSIS — M62838 Other muscle spasm: Secondary | ICD-10-CM | POA: Diagnosis present

## 2017-03-13 DIAGNOSIS — R197 Diarrhea, unspecified: Secondary | ICD-10-CM | POA: Diagnosis not present

## 2017-03-13 LAB — COMPREHENSIVE METABOLIC PANEL
ALK PHOS: 53 U/L (ref 38–126)
ALT: 11 U/L — AB (ref 14–54)
AST: 36 U/L (ref 15–41)
Albumin: 2.3 g/dL — ABNORMAL LOW (ref 3.5–5.0)
Anion gap: 7 (ref 5–15)
BUN: 27 mg/dL — AB (ref 6–20)
CALCIUM: 8.5 mg/dL — AB (ref 8.9–10.3)
CHLORIDE: 105 mmol/L (ref 101–111)
CO2: 24 mmol/L (ref 22–32)
CREATININE: 1.03 mg/dL — AB (ref 0.44–1.00)
GFR, EST AFRICAN AMERICAN: 56 mL/min — AB (ref >60)
GFR, EST NON AFRICAN AMERICAN: 49 mL/min — AB (ref >60)
Glucose, Bld: 144 mg/dL — ABNORMAL HIGH (ref 65–99)
Potassium: 5.4 mmol/L — ABNORMAL HIGH (ref 3.5–5.1)
Sodium: 136 mmol/L (ref 135–145)
Total Bilirubin: 1.3 mg/dL — ABNORMAL HIGH (ref 0.3–1.2)
Total Protein: 7 g/dL (ref 6.5–8.1)

## 2017-03-13 LAB — CBC WITH DIFFERENTIAL/PLATELET
BASOS PCT: 0 %
Basophils Absolute: 0 10*3/uL (ref 0.0–0.1)
EOS ABS: 0 10*3/uL (ref 0.0–0.7)
EOS PCT: 0 %
HCT: 24 % — ABNORMAL LOW (ref 36.0–46.0)
HEMOGLOBIN: 7.6 g/dL — AB (ref 12.0–15.0)
LYMPHS ABS: 1.4 10*3/uL (ref 0.7–4.0)
Lymphocytes Relative: 10 %
MCH: 27.1 pg (ref 26.0–34.0)
MCHC: 31.7 g/dL (ref 30.0–36.0)
MCV: 85.7 fL (ref 78.0–100.0)
MONOS PCT: 11 %
Monocytes Absolute: 1.4 10*3/uL — ABNORMAL HIGH (ref 0.1–1.0)
NEUTROS PCT: 79 %
Neutro Abs: 10.8 10*3/uL — ABNORMAL HIGH (ref 1.7–7.7)
PLATELETS: 262 10*3/uL (ref 150–400)
RBC: 2.8 MIL/uL — ABNORMAL LOW (ref 3.87–5.11)
RDW: 15.8 % — AB (ref 11.5–15.5)
WBC: 13.6 10*3/uL — ABNORMAL HIGH (ref 4.0–10.5)

## 2017-03-13 LAB — I-STAT CG4 LACTIC ACID, ED: Lactic Acid, Venous: 1.47 mmol/L (ref 0.5–1.9)

## 2017-03-13 MED ORDER — VANCOMYCIN HCL 10 G IV SOLR
1500.0000 mg | Freq: Once | INTRAVENOUS | Status: AC
  Administered 2017-03-14: 1500 mg via INTRAVENOUS
  Filled 2017-03-13: qty 1500

## 2017-03-13 MED ORDER — SODIUM CHLORIDE 0.9 % IV SOLN
Freq: Once | INTRAVENOUS | Status: AC
  Administered 2017-03-13: 100 mL/h via INTRAVENOUS

## 2017-03-13 MED ORDER — CLINDAMYCIN PHOSPHATE 600 MG/50ML IV SOLN
600.0000 mg | Freq: Once | INTRAVENOUS | Status: AC
  Administered 2017-03-14: 600 mg via INTRAVENOUS
  Filled 2017-03-13: qty 50

## 2017-03-13 MED ORDER — PIPERACILLIN-TAZOBACTAM 3.375 G IVPB 30 MIN
3.3750 g | Freq: Once | INTRAVENOUS | Status: AC
  Administered 2017-03-14: 3.375 g via INTRAVENOUS
  Filled 2017-03-13: qty 50

## 2017-03-13 NOTE — ED Notes (Addendum)
Patient came due to unstageable ulcer on sacrum. Patient also has a DTI on right heal and on inner side of foot patient has an unstageable ulcer. Patient is not complaining of pain right now but is having pain in her right leg from time to time. Patient has not had a fever or any other symptoms that family has noticed. Wound doctor sent patient to ED because he thinks the wounds are infected.

## 2017-03-13 NOTE — Progress Notes (Signed)
Patient ID: Julia Floyd, female   DOB: 04/17/1930, 83 y.o.   MRN: [id number]  [facility]  Case discussed with ER staff by telephone.  CT scan reviewed and clinical data noted.  Agree with plans for admission to medical service for resuscitation, IV abx's, and possible transfusion for anemia.  CT demonstrates air in tissues beneath the skin which appears to communicate with the decubitus ulceration.  Lactate level not elevated.  Hemodynamically stable.  Given that the wound undermines the skin in several directions, may require operative debridement in order to achieve adequate wound care.  Evidence of osteomyelitis on CT noted.  Full consult to follow.  Will follow with you.  Armandina Gemma, [facility] Office: [phone]

## 2017-03-13 NOTE — ED Provider Notes (Signed)
[facility] DEPT Provider Note  CSN: [id number] Arrival date & time: 03/13/17 1224  Chief Complaint(s) Wound Check (right foot)  HPI Julia Floyd is a 83 y.o. female with an extensive past medical history listed below including CVA in July requiring skilled nursing facility stay.  Patient developed sacral wound that has been managed by wound care.  Over the last several weeks she also developed right heel wound that wound care was managing.  She went for follow-up today and was instructed to present to the emergency department for admission and evaluation for likely debridement given the fact that her wounds have been worsening.  Family reports that the patient has not had any fevers.  No  vomiting or diarrhea.  No coughing.  The family does report that the  patient spends most of the day sitting in a wheelchair without being shifted.  They report that her daughter suffered a stroke and has been unable to care for the patient.  Other than sacral discomfort, the patient has no complaints.  HPI  Past Medical History Past Medical History:  Diagnosis Date  . DDD (degenerative disc disease)   . Depression   . Diabetes ([place])   . HTN (hypertension)   . Psychotic disorder ([place])    secondary to the below factors. 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 Bipolar disorder, most likely type 3, this episode being manic  . Sixth cranial nerve palsy   . Stroke ([place])   . Trigeminal neuralgia   . Uterine fibroid    Patient Active Problem List   Diagnosis Date Noted  . Wound infection 03/13/2017  . Closed trimalleolar fracture, right, initial encounter 02/05/2017  . Closed dislocation of right ankle 01/14/2017  . Dysphagia 01/14/2017  . History of CVA (cerebrovascular accident) 01/14/2017  . Acute urinary retention 01/14/2017  . Alzheimer's dementia 01/14/2017  . Benign essential HTN   . Neuropathic pain   . Urinary incontinence   .  Constipation   . Diabetes mellitus type 2 in nonobese (HCC)   . Acute blood loss anemia   . Gait disturbance, post-stroke   . Intraparenchymal hemorrhage of brain ([place]) 10/14/2016  . Received tissue plasminogen activator (t-PA) less than 24 hours prior to arrival 10/09/2016  . ICH (intracerebral hemorrhage) ([place]) 10/09/2016  . Cytotoxic brain edema ([place]) 10/09/2016  . Stroke (cerebrum) ([place]) -  Suspect stroke/TIA s/p tPA with multifocal ICH - etiology unclear, concerning for underlying CAA 10/07/2016  . HTN (hypertension)   . Diabetes ([place])   . Depression   . DDD (degenerative disc disease)   . Uterine fibroid   . Trigeminal neuralgia   . Sixth cranial nerve palsy    Home Medication(s) Prior to Admission medications   Medication Sig Start Date End Date Taking? Authorizing Provider  amLODipine (NORVASC) 10 MG tablet Take 1 tablet (10 mg total) by mouth daily. 10/29/16  Yes Angiulli, Lavon Paganini, PA-C  cyanocobalamin 500 MCG tablet Take 1 tablet (500 mcg total) by mouth daily. Vitamin B12 10/29/16  Yes Angiulli, Lavon Paganini, PA-C  cyclobenzaprine (FLEXERIL) 5 MG tablet Take 1 tablet (5 mg total) 3 (three) times daily as needed by mouth for muscle spasms. 02/16/17  Yes Davonna Belling, MD  furosemide (LASIX) 20 MG tablet Take 1 tablet (20 mg total) by mouth daily. 10/29/16  Yes Angiulli, Lavon Paganini, PA-C  gabapentin (NEURONTIN) 100 MG capsule Take 1-2 capsules (100-200 mg total) by mouth at bedtime. 10/29/16  Yes Angiulli, Lavon Paganini, PA-C  metFORMIN (GLUCOPHAGE) 500 MG tablet Take 1 tablet (500 mg total) by mouth 2 (two) times daily with a meal. 10/29/16  Yes Angiulli, Lavon Paganini, PA-C  Omega-3 Fatty Acids (FISH OIL PO) Take 1 capsule daily by mouth.   Yes [provider]  VITAMIN D, CHOLECALCIFEROL, PO Take 1 capsule daily by mouth.   Yes [provider]  oxyCODONE-acetaminophen (PERCOCET) 5-325 MG tablet Take 0.5 tablets by mouth every 4 (four) hours as needed. Patient not taking: Reported  on 02/16/2017 01/14/17   Long, Wonda Olds, MD  pravastatin (PRAVACHOL) 40 MG tablet Take 1 tablet (40 mg total) by mouth daily at 6 PM. Patient not taking: Reported on 02/16/2017 10/29/16   Angiulli, Lavon Paganini, PA-C                                                                                                                                    Past Surgical History Past Surgical History:  Procedure Laterality Date  . BRONCHOSCOPY    . TRACHEOSTOMY     Family History Family History  Problem Relation Age of Onset  . Heart attack Unknown   . Diabetes Unknown     Social History Social History   Tobacco Use  . Smoking status: Former Research scientist (life sciences)  . Smokeless tobacco: Never Used  Substance Use Topics  . Alcohol use: No  . Drug use: No   Allergies Patient has no known allergies.  Review of Systems Review of Systems All other systems are reviewed and are negative for acute change except as noted in the HPI  Physical Exam Vital Signs  I have reviewed the triage vital signs BP (!) 96/47   Pulse 96   Temp 98.9 F (37.2 C) (Oral)   Resp (!) 25   Ht 5\' 6"  (1.676 m)   Wt 73 kg (161 lb)   SpO2 100%   BMI 25.99 kg/m   Physical Exam  Constitutional: She is oriented to person, place, and time. She appears well-developed and well-nourished. No distress.  HENT:  Head: Normocephalic and atraumatic.  Nose: Nose normal.  Eyes: Conjunctivae and EOM are normal. Pupils are equal, round, and reactive to light. Right eye exhibits no discharge. Left eye exhibits no discharge. No scleral icterus.  Neck: Normal range of motion. Neck supple.  Cardiovascular: Normal rate and regular rhythm. Exam reveals no gallop and no friction rub.  No murmur heard. Pulmonary/Chest: Effort normal and breath sounds normal. No stridor. No respiratory distress. She has no rales.  Abdominal: Soft. She exhibits no distension. There is no tenderness.  Genitourinary:     Musculoskeletal: She exhibits no edema or  tenderness.       Back:       Feet:  Neurological: She is alert and oriented to person, place, and time.  Skin: Skin is warm and dry. No rash noted. She is not diaphoretic. No erythema.  Psychiatric: She has a normal  mood and affect.  Vitals reviewed.   ED Results and Treatments Labs (all labs ordered are listed, but only abnormal results are displayed) Labs Reviewed  COMPREHENSIVE METABOLIC PANEL - Abnormal; Notable for the following components:      Result Value   Potassium 5.4 (*)    Glucose, Bld 144 (*)    BUN 27 (*)    Creatinine, Ser 1.03 (*)    Calcium 8.5 (*)    Albumin 2.3 (*)    ALT 11 (*)    Total Bilirubin 1.3 (*)    GFR calc non Af Amer 49 (*)    GFR calc Af Amer 56 (*)    All other components within normal limits  CBC WITH DIFFERENTIAL/PLATELET - Abnormal; Notable for the following components:   WBC 13.6 (*)    RBC 2.80 (*)    Hemoglobin 7.6 (*)    HCT 24.0 (*)    RDW 15.8 (*)    Neutro Abs 10.8 (*)    Monocytes Absolute 1.4 (*)    All other components within normal limits  CULTURE, BLOOD (ROUTINE X 2)  CULTURE, BLOOD (ROUTINE X 2)  I-STAT CG4 LACTIC ACID, ED  POC OCCULT BLOOD, ED                                                                                                                         EKG  EKG Interpretation  Date/Time:  Friday March 13 2017 21:01:56 EST Ventricular Rate:  105 PR Interval:    QRS Duration: 80 QT Interval:  324 QTC Calculation: 429 R Axis:   51 Text Interpretation:  Sinus tachycardia Otherwise no significant change Confirmed by Addison Lank [phone]) on 03/13/2017 11:51:27 PM      Radiology Ct Abdomen Pelvis Wo Contrast  Result Date: 03/13/2017 CLINICAL DATA:  Acute onset of right leg pain. Evaluate known sacral ulceration. EXAM: CT ABDOMEN AND PELVIS WITHOUT CONTRAST TECHNIQUE: Multidetector CT imaging of the abdomen and pelvis was performed following the standard protocol without IV contrast. COMPARISON:  MRI  of the lumbar spine performed 08/09/2012 FINDINGS: Lower chest: There is elevation of the left hemidiaphragm. Scattered coronary artery calcifications are seen. The visualized right lung base is clear. Hepatobiliary: The liver is unremarkable in appearance. Minimal sludge is suggested within the gallbladder. The common bile duct remains normal in caliber. Pancreas: The pancreas is within normal limits. Spleen: The spleen is unremarkable in appearance. Adrenals/Urinary Tract: The adrenal glands are unremarkable in appearance. Mild right-sided hydronephrosis is noted; this may reflect mild mass-effect from the patient's uterine fibroids. No renal or ureteral stones are identified. No significant perinephric stranding is seen. Stomach/Bowel: The stomach is unremarkable in appearance. The small bowel is within normal limits. The appendix is not visualized; there is no evidence for appendicitis. The colon is unremarkable in appearance. Vascular/Lymphatic: Scattered calcification is seen along the abdominal aorta and its branches. The abdominal aorta is otherwise grossly unremarkable. The inferior vena cava is grossly unremarkable. No  retroperitoneal lymphadenopathy is seen. No pelvic sidewall lymphadenopathy is identified. Reproductive: The bladder is significantly distended and grossly unremarkable in appearance. Numerous prominent uterine fibroids are noted, many of which demonstrate calcification. No suspicious adnexal masses are seen. Other: A prominent sacral decubitus ulceration is noted on the left side, extending through the medial aspect of the left gluteal musculature. It measures approximately 9 x 8 cm in size. The pattern of air within the ulceration is somewhat unusual, with largely intact overlying skin. Would correlate clinically to exclude necrotizing fasciitis. There is suggestion of mild erosion at the left side of the sacrum, reflecting osteomyelitis. Stool extends across the anorectal canal, which  appears slightly deviated to the right. Musculoskeletal: No acute osseous abnormalities are identified. Mild disc space narrowing is noted at the lower lumbar spine, with endplate sclerotic change and vacuum phenomenon at L4-L5. The visualized musculature is unremarkable in appearance. IMPRESSION: 1. Prominent sacral decubitus ulceration on the left side, extending through the medial aspect of the left gluteal musculature. It measures approximately 9 x 8 cm in size. The pattern of air within the ulceration is somewhat unusual, with largely intact overlying skin. Would correlate clinically to exclude necrotizing fasciitis. 2. Suggestion of mild erosion at the left-sided sacrum, concerning for underlying osteomyelitis. 3. Mild right-sided hydronephrosis may reflect mild mass-effect from the patient's uterine fibroids. 4. Numerous prominent uterine fibroids, many of which are calcified. 5. Scattered coronary artery calcifications seen. Aortic Atherosclerosis (ICD10-I70.0). These results were called by telephone at the time of interpretation on 03/13/2017 at 11:06 pm to Dr. Addison Lank, who verbally acknowledged these results. Electronically Signed   By: Garald Balding M.D.   On: 03/13/2017 23:08   Dg Foot 2 Views Right  Result Date: 03/13/2017 CLINICAL DATA:  Foot wound along the heel. EXAM: RIGHT FOOT - 2 VIEW COMPARISON:  01/14/2017 FINDINGS: Removal of overlying fiberglass cast since prior. Disuse osteopenia is noted with mild posttraumatic change in malleoli. No new fracture is identified. There is osteoarthritic joint space narrowing of the toes and midfoot articulations. Soft tissue ulceration is noted of the heel without underlying cortical bony destruction of the calcaneus. Small calcaneal enthesophytes are present along the plantar dorsal aspect. IMPRESSION: 1. Soft tissue ulceration of the heel without radiographic evidence of acute osteomyelitis. 2. Disuse osteopenia status post cast removal. Mild  posttraumatic deformity from trimalleolar fracture is noted. Electronically Signed   By: Ashley Royalty M.D.   On: 03/13/2017 21:09   Pertinent labs & imaging results that were available during my care of the patient were reviewed by me and considered in my medical decision making (see chart for details).  Medications Ordered in ED Medications  vancomycin (VANCOCIN) 1,500 mg in sodium chloride 0.9 % 500 mL IVPB (not administered)  0.9 %  sodium chloride infusion (100 mL/hr Intravenous New Bag/Given 03/13/17 2205)  clindamycin (CLEOCIN) IVPB 600 mg (0 mg Intravenous Stopped 03/14/17 0045)  piperacillin-tazobactam (ZOSYN) IVPB 3.375 g (0 g Intravenous Stopped 03/14/17 0045)  Procedures Procedures  (including critical care time)  Medical Decision Making / ED Course I have reviewed the nursing notes for this encounter and the patient's prior records (if available in EHR or on provided paperwork).    Patient with leukocytosis.  Imaging revealed subQ air related to the sacral ulcer concerning for necrotizing infection. Started in [place], [place], and [place]. Discussed with Dr. Harlow Asa who will evaluated the patient for likely surgery. Will admit to medicine given comorbidities.  Patient also noted to have mild hyperkalemia, will be treated with IV fluids.  Also noted to have 3 g drop in 1 month of her hemoglobin.  Patient and family deny any known bleeding.  Likely secondary to oozing from the wounds however will obtain Hemoccult.   Final Clinical Impression(s) / ED Diagnoses Final diagnoses:  Wound infection  Skin ulcer of sacrum with necrosis of muscle ([place])  Diabetic ulcer of right heel associated with diabetes mellitus due to underlying condition, with necrosis of muscle ([employer])      This chart was dictated using voice recognition software.  Despite best efforts to  proofread,  errors can occur which can change the documentation meaning.   Fatima Blank, MD 03/15/17 [phone]

## 2017-03-13 NOTE — Progress Notes (Addendum)
Pharmacy Antibiotic Note  Julia Floyd is a 83 y.o. female admitted on 03/13/2017 with cellulitis.  Pharmacy has been consulted for vancomycin dosing.  Plan: Vancomycin 1500 mg x1 then 750 mg IV q24h for est AUC=443 Goal AUC = 400-500 ZEI Clindamycin 900 mg IV q8h (MD) Daily Scr  Height: 5\' 6"  (167.6 cm) Weight: 161 lb (73 kg) IBW/kg (Calculated) : 59.3  Temp (24hrs), Avg:98.9 F (37.2 C), Min:98.9 F (37.2 C), Max:98.9 F (37.2 C)  Recent Labs  Lab 03/13/17 2104 03/13/17 2123  WBC 13.6*  --   CREATININE 1.03*  --   LATICACIDVEN  --  1.47    Estimated Creatinine Clearance: 41.6 mL/min (A) (by C-G formula based on SCr of 1.03 mg/dL (H)).    No Known Allergies  Antimicrobials this admission: 12/14 vancomcyin >>   12/14 clindamycin >> 12/14 zosyn >>  Dose adjustments this admission:   Microbiology results:  BCx:   UCx:    Sputum:    MRSA PCR:   Thank you for allowing pharmacy to be a part of this patient's care.  Dorrene German 03/13/2017 11:34 PM

## 2017-03-13 NOTE — ED Triage Notes (Signed)
Patient states the Home health nurse told her to come to the ED for a wound check. Patient states her daughter brought her,but went somewhere to get hot chocolate. Patient has been in the waiting room by herself for approx 20 minutes. Patient unable to give specifics regarding her right foot.

## 2017-03-13 NOTE — ED Notes (Signed)
Patient's daughter returned and had a letter from PCP stating briefly that the patient had a right foot wound and buttock wound that were much worse today than on previous visits. Also stated that he tried to do a venous study, but due to weather it did not geet done.

## 2017-03-14 ENCOUNTER — Encounter (HOSPITAL_COMMUNITY): Payer: Self-pay | Admitting: Internal Medicine

## 2017-03-14 DIAGNOSIS — D649 Anemia, unspecified: Secondary | ICD-10-CM | POA: Diagnosis present

## 2017-03-14 DIAGNOSIS — L89159 Pressure ulcer of sacral region, unspecified stage: Secondary | ICD-10-CM | POA: Diagnosis present

## 2017-03-14 LAB — CBC WITH DIFFERENTIAL/PLATELET
BASOS PCT: 0 %
Basophils Absolute: 0 10*3/uL (ref 0.0–0.1)
EOS ABS: 0 10*3/uL (ref 0.0–0.7)
Eosinophils Relative: 0 %
HCT: 21 % — ABNORMAL LOW (ref 36.0–46.0)
Hemoglobin: 6.5 g/dL — CL (ref 12.0–15.0)
LYMPHS ABS: 1.4 10*3/uL (ref 0.7–4.0)
Lymphocytes Relative: 12 %
MCH: 27.2 pg (ref 26.0–34.0)
MCHC: 31 g/dL (ref 30.0–36.0)
MCV: 87.9 fL (ref 78.0–100.0)
MONO ABS: 1.4 10*3/uL — AB (ref 0.1–1.0)
Monocytes Relative: 12 %
NEUTROS PCT: 76 %
Neutro Abs: 8.7 10*3/uL — ABNORMAL HIGH (ref 1.7–7.7)
PLATELETS: 220 10*3/uL (ref 150–400)
RBC: 2.39 MIL/uL — AB (ref 3.87–5.11)
RDW: 15.8 % — AB (ref 11.5–15.5)
WBC: 11.5 10*3/uL — AB (ref 4.0–10.5)

## 2017-03-14 LAB — COMPREHENSIVE METABOLIC PANEL
ALK PHOS: 49 U/L (ref 38–126)
ALT: 9 U/L — AB (ref 14–54)
AST: 21 U/L (ref 15–41)
Albumin: 2 g/dL — ABNORMAL LOW (ref 3.5–5.0)
Anion gap: 6 (ref 5–15)
BUN: 24 mg/dL — ABNORMAL HIGH (ref 6–20)
CALCIUM: 8 mg/dL — AB (ref 8.9–10.3)
CO2: 25 mmol/L (ref 22–32)
CREATININE: 0.98 mg/dL (ref 0.44–1.00)
Chloride: 107 mmol/L (ref 101–111)
GFR, EST AFRICAN AMERICAN: 60 mL/min — AB (ref >60)
GFR, EST NON AFRICAN AMERICAN: 52 mL/min — AB (ref >60)
Glucose, Bld: 104 mg/dL — ABNORMAL HIGH (ref 65–99)
Potassium: 4.2 mmol/L (ref 3.5–5.1)
Sodium: 138 mmol/L (ref 135–145)
Total Bilirubin: 0.6 mg/dL (ref 0.3–1.2)
Total Protein: 6.3 g/dL — ABNORMAL LOW (ref 6.5–8.1)

## 2017-03-14 LAB — GLUCOSE, CAPILLARY
GLUCOSE-CAPILLARY: 115 mg/dL — AB (ref 65–99)
GLUCOSE-CAPILLARY: 99 mg/dL (ref 65–99)
GLUCOSE-CAPILLARY: 90 mg/dL (ref 65–99)
GLUCOSE-CAPILLARY: 80 mg/dL (ref 65–99)
Glucose-Capillary: 98 mg/dL (ref 65–99)
Glucose-Capillary: 73 mg/dL (ref 65–99)

## 2017-03-14 LAB — OCCULT BLOOD, POC DEVICE: FECAL OCCULT BLD: NEGATIVE

## 2017-03-14 LAB — ABO/RH: ABO/RH(D): B POS

## 2017-03-14 LAB — PREPARE RBC (CROSSMATCH)

## 2017-03-14 MED ORDER — HYDRALAZINE HCL 20 MG/ML IJ SOLN: 10.0000 mg | INTRAMUSCULAR | Status: DC | PRN

## 2017-03-14 MED ORDER — INSULIN ASPART 100 UNIT/ML ~~LOC~~ SOLN: 0.0000 [IU] | SUBCUTANEOUS | Status: DC

## 2017-03-14 MED ORDER — VANCOMYCIN HCL IN DEXTROSE 750-5 MG/150ML-% IV SOLN
750.0000 mg | INTRAVENOUS | Status: DC
  Administered 2017-03-15 – 2017-03-17 (×3): 750 mg via INTRAVENOUS
  Filled 2017-03-14 (×3): qty 150

## 2017-03-14 MED ORDER — ACETAMINOPHEN 325 MG PO TABS
650.0000 mg | ORAL_TABLET | Freq: Four times a day (QID) | ORAL | Status: DC | PRN
  Administered 2017-03-14 – 2017-03-29 (×16): 650 mg via ORAL
  Filled 2017-03-14 (×19): qty 2

## 2017-03-14 MED ORDER — SODIUM CHLORIDE 0.9 % IV SOLN
INTRAVENOUS | Status: AC
  Administered 2017-03-14: 03:00:00 via INTRAVENOUS

## 2017-03-14 MED ORDER — CLINDAMYCIN PHOSPHATE 900 MG/50ML IV SOLN
900.0000 mg | Freq: Three times a day (TID) | INTRAVENOUS | Status: DC
  Administered 2017-03-14: 900 mg via INTRAVENOUS
  Filled 2017-03-14 (×2): qty 50

## 2017-03-14 MED ORDER — ONDANSETRON HCL 4 MG/2ML IJ SOLN: 4.0000 mg | Freq: Four times a day (QID) | INTRAMUSCULAR | Status: DC | PRN

## 2017-03-14 MED ORDER — INSULIN ASPART 100 UNIT/ML ~~LOC~~ SOLN
0.0000 [IU] | Freq: Three times a day (TID) | SUBCUTANEOUS | Status: DC
  Administered 2017-03-15: 2 [IU] via SUBCUTANEOUS
  Administered 2017-03-15: 1 [IU] via SUBCUTANEOUS
  Administered 2017-03-19: 2 [IU] via SUBCUTANEOUS
  Administered 2017-03-21 – 2017-03-23 (×2): 1 [IU] via SUBCUTANEOUS

## 2017-03-14 MED ORDER — FUROSEMIDE 10 MG/ML IJ SOLN
20.0000 mg | Freq: Once | INTRAMUSCULAR | Status: AC
  Administered 2017-03-14: 20 mg via INTRAVENOUS
  Filled 2017-03-14: qty 2

## 2017-03-14 MED ORDER — ACETAMINOPHEN 650 MG RE SUPP: 650.0000 mg | Freq: Four times a day (QID) | RECTAL | Status: DC | PRN

## 2017-03-14 MED ORDER — ONDANSETRON HCL 4 MG PO TABS: 4.0000 mg | ORAL_TABLET | Freq: Four times a day (QID) | ORAL | Status: DC | PRN

## 2017-03-14 MED ORDER — SODIUM CHLORIDE 0.9 % IV SOLN
Freq: Once | INTRAVENOUS | Status: AC
  Administered 2017-03-14: 11:00:00 via INTRAVENOUS

## 2017-03-14 MED ORDER — PIPERACILLIN-TAZOBACTAM 3.375 G IVPB
3.3750 g | Freq: Three times a day (TID) | INTRAVENOUS | Status: DC
  Administered 2017-03-14 – 2017-03-17 (×10): 3.375 g via INTRAVENOUS
  Filled 2017-03-14 (×12): qty 50

## 2017-03-14 NOTE — Progress Notes (Signed)
[facility] PROGRESS NOTE  Julia Floyd AOZ:[id number] DOB: 1930-08-09 DOA: 03/13/2017  PCP: Josetta Huddle, MD  Brief History/Interval Summary: 83 year old African-American female with a past medical history of stroke, history of intracranial hemorrhage status post TPA, hypertension, diabetes who is largely bedbound and was brought into the hospital as patient was experiencing increasing discharge from her sacral wound.  There was also concern about right heel wound.  Evaluation in this emergency department raised concern for necrotizing fasciitis.  General surgery was consulted.  Reason for Visit: Infected sacral wound and osteomyelitis  Consultants: General surgery  Procedures: None yet  Antibiotics: Vancomycin, Zosyn and clindamycin  Subjective/Interval History: Patient denies any complaints this morning.  Denies any back pain specifically.  No shortness of breath, nausea or vomiting.  ROS: Denies any headaches.  Objective:  Vital Signs  Vitals:   03/13/17 2204 03/14/17 0006 03/14/17 0120 03/14/17 0614  BP: (!) 96/47 (!) 115/48 (!) 108/45 (!) 128/44  Pulse: 96 99 97 94  Resp: (!) 25 (!) 23 (!) 21 20  Temp:   99.5 F (37.5 C) 98 F (36.7 C)  TempSrc:   Oral Oral  SpO2: 100% 99% 100% 100%  Weight:   68.9 kg (152 lb) 68.9 kg (152 lb)  Height:   5\' 6"  (1.676 m)     Intake/Output Summary (Last 24 hours) at 03/14/2017 1145 Last data filed at 03/14/2017 0305 Gross per 24 hour  Intake 600 ml  Output -  Net 600 ml   Filed Weights   03/13/17 1322 03/14/17 0120 03/14/17 0614  Weight: 73 kg (161 lb) 68.9 kg (152 lb) 68.9 kg (152 lb)    General appearance: alert, cooperative, appears stated age and no distress Head: Normocephalic, without obvious abnormality, atraumatic Resp: clear to auscultation bilaterally Cardio: regular rate and rhythm, S1, S2 normal, no murmur, click, rub or gallop GI: soft, non-tender; bowel sounds normal; no masses,  no  organomegaly Large sacral wound Extremities: extremities normal, atraumatic, no cyanosis or edema Neurologic: Awake alert.  Old left-sided hemiparesis.  Lab Results:  Data Reviewed: I have personally reviewed following labs and imaging studies  CBC: Recent Labs  Lab 03/13/17 2104 03/14/17 0555  WBC 13.6* 11.5*  NEUTROABS 10.8* 8.7*  HGB 7.6* 6.5*  HCT 24.0* 21.0*  MCV 85.7 87.9  PLT 262 962    Basic Metabolic Panel: Recent Labs  Lab 03/13/17 2104 03/14/17 0555  NA 136 138  K 5.4* 4.2  CL 105 107  CO2 24 25  GLUCOSE 144* 104*  BUN 27* 24*  CREATININE 1.03* 0.98  CALCIUM 8.5* 8.0*    GFR: Estimated Creatinine Clearance: 40 mL/min (by C-G formula based on SCr of 0.98 mg/dL).  Liver Function Tests: Recent Labs  Lab 03/13/17 2104 03/14/17 0555  AST 36 21  ALT 11* 9*  ALKPHOS 53 49  BILITOT 1.3* 0.6  PROT 7.0 6.3*  ALBUMIN 2.3* 2.0*    CBG: Recent Labs  Lab 03/14/17 0240 03/14/17 0605 03/14/17 0835  GLUCAP 115* 98 99     Radiology Studies: Ct Abdomen Pelvis Wo Contrast  Result Date: 03/13/2017 CLINICAL DATA:  Acute onset of right leg pain. Evaluate known sacral ulceration. EXAM: CT ABDOMEN AND PELVIS WITHOUT CONTRAST TECHNIQUE: Multidetector CT imaging of the abdomen and pelvis was performed following the standard protocol without IV contrast. COMPARISON:  MRI of the lumbar spine performed 08/09/2012 FINDINGS: Lower chest: There is elevation of the left hemidiaphragm. Scattered coronary artery calcifications are seen. The visualized right  lung base is clear. Hepatobiliary: The liver is unremarkable in appearance. Minimal sludge is suggested within the gallbladder. The common bile duct remains normal in caliber. Pancreas: The pancreas is within normal limits. Spleen: The spleen is unremarkable in appearance. Adrenals/Urinary Tract: The adrenal glands are unremarkable in appearance. Mild right-sided hydronephrosis is noted; this may reflect mild mass-effect  from the patient's uterine fibroids. No renal or ureteral stones are identified. No significant perinephric stranding is seen. Stomach/Bowel: The stomach is unremarkable in appearance. The small bowel is within normal limits. The appendix is not visualized; there is no evidence for appendicitis. The colon is unremarkable in appearance. Vascular/Lymphatic: Scattered calcification is seen along the abdominal aorta and its branches. The abdominal aorta is otherwise grossly unremarkable. The inferior vena cava is grossly unremarkable. No retroperitoneal lymphadenopathy is seen. No pelvic sidewall lymphadenopathy is identified. Reproductive: The bladder is significantly distended and grossly unremarkable in appearance. Numerous prominent uterine fibroids are noted, many of which demonstrate calcification. No suspicious adnexal masses are seen. Other: A prominent sacral decubitus ulceration is noted on the left side, extending through the medial aspect of the left gluteal musculature. It measures approximately 9 x 8 cm in size. The pattern of air within the ulceration is somewhat unusual, with largely intact overlying skin. Would correlate clinically to exclude necrotizing fasciitis. There is suggestion of mild erosion at the left side of the sacrum, reflecting osteomyelitis. Stool extends across the anorectal canal, which appears slightly deviated to the right. Musculoskeletal: No acute osseous abnormalities are identified. Mild disc space narrowing is noted at the lower lumbar spine, with endplate sclerotic change and vacuum phenomenon at L4-L5. The visualized musculature is unremarkable in appearance. IMPRESSION: 1. Prominent sacral decubitus ulceration on the left side, extending through the medial aspect of the left gluteal musculature. It measures approximately 9 x 8 cm in size. The pattern of air within the ulceration is somewhat unusual, with largely intact overlying skin. Would correlate clinically to exclude  necrotizing fasciitis. 2. Suggestion of mild erosion at the left-sided sacrum, concerning for underlying osteomyelitis. 3. Mild right-sided hydronephrosis may reflect mild mass-effect from the patient's uterine fibroids. 4. Numerous prominent uterine fibroids, many of which are calcified. 5. Scattered coronary artery calcifications seen. Aortic Atherosclerosis (ICD10-I70.0). These results were called by telephone at the time of interpretation on 03/13/2017 at 11:06 pm to Dr. Addison Lank, who verbally acknowledged these results. Electronically Signed   By: Garald Balding M.D.   On: 03/13/2017 23:08   Dg Foot 2 Views Right  Result Date: 03/13/2017 CLINICAL DATA:  Foot wound along the heel. EXAM: RIGHT FOOT - 2 VIEW COMPARISON:  01/14/2017 FINDINGS: Removal of overlying fiberglass cast since prior. Disuse osteopenia is noted with mild posttraumatic change in malleoli. No new fracture is identified. There is osteoarthritic joint space narrowing of the toes and midfoot articulations. Soft tissue ulceration is noted of the heel without underlying cortical bony destruction of the calcaneus. Small calcaneal enthesophytes are present along the plantar dorsal aspect. IMPRESSION: 1. Soft tissue ulceration of the heel without radiographic evidence of acute osteomyelitis. 2. Disuse osteopenia status post cast removal. Mild posttraumatic deformity from trimalleolar fracture is noted. Electronically Signed   By: Ashley Royalty M.D.   On: 03/13/2017 21:09     Medications:  Scheduled: . furosemide  20 mg Intravenous Once  . insulin aspart  0-9 Units Subcutaneous Q4H   Continuous: . sodium chloride 125 mL/hr at 03/14/17 0305  . clindamycin (CLEOCIN) IV 900 mg (03/14/17 1049)  .  piperacillin-tazobactam (ZOSYN)  IV 3.375 g (03/14/17 5400)  . [START ON 03/15/2017] vancomycin     QQP:[id number] **OR** acetaminophen, ondansetron **OR** ondansetron (ZOFRAN) IV  Assessment/Plan:  Active Problems:   HTN  (hypertension)   Stroke (cerebrum) (HCC) -  Suspect stroke/TIA s/p tPA with multifocal ICH - etiology unclear, concerning for underlying CAA   Benign essential HTN   Diabetes mellitus type 2 in nonobese (HCC)   Wound infection   Skin ulcer of sacrum with necrosis of muscle (HCC)   Normochromic normocytic anemia    Infected sacral wound with possible necrotizing fasciitis/sacral osteomyelitis No evidence for sepsis at this time.  Lactic acid level normal.  Patient was started on vancomycin and Zosyn and clindamycin.  Follow-up on cultures.  General surgery has been consulted.  Discussed with Dr. Harlow Asa.  No plans for surgery today.  We will initiate diet.  Normocytic anemia Baseline about 10.  Stool for occult blood was negative.  Patient will be transfused 2 units of blood to begin with.  No evidence for overt bleeding.  This could all be due to chronic disease.  Unfortunately anemia panel was not sent prior to blood transfusion.  Diabetes mellitus type 2 Continue sliding scale insulin coverage.  History of essential hypertension Monitor blood pressures closely.  Blood pressure is noted to be borderline low.  Patient asymptomatic.  Previous history of stroke status post TPA complicated by intracranial hemorrhage Currently stable.  Not a candidate for antiplatelet agents or anticoagulation as per neurology previously.  Mild acute renal failure Appears to be resolved with IV fluids.  Multiple uterine fibroids possibly causing mild left-sided hydronephrosis Incidentally noted on CT scan.  Will need outpatient management.  DVT Prophylaxis: SCDs for now    Code Status: Full code Family Communication: Because of the patient and her granddaughter Disposition Plan: Await surgery input.  Blood transfusion today.  Patient lives at home.    LOS: 1 day   [facility] Pager [phone] 03/14/2017, 11:45 AM  If 7PM-7AM, please contact night-coverage at [email or web address],  password [facility]

## 2017-03-14 NOTE — H&P (Signed)
History and Physical    Julia Floyd TGG:[id number] DOB: 1931-02-05 DOA: 03/13/2017  PCP: Josetta Huddle, MD  Patient coming from: Home.  Chief Complaint: Increasing discharge from the wound.  HPI: Julia Floyd is a 83 y.o. female with history of stroke and intracranial hemorrhage status post TPA, hypertension diabetes mellitus was largely bedbound was brought to the ER after patient was having increasing discharge from the sacral wound and worsening right heel wound.  Denies any fever chills nausea vomiting diarrhea.  ED Course: In the ER he does notice that patient had a large amount of discharge from the sacral wound and had to be dressed.  CT of the abdomen and pelvis done shows features concerning for necrotizing fasciitis.  Dr. Harlow Asa, on-call general surgeon has been consulted and patient likely will go for surgery for debridement in the morning.  Patient at this time does not appear to be septic.  Patient also had a right heel wound with eschar.  X-rays do not show any osteomyelitis involving the right foot.  Review of Systems: As per HPI, rest all negative.   Past Medical History:  Diagnosis Date  . DDD (degenerative disc disease)   . Depression   . Diabetes ([place])   . HTN (hypertension)   . Psychotic disorder ([place])    secondary to the below factors. 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 Bipolar disorder, most likely type 3, this episode being manic  . Sixth cranial nerve palsy   . Stroke ([place])   . Trigeminal neuralgia   . Uterine fibroid     Past Surgical History:  Procedure Laterality Date  . BRONCHOSCOPY    . TRACHEOSTOMY       reports that she has quit smoking. she has never used smokeless tobacco. She reports that she does not drink alcohol or use drugs.  No Known Allergies  Family History  Problem Relation Age of Onset  . Heart attack Unknown   . Diabetes Unknown     Prior to Admission medications   Medication Sig Start  Date End Date Taking? Authorizing Provider  amLODipine (NORVASC) 10 MG tablet Take 1 tablet (10 mg total) by mouth daily. 10/29/16  Yes Angiulli, Lavon Paganini, PA-C  cyanocobalamin 500 MCG tablet Take 1 tablet (500 mcg total) by mouth daily. Vitamin B12 10/29/16  Yes Angiulli, Lavon Paganini, PA-C  cyclobenzaprine (FLEXERIL) 5 MG tablet Take 1 tablet (5 mg total) 3 (three) times daily as needed by mouth for muscle spasms. 02/16/17  Yes Davonna Belling, MD  furosemide (LASIX) 20 MG tablet Take 1 tablet (20 mg total) by mouth daily. 10/29/16  Yes Angiulli, Lavon Paganini, PA-C  gabapentin (NEURONTIN) 100 MG capsule Take 1-2 capsules (100-200 mg total) by mouth at bedtime. 10/29/16  Yes Angiulli, Lavon Paganini, PA-C  metFORMIN (GLUCOPHAGE) 500 MG tablet Take 1 tablet (500 mg total) by mouth 2 (two) times daily with a meal. 10/29/16  Yes Angiulli, Lavon Paganini, PA-C  Omega-3 Fatty Acids (FISH OIL PO) Take 1 capsule daily by mouth.   Yes [provider]  VITAMIN D, CHOLECALCIFEROL, PO Take 1 capsule daily by mouth.   Yes [provider]  oxyCODONE-acetaminophen (PERCOCET) 5-325 MG tablet Take 0.5 tablets by mouth every 4 (four) hours as needed. Patient not taking: Reported on 02/16/2017 01/14/17   Long, Wonda Olds, MD  pravastatin (PRAVACHOL) 40 MG tablet Take 1 tablet (40 mg total) by mouth daily at 6 PM. Patient not taking: Reported on 02/16/2017 10/29/16  Cathlyn Parsons, [place]    Physical Exam: Vitals:   03/13/17 2030 03/13/17 2204 03/14/17 0006 03/14/17 0120  BP: (!) 117/58 (!) 96/47 (!) 115/48 (!) 108/45  Pulse:  96 99 97  Resp: (!) 22 (!) 25 (!) 23 (!) 21  Temp:    99.5 F (37.5 C)  TempSrc:    Oral  SpO2:  100% 99% 100%  Weight:    68.9 kg (152 lb)  Height:    5\' 6"  (1.676 m)      Constitutional: Moderately built and nourished. Vitals:   03/13/17 2030 03/13/17 2204 03/14/17 0006 03/14/17 0120  BP: (!) 117/58 (!) 96/47 (!) 115/48 (!) 108/45  Pulse:  96 99 97  Resp: (!) 22 (!) 25 (!) 23 (!) 21    Temp:    99.5 F (37.5 C)  TempSrc:    Oral  SpO2:  100% 99% 100%  Weight:    68.9 kg (152 lb)  Height:    5\' 6"  (1.676 m)   Eyes: Anicteric no pallor. ENMT: No discharge from the ears eyes nose or mouth. Neck: No mass felt.  No neck rigidity. Respiratory: No rhonchi or crepitations. Cardiovascular: S1-S2 heard no murmurs appreciated. Abdomen: Soft nontender bowel sounds present. Musculoskeletal: Right foot has eschar and wound on the heel. Skin: Patient has large sacral wound which is difficult to assess at this time. Neurologic: Alert awake oriented to time place and person moves all extremities. Psychiatric: Appears normal.  Normal affect.   Labs on Admission: I have personally reviewed following labs and imaging studies  CBC: Recent Labs  Lab 03/13/17 2104  WBC 13.6*  NEUTROABS 10.8*  HGB 7.6*  HCT 24.0*  MCV 85.7  PLT 937   Basic Metabolic Panel: Recent Labs  Lab 03/13/17 2104  NA 136  K 5.4*  CL 105  CO2 24  GLUCOSE 144*  BUN 27*  CREATININE 1.03*  CALCIUM 8.5*   GFR: Estimated Creatinine Clearance: 38.1 mL/min (A) (by C-G formula based on SCr of 1.03 mg/dL (H)). Liver Function Tests: Recent Labs  Lab 03/13/17 2104  AST 36  ALT 11*  ALKPHOS 53  BILITOT 1.3*  PROT 7.0  ALBUMIN 2.3*   No results for input(s): LIPASE, AMYLASE in the last 168 hours. No results for input(s): AMMONIA in the last 168 hours. Coagulation Profile: No results for input(s): INR, PROTIME in the last 168 hours. Cardiac Enzymes: No results for input(s): CKTOTAL, CKMB, CKMBINDEX, TROPONINI in the last 168 hours. BNP (last 3 results) No results for input(s): PROBNP in the last 8760 hours. HbA1C: No results for input(s): HGBA1C in the last 72 hours. CBG: No results for input(s): GLUCAP in the last 168 hours. Lipid Profile: No results for input(s): CHOL, HDL, LDLCALC, TRIG, CHOLHDL, LDLDIRECT in the last 72 hours. Thyroid Function Tests: No results for input(s): TSH,  T4TOTAL, FREET4, T3FREE, THYROIDAB in the last 72 hours. Anemia Panel: No results for input(s): VITAMINB12, FOLATE, FERRITIN, TIBC, IRON, RETICCTPCT in the last 72 hours. Urine analysis:    Component Value Date/Time   COLORURINE YELLOW 01/14/2017 1425   APPEARANCEUR CLEAR 01/14/2017 1425   LABSPEC 1.012 01/14/2017 1425   PHURINE 6.0 01/14/2017 1425   GLUCOSEU NEGATIVE 01/14/2017 1425   HGBUR NEGATIVE 01/14/2017 [place] 01/14/2017 [place] 01/14/2017 [place] 01/14/2017 1425   NITRITE NEGATIVE 01/14/2017 1425   LEUKOCYTESUR NEGATIVE 01/14/2017 1425   Sepsis Labs: @LABRCNTIP (procalcitonin:4,lacticidven:4) )No results found for this  or any previous visit (from the past 240 hour(s)).   Radiological Exams on Admission: Ct Abdomen Pelvis Wo Contrast  Result Date: 03/13/2017 CLINICAL DATA:  Acute onset of right leg pain. Evaluate known sacral ulceration. EXAM: CT ABDOMEN AND PELVIS WITHOUT CONTRAST TECHNIQUE: Multidetector CT imaging of the abdomen and pelvis was performed following the standard protocol without IV contrast. COMPARISON:  MRI of the lumbar spine performed 08/09/2012 FINDINGS: Lower chest: There is elevation of the left hemidiaphragm. Scattered coronary artery calcifications are seen. The visualized right lung base is clear. Hepatobiliary: The liver is unremarkable in appearance. Minimal sludge is suggested within the gallbladder. The common bile duct remains normal in caliber. Pancreas: The pancreas is within normal limits. Spleen: The spleen is unremarkable in appearance. Adrenals/Urinary Tract: The adrenal glands are unremarkable in appearance. Mild right-sided hydronephrosis is noted; this may reflect mild mass-effect from the patient's uterine fibroids. No renal or ureteral stones are identified. No significant perinephric stranding is seen. Stomach/Bowel: The stomach is unremarkable in appearance. The small bowel is within  normal limits. The appendix is not visualized; there is no evidence for appendicitis. The colon is unremarkable in appearance. Vascular/Lymphatic: Scattered calcification is seen along the abdominal aorta and its branches. The abdominal aorta is otherwise grossly unremarkable. The inferior vena cava is grossly unremarkable. No retroperitoneal lymphadenopathy is seen. No pelvic sidewall lymphadenopathy is identified. Reproductive: The bladder is significantly distended and grossly unremarkable in appearance. Numerous prominent uterine fibroids are noted, many of which demonstrate calcification. No suspicious adnexal masses are seen. Other: A prominent sacral decubitus ulceration is noted on the left side, extending through the medial aspect of the left gluteal musculature. It measures approximately 9 x 8 cm in size. The pattern of air within the ulceration is somewhat unusual, with largely intact overlying skin. Would correlate clinically to exclude necrotizing fasciitis. There is suggestion of mild erosion at the left side of the sacrum, reflecting osteomyelitis. Stool extends across the anorectal canal, which appears slightly deviated to the right. Musculoskeletal: No acute osseous abnormalities are identified. Mild disc space narrowing is noted at the lower lumbar spine, with endplate sclerotic change and vacuum phenomenon at L4-L5. The visualized musculature is unremarkable in appearance. IMPRESSION: 1. Prominent sacral decubitus ulceration on the left side, extending through the medial aspect of the left gluteal musculature. It measures approximately 9 x 8 cm in size. The pattern of air within the ulceration is somewhat unusual, with largely intact overlying skin. Would correlate clinically to exclude necrotizing fasciitis. 2. Suggestion of mild erosion at the left-sided sacrum, concerning for underlying osteomyelitis. 3. Mild right-sided hydronephrosis may reflect mild mass-effect from the patient's uterine  fibroids. 4. Numerous prominent uterine fibroids, many of which are calcified. 5. Scattered coronary artery calcifications seen. Aortic Atherosclerosis (ICD10-I70.0). These results were called by telephone at the time of interpretation on 03/13/2017 at 11:06 pm to Dr. Addison Lank, who verbally acknowledged these results. Electronically Signed   By: Garald Balding M.D.   On: 03/13/2017 23:08   Dg Foot 2 Views Right  Result Date: 03/13/2017 CLINICAL DATA:  Foot wound along the heel. EXAM: RIGHT FOOT - 2 VIEW COMPARISON:  01/14/2017 FINDINGS: Removal of overlying fiberglass cast since prior. Disuse osteopenia is noted with mild posttraumatic change in malleoli. No new fracture is identified. There is osteoarthritic joint space narrowing of the toes and midfoot articulations. Soft tissue ulceration is noted of the heel without underlying cortical bony destruction of the calcaneus. Small calcaneal enthesophytes are present along  the plantar dorsal aspect. IMPRESSION: 1. Soft tissue ulceration of the heel without radiographic evidence of acute osteomyelitis. 2. Disuse osteopenia status post cast removal. Mild posttraumatic deformity from trimalleolar fracture is noted. Electronically Signed   By: Ashley Royalty M.D.   On: 03/13/2017 21:09     Assessment/Plan Active Problems:   HTN (hypertension)   Stroke (cerebrum) (HCC) -  Suspect stroke/TIA s/p tPA with multifocal ICH - etiology unclear, concerning for underlying CAA   Benign essential HTN   Diabetes mellitus type 2 in nonobese (HCC)   Wound infection   Skin ulcer of sacrum with necrosis of muscle (HCC)   Normochromic normocytic anemia    1. Sacral wound infection with possible necrotizing fasciitis/osteomyelitis -patient does not look septic at this time.  Lactic acid is normal.  Patient is placed empirically on vancomycin Zosyn and clindamycin.  General surgery has been consulted patient is kept n.p.o. in anticipation of surgical debridement in the  morning.  Patient also has wound on the right heel. 2. Hypertension -blood pressure is in the low normal range.  PRN IV hydralazine for systolic blood pressure more than 140. 3. Diabetes mellitus type 2 -we will keep patient on sliding scale coverage. 4. Normocytic normochromic anemia -anemia has worsened from her baseline of 10.  Stool for occult blood is negative.  Type and screen has been ordered.  If there is further decrease in hemoglobin will need transfusion. 5. History of stroke status post TPA with intracranial hemorrhage -as per the neurologist on patient not candidate for antiplatelet or anticoagulation.   DVT prophylaxis: SCDs. Code Status: Full code. Family Communication: Discussed with patient. Disposition Plan: To be determined. Consults called: General surgery. Admission status: Inpatient.   Rise Patience MD [facility] Pager [phone].  If 7PM-7AM, please contact night-coverage [email or web address] Password [email or web address]  03/14/2017, 2:17 AM

## 2017-03-14 NOTE — Consult Note (Signed)
General Surgery [facility]  Reason for Consult: sacral decubitus ulcer  Referring Physician: Dr. Maryland Pink, [facility]  Julia Floyd is an 83 y.o. female.  HPI: patient is an 83 yo BF admitted from the ER with infected sacral decubitus ulceration.  Patient admitted to SNF due to foot surgery and CVA's.  Developed sacral decubitus ulceration.  Now with leukocytosis, WBC 13.6K.  CT demonstrates ulcerative wound over sacrum with underlying osteomyelitis.  General surgery consulted for initial wound debridement and management.  Non-ambulatory.  Past Medical History:  Diagnosis Date  . DDD (degenerative disc disease)   . Depression   . Diabetes ([place])   . HTN (hypertension)   . Psychotic disorder ([place])    secondary to the below factors. 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 Bipolar disorder, most likely type 3, this episode being manic  . Sixth cranial nerve palsy   . Stroke ([place])   . Trigeminal neuralgia   . Uterine fibroid     Past Surgical History:  Procedure Laterality Date  . BRONCHOSCOPY    . TRACHEOSTOMY      Family History  Problem Relation Age of Onset  . Heart attack Unknown   . Diabetes Unknown     Social History:  reports that she has quit smoking. she has never used smokeless tobacco. She reports that she does not drink alcohol or use drugs.  Allergies: No Known Allergies  Medications: I have reviewed the patient's current medications.  Results for orders placed or performed during the hospital encounter of 03/13/17 (from the past 48 hour(s))  Comprehensive metabolic panel     Status: Abnormal   Collection Time: 03/13/17  9:04 PM  Result Value Ref Range   Sodium 136 135 - 145 mmol/L   Potassium 5.4 (H) 3.5 - 5.1 mmol/L   Chloride 105 101 - 111 mmol/L   CO2 24 22 - 32 mmol/L   Glucose, Bld 144 (H) 65 - 99 mg/dL   BUN 27 (H) 6 - 20 mg/dL   Creatinine, Ser 1.03 (H) 0.44 - 1.00 mg/dL   Calcium 8.5 (L) 8.9  - 10.3 mg/dL   Total Protein 7.0 6.5 - 8.1 g/dL   Albumin 2.3 (L) 3.5 - 5.0 g/dL   AST 36 15 - 41 U/L   ALT 11 (L) 14 - 54 U/L   Alkaline Phosphatase 53 38 - 126 U/L   Total Bilirubin 1.3 (H) 0.3 - 1.2 mg/dL   GFR calc non Af Amer 49 (L) >60 mL/min   GFR calc Af Amer 56 (L) >60 mL/min    Comment: (NOTE) The eGFR has been calculated using the CKD EPI equation. This calculation has not been validated in all clinical situations. eGFR's persistently <60 mL/min signify possible Chronic Kidney Disease.    Anion gap 7 5 - 15  CBC WITH DIFFERENTIAL     Status: Abnormal   Collection Time: 03/13/17  9:04 PM  Result Value Ref Range   WBC 13.6 (H) 4.0 - 10.5 K/uL   RBC 2.80 (L) 3.87 - 5.11 MIL/uL   Hemoglobin 7.6 (L) 12.0 - 15.0 g/dL   HCT 24.0 (L) 36.0 - 46.0 %   MCV 85.7 78.0 - 100.0 fL   MCH 27.1 26.0 - 34.0 pg   MCHC 31.7 30.0 - 36.0 g/dL   RDW 15.8 (H) 11.5 - 15.5 %   Platelets 262 150 - 400 K/uL   Neutrophils Relative % 79 %   Neutro Abs 10.8 (  H) 1.7 - 7.7 K/uL   Lymphocytes Relative 10 %   Lymphs Abs 1.4 0.7 - 4.0 K/uL   Monocytes Relative 11 %   Monocytes Absolute 1.4 (H) 0.1 - 1.0 K/uL   Eosinophils Relative 0 %   Eosinophils Absolute 0.0 0.0 - 0.7 K/uL   Basophils Relative 0 %   Basophils Absolute 0.0 0.0 - 0.1 K/uL  I-Stat CG4 Lactic Acid, ED  (not at  [facility])     Status: None   Collection Time: 03/13/17  9:23 PM  Result Value Ref Range   Lactic Acid, Venous 1.47 0.5 - 1.9 mmol/L  Occult blood, poc device     Status: None   Collection Time: 03/14/17  1:10 AM  Result Value Ref Range   Fecal Occult Bld NEGATIVE NEGATIVE  Glucose, capillary     Status: Abnormal   Collection Time: 03/14/17  2:40 AM  Result Value Ref Range   Glucose-Capillary 115 (H) 65 - 99 mg/dL  Comprehensive metabolic panel     Status: Abnormal   Collection Time: 03/14/17  5:55 AM  Result Value Ref Range   Sodium 138 135 - 145 mmol/L   Potassium 4.2 3.5 - 5.1 mmol/L    Comment: DELTA CHECK NOTED    Chloride 107 101 - 111 mmol/L   CO2 25 22 - 32 mmol/L   Glucose, Bld 104 (H) 65 - 99 mg/dL   BUN 24 (H) 6 - 20 mg/dL   Creatinine, Ser 0.98 0.44 - 1.00 mg/dL   Calcium 8.0 (L) 8.9 - 10.3 mg/dL   Total Protein 6.3 (L) 6.5 - 8.1 g/dL   Albumin 2.0 (L) 3.5 - 5.0 g/dL   AST 21 15 - 41 U/L   ALT 9 (L) 14 - 54 U/L   Alkaline Phosphatase 49 38 - 126 U/L   Total Bilirubin 0.6 0.3 - 1.2 mg/dL   GFR calc non Af Amer 52 (L) >60 mL/min   GFR calc Af Amer 60 (L) >60 mL/min    Comment: (NOTE) The eGFR has been calculated using the CKD EPI equation. This calculation has not been validated in all clinical situations. eGFR's persistently <60 mL/min signify possible Chronic Kidney Disease.    Anion gap 6 5 - 15  CBC WITH DIFFERENTIAL     Status: Abnormal   Collection Time: 03/14/17  5:55 AM  Result Value Ref Range   WBC 11.5 (H) 4.0 - 10.5 K/uL   RBC 2.39 (L) 3.87 - 5.11 MIL/uL   Hemoglobin 6.5 (LL) 12.0 - 15.0 g/dL    Comment: CRITICAL RESULT CALLED TO, READ BACK BY AND VERIFIED WITH: V.WILLIAMS RN 03/14/2017 0845 JR    HCT 21.0 (L) 36.0 - 46.0 %   MCV 87.9 78.0 - 100.0 fL   MCH 27.2 26.0 - 34.0 pg   MCHC 31.0 30.0 - 36.0 g/dL   RDW 15.8 (H) 11.5 - 15.5 %   Platelets 220 150 - 400 K/uL   Neutrophils Relative % 76 %   Lymphocytes Relative 12 %   Monocytes Relative 12 %   Eosinophils Relative 0 %   Basophils Relative 0 %   Neutro Abs 8.7 (H) 1.7 - 7.7 K/uL   Lymphs Abs 1.4 0.7 - 4.0 K/uL   Monocytes Absolute 1.4 (H) 0.1 - 1.0 K/uL   Eosinophils Absolute 0.0 0.0 - 0.7 K/uL   Basophils Absolute 0.0 0.0 - 0.1 K/uL   RBC Morphology POLYCHROMASIA PRESENT     Comment: TARGET CELLS  Glucose, capillary  Status: None   Collection Time: 03/14/17  6:05 AM  Result Value Ref Range   Glucose-Capillary 98 65 - 99 mg/dL  Glucose, capillary     Status: None   Collection Time: 03/14/17  8:35 AM  Result Value Ref Range   Glucose-Capillary 99 65 - 99 mg/dL   Comment 1 Notify RN   Type and screen  [place]     Status: None (Preliminary result)   Collection Time: 03/14/17  9:30 AM  Result Value Ref Range   ABO/RH(D) B POS    Antibody Screen NEG    Sample Expiration 03/17/2017    Unit Number [id number]    Blood Component Type RED CELLS,LR    Unit division 00    Status of Unit ALLOCATED    Transfusion Status OK TO TRANSFUSE    Crossmatch Result Compatible    Unit Number [id number]    Blood Component Type RED CELLS,LR    Unit division 00    Status of Unit ALLOCATED    Transfusion Status OK TO TRANSFUSE    Crossmatch Result Compatible    Unit Number [id number]    Blood Component Type RED CELLS,LR    Unit division 00    Status of Unit ALLOCATED    Transfusion Status OK TO TRANSFUSE    Crossmatch Result Compatible    Unit Number [id number]    Blood Component Type RED CELLS,LR    Unit division 00    Status of Unit ALLOCATED    Transfusion Status OK TO TRANSFUSE    Crossmatch Result Compatible   ABO/Rh     Status: None   Collection Time: 03/14/17  9:30 AM  Result Value Ref Range   ABO/RH(D) B POS   Prepare RBC     Status: None   Collection Time: 03/14/17  9:30 AM  Result Value Ref Range   Order Confirmation ORDER PROCESSED BY BLOOD BANK     Ct Abdomen Pelvis Wo Contrast  Result Date: 03/13/2017 CLINICAL DATA:  Acute onset of right leg pain. Evaluate known sacral ulceration. EXAM: CT ABDOMEN AND PELVIS WITHOUT CONTRAST TECHNIQUE: Multidetector CT imaging of the abdomen and pelvis was performed following the standard protocol without IV contrast. COMPARISON:  MRI of the lumbar spine performed 08/09/2012 FINDINGS: Lower chest: There is elevation of the left hemidiaphragm. Scattered coronary artery calcifications are seen. The visualized right lung base is clear. Hepatobiliary: The liver is unremarkable in appearance. Minimal sludge is suggested within the gallbladder. The common bile duct remains normal in caliber. Pancreas: The pancreas is  within normal limits. Spleen: The spleen is unremarkable in appearance. Adrenals/Urinary Tract: The adrenal glands are unremarkable in appearance. Mild right-sided hydronephrosis is noted; this may reflect mild mass-effect from the patient's uterine fibroids. No renal or ureteral stones are identified. No significant perinephric stranding is seen. Stomach/Bowel: The stomach is unremarkable in appearance. The small bowel is within normal limits. The appendix is not visualized; there is no evidence for appendicitis. The colon is unremarkable in appearance. Vascular/Lymphatic: Scattered calcification is seen along the abdominal aorta and its branches. The abdominal aorta is otherwise grossly unremarkable. The inferior vena cava is grossly unremarkable. No retroperitoneal lymphadenopathy is seen. No pelvic sidewall lymphadenopathy is identified. Reproductive: The bladder is significantly distended and grossly unremarkable in appearance. Numerous prominent uterine fibroids are noted, many of which demonstrate calcification. No suspicious adnexal masses are seen. Other: A prominent sacral decubitus ulceration is noted on the left side, extending through the medial aspect  of the left gluteal musculature. It measures approximately 9 x 8 cm in size. The pattern of air within the ulceration is somewhat unusual, with largely intact overlying skin. Would correlate clinically to exclude necrotizing fasciitis. There is suggestion of mild erosion at the left side of the sacrum, reflecting osteomyelitis. Stool extends across the anorectal canal, which appears slightly deviated to the right. Musculoskeletal: No acute osseous abnormalities are identified. Mild disc space narrowing is noted at the lower lumbar spine, with endplate sclerotic change and vacuum phenomenon at L4-L5. The visualized musculature is unremarkable in appearance. IMPRESSION: 1. Prominent sacral decubitus ulceration on the left side, extending through the medial  aspect of the left gluteal musculature. It measures approximately 9 x 8 cm in size. The pattern of air within the ulceration is somewhat unusual, with largely intact overlying skin. Would correlate clinically to exclude necrotizing fasciitis. 2. Suggestion of mild erosion at the left-sided sacrum, concerning for underlying osteomyelitis. 3. Mild right-sided hydronephrosis may reflect mild mass-effect from the patient's uterine fibroids. 4. Numerous prominent uterine fibroids, many of which are calcified. 5. Scattered coronary artery calcifications seen. Aortic Atherosclerosis (ICD10-I70.0). These results were called by telephone at the time of interpretation on 03/13/2017 at 11:06 pm to Dr. Addison Lank, who verbally acknowledged these results. Electronically Signed   By: Garald Balding M.D.   On: 03/13/2017 23:08   Dg Foot 2 Views Right  Result Date: 03/13/2017 CLINICAL DATA:  Foot wound along the heel. EXAM: RIGHT FOOT - 2 VIEW COMPARISON:  01/14/2017 FINDINGS: Removal of overlying fiberglass cast since prior. Disuse osteopenia is noted with mild posttraumatic change in malleoli. No new fracture is identified. There is osteoarthritic joint space narrowing of the toes and midfoot articulations. Soft tissue ulceration is noted of the heel without underlying cortical bony destruction of the calcaneus. Small calcaneal enthesophytes are present along the plantar dorsal aspect. IMPRESSION: 1. Soft tissue ulceration of the heel without radiographic evidence of acute osteomyelitis. 2. Disuse osteopenia status post cast removal. Mild posttraumatic deformity from trimalleolar fracture is noted. Electronically Signed   By: Ashley Royalty M.D.   On: 03/13/2017 21:09    Review of Systems  Constitutional: Positive for fever.  HENT: Negative.   Eyes: Negative.   Respiratory: Negative.   Cardiovascular: Negative.   Gastrointestinal: Negative.   Genitourinary: Negative.   Musculoskeletal: Negative.   Skin:        Skin breakdown sacral area and foot  Neurological: Negative.   Endo/Heme/Allergies: Negative.   Psychiatric/Behavioral: Negative.    Blood pressure (!) 128/44, pulse 94, temperature 98 F (36.7 C), temperature source Oral, resp. rate 20, height 5' 6"  (1.676 m), weight 68.9 kg (152 lb), SpO2 100 %. Physical Exam  Constitutional:  Patient in bed, family at bedside.  HENT:  Head: Normocephalic and atraumatic.  Right Ear: External ear normal.  Left Ear: External ear normal.  Mouth/Throat: No oropharyngeal exudate.  Eyes: Conjunctivae are normal. Pupils are equal, round, and reactive to light. No scleral icterus.  Neck: Normal range of motion. Neck supple. No tracheal deviation present. No thyromegaly present.  Cardiovascular: Normal rate, regular rhythm and normal heart sounds.  Respiratory: Effort normal and breath sounds normal. No respiratory distress. She has no wheezes.  GI: Soft. She exhibits no distension. There is no tenderness.  Neurological: She is alert.  Skin: Skin is warm and dry.  Sacral region - no dressing in place; large area approx 15 x 12 cm of full thickness necrosis with dry eschar;  central full thickness ulceration approx 3 cm in diameter with underlying necrotic dry tissue; no cellulitis; no crepitance; small serous foul-smelling drainage.  Dry gauze dressing placed.  Second smaller (5x3cm) area of ulceration below wound and to left of midline with area of central necrosis (2x3cm).    Assessment/Plan: Sacral decubitus ulcerations (approx 15x12 cm and 5x3 cm), unstageable due to large dry eschar  Agree with admission to medical service  Optimize medical status, fluid resuscitation, initiate abx, transfuse for anemia  Dressing changes with dry gauze for now  Will require operative debridement, extensive, under anesthesia when medical stable - likely Monday 12/17  Discussed with family and nursing at bedside.  No evidence of sepsis or necrotizing fasciitis.  Given  underlying osteomyelitis, may need to consider ID consultation.  Goal of care will be local wound control and hygiene.  Since patient non-ambulatory, chance for full wound healing is non-existent.  Will follow and arrange for operative debridement - tentatively Monday 12/17.  Armandina Gemma, [facility] Office: [place] 03/14/2017, 11:43 AM

## 2017-03-14 NOTE — ED Notes (Signed)
ED TO INPATIENT HANDOFF REPORT  Name/Age/Gender Macie Burows A Goodell 83 y.o. female  Code Status Code Status History    Date Active Date Inactive Code Status Order ID Comments User Context   10/14/2016 19:10 10/29/2016 20:19 Full Code [id number]  Elizabeth Sauer Inpatient   10/14/2016 19:09 10/14/2016 19:10 Full Code [id number]  Elizabeth Sauer Inpatient   10/10/2016 11:10 10/14/2016 18:50 Full Code [id number]  Charm Rings, NP Inpatient   10/07/2016 21:01 10/10/2016 11:10 DNR [id number]  Greta Doom, MD Inpatient      Home/SNF/Other Home  Chief Complaint wound check  Level of Care/Admitting Diagnosis ED Disposition    ED Disposition Condition [place]: [facility] [[id number]]  Level of Care: Telemetry [5]  Admit to tele based on following criteria: Monitor for Ischemic changes  Diagnosis: Wound infection [707867]  Admitting Physician: Rise Patience [phone]  Attending Physician: Rise Patience [phone]  Estimated length of stay: past midnight tomorrow  Certification:: I certify this patient will need inpatient services for at least 2 midnights  PT Class (Do Not Modify): Inpatient [101]  PT Acc Code (Do Not Modify): Private [1]       Medical History Past Medical History:  Diagnosis Date  . DDD (degenerative disc disease)   . Depression   . Diabetes ([place])   . HTN (hypertension)   . Psychotic disorder ([place])    secondary to the below factors. 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 Bipolar disorder, most likely type 3, this episode being manic  . Sixth cranial nerve palsy   . Stroke ([place])   . Trigeminal neuralgia   . Uterine fibroid     Allergies No Known Allergies  IV Location/Drains/Wounds Patient Lines/Drains/Airways Status   Active Line/Drains/Airways    Name:   Placement date:   Placement time:   Site:   Days:   Peripheral IV 03/13/17 Left Forearm   03/13/17    2110     Forearm   1   Urethral Catheter Hulan Amato RN Non-latex 16 Fr.   01/14/17    1632    Non-latex   59   External Urinary Catheter   10/19/16    2217    -   146   Pressure Injury Stage II -  Partial thickness loss of dermis presenting as a shallow open ulcer with a red, pink wound bed without slough.   -    -               Labs/Imaging Results for orders placed or performed during the hospital encounter of 03/13/17 (from the past 48 hour(s))  Comprehensive metabolic panel     Status: Abnormal   Collection Time: 03/13/17  9:04 PM  Result Value Ref Range   Sodium 136 135 - 145 mmol/L   Potassium 5.4 (H) 3.5 - 5.1 mmol/L   Chloride 105 101 - 111 mmol/L   CO2 24 22 - 32 mmol/L   Glucose, Bld 144 (H) 65 - 99 mg/dL   BUN 27 (H) 6 - 20 mg/dL   Creatinine, Ser 1.03 (H) 0.44 - 1.00 mg/dL   Calcium 8.5 (L) 8.9 - 10.3 mg/dL   Total Protein 7.0 6.5 - 8.1 g/dL   Albumin 2.3 (L) 3.5 - 5.0 g/dL   AST 36 15 - 41 U/L   ALT 11 (L) 14 - 54 U/L   Alkaline Phosphatase 53 38 - 126 U/L  Total Bilirubin 1.3 (H) 0.3 - 1.2 mg/dL   GFR calc non Af Amer 49 (L) >60 mL/min   GFR calc Af Amer 56 (L) >60 mL/min    Comment: (NOTE) The eGFR has been calculated using the CKD EPI equation. This calculation has not been validated in all clinical situations. eGFR's persistently <60 mL/min signify possible Chronic Kidney Disease.    Anion gap 7 5 - 15  CBC WITH DIFFERENTIAL     Status: Abnormal   Collection Time: 03/13/17  9:04 PM  Result Value Ref Range   WBC 13.6 (H) 4.0 - 10.5 K/uL   RBC 2.80 (L) 3.87 - 5.11 MIL/uL   Hemoglobin 7.6 (L) 12.0 - 15.0 g/dL   HCT 24.0 (L) 36.0 - 46.0 %   MCV 85.7 78.0 - 100.0 fL   MCH 27.1 26.0 - 34.0 pg   MCHC 31.7 30.0 - 36.0 g/dL   RDW 15.8 (H) 11.5 - 15.5 %   Platelets 262 150 - 400 K/uL   Neutrophils Relative % 79 %   Neutro Abs 10.8 (H) 1.7 - 7.7 K/uL   Lymphocytes Relative 10 %   Lymphs Abs 1.4 0.7 - 4.0 K/uL   Monocytes Relative 11 %   Monocytes Absolute 1.4 (H)  0.1 - 1.0 K/uL   Eosinophils Relative 0 %   Eosinophils Absolute 0.0 0.0 - 0.7 K/uL   Basophils Relative 0 %   Basophils Absolute 0.0 0.0 - 0.1 K/uL  I-Stat CG4 Lactic Acid, ED  (not at  [facility])     Status: None   Collection Time: 03/13/17  9:23 PM  Result Value Ref Range   Lactic Acid, Venous 1.47 0.5 - 1.9 mmol/L   Ct Abdomen Pelvis Wo Contrast  Result Date: 03/13/2017 CLINICAL DATA:  Acute onset of right leg pain. Evaluate known sacral ulceration. EXAM: CT ABDOMEN AND PELVIS WITHOUT CONTRAST TECHNIQUE: Multidetector CT imaging of the abdomen and pelvis was performed following the standard protocol without IV contrast. COMPARISON:  MRI of the lumbar spine performed 08/09/2012 FINDINGS: Lower chest: There is elevation of the left hemidiaphragm. Scattered coronary artery calcifications are seen. The visualized right lung base is clear. Hepatobiliary: The liver is unremarkable in appearance. Minimal sludge is suggested within the gallbladder. The common bile duct remains normal in caliber. Pancreas: The pancreas is within normal limits. Spleen: The spleen is unremarkable in appearance. Adrenals/Urinary Tract: The adrenal glands are unremarkable in appearance. Mild right-sided hydronephrosis is noted; this may reflect mild mass-effect from the patient's uterine fibroids. No renal or ureteral stones are identified. No significant perinephric stranding is seen. Stomach/Bowel: The stomach is unremarkable in appearance. The small bowel is within normal limits. The appendix is not visualized; there is no evidence for appendicitis. The colon is unremarkable in appearance. Vascular/Lymphatic: Scattered calcification is seen along the abdominal aorta and its branches. The abdominal aorta is otherwise grossly unremarkable. The inferior vena cava is grossly unremarkable. No retroperitoneal lymphadenopathy is seen. No pelvic sidewall lymphadenopathy is identified. Reproductive: The bladder is significantly distended  and grossly unremarkable in appearance. Numerous prominent uterine fibroids are noted, many of which demonstrate calcification. No suspicious adnexal masses are seen. Other: A prominent sacral decubitus ulceration is noted on the left side, extending through the medial aspect of the left gluteal musculature. It measures approximately 9 x 8 cm in size. The pattern of air within the ulceration is somewhat unusual, with largely intact overlying skin. Would correlate clinically to exclude necrotizing fasciitis. There is suggestion of mild  erosion at the left side of the sacrum, reflecting osteomyelitis. Stool extends across the anorectal canal, which appears slightly deviated to the right. Musculoskeletal: No acute osseous abnormalities are identified. Mild disc space narrowing is noted at the lower lumbar spine, with endplate sclerotic change and vacuum phenomenon at L4-L5. The visualized musculature is unremarkable in appearance. IMPRESSION: 1. Prominent sacral decubitus ulceration on the left side, extending through the medial aspect of the left gluteal musculature. It measures approximately 9 x 8 cm in size. The pattern of air within the ulceration is somewhat unusual, with largely intact overlying skin. Would correlate clinically to exclude necrotizing fasciitis. 2. Suggestion of mild erosion at the left-sided sacrum, concerning for underlying osteomyelitis. 3. Mild right-sided hydronephrosis may reflect mild mass-effect from the patient's uterine fibroids. 4. Numerous prominent uterine fibroids, many of which are calcified. 5. Scattered coronary artery calcifications seen. Aortic Atherosclerosis (ICD10-I70.0). These results were called by telephone at the time of interpretation on 03/13/2017 at 11:06 pm to Dr. Addison Lank, who verbally acknowledged these results. Electronically Signed   By: Garald Balding M.D.   On: 03/13/2017 23:08   Dg Foot 2 Views Right  Result Date: 03/13/2017 CLINICAL DATA:  Foot wound  along the heel. EXAM: RIGHT FOOT - 2 VIEW COMPARISON:  01/14/2017 FINDINGS: Removal of overlying fiberglass cast since prior. Disuse osteopenia is noted with mild posttraumatic change in malleoli. No new fracture is identified. There is osteoarthritic joint space narrowing of the toes and midfoot articulations. Soft tissue ulceration is noted of the heel without underlying cortical bony destruction of the calcaneus. Small calcaneal enthesophytes are present along the plantar dorsal aspect. IMPRESSION: 1. Soft tissue ulceration of the heel without radiographic evidence of acute osteomyelitis. 2. Disuse osteopenia status post cast removal. Mild posttraumatic deformity from trimalleolar fracture is noted. Electronically Signed   By: Ashley Royalty M.D.   On: 03/13/2017 21:09    Pending Labs Unresulted Labs (From admission, onward)   Start     Ordered   03/13/17 2328  Blood culture (routine x 2)  BLOOD CULTURE X 2,   STAT     03/13/17 2327      Vitals/Pain Today's Vitals   03/13/17 2004 03/13/17 2030 03/13/17 2204 03/14/17 0006  BP: (!) 118/47 (!) 117/58 (!) 96/47 (!) 115/48  Pulse: (!) 107  96 99  Resp: 14 (!) 22 (!) 25 (!) 23  Temp: 98.9 F (37.2 C)     TempSrc: Oral     SpO2: 98%  100% 99%  Weight:      Height:      PainSc: 0-No pain       Isolation Precautions No active isolations  Medications Medications  vancomycin (VANCOCIN) 1,500 mg in sodium chloride 0.9 % 500 mL IVPB (1,500 mg Intravenous New Bag/Given 03/14/17 0103)  0.9 %  sodium chloride infusion (100 mL/hr Intravenous New Bag/Given 03/13/17 2205)  clindamycin (CLEOCIN) IVPB 600 mg (0 mg Intravenous Stopped 03/14/17 0045)  piperacillin-tazobactam (ZOSYN) IVPB 3.375 g (0 g Intravenous Stopped 03/14/17 0045)    Mobility Bedridden  Report given to [facility]

## 2017-03-15 LAB — GLUCOSE, CAPILLARY
GLUCOSE-CAPILLARY: 121 mg/dL — AB (ref 65–99)
GLUCOSE-CAPILLARY: 108 mg/dL — AB (ref 65–99)
Glucose-Capillary: 99 mg/dL (ref 65–99)
Glucose-Capillary: 177 mg/dL — ABNORMAL HIGH (ref 65–99)

## 2017-03-15 LAB — CBC
HEMATOCRIT: 29.2 % — AB (ref 36.0–46.0)
HEMOGLOBIN: 9.4 g/dL — AB (ref 12.0–15.0)
MCH: 27.2 pg (ref 26.0–34.0)
MCHC: 32.2 g/dL (ref 30.0–36.0)
MCV: 84.4 fL (ref 78.0–100.0)
PLATELETS: 200 10*3/uL (ref 150–400)
RBC: 3.46 MIL/uL — AB (ref 3.87–5.11)
RDW: 17 % — ABNORMAL HIGH (ref 11.5–15.5)
WBC: 10.4 10*3/uL (ref 4.0–10.5)

## 2017-03-15 LAB — BLOOD CULTURE ID PANEL (REFLEXED)

## 2017-03-15 LAB — BASIC METABOLIC PANEL
Anion gap: 7 (ref 5–15)
BUN: 16 mg/dL (ref 6–20)
CHLORIDE: 108 mmol/L (ref 101–111)
CO2: 22 mmol/L (ref 22–32)
Calcium: 8.1 mg/dL — ABNORMAL LOW (ref 8.9–10.3)
Creatinine, Ser: 0.82 mg/dL (ref 0.44–1.00)
GFR calc Af Amer: >60 mL/min (ref >60)
GFR calc non Af Amer: >60 mL/min (ref >60)
GLUCOSE: 102 mg/dL — AB (ref 65–99)
POTASSIUM: 3.8 mmol/L (ref 3.5–5.1)
Sodium: 137 mmol/L (ref 135–145)

## 2017-03-15 MED ORDER — ENSURE ENLIVE PO LIQD
237.0000 mL | Freq: Two times a day (BID) | ORAL | Status: DC
  Administered 2017-03-15 – 2017-03-30 (×8): 237 mL via ORAL

## 2017-03-15 MED ORDER — SODIUM CHLORIDE 0.9 % IV SOLN
INTRAVENOUS | Status: AC
  Administered 2017-03-15: 20:00:00 via INTRAVENOUS

## 2017-03-15 NOTE — Progress Notes (Signed)
PHARMACY - PHYSICIAN COMMUNICATION CRITICAL VALUE ALERT - BLOOD CULTURE IDENTIFICATION (BCID)  Julia Floyd is an 83 y.o. female who presented to [facility] on 03/13/2017 with a chief complaint of wound drainage   Assessment:  71 YOF with sacral ulcers with likely osteomyelitis.  BCx currently 1/2 GPC clusters in pediatric bottle only.  BCID reveals MRSE.   Name of physician (or Provider) Contacted: Dr. Curly Rim  Current antibiotics: vancomycin and zosyn   Changes to prescribed antibiotics recommended:  Patient is on recommended antibiotics - No changes needed - continue abx for underlying infection.  Bcx may be contaminant  Results for orders placed or performed during the hospital encounter of 03/13/17  Blood Culture ID Panel (Reflexed) (Collected: 03/13/2017 11:56 PM)  Result Value Ref Range   Enterococcus species NOT DETECTED NOT DETECTED   Listeria monocytogenes NOT DETECTED NOT DETECTED   Staphylococcus species DETECTED (A) NOT DETECTED   Staphylococcus aureus NOT DETECTED NOT DETECTED   Methicillin resistance DETECTED (A) NOT DETECTED   Streptococcus species NOT DETECTED NOT DETECTED   Streptococcus agalactiae NOT DETECTED NOT DETECTED   Streptococcus pneumoniae NOT DETECTED NOT DETECTED   Streptococcus pyogenes NOT DETECTED NOT DETECTED   Acinetobacter baumannii NOT DETECTED NOT DETECTED   Enterobacteriaceae species NOT DETECTED NOT DETECTED   Enterobacter cloacae complex NOT DETECTED NOT DETECTED   Escherichia coli NOT DETECTED NOT DETECTED   Klebsiella oxytoca NOT DETECTED NOT DETECTED   Klebsiella pneumoniae NOT DETECTED NOT DETECTED   Proteus species NOT DETECTED NOT DETECTED   Serratia marcescens NOT DETECTED NOT DETECTED   Haemophilus influenzae NOT DETECTED NOT DETECTED   Neisseria meningitidis NOT DETECTED NOT DETECTED   Pseudomonas aeruginosa NOT DETECTED NOT DETECTED   Candida albicans NOT DETECTED NOT DETECTED   Candida glabrata NOT DETECTED NOT  DETECTED   Candida krusei NOT DETECTED NOT DETECTED   Candida parapsilosis NOT DETECTED NOT DETECTED   Candida tropicalis NOT DETECTED NOT DETECTED    [name] 03/15/2017  7:48 AM

## 2017-03-15 NOTE — Progress Notes (Signed)
General Surgery [facility]  Assessment & Plan: Sacral decubitus ulcerations (approx 15x12 cm and 5x3 cm), unstageable due to large dry eschar - Dressing changes with dry gauze for now - Will require operative debridement, extensive, under anesthesia - likely Monday 12/17 Anemia    - post-transfusion Hgb 9.4 Osteomyelitis - IV Zosyn started  No family in room this AM.  Discussed with nurse.  Tentatively plan OR on Monday 12/17 for debridement of sacral decubitus ulcer under anesthesia.  I will discuss with Dr. Kae Heller who will see patient and family in AM 12/17.        Earnstine Regal, MD, [facility]       Office: [phone]    Chief Complaint: Sacral decubitus ulceration, osteomyelitis  Subjective: Patient in bed, comfortable, watching TV.  No family present.  No complaints.  Objective: Vital signs in last 24 hours: Temp:  [98.1 F (36.7 C)-99.2 F (37.3 C)] 99.2 F (37.3 C) (12/16 0602) Pulse Rate:  [84-93] 85 (12/16 0602) Resp:  [18] 18 (12/16 0602) BP: (117-150)/(40-60) 131/40 (12/16 0602) SpO2:  [100 %] 100 % (12/16 0602) Weight:  [72.1 kg (158 lb 15.2 oz)] 72.1 kg (158 lb 15.2 oz) (12/16 6962) Last BM Date: 03/13/17  Intake/Output from previous day: 12/15 0701 - 12/16 0700 In: 926.7 [P.O.:200; Blood:726.7] Out: 950 [Urine:950] Intake/Output this shift: No intake/output data recorded.  Physical Exam: HEENT - sclerae clear, mucous membranes moist Neck - soft Chest - clear bilaterally Cor - RRR Abdomen - soft without distension Ext - sacral dressing dry and intact; slight odor present  Lab Results:  Recent Labs    03/14/17 0555 03/15/17 0602  WBC 11.5* 10.4  HGB 6.5* 9.4*  HCT 21.0* 29.2*  PLT 220 200   BMET Recent Labs    03/14/17 0555 03/15/17 0602  NA 138 137  K 4.2 3.8  CL 107 108  CO2 25 22  GLUCOSE 104* 102*  BUN 24* 16  CREATININE 0.98 0.82  CALCIUM 8.0* 8.1*   PT/INR No  results for input(s): LABPROT, INR in the last 72 hours. Comprehensive Metabolic Panel:    Component Value Date/Time   NA 137 03/15/2017 0602   NA 138 03/14/2017 0555   K 3.8 03/15/2017 0602   K 4.2 03/14/2017 0555   CL 108 03/15/2017 0602   CL 107 03/14/2017 0555   CO2 22 03/15/2017 0602   CO2 25 03/14/2017 0555   BUN 16 03/15/2017 0602   BUN 24 (H) 03/14/2017 0555   CREATININE 0.82 03/15/2017 0602   CREATININE 0.98 03/14/2017 0555   GLUCOSE 102 (H) 03/15/2017 0602   GLUCOSE 104 (H) 03/14/2017 0555   CALCIUM 8.1 (L) 03/15/2017 0602   CALCIUM 8.0 (L) 03/14/2017 0555   AST 21 03/14/2017 0555   AST 36 03/13/2017 2104   ALT 9 (L) 03/14/2017 0555   ALT 11 (L) 03/13/2017 2104   ALKPHOS 49 03/14/2017 0555   ALKPHOS 53 03/13/2017 2104   BILITOT 0.6 03/14/2017 0555   BILITOT 1.3 (H) 03/13/2017 2104   PROT 6.3 (L) 03/14/2017 0555   PROT 7.0 03/13/2017 2104   ALBUMIN 2.0 (L) 03/14/2017 0555   ALBUMIN 2.3 (L) 03/13/2017 2104    Studies/Results: Ct Abdomen Pelvis Wo Contrast  Result Date: 03/13/2017 CLINICAL DATA:  Acute onset of right leg pain. Evaluate known sacral ulceration. EXAM: CT ABDOMEN AND PELVIS WITHOUT CONTRAST TECHNIQUE: Multidetector CT imaging of the abdomen and  pelvis was performed following the standard protocol without IV contrast. COMPARISON:  MRI of the lumbar spine performed 08/09/2012 FINDINGS: Lower chest: There is elevation of the left hemidiaphragm. Scattered coronary artery calcifications are seen. The visualized right lung base is clear. Hepatobiliary: The liver is unremarkable in appearance. Minimal sludge is suggested within the gallbladder. The common bile duct remains normal in caliber. Pancreas: The pancreas is within normal limits. Spleen: The spleen is unremarkable in appearance. Adrenals/Urinary Tract: The adrenal glands are unremarkable in appearance. Mild right-sided hydronephrosis is noted; this may reflect mild mass-effect from the patient's uterine  fibroids. No renal or ureteral stones are identified. No significant perinephric stranding is seen. Stomach/Bowel: The stomach is unremarkable in appearance. The small bowel is within normal limits. The appendix is not visualized; there is no evidence for appendicitis. The colon is unremarkable in appearance. Vascular/Lymphatic: Scattered calcification is seen along the abdominal aorta and its branches. The abdominal aorta is otherwise grossly unremarkable. The inferior vena cava is grossly unremarkable. No retroperitoneal lymphadenopathy is seen. No pelvic sidewall lymphadenopathy is identified. Reproductive: The bladder is significantly distended and grossly unremarkable in appearance. Numerous prominent uterine fibroids are noted, many of which demonstrate calcification. No suspicious adnexal masses are seen. Other: A prominent sacral decubitus ulceration is noted on the left side, extending through the medial aspect of the left gluteal musculature. It measures approximately 9 x 8 cm in size. The pattern of air within the ulceration is somewhat unusual, with largely intact overlying skin. Would correlate clinically to exclude necrotizing fasciitis. There is suggestion of mild erosion at the left side of the sacrum, reflecting osteomyelitis. Stool extends across the anorectal canal, which appears slightly deviated to the right. Musculoskeletal: No acute osseous abnormalities are identified. Mild disc space narrowing is noted at the lower lumbar spine, with endplate sclerotic change and vacuum phenomenon at L4-L5. The visualized musculature is unremarkable in appearance. IMPRESSION: 1. Prominent sacral decubitus ulceration on the left side, extending through the medial aspect of the left gluteal musculature. It measures approximately 9 x 8 cm in size. The pattern of air within the ulceration is somewhat unusual, with largely intact overlying skin. Would correlate clinically to exclude necrotizing fasciitis. 2.  Suggestion of mild erosion at the left-sided sacrum, concerning for underlying osteomyelitis. 3. Mild right-sided hydronephrosis may reflect mild mass-effect from the patient's uterine fibroids. 4. Numerous prominent uterine fibroids, many of which are calcified. 5. Scattered coronary artery calcifications seen. Aortic Atherosclerosis (ICD10-I70.0). These results were called by telephone at the time of interpretation on 03/13/2017 at 11:06 pm to Dr. Addison Lank, who verbally acknowledged these results. Electronically Signed   By: Garald Balding M.D.   On: 03/13/2017 23:08   Dg Foot 2 Views Right  Result Date: 03/13/2017 CLINICAL DATA:  Foot wound along the heel. EXAM: RIGHT FOOT - 2 VIEW COMPARISON:  01/14/2017 FINDINGS: Removal of overlying fiberglass cast since prior. Disuse osteopenia is noted with mild posttraumatic change in malleoli. No new fracture is identified. There is osteoarthritic joint space narrowing of the toes and midfoot articulations. Soft tissue ulceration is noted of the heel without underlying cortical bony destruction of the calcaneus. Small calcaneal enthesophytes are present along the plantar dorsal aspect. IMPRESSION: 1. Soft tissue ulceration of the heel without radiographic evidence of acute osteomyelitis. 2. Disuse osteopenia status post cast removal. Mild posttraumatic deformity from trimalleolar fracture is noted. Electronically Signed   By: Ashley Royalty M.D.   On: 03/13/2017 21:09  Tyre Beaver M 03/15/2017  Patient ID: Julia Floyd, female   DOB: Feb 26, 1931, 83 y.o.   MRN: [id number]

## 2017-03-15 NOTE — Progress Notes (Addendum)
[facility] PROGRESS NOTE  Julia Floyd TKW:[id number] DOB: 08-03-1930 DOA: 03/13/2017  PCP: Josetta Huddle, MD  Brief History/Interval Summary: 83 year old African-American female with a past medical history of stroke, history of intracranial hemorrhage status post TPA, hypertension, diabetes who is largely bedbound and was brought into the hospital as patient was experiencing increasing discharge from her sacral wound.  There was also concern about right heel wound.  Evaluation in this emergency department raised concern for necrotizing fasciitis.  General surgery was consulted.  Reason for Visit: Infected sacral wound and osteomyelitis  Consultants: General surgery  Procedures:   Blood transfusion x 2   Antibiotics: Vancomycin, Zosyn  Subjective/Interval History: Patient states that she is feeling well.  Has occasional pain in her lower back at the site of the wound.  Otherwise denies any other complaints at this time.    ROS: Denies any chest pain, shortness of breath, nausea or vomiting.  Objective:  Vital Signs  Vitals:   03/14/17 1900 03/14/17 2131 03/15/17 0602 03/15/17 0613  BP: (!) 142/48 (!) 136/45 (!) 131/40   Pulse: 84 86 85   Resp: 18 18 18    Temp: 98.9 F (37.2 C) 98.1 F (36.7 C) 99.2 F (37.3 C)   TempSrc: Oral Oral Oral   SpO2: 100% 100% 100%   Weight:    72.1 kg (158 lb 15.2 oz)  Height:        Intake/Output Summary (Last 24 hours) at 03/15/2017 0904 Last data filed at 03/15/2017 0247 Gross per 24 hour  Intake 926.66 ml  Output 950 ml  Net -23.34 ml   Filed Weights   03/14/17 0120 03/14/17 0614 03/15/17 0613  Weight: 68.9 kg (152 lb) 68.9 kg (152 lb) 72.1 kg (158 lb 15.2 oz)    General appearance: Awake alert.  Not distressed. Resp: Clear to auscultation bilaterally Cardio: S1-S2 is normal regular.  No S3-S4.  No rubs murmurs or bruit GI: Abdomen is soft.  Nontender nondistended.  Bowel sounds are present.  No masses  organomegaly Has sacral wound Extremities: She also has heel ulcer on the left.  No drainage noted. Neurologic: Awake alert.  Old left-sided hemiparesis.  Lab Results:  Data Reviewed: I have personally reviewed following labs and imaging studies  CBC: Recent Labs  Lab 03/13/17 2104 03/14/17 0555 03/15/17 0602  WBC 13.6* 11.5* 10.4  NEUTROABS 10.8* 8.7*  --   HGB 7.6* 6.5* 9.4*  HCT 24.0* 21.0* 29.2*  MCV 85.7 87.9 84.4  PLT 262 220 924    Basic Metabolic Panel: Recent Labs  Lab 03/13/17 2104 03/14/17 0555 03/15/17 0602  NA 136 138 137  K 5.4* 4.2 3.8  CL 105 107 108  CO2 24 25 22   GLUCOSE 144* 104* 102*  BUN 27* 24* 16  CREATININE 1.03* 0.98 0.82  CALCIUM 8.5* 8.0* 8.1*    GFR: Estimated Creatinine Clearance: 51.9 mL/min (by C-G formula based on SCr of 0.82 mg/dL).  Liver Function Tests: Recent Labs  Lab 03/13/17 2104 03/14/17 0555  AST 36 21  ALT 11* 9*  ALKPHOS 53 49  BILITOT 1.3* 0.6  PROT 7.0 6.3*  ALBUMIN 2.3* 2.0*    CBG: Recent Labs  Lab 03/14/17 0835 03/14/17 1150 03/14/17 2127 03/14/17 2204 03/15/17 0732  GLUCAP 99 90 73 80 121*   Results for orders placed or performed during the hospital encounter of 03/13/17  Blood culture (routine x 2)     Status: None (Preliminary result)   Collection Time: 03/13/17 11:56  PM  Result Value Ref Range Status   Specimen Description BLOOD RIGHT HAND  Final   Special Requests IN PEDIATRIC BOTTLE Blood Culture adequate volume  Final   Culture  Setup Time   Final    GRAM POSITIVE COCCI IN CLUSTERS IN PEDIATRIC BOTTLE CRITICAL RESULT CALLED TO, READ BACK BY AND VERIFIED WITH: E JACKSON,PHARMD AT 9629 03/15/17 BY L BENFIELD Performed at [facility], [address], [place], [place] [address]    Culture GRAM POSITIVE COCCI  Final   Report Status PENDING  Incomplete  Blood Culture ID Panel (Reflexed)     Status: Abnormal   Collection Time: 03/13/17 11:56 PM  Result Value Ref Range Status    Enterococcus species NOT DETECTED NOT DETECTED Final   Listeria monocytogenes NOT DETECTED NOT DETECTED Final   Staphylococcus species DETECTED (A) NOT DETECTED Final    Comment: Methicillin (oxacillin) resistant coagulase negative staphylococcus. Possible blood culture contaminant (unless isolated from more than one blood culture draw or clinical case suggests pathogenicity). No antibiotic treatment is indicated for blood  culture contaminants. CRITICAL RESULT CALLED TO, READ BACK BY AND VERIFIED WITH: E JACKSON,PHARMD AT 3244 03/15/17 BY L BENFIELD    Staphylococcus aureus NOT DETECTED NOT DETECTED Final   Methicillin resistance DETECTED (A) NOT DETECTED Final    Comment: CRITICAL RESULT CALLED TO, READ BACK BY AND VERIFIED WITH: E JACKSON,PHARMD AT 0714 03/15/17 BY L BENFIELD    Streptococcus species NOT DETECTED NOT DETECTED Final   Streptococcus agalactiae NOT DETECTED NOT DETECTED Final   Streptococcus pneumoniae NOT DETECTED NOT DETECTED Final   Streptococcus pyogenes NOT DETECTED NOT DETECTED Final   Acinetobacter baumannii NOT DETECTED NOT DETECTED Final   Enterobacteriaceae species NOT DETECTED NOT DETECTED Final   Enterobacter cloacae complex NOT DETECTED NOT DETECTED Final   Escherichia coli NOT DETECTED NOT DETECTED Final   Klebsiella oxytoca NOT DETECTED NOT DETECTED Final   Klebsiella pneumoniae NOT DETECTED NOT DETECTED Final   Proteus species NOT DETECTED NOT DETECTED Final   Serratia marcescens NOT DETECTED NOT DETECTED Final   Haemophilus influenzae NOT DETECTED NOT DETECTED Final   Neisseria meningitidis NOT DETECTED NOT DETECTED Final   Pseudomonas aeruginosa NOT DETECTED NOT DETECTED Final   Candida albicans NOT DETECTED NOT DETECTED Final   Candida glabrata NOT DETECTED NOT DETECTED Final   Candida krusei NOT DETECTED NOT DETECTED Final   Candida parapsilosis NOT DETECTED NOT DETECTED Final   Candida tropicalis NOT DETECTED NOT DETECTED Final    Comment:  Performed at [facility], [place]. [address] [place], [place] [address]     Radiology Studies: Ct Abdomen Pelvis Wo Contrast  Result Date: 03/13/2017 CLINICAL DATA:  Acute onset of right leg pain. Evaluate known sacral ulceration. EXAM: CT ABDOMEN AND PELVIS WITHOUT CONTRAST TECHNIQUE: Multidetector CT imaging of the abdomen and pelvis was performed following the standard protocol without IV contrast. COMPARISON:  MRI of the lumbar spine performed 08/09/2012 FINDINGS: Lower chest: There is elevation of the left hemidiaphragm. Scattered coronary artery calcifications are seen. The visualized right lung base is clear. Hepatobiliary: The liver is unremarkable in appearance. Minimal sludge is suggested within the gallbladder. The common bile duct remains normal in caliber. Pancreas: The pancreas is within normal limits. Spleen: The spleen is unremarkable in appearance. Adrenals/Urinary Tract: The adrenal glands are unremarkable in appearance. Mild right-sided hydronephrosis is noted; this may reflect mild mass-effect from the patient's uterine fibroids. No renal or ureteral stones are identified. No significant  perinephric stranding is seen. Stomach/Bowel: The stomach is unremarkable in appearance. The small bowel is within normal limits. The appendix is not visualized; there is no evidence for appendicitis. The colon is unremarkable in appearance. Vascular/Lymphatic: Scattered calcification is seen along the abdominal aorta and its branches. The abdominal aorta is otherwise grossly unremarkable. The inferior vena cava is grossly unremarkable. No retroperitoneal lymphadenopathy is seen. No pelvic sidewall lymphadenopathy is identified. Reproductive: The bladder is significantly distended and grossly unremarkable in appearance. Numerous prominent uterine fibroids are noted, many of which demonstrate calcification. No suspicious adnexal masses are seen. Other: A prominent sacral decubitus ulceration is noted  on the left side, extending through the medial aspect of the left gluteal musculature. It measures approximately 9 x 8 cm in size. The pattern of air within the ulceration is somewhat unusual, with largely intact overlying skin. Would correlate clinically to exclude necrotizing fasciitis. There is suggestion of mild erosion at the left side of the sacrum, reflecting osteomyelitis. Stool extends across the anorectal canal, which appears slightly deviated to the right. Musculoskeletal: No acute osseous abnormalities are identified. Mild disc space narrowing is noted at the lower lumbar spine, with endplate sclerotic change and vacuum phenomenon at L4-L5. The visualized musculature is unremarkable in appearance. IMPRESSION: 1. Prominent sacral decubitus ulceration on the left side, extending through the medial aspect of the left gluteal musculature. It measures approximately 9 x 8 cm in size. The pattern of air within the ulceration is somewhat unusual, with largely intact overlying skin. Would correlate clinically to exclude necrotizing fasciitis. 2. Suggestion of mild erosion at the left-sided sacrum, concerning for underlying osteomyelitis. 3. Mild right-sided hydronephrosis may reflect mild mass-effect from the patient's uterine fibroids. 4. Numerous prominent uterine fibroids, many of which are calcified. 5. Scattered coronary artery calcifications seen. Aortic Atherosclerosis (ICD10-I70.0). These results were called by telephone at the time of interpretation on 03/13/2017 at 11:06 pm to Dr. Addison Lank, who verbally acknowledged these results. Electronically Signed   By: Garald Balding M.D.   On: 03/13/2017 23:08   Dg Foot 2 Views Right  Result Date: 03/13/2017 CLINICAL DATA:  Foot wound along the heel. EXAM: RIGHT FOOT - 2 VIEW COMPARISON:  01/14/2017 FINDINGS: Removal of overlying fiberglass cast since prior. Disuse osteopenia is noted with mild posttraumatic change in malleoli. No new fracture is  identified. There is osteoarthritic joint space narrowing of the toes and midfoot articulations. Soft tissue ulceration is noted of the heel without underlying cortical bony destruction of the calcaneus. Small calcaneal enthesophytes are present along the plantar dorsal aspect. IMPRESSION: 1. Soft tissue ulceration of the heel without radiographic evidence of acute osteomyelitis. 2. Disuse osteopenia status post cast removal. Mild posttraumatic deformity from trimalleolar fracture is noted. Electronically Signed   By: Ashley Royalty M.D.   On: 03/13/2017 21:09     Medications:  Scheduled: . insulin aspart  0-9 Units Subcutaneous TID WC   Continuous: . piperacillin-tazobactam (ZOSYN)  IV 3.375 g (03/15/17 0525)  . vancomycin Stopped (03/15/17 0525)   GQQ:[id number] **OR** acetaminophen, ondansetron **OR** ondansetron (ZOFRAN) IV  Assessment/Plan:  Active Problems:   HTN (hypertension)   Stroke (cerebrum) (HCC) -  Suspect stroke/TIA s/p tPA with multifocal ICH - etiology unclear, concerning for underlying CAA   Benign essential HTN   Diabetes mellitus type 2 in nonobese (HCC)   Wound infection   Skin ulcer of sacrum with necrosis of muscle (HCC)   Normochromic normocytic anemia    Infected sacral wound with possible  necrotizing fasciitis/sacral osteomyelitis No evidence for sepsis at this time.  Lactic acid level was normal.  Patient was started on vancomycin and Zosyn and clindamycin.  Currently on vancomycin and Zosyn.  Follow-up on cultures.  One set is positive for gram-positive cocci.  General surgery is following with plans for surgical debridement tomorrow.  Will eventually need to consult infectious disease.   Normocytic anemia Baseline about 10.  Stool for occult blood was negative.  Hemoglobin dropped to 6 and patient was transfused 2 units of blood.  Hemoglobin has responded.  Unfortunately anemia panel was not sent.   Diabetes mellitus type 2 Continue sliding scale insulin  coverage.  Monitor CBGs.  History of essential hypertension Blood pressures were borderline low initially.  Blood pressures appear to have stabilized.  Continue to monitor closely.    Ulcer involving left heel/possible peripheral artery disease No drainage noted from the heel ulcer.  Wound care will be consulted.  Patient was supposed to see vascular surgery as an outpatient.  Could consider doing arterial Dopplers.  Previous history of stroke status post TPA complicated by intracranial hemorrhage Currently stable.  Not a candidate for antiplatelet agents or anticoagulation as per neurology previously.  Mild acute renal failure Appears to be resolved with IV fluids.  Multiple uterine fibroids possibly causing mild left-sided hydronephrosis Incidentally noted on CT scan.  Will need outpatient management.  DVT Prophylaxis: SCDs for now    Code Status: Full code Family Communication: Discussed with the patient.  No family at bedside today. Disposition Plan: Continue management as outlined above.    LOS: 2 days   [facility] Pager [phone] 03/15/2017, 9:04 AM  If 7PM-7AM, please contact night-coverage at [email or web address], password [facility]

## 2017-03-15 NOTE — Consult Note (Addendum)
[facility] Nurse wound consult note Reason for Consult: Right heel Unstageable pressure injury; stable dry black eschar. Small area of eschar on the anterior right foot. Wound type: ischemia, pressure Pressure Injury POA: Yes Measurement: 7cm round dry, black eschar 2.5cm x 1.5cm area of dry, black eschar on the anterior surface of foot. Wound bed:See above Drainage (amount, consistency, odor) None Periwound: Dry skin, peeling Dressing procedure/placement/frequency: I have provided Nursing with guidance via the orders for application of betadine swabstick to the affected heel and anterior foot lesions.  Once air-dried, Nursing to place feet into Prevalon Boots (pressure redistribution heel boots).  Note:  Patient is scheduled for surgical debridement of the sacral ulcer by [facility] MD tomorrow.  [facility] nursing team will not follow, but will remain available to this patient, the nursing and medical teams.  Please re-consult if needed. Thanks, Maudie Flakes, MSN, RN, [place], Arther Abbott  Pager# [phone]

## 2017-03-15 NOTE — Progress Notes (Signed)
Initial Nutrition Assessment  INTERVENTION:   Provide Ensure Enlive po BID, each supplement provides 350 kcal and 20 grams of protein  RD will continue to monitor  NUTRITION DIAGNOSIS:   Increased nutrient needs related to wound healing as evidenced by estimated needs.  GOAL:   Patient will meet greater than or equal to 90% of their needs  MONITOR:   PO intake, Supplement acceptance, Weight trends, Labs, I & O's  REASON FOR ASSESSMENT:   Malnutrition Screening Tool    ASSESSMENT:   83 year old African-American female with a past medical history of stroke, history of intracranial hemorrhage status post TPA, hypertension, diabetes who is largely bedbound and was brought into the hospital as patient was experiencing increasing discharge from her sacral wound.  There was also concern about right heel wound.  Evaluation in this emergency department raised concern for necrotizing fasciitis.  Patient currently consuming 25-50% of meals. Pt is s/p stroke. H/o dementia as well. Per surgery note, plan is for wound debridement on 12/17. Will order ensure supplements to aid in wound healing. Pt reports weight loss following breaking an ankle in October 2018. Pt has lost 12 lb since 4/11 (7% wt loss x 8 months, insignificant for time frame). Pt is bedbound.   Labs reviewed. Medications reviewed.  NUTRITION - FOCUSED PHYSICAL EXAM:  Nutrition focused physical exam shows no sign of depletion of muscle mass or body fat.  Diet Order:  DIET SOFT Room service appropriate? Yes; Fluid consistency: Thin  EDUCATION NEEDS:   Not appropriate for education at this time  Skin:  Skin Assessment: Skin Integrity Issues: Skin Integrity Issues:: Unstageable, Other (Comment) Unstageable: left heel ulcer Other: sacral wound  Last BM:  12/14  Height:   Ht Readings from Last 1 Encounters:  03/14/17 5\' 6"  (1.676 m)    Weight:   Wt Readings from Last 1 Encounters:  03/15/17 158 lb 15.2 oz (72.1  kg)    Ideal Body Weight:  59.1 kg  BMI:  Body mass index is 25.66 kg/m.  Estimated Nutritional Needs:   Kcal:  1900-2100  Protein:  90-100g  Fluid:  2L/day  Clayton Bibles, MS, RD, LDN [facility] Pager: [phone] After Hours Pager: [phone]

## 2017-03-16 ENCOUNTER — Encounter (HOSPITAL_COMMUNITY): Payer: Self-pay

## 2017-03-16 ENCOUNTER — Inpatient Hospital Stay (HOSPITAL_COMMUNITY): Payer: Medicare Other | Admitting: Anesthesiology

## 2017-03-16 ENCOUNTER — Encounter (HOSPITAL_COMMUNITY)
Admission: EM | Disposition: A | Discharge status: Skilled Nursing Facility | Payer: Self-pay | Source: Home / Self Care | Attending: Family Medicine

## 2017-03-16 HISTORY — PX: IRRIGATION AND DEBRIDEMENT ABSCESS: SHX5252

## 2017-03-16 LAB — CBC
HEMATOCRIT: 31.1 % — AB (ref 36.0–46.0)
Hemoglobin: 9.8 g/dL — ABNORMAL LOW (ref 12.0–15.0)
MCH: 26.6 pg (ref 26.0–34.0)
MCHC: 31.5 g/dL (ref 30.0–36.0)
MCV: 84.3 fL (ref 78.0–100.0)
PLATELETS: 196 10*3/uL (ref 150–400)
RBC: 3.69 MIL/uL — ABNORMAL LOW (ref 3.87–5.11)
RDW: 16.9 % — AB (ref 11.5–15.5)
WBC: 9.7 10*3/uL (ref 4.0–10.5)

## 2017-03-16 LAB — BASIC METABOLIC PANEL
ANION GAP: 5 (ref 5–15)
BUN: 16 mg/dL (ref 6–20)
CALCIUM: 8.3 mg/dL — AB (ref 8.9–10.3)
CO2: 24 mmol/L (ref 22–32)
CREATININE: 0.92 mg/dL (ref 0.44–1.00)
Chloride: 109 mmol/L (ref 101–111)
GFR calc Af Amer: >60 mL/min (ref >60)
GFR, EST NON AFRICAN AMERICAN: 56 mL/min — AB (ref >60)
GLUCOSE: 98 mg/dL (ref 65–99)
Potassium: 3.7 mmol/L (ref 3.5–5.1)
Sodium: 138 mmol/L (ref 135–145)

## 2017-03-16 LAB — GLUCOSE, CAPILLARY
GLUCOSE-CAPILLARY: 112 mg/dL — AB (ref 65–99)
Glucose-Capillary: 77 mg/dL (ref 65–99)
Glucose-Capillary: 69 mg/dL (ref 65–99)
Glucose-Capillary: 70 mg/dL (ref 65–99)
Glucose-Capillary: 80 mg/dL (ref 65–99)

## 2017-03-16 LAB — MRSA PCR SCREENING: MRSA by PCR: NEGATIVE

## 2017-03-16 SURGERY — IRRIGATION AND DEBRIDEMENT ABSCESS
Anesthesia: General

## 2017-03-16 MED ORDER — LACTATED RINGERS IV SOLN
INTRAVENOUS | Status: DC
  Administered 2017-03-16: 1000 mL via INTRAVENOUS

## 2017-03-16 MED ORDER — SODIUM CHLORIDE 0.9 % IV SOLN: INTRAVENOUS | Status: DC

## 2017-03-16 MED ORDER — FENTANYL CITRATE (PF) 100 MCG/2ML IJ SOLN
INTRAMUSCULAR | Status: AC
  Administered 2017-03-16: 25 ug
  Filled 2017-03-16: qty 2

## 2017-03-16 MED ORDER — FENTANYL CITRATE (PF) 100 MCG/2ML IJ SOLN
INTRAMUSCULAR | Status: DC | PRN
  Administered 2017-03-16 (×2): 25 ug via INTRAVENOUS

## 2017-03-16 MED ORDER — DEXAMETHASONE SODIUM PHOSPHATE 10 MG/ML IJ SOLN
INTRAMUSCULAR | Status: AC
  Filled 2017-03-16: qty 1

## 2017-03-16 MED ORDER — BOOST / RESOURCE BREEZE PO LIQD CUSTOM
1.0000 | Freq: Three times a day (TID) | ORAL | Status: DC
  Administered 2017-03-16 – 2017-03-18 (×2): 1 via ORAL

## 2017-03-16 MED ORDER — CHLORHEXIDINE GLUCONATE CLOTH 2 % EX PADS
6.0000 | MEDICATED_PAD | Freq: Once | CUTANEOUS | Status: AC
  Administered 2017-03-16: 6 via TOPICAL

## 2017-03-16 MED ORDER — DEXTROSE 50 % IV SOLN
INTRAVENOUS | Status: AC
  Filled 2017-03-16: qty 50

## 2017-03-16 MED ORDER — ONDANSETRON HCL 4 MG/2ML IJ SOLN
INTRAMUSCULAR | Status: AC
  Filled 2017-03-16: qty 2

## 2017-03-16 MED ORDER — PHENYLEPHRINE HCL 10 MG/ML IJ SOLN
INTRAMUSCULAR | Status: DC | PRN
  Administered 2017-03-16 (×4): 120 ug via INTRAVENOUS

## 2017-03-16 MED ORDER — 0.9 % SODIUM CHLORIDE (POUR BTL) OPTIME
TOPICAL | Status: DC | PRN
  Administered 2017-03-16: 1000 mL

## 2017-03-16 MED ORDER — SUGAMMADEX SODIUM 200 MG/2ML IV SOLN
INTRAVENOUS | Status: DC | PRN
  Administered 2017-03-16: 200 mg via INTRAVENOUS

## 2017-03-16 MED ORDER — ONDANSETRON HCL 4 MG/2ML IJ SOLN: 4.0000 mg | Freq: Once | INTRAMUSCULAR | Status: DC | PRN

## 2017-03-16 MED ORDER — PHENYLEPHRINE HCL 10 MG/ML IJ SOLN
INTRAVENOUS | Status: DC | PRN
  Administered 2017-03-16: 25 ug/min via INTRAVENOUS

## 2017-03-16 MED ORDER — ROCURONIUM BROMIDE 10 MG/ML (PF) SYRINGE
PREFILLED_SYRINGE | INTRAVENOUS | Status: DC | PRN
  Administered 2017-03-16: 50 mg via INTRAVENOUS

## 2017-03-16 MED ORDER — CHLORHEXIDINE GLUCONATE CLOTH 2 % EX PADS: 6.0000 | MEDICATED_PAD | Freq: Once | CUTANEOUS | Status: DC

## 2017-03-16 MED ORDER — FENTANYL CITRATE (PF) 100 MCG/2ML IJ SOLN
INTRAMUSCULAR | Status: AC
  Filled 2017-03-16: qty 2

## 2017-03-16 MED ORDER — DEXTROSE 50 % IV SOLN
12.5000 g | Freq: Once | INTRAVENOUS | Status: AC
  Administered 2017-03-16: 12.5 g via INTRAVENOUS

## 2017-03-16 MED ORDER — FENTANYL CITRATE (PF) 100 MCG/2ML IJ SOLN: 25.0000 ug | INTRAMUSCULAR | Status: DC | PRN

## 2017-03-16 MED ORDER — PROPOFOL 10 MG/ML IV BOLUS
INTRAVENOUS | Status: DC | PRN
Start: 2017-03-16 — End: 2017-03-16
  Administered 2017-03-16: 90 mg via INTRAVENOUS

## 2017-03-16 MED ORDER — FENTANYL CITRATE (PF) 100 MCG/2ML IJ SOLN
25.0000 ug | INTRAMUSCULAR | Status: DC | PRN
Start: 2017-03-16 — End: 2017-03-16

## 2017-03-16 MED ORDER — PIPERACILLIN-TAZOBACTAM 3.375 G IVPB
INTRAVENOUS | Status: AC
  Filled 2017-03-16: qty 50

## 2017-03-16 SURGICAL SUPPLY — 20 items
BNDG CONFORM 6X.82 1P STRL (GAUZE/BANDAGES/DRESSINGS) ×3 IMPLANT
BNDG GAUZE ELAST 4 BULKY (GAUZE/BANDAGES/DRESSINGS) ×3 IMPLANT
DRAPE LAPAROSCOPIC ABDOMINAL (DRAPES) ×3 IMPLANT
DRAPE SHEET LG 3/4 BI-LAMINATE (DRAPES) ×3 IMPLANT
DRSG PAD ABDOMINAL 8X10 ST (GAUZE/BANDAGES/DRESSINGS) ×3 IMPLANT
ELECT REM PT RETURN 15FT ADLT (MISCELLANEOUS) ×3 IMPLANT
GAUZE PACKING 1 X5 YD ST (GAUZE/BANDAGES/DRESSINGS) IMPLANT
GAUZE PACKING 1/2X5YD (GAUZE/BANDAGES/DRESSINGS) IMPLANT
GAUZE PACKING 2X5 YD STRL (GAUZE/BANDAGES/DRESSINGS) IMPLANT
GAUZE SPONGE 4X4 12PLY STRL (GAUZE/BANDAGES/DRESSINGS) ×3 IMPLANT
GLOVE ECLIPSE 8.0 STRL XLNG CF (GLOVE) ×3 IMPLANT
GLOVE INDICATOR 8.0 STRL GRN (GLOVE) ×3 IMPLANT
GOWN STRL REUS W/TWL XL LVL3 (GOWN DISPOSABLE) ×6 IMPLANT
KIT BASIN OR (CUSTOM PROCEDURE TRAY) ×3 IMPLANT
PACK GENERAL/GYN (CUSTOM PROCEDURE TRAY) ×3 IMPLANT
PAD ABD 8X10 STRL (GAUZE/BANDAGES/DRESSINGS) ×3 IMPLANT
SWAB COLLECTION DEVICE MRSA (MISCELLANEOUS) IMPLANT
SWAB CULTURE ESWAB REG 1ML (MISCELLANEOUS) IMPLANT
TOWEL OR 17X26 10 PK STRL BLUE (TOWEL DISPOSABLE) ×3 IMPLANT
TOWEL OR NON WOVEN STRL DISP B (DISPOSABLE) ×3 IMPLANT

## 2017-03-16 NOTE — Progress Notes (Signed)
Hypoglycemic Event  CBG: 70  Treatment: D50 IV 25 mL  Symptoms: None  Follow-up CBG: Time:1713 CBG Result:112  Possible Reasons for Event: Other: NPO for surgery  Comments/MD notified: Dr Marcell Barlow, anesthesia

## 2017-03-16 NOTE — Progress Notes (Signed)
[facility] Progress Note  Day of Surgery  Subjective: CC: sacral ulcers Patient states she has occasional pain but is not hurting now. Denies chest pain, SOB, n/v. Daughter at bedside.  Spoke with patient and daughter about operative debridement and answered questions.  VSS.  Objective: Vital signs in last 24 hours: Temp:  [97.5 F (36.4 C)-98.5 F (36.9 C)] 98.4 F (36.9 C) (12/17 0622) Pulse Rate:  [80-94] 85 (12/17 0622) Resp:  [18] 18 (12/17 0622) BP: (118-140)/(54-59) 118/54 (12/17 0622) SpO2:  [100 %] 100 % (12/17 0622) Weight:  [76.5 kg (168 lb 10.4 oz)] 76.5 kg (168 lb 10.4 oz) (12/17 0622) Last BM Date: 03/15/17  Intake/Output from previous day: 12/16 0701 - 12/17 0700 In: 1000 [P.O.:450; I.V.:300; IV Piggyback:250] Out: 600 [Urine:600] Intake/Output this shift: No intake/output data recorded.  PE: Gen:  Resting comfortably, pleasant Card:  Regular rate and rhythm Pulm:  Normal effort, clear to auscultation bilaterally Abd: Soft, non-tender, non-distended, bowel sounds present, no HSM Skin: warm and dry, no rashes; sacral dressings intact with minimal drainage present, wounds with eschar present  Lab Results:  Recent Labs    03/15/17 0602 03/16/17 0519  WBC 10.4 9.7  HGB 9.4* 9.8*  HCT 29.2* 31.1*  PLT 200 196   BMET Recent Labs    03/15/17 0602 03/16/17 0519  NA 137 138  K 3.8 3.7  CL 108 109  CO2 22 24  GLUCOSE 102* 98  BUN 16 16  CREATININE 0.82 0.92  CALCIUM 8.1* 8.3*   PT/INR No results for input(s): LABPROT, INR in the last 72 hours. CMP     Component Value Date/Time   NA 138 03/16/2017 0519   K 3.7 03/16/2017 0519   CL 109 03/16/2017 0519   CO2 24 03/16/2017 0519   GLUCOSE 98 03/16/2017 0519   BUN 16 03/16/2017 0519   CREATININE 0.92 03/16/2017 0519   CALCIUM 8.3 (L) 03/16/2017 0519   PROT 6.3 (L) 03/14/2017 0555   ALBUMIN 2.0 (L) 03/14/2017 0555   AST 21 03/14/2017 0555   ALT 9 (L) 03/14/2017 0555   ALKPHOS  49 03/14/2017 0555   BILITOT 0.6 03/14/2017 0555   GFRNONAA 56 (L) 03/16/2017 0519   GFRAA >60 03/16/2017 0519   Lipase  No results found for: LIPASE     Studies/Results: No results found.  Anti-infectives: Anti-infectives (From admission, onward)   Start     Dose/Rate Route Frequency Ordered Stop   03/15/17 0400  vancomycin (VANCOCIN) IVPB 750 mg/150 ml premix     750 mg 150 mL/hr over 60 Minutes Intravenous Every 24 hours 03/14/17 0111     03/14/17 0800  clindamycin (CLEOCIN) IVPB 900 mg  Status:  Discontinued     900 mg 100 mL/hr over 30 Minutes Intravenous Every 8 hours 03/14/17 0216 03/14/17 1407   03/14/17 0600  piperacillin-tazobactam (ZOSYN) IVPB 3.375 g     3.375 g 12.5 mL/hr over 240 Minutes Intravenous Every 8 hours 03/14/17 0247     03/13/17 2345  vancomycin (VANCOCIN) 1,500 mg in sodium chloride 0.9 % 500 mL IVPB     1,500 mg 250 mL/hr over 120 Minutes Intravenous  Once 03/13/17 2332 03/14/17 0305   03/13/17 2330  clindamycin (CLEOCIN) IVPB 600 mg     600 mg 100 mL/hr over 30 Minutes Intravenous  Once 03/13/17 2327 03/14/17 0045   03/13/17 2330  piperacillin-tazobactam (ZOSYN) IVPB 3.375 g     3.375 g 100 mL/hr over 30 Minutes Intravenous  Once 03/13/17 2327 03/14/17 0045       Assessment/Plan Normocytic anemia - hgb 9.8 this AM HTN T2 Diabetes Mellitus Left heal ulcer/PAD Hx of stroke - stable Multiple uterine fibroids  Sacral decubitus ulcerations, unstageable  - 15x12 cm and 5x3 cm - WBC 9.7, afebrile - to OR today for operative debridement under anesthesia Osteomyelitis - IV zosyn and vanc  FEN: NPO, IVF VTE: SCDs ID: IV vanc and zosyn 12/14>>, clindamycin 12/14>12/15   LOS: 3 days    Brigid Re , [facility] 03/16/2017, 10:03 AM Pager: [phone] Consults: [phone] Mon-Fri 7:00 am-4:30 pm Sat-Sun 7:00 am-11:30 am

## 2017-03-16 NOTE — Progress Notes (Signed)
Pharmacy Antibiotic Note  Julia Floyd is a 83 y.o. female admitted on 03/13/2017 with Sacral decubitus ulceration, osteomyelitis.  Pharmacy has been consulted for vancomycin and zosyn dosing. 03/16/2017 WBC WNL, AF, Cr 0.92. To OR today for I&D of sacral decub ulcer.  1 of 2  BC MRSE - possible contaminant.   Plan: continue vancomycin 750 mg IV q24 for est AUC 443 Zosyn 3.375g IV q8h (4 hour infusion).  Daily Scr Vancomycin levels if long-term vanc planned  Height: 5\' 6"  (167.6 cm) Weight: 168 lb 10.4 oz (76.5 kg) IBW/kg (Calculated) : 59.3  Temp (24hrs), Avg:98.1 F (36.7 C), Min:97.5 F (36.4 C), Max:98.5 F (36.9 C)  Recent Labs  Lab 03/13/17 2104 03/13/17 2123 03/14/17 0555 03/15/17 0602 03/16/17 0519  WBC 13.6*  --  11.5* 10.4 9.7  CREATININE 1.03*  --  0.98 0.82 0.92  LATICACIDVEN  --  1.47  --   --   --     Estimated Creatinine Clearance: 47.6 mL/min (by C-G formula based on SCr of 0.92 mg/dL).    No Known Allergies  Antimicrobials this admission: 12/14 vancomycin >>  12/14 zosyn >> 12/15 clindamycin >>12/12  Dose adjustments this admission:   Microbiology results: 12/14 BCx x2: 1/2 GPC in clusters (BCID= CoNS- MR)- contaminant?   Thank you for allowing pharmacy to be a part of this patient's care.  Eudelia Bunch, Pharm.D. [phone] 03/16/2017 8:51 AM

## 2017-03-16 NOTE — Op Note (Signed)
Operative Note  KAMILLA HANDS  [id number]  [id number]  03/16/2017   Surgeon: Vikki Ports A ConnorMD  Assistant: OR staff  Procedure performed: Incision and debridement of sacral stage IV decubitus (15x20x3cm including subcutaneous, fascia, muscle and periosteum) and left upper posterior thigh full thickness wound (3x2x2cm, subcutaneous)  Preop diagnosis: Sacral decubiti Post-op diagnosis/intraop findings: same  Specimens: no Retained items: kerlix packing to be changed tomorrow EBL: minimal cc Complications: none  Description of procedure: After obtaining informed consent the patient was taken to the operating room wheregeneral endotracheal anesthesia was initiated, preoperative antibiotics were administered, SCDs applied, and a formal timeout was performed. She was placed prone with all pressure points appropriately padded. The eschar over the large left side sacral decubitus was excised sharply and necrotic tissue beneath it was excised sharply until bleeding tissue was encountered. There was granulation already present at the inferior aspect of the wound however superiorly the necrotic tissue extended well into the musculature. I stopped debridement at the periosteum medially and there is remaining necrotic muscle at the superior/lateral aspect of the wound. Then turned to unstageable wound on left upper thigh where the eschar was excised with cautery and the wound was demonstrated to be full thickness. Hemostasis was achieved in both wounds with cautery. Both were packed with saline moistened kerlix followed by dry dressings. The patient was then returned supine, awakened, extubated and taken to PACU in stable condition.   All counts were correct at the completion of the case.

## 2017-03-16 NOTE — Anesthesia Postprocedure Evaluation (Signed)
Anesthesia Post Note  Patient: Julia Floyd  Procedure(s) Performed: IRRIGATION AND DEBRIDEMENT ABSCESS (N/A )     Patient location during evaluation: PACU Anesthesia Type: General Level of consciousness: awake and alert Pain management: pain level controlled Vital Signs Assessment: post-procedure vital signs reviewed and stable Respiratory status: spontaneous breathing, nonlabored ventilation, respiratory function stable and patient connected to nasal cannula oxygen Cardiovascular status: blood pressure returned to baseline and stable Postop Assessment: no apparent nausea or vomiting Anesthetic complications: no    Last Vitals:  Vitals:   03/16/17 1912 03/16/17 2231  BP: (!) 125/56 (!) 112/39  Pulse: 87 90  Resp: 16 16  Temp: 36.6 C 36.9 C  SpO2: 100% 100%    Last Pain:  Vitals:   03/16/17 2231  TempSrc: Oral  PainSc:                  Montez Hageman

## 2017-03-16 NOTE — Anesthesia Procedure Notes (Signed)
Procedure Name: Intubation Date/Time: 03/16/2017 3:34 PM Performed by: Anne Fu, CRNA Pre-anesthesia Checklist: Patient identified, Emergency Drugs available, Suction available and Patient being monitored Patient Re-evaluated:Patient Re-evaluated prior to induction Oxygen Delivery Method: Circle system utilized Preoxygenation: Pre-oxygenation with 100% oxygen Induction Type: IV induction Ventilation: Mask ventilation without difficulty Laryngoscope Size: Mac and 3 Grade View: Grade I Tube type: Oral Tube size: 7.0 mm Number of attempts: 1 Airway Equipment and Method: Stylet Placement Confirmation: ETT inserted through vocal cords under direct vision,  positive ETCO2 and breath sounds checked- equal and bilateral Secured at: 21 cm Tube secured with: Tape Dental Injury: Teeth and Oropharynx as per pre-operative assessment

## 2017-03-16 NOTE — Anesthesia Preprocedure Evaluation (Addendum)
Anesthesia Evaluation  Patient identified by MRN, date of birth, ID band Patient awake    Reviewed: Allergy & Precautions, NPO status , Patient's Chart, lab work & pertinent test results  Airway Mallampati: III  TM Distance: >3 FB Neck ROM: Full    Dental  (+) Edentulous Lower, Edentulous Upper   Pulmonary neg pulmonary ROS, former smoker,    Pulmonary exam normal breath sounds clear to auscultation       Cardiovascular hypertension, Pt. on medications Normal cardiovascular exam Rhythm:Regular Rate:Normal  ECG: ST, rate 105  ECHO:  Left ventricle: The cavity size was normal. Wall thickness was increased in a pattern of mild LVH. Systolic function was normal. The estimated ejection fraction was in the range of 60% to 65%. Wall motion was normal; there were no regional wall motion abnormalities. Doppler parameters are consistent with abnormal left ventricular relaxation (grade 1 diastolic dysfunction). Mitral valve: Nodular thickening of the anterior leaflet. Calcified annulus. Left atrium: The atrium was mildly dilated. Atrial septum: No defect or patent foramen ovale was identified. Pulmonary arteries: PA peak pressure: 43 mm Hg (S).   Neuro/Psych PSYCHIATRIC DISORDERS Depression Bipolar Disorder Schizophrenia Dementia Alzheimers CVA, Residual Symptoms    GI/Hepatic negative GI ROS, Neg liver ROS,   Endo/Other  diabetes, Oral Hypoglycemic Agents  Renal/GU negative Renal ROS     Musculoskeletal negative musculoskeletal ROS (+)   Abdominal   Peds  Hematology  (+) anemia , HLD   Anesthesia Other Findings Sacral ulcer Wheelchair bound  Reproductive/Obstetrics                            Anesthesia Physical Anesthesia Plan  ASA: III  Anesthesia Plan: General   Post-op Pain Management:    Induction: Intravenous  PONV Risk Score and Plan: 3 and Dexamethasone, Ondansetron and Treatment  may vary due to age or medical condition  Airway Management Planned: Oral ETT  Additional Equipment:   Intra-op Plan:   Post-operative Plan: Extubation in OR  Informed Consent: I have reviewed the patients History and Physical, chart, labs and discussed the procedure including the risks, benefits and alternatives for the proposed anesthesia with the patient or authorized representative who has indicated his/her understanding and acceptance.   Dental advisory given  Plan Discussed with: CRNA  Anesthesia Plan Comments:        Anesthesia Quick Evaluation

## 2017-03-16 NOTE — Transfer of Care (Signed)
Immediate Anesthesia Transfer of Care Note  Patient: Julia Floyd  Procedure(s) Performed: Procedure(s): IRRIGATION AND DEBRIDEMENT ABSCESS (N/A)  Patient Location: PACU  Anesthesia Type:General  Level of Consciousness:  sedated, patient cooperative and responds to stimulation  Airway & Oxygen Therapy:Patient Spontanous Breathing and Patient connected to face mask oxgen  Post-op Assessment:  Report given to PACU RN and Post -op Vital signs reviewed and stable  Post vital signs:  Reviewed and stable  Last Vitals:  Vitals:   03/15/17 2112 03/16/17 0622  BP: (!) 140/59 (!) 118/54  Pulse: 94 85  Resp: 18 18  Temp: 36.9 C 36.9 C  SpO2: 492% 010%    Complications: No apparent anesthesia complications

## 2017-03-16 NOTE — Progress Notes (Signed)
[facility] PROGRESS NOTE  Julia Floyd HKV:[id number] DOB: 28-Sep-1930 DOA: 03/13/2017  PCP: Josetta Huddle, MD  Brief History/Interval Summary: 83 year old African-American female with a past medical history of stroke, history of intracranial hemorrhage status post TPA, hypertension, diabetes who is largely bedbound and was brought into the hospital as patient was experiencing increasing discharge from her sacral wound.  There was also concern about right heel wound.  Evaluation in this emergency department raised concern for necrotizing fasciitis.  General surgery was consulted.  Plan is for the patient to go to the OR today for surgical debridement.  Reason for Visit: Infected sacral wound and osteomyelitis  Consultants: General surgery  Procedures:   Blood transfusion x 2   Antibiotics: Vancomycin, Zosyn  Subjective/Interval History: Patient experiencing some pain in her back at the wound site.  Denies any other complaints otherwise.     ROS: No chest pain or shortness of breath.  Objective:  Vital Signs  Vitals:   03/15/17 0613 03/15/17 1411 03/15/17 2112 03/16/17 0622  BP:  (!) 139/54 (!) 140/59 (!) 118/54  Pulse:  80 94 85  Resp:  18 18 18   Temp:  (!) 97.5 F (36.4 C) 98.5 F (36.9 C) 98.4 F (36.9 C)  TempSrc:  Oral Oral Oral  SpO2:  100% 100% 100%  Weight: 72.1 kg (158 lb 15.2 oz)   76.5 kg (168 lb 10.4 oz)  Height:        Intake/Output Summary (Last 24 hours) at 03/16/2017 0838 Last data filed at 03/16/2017 5643 Gross per 24 hour  Intake 1000 ml  Output 600 ml  Net 400 ml   Filed Weights   03/14/17 0614 03/15/17 0613 03/16/17 0622  Weight: 68.9 kg (152 lb) 72.1 kg (158 lb 15.2 oz) 76.5 kg (168 lb 10.4 oz)    General appearance: Awake alert.  In no distress. Resp: Clear to auscultation bilaterally.  No wheezing rales or rhonchi Cardio: S1-S2 is normal regular.  No S3-S4.  No rubs murmurs or bruit GI: Abdomen is soft.  Nontender  nondistended.  Bowel sounds are present.  No masses or organomegaly  Extremities: She also has heel ulcer on the right.  No drainage noted. Neurologic: Awake alert.  Old left-sided hemiparesis.  Lab Results:  Data Reviewed: I have personally reviewed following labs and imaging studies  CBC: Recent Labs  Lab 03/13/17 2104 03/14/17 0555 03/15/17 0602 03/16/17 0519  WBC 13.6* 11.5* 10.4 9.7  NEUTROABS 10.8* 8.7*  --   --   HGB 7.6* 6.5* 9.4* 9.8*  HCT 24.0* 21.0* 29.2* 31.1*  MCV 85.7 87.9 84.4 84.3  PLT 262 220 200 329    Basic Metabolic Panel: Recent Labs  Lab 03/13/17 2104 03/14/17 0555 03/15/17 0602 03/16/17 0519  NA 136 138 137 138  K 5.4* 4.2 3.8 3.7  CL 105 107 108 109  CO2 24 25 22 24   GLUCOSE 144* 104* 102* 98  BUN 27* 24* 16 16  CREATININE 1.03* 0.98 0.82 0.92  CALCIUM 8.5* 8.0* 8.1* 8.3*    GFR: Estimated Creatinine Clearance: 47.6 mL/min (by C-G formula based on SCr of 0.92 mg/dL).  Liver Function Tests: Recent Labs  Lab 03/13/17 2104 03/14/17 0555  AST 36 21  ALT 11* 9*  ALKPHOS 53 49  BILITOT 1.3* 0.6  PROT 7.0 6.3*  ALBUMIN 2.3* 2.0*    CBG: Recent Labs  Lab 03/15/17 0732 03/15/17 1146 03/15/17 1711 03/15/17 2040 03/16/17 0731  GLUCAP 121* 99 177* 108* 77  Results for orders placed or performed during the hospital encounter of 03/13/17  Blood culture (routine x 2)     Status: None (Preliminary result)   Collection Time: 03/13/17  9:14 PM  Result Value Ref Range Status   Specimen Description BLOOD LEFT FOREARM  Final   Special Requests   Final    BOTTLES DRAWN AEROBIC AND ANAEROBIC Blood Culture adequate volume   Culture   Final    NO GROWTH 1 DAY Performed at [facility], [place] [address] [place], [place] [address]    Report Status PENDING  Incomplete  Blood culture (routine x 2)     Status: None (Preliminary result)   Collection Time: 03/13/17 11:56 PM  Result Value Ref Range Status   Specimen Description BLOOD RIGHT  HAND  Final   Special Requests IN PEDIATRIC BOTTLE Blood Culture adequate volume  Final   Culture  Setup Time   Final    GRAM POSITIVE COCCI IN CLUSTERS IN PEDIATRIC BOTTLE CRITICAL RESULT CALLED TO, READ BACK BY AND VERIFIED WITH: E JACKSON,PHARMD AT 9485 03/15/17 BY L BENFIELD Performed at [facility], [place] [address] [place], [place] [address]    Culture GRAM POSITIVE COCCI  Final   Report Status PENDING  Incomplete  Blood Culture ID Panel (Reflexed)     Status: Abnormal   Collection Time: 03/13/17 11:56 PM  Result Value Ref Range Status   Enterococcus species NOT DETECTED NOT DETECTED Final   Listeria monocytogenes NOT DETECTED NOT DETECTED Final   Staphylococcus species DETECTED (A) NOT DETECTED Final    Comment: Methicillin (oxacillin) resistant coagulase negative staphylococcus. Possible blood culture contaminant (unless isolated from more than one blood culture draw or clinical case suggests pathogenicity). No antibiotic treatment is indicated for blood  culture contaminants. CRITICAL RESULT CALLED TO, READ BACK BY AND VERIFIED WITH: E JACKSON,PHARMD AT 3500 03/15/17 BY L BENFIELD    Staphylococcus aureus NOT DETECTED NOT DETECTED Final   Methicillin resistance DETECTED (A) NOT DETECTED Final    Comment: CRITICAL RESULT CALLED TO, READ BACK BY AND VERIFIED WITH: E JACKSON,PHARMD AT 0714 03/15/17 BY L BENFIELD    Streptococcus species NOT DETECTED NOT DETECTED Final   Streptococcus agalactiae NOT DETECTED NOT DETECTED Final   Streptococcus pneumoniae NOT DETECTED NOT DETECTED Final   Streptococcus pyogenes NOT DETECTED NOT DETECTED Final   Acinetobacter baumannii NOT DETECTED NOT DETECTED Final   Enterobacteriaceae species NOT DETECTED NOT DETECTED Final   Enterobacter cloacae complex NOT DETECTED NOT DETECTED Final   Escherichia coli NOT DETECTED NOT DETECTED Final   Klebsiella oxytoca NOT DETECTED NOT DETECTED Final   Klebsiella pneumoniae NOT DETECTED NOT DETECTED  Final   Proteus species NOT DETECTED NOT DETECTED Final   Serratia marcescens NOT DETECTED NOT DETECTED Final   Haemophilus influenzae NOT DETECTED NOT DETECTED Final   Neisseria meningitidis NOT DETECTED NOT DETECTED Final   Pseudomonas aeruginosa NOT DETECTED NOT DETECTED Final   Candida albicans NOT DETECTED NOT DETECTED Final   Candida glabrata NOT DETECTED NOT DETECTED Final   Candida krusei NOT DETECTED NOT DETECTED Final   Candida parapsilosis NOT DETECTED NOT DETECTED Final   Candida tropicalis NOT DETECTED NOT DETECTED Final    Comment: Performed at [facility], [place]. [address], [place], [place] [address]     Radiology Studies: No results found.   Medications:  Scheduled: . feeding supplement (ENSURE ENLIVE)  237 mL Oral BID BM  . insulin aspart  0-9 Units  Subcutaneous TID WC   Continuous: . sodium chloride 50 mL/hr at 03/15/17 1952  . piperacillin-tazobactam (ZOSYN)  IV Stopped (03/16/17 0902)  . vancomycin Stopped (03/16/17 0357)   YCX:[id number] **OR** acetaminophen, ondansetron **OR** ondansetron (ZOFRAN) IV  Assessment/Plan:  Active Problems:   HTN (hypertension)   Stroke (cerebrum) (HCC) -  Suspect stroke/TIA s/p tPA with multifocal ICH - etiology unclear, concerning for underlying CAA   Benign essential HTN   Diabetes mellitus type 2 in nonobese (HCC)   Wound infection   Skin ulcer of sacrum with necrosis of muscle (HCC)   Normochromic normocytic anemia    Infected sacral wound with possible necrotizing fasciitis/sacral osteomyelitis No evidence for sepsis at this time.  Lactic acid level was normal.  Patient was started on vancomycin and Zosyn and clindamycin.  Currently on vancomycin and Zosyn.  Follow-up on cultures.  One set is positive for gram-positive cocci.  General surgery is following with plans for surgical debridement today.  Will eventually need to consult infectious disease.   Normocytic anemia Baseline about 10.  Stool for occult  blood was negative.  Hemoglobin dropped to 6 and patient was transfused 2 units of blood.  Hemoglobin has responded.  Unfortunately anemia panel was not sent prior to transfusion.   Diabetes mellitus type 2 Continue sliding scale insulin coverage.  Continue to monitor CBGs.  Occasional low values noted.  History of essential hypertension Blood pressures were borderline low initially.  Blood pressures appear to have stabilized.  Continue to monitor closely.    Ulcer involving right heel/possible peripheral artery disease No drainage noted from the heel ulcer.  Wound care nurse has evaluated patient.  Patient was supposed to see vascular surgery as an outpatient.   Previous history of stroke status post TPA complicated by intracranial hemorrhage Currently stable.  Not a candidate for antiplatelet agents or anticoagulation as per neurology previously.  Mild acute renal failure Appears to be resolved with IV fluids.  Multiple uterine fibroids possibly causing mild left-sided hydronephrosis Incidentally noted on CT scan.  Will need outpatient management.  DVT Prophylaxis: SCDs for now    Code Status: Full code Family Communication: Discussed with the patient and her daughter. Disposition Plan: Management as outlined above.  Surgery today.    LOS: 3 days   [facility] Pager [phone] 03/16/2017, 8:38 AM  If 7PM-7AM, please contact night-coverage at [email or web address], password [facility]

## 2017-03-17 ENCOUNTER — Ambulatory Visit (HOSPITAL_COMMUNITY): Payer: Medicare Other

## 2017-03-17 ENCOUNTER — Encounter (HOSPITAL_COMMUNITY): Payer: Self-pay | Admitting: Surgery

## 2017-03-17 DIAGNOSIS — S91301A Unspecified open wound, right foot, initial encounter: Secondary | ICD-10-CM

## 2017-03-17 DIAGNOSIS — M868X8 Other osteomyelitis, other site: Secondary | ICD-10-CM

## 2017-03-17 DIAGNOSIS — E11622 Type 2 diabetes mellitus with other skin ulcer: Secondary | ICD-10-CM

## 2017-03-17 DIAGNOSIS — F0390 Unspecified dementia without behavioral disturbance: Secondary | ICD-10-CM

## 2017-03-17 DIAGNOSIS — D649 Anemia, unspecified: Secondary | ICD-10-CM

## 2017-03-17 DIAGNOSIS — M4628 Osteomyelitis of vertebra, sacral and sacrococcygeal region: Secondary | ICD-10-CM

## 2017-03-17 DIAGNOSIS — R197 Diarrhea, unspecified: Secondary | ICD-10-CM

## 2017-03-17 DIAGNOSIS — I700 Atherosclerosis of aorta: Secondary | ICD-10-CM

## 2017-03-17 DIAGNOSIS — R32 Unspecified urinary incontinence: Secondary | ICD-10-CM

## 2017-03-17 DIAGNOSIS — Z8673 Personal history of transient ischemic attack (TIA), and cerebral infarction without residual deficits: Secondary | ICD-10-CM

## 2017-03-17 DIAGNOSIS — Z87891 Personal history of nicotine dependence: Secondary | ICD-10-CM

## 2017-03-17 DIAGNOSIS — L98413 Non-pressure chronic ulcer of buttock with necrosis of muscle: Secondary | ICD-10-CM

## 2017-03-17 LAB — CBC
HCT: 29.4 % — ABNORMAL LOW (ref 36.0–46.0)
Hemoglobin: 9.1 g/dL — ABNORMAL LOW (ref 12.0–15.0)
MCH: 26.4 pg (ref 26.0–34.0)
MCHC: 31 g/dL (ref 30.0–36.0)
MCV: 85.2 fL (ref 78.0–100.0)
PLATELETS: 172 10*3/uL (ref 150–400)
RBC: 3.45 MIL/uL — AB (ref 3.87–5.11)
RDW: 17 % — AB (ref 11.5–15.5)
WBC: 8.2 10*3/uL (ref 4.0–10.5)

## 2017-03-17 LAB — BASIC METABOLIC PANEL
Anion gap: 3 — ABNORMAL LOW (ref 5–15)
BUN: 14 mg/dL (ref 6–20)
CALCIUM: 8.3 mg/dL — AB (ref 8.9–10.3)
CO2: 24 mmol/L (ref 22–32)
Chloride: 112 mmol/L — ABNORMAL HIGH (ref 101–111)
Creatinine, Ser: 0.81 mg/dL (ref 0.44–1.00)
GFR calc Af Amer: >60 mL/min (ref >60)
GLUCOSE: 107 mg/dL — AB (ref 65–99)
POTASSIUM: 3.5 mmol/L (ref 3.5–5.1)
SODIUM: 139 mmol/L (ref 135–145)

## 2017-03-17 LAB — CULTURE, BLOOD (ROUTINE X 2): Special Requests: ADEQUATE

## 2017-03-17 LAB — GLUCOSE, CAPILLARY
GLUCOSE-CAPILLARY: 135 mg/dL — AB (ref 65–99)
Glucose-Capillary: 87 mg/dL (ref 65–99)
Glucose-Capillary: 97 mg/dL (ref 65–99)
Glucose-Capillary: 98 mg/dL (ref 65–99)

## 2017-03-17 MED ORDER — LINEZOLID 600 MG PO TABS
600.0000 mg | ORAL_TABLET | Freq: Two times a day (BID) | ORAL | Status: DC
  Administered 2017-03-17 – 2017-03-30 (×27): 600 mg via ORAL
  Filled 2017-03-17 (×27): qty 1

## 2017-03-17 MED ORDER — GABAPENTIN 100 MG PO CAPS
100.0000 mg | ORAL_CAPSULE | Freq: Every day | ORAL | Status: DC
  Administered 2017-03-17 – 2017-03-29 (×13): 100 mg via ORAL
  Filled 2017-03-17 (×13): qty 1

## 2017-03-17 MED ORDER — COLLAGENASE 250 UNIT/GM EX OINT
TOPICAL_OINTMENT | Freq: Two times a day (BID) | CUTANEOUS | Status: AC
  Administered 2017-03-17 – 2017-03-24 (×13): via TOPICAL
  Filled 2017-03-17 (×2): qty 90

## 2017-03-17 MED ORDER — AMLODIPINE BESYLATE 10 MG PO TABS: 10.0000 mg | ORAL_TABLET | Freq: Every day | ORAL | Status: DC

## 2017-03-17 MED ORDER — FUROSEMIDE 20 MG PO TABS
20.0000 mg | ORAL_TABLET | Freq: Every day | ORAL | Status: DC
  Administered 2017-03-17 – 2017-03-30 (×14): 20 mg via ORAL
  Filled 2017-03-17 (×14): qty 1

## 2017-03-17 NOTE — Evaluation (Signed)
Physical Therapy Evaluation Patient Details Name: Julia Floyd MRN: [id number] DOB: 1930/05/26 Today's Date: 03/17/2017   History of Present Illness  83 yo female admitted with multiple wounds. Sacral wound found to have osteomyelitis-s/p I&D 03/16/17. Hx of CVA, dementia, R ankle dislocation/fx 12/2016    Clinical Impression  On eval, pt required Max assist +2 for mobility. She was able to sit EOB for ~4-5 minutes with Mod assist for static sitting balance. Significant bowel incontinence noticed so assisted pt back to bed. Total assist for hygiene. Recommend SNF. Will follow and progress activity as tolerated.     Follow Up Recommendations SNF    Equipment Recommendations  None recommended by PT    Recommendations for Other Services       Precautions / Restrictions Precautions Precautions: Fall Restrictions Weight Bearing Restrictions: No      Mobility  Bed Mobility Overal bed mobility: Needs Assistance Bed Mobility: Rolling;Sidelying to Sit;Sit to Sidelying Rolling: Max assist Sidelying to sit: Max assist;+2 for physical assistance;+2 for safety/equipment     Sit to sidelying: Max assist;+2 for physical assistance;+2 for safety/equipment General bed mobility comments: Assist for trunk and LEs. Multimodal cueing and increased time required. Pt sat EOB ~4-5 minutes with Mod assist for balance.   Transfers                 General transfer comment: NT-significant bowel incontinence  Ambulation/Gait                Stairs            Wheelchair Mobility    Modified Rankin (Stroke Patients Only)       Balance Overall balance assessment: Needs assistance;History of Falls Sitting-balance support: Feet supported;Bilateral upper extremity supported Sitting balance-Leahy Scale: Poor Sitting balance - Comments: Mod assist for static sitting balance.                                      Pertinent Vitals/Pain Pain Assessment:  No/denies pain    Home Living Family/patient expects to be discharged to:: Skilled nursing facility Living Arrangements: Children               Additional Comments: Pt has an aide that comes every morning for 2 hours to assist with ADL    Prior Function Level of Independence: Needs assistance   Gait / Transfers Assistance Needed: per pt, she has required 1-2 assist for transfers since 09/2016. Prior to this, she was able to tranfer, bed<>chair with Mod Ind.  ADL's / Homemaking Assistance Needed: assist required with ADLs        Hand Dominance        Extremity/Trunk Assessment   Upper Extremity Assessment Upper Extremity Assessment: Generalized weakness    Lower Extremity Assessment Lower Extremity Assessment: Generalized weakness       Communication   Communication: No difficulties  Cognition Arousal/Alertness: Awake/alert Behavior During Therapy: WFL for tasks assessed/performed Overall Cognitive Status: History of cognitive impairments - at baseline                                        General Comments      Exercises     Assessment/Plan    PT Assessment Patient needs continued PT services  PT Problem List Decreased strength;Decreased balance;Decreased activity tolerance;Decreased mobility;Decreased  skin integrity       PT Treatment Interventions Therapeutic activities;Therapeutic exercise;Balance training;Functional mobility training;Patient/family education    PT Goals (Current goals can be found in the Care Plan section)  Acute Rehab PT Goals Patient Stated Goal: none stated PT Goal Formulation: With patient Time For Goal Achievement: 03/31/17 Potential to Achieve Goals: Fair    Frequency Min 2X/week   Barriers to discharge        Co-evaluation               AM-PAC PT "6 Clicks" Daily Activity  Outcome Measure Difficulty turning over in bed (including adjusting bedclothes, sheets and blankets)?:  Unable Difficulty moving from lying on back to sitting on the side of the bed? : Unable Difficulty sitting down on and standing up from a chair with arms (e.g., wheelchair, bedside commode, etc,.)?: Unable Help needed moving to and from a bed to chair (including a wheelchair)?: Total Help needed walking in hospital room?: Total Help needed climbing 3-5 steps with a railing? : Total 6 Click Score: 6    End of Session   Activity Tolerance: Patient tolerated treatment well Patient left: in bed;with call bell/phone within reach   PT Visit Diagnosis: History of falling (Z91.81);Muscle weakness (generalized) (M62.81);Other abnormalities of gait and mobility (R26.89)    Time: 1340-1350 PT Time Calculation (min) (ACUTE ONLY): 10 min   Charges:   PT Evaluation $PT Eval Moderate Complexity: 1 Mod     PT G Codes:          Weston Anna, MPT Pager: [phone]

## 2017-03-17 NOTE — Consult Note (Signed)
[facility]       Reason for Consult: osteomyelitis    Referring Physician: Dr. Sarajane Jews  Principal Problem:   Sacral osteomyelitis ([place]) Active Problems:   Stroke (cerebrum) ([place]) -  Suspect stroke/TIA s/p tPA with multifocal ICH - etiology unclear, concerning for underlying CAA   Benign essential HTN   Diabetes mellitus type 2 in nonobese ([place])   Skin ulcer of sacrum with necrosis of muscle ([facility])   Normochromic normocytic anemia   Open wound of right heel   Aortic atherosclerosis ([place])   . collagenase   Topical BID  . feeding supplement  1 Container Oral TID BM  . feeding supplement (ENSURE ENLIVE)  237 mL Oral BID BM  . furosemide  20 mg Oral Daily  . gabapentin  100-200 mg Oral QHS  . insulin aspart  0-9 Units Subcutaneous TID WC    Recommendations: Stop vancomycin and zosyn  Start linezolid   Assessment: She has osteomyelitis of unknown duration based on CT scan of mild erosion of left-sided sacrum "concerning' for underlying osteomyelitis.  With her extensive wound, osteomyelitis is certainly possible.  She also has extensive necrosis of the area including muscle s/p debridement by Dr. Kae Heller.  Per nursing she also has loose stools and soiling the wound and she has urinary incontinence.   Based on this, I think the appropriate treatment will depend on the goals.  Ideally, she would need diversion of stool and/or urine, great improvements in her nutrition, constant care of offloading and /or increase in her ambulation and aggressive wound care as an outpatient.  In this situation, it may be reasonable to treat her for 6 weeks with IV therapy for osteomyelitis.  However, without above, she will continue to have contamination and no chance of healing.   Therefore, base on the current situation, I would recommend treating the current problem with oral linezolid for 2 weeks for the cellulitis, necrotic tissue along with hydrotherapy.  I do not feel  suppressive antibiotics have any utility in this situation so I do not recommend.   If more aggressive care is considered, we can reevaluate for IV therapy, if indicated at that time.    Antibiotics: Total antibiotics days 5 vancomcycin day 5 Pip-tazo day 5  HPI: Julia Floyd is a 83 y.o. female with dementia, chronically bedbound, history of CVA requiring TPA, diabetes, who presented with increasing discharge from the sacral decubitus ulcer.  On CT scan, there was concern for necrotizing fasciitis but in OR found necrotic skin and muscle at areas of pressure.  She has been living with family and now looking for SNF placement.  No report of fever or chills.  Patient does not endorse any concerns now.  No associated rash.  Having loose stools.   Review of Systems:  Constitutional: negative for fevers and chills Integument/breast: negative for rash All other systems reviewed and are negative    Past Medical History:  Diagnosis Date  . DDD (degenerative disc disease)   . Depression   . Diabetes ([place])   . HTN (hypertension)   . Psychotic disorder ([place])    secondary to the below factors. 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 Bipolar disorder, most likely type 3, this episode being manic  . Sixth cranial nerve palsy   . Stroke ([place])   . Trigeminal neuralgia   . Uterine fibroid     Social History   Tobacco Use  . Smoking status: Former Research scientist (life sciences)  .  Smokeless tobacco: Never Used  Substance Use Topics  . Alcohol use: No  . Drug use: No    Family History  Problem Relation Age of Onset  . Heart attack Unknown   . Diabetes Unknown     No Known Allergies  Physical Exam: Constitutional: in no apparent distress and alert, answers questions Vitals:   03/16/17 2231 03/17/17 0602  BP: (!) 112/39 (!) 121/34  Pulse: 90 79  Resp: 16 16  Temp: 98.5 F (36.9 C) 98.6 F (37 C)  SpO2: 100% 100%   EYES: anicteric ENMT: no thrush Cardiovascular: Cor  RRR Respiratory: CTA B; normal respiratory effort GI: Bowel sounds are normal, liver is not enlarged, spleen is not enlarged Musculoskeletal: no pedal edema noted Skin: negatives: no rash Hematologic: no cervical lad  Lab Results  Component Value Date   WBC 8.2 03/17/2017   HGB 9.1 (L) 03/17/2017   HCT 29.4 (L) 03/17/2017   MCV 85.2 03/17/2017   PLT 172 03/17/2017    Lab Results  Component Value Date   CREATININE 0.81 03/17/2017   BUN 14 03/17/2017   NA 139 03/17/2017   K 3.5 03/17/2017   CL 112 (H) 03/17/2017   CO2 24 03/17/2017    Lab Results  Component Value Date   ALT 9 (L) 03/14/2017   AST 21 03/14/2017   ALKPHOS 49 03/14/2017     Microbiology: Recent Results (from the past 240 hour(s))  Blood culture (routine x 2)     Status: None (Preliminary result)   Collection Time: 03/13/17  9:14 PM  Result Value Ref Range Status   Specimen Description BLOOD LEFT FOREARM  Final   Special Requests   Final    BOTTLES DRAWN AEROBIC AND ANAEROBIC Blood Culture adequate volume   Culture   Final    NO GROWTH 2 DAYS Performed at [facility], [address] [place], [place] [address]    Report Status PENDING  Incomplete  Blood culture (routine x 2)     Status: Abnormal   Collection Time: 03/13/17 11:56 PM  Result Value Ref Range Status   Specimen Description BLOOD RIGHT HAND  Final   Special Requests IN PEDIATRIC BOTTLE Blood Culture adequate volume  Final   Culture  Setup Time   Final    GRAM POSITIVE COCCI IN CLUSTERS IN PEDIATRIC BOTTLE CRITICAL RESULT CALLED TO, READ BACK BY AND VERIFIED WITH: E JACKSON,PHARMD AT 9924 03/15/17 BY L BENFIELD    Culture (A)  Final    STAPHYLOCOCCUS SPECIES (COAGULASE NEGATIVE) THE SIGNIFICANCE OF ISOLATING THIS ORGANISM FROM A SINGLE SET OF BLOOD CULTURES WHEN MULTIPLE SETS ARE DRAWN IS UNCERTAIN. PLEASE NOTIFY THE MICROBIOLOGY DEPARTMENT WITHIN ONE WEEK IF SPECIATION AND SENSITIVITIES ARE REQUIRED. Performed at [facility], [place] [address], [place], [place] [address]    Report Status 03/17/2017 FINAL  Final  Blood Culture ID Panel (Reflexed)     Status: Abnormal   Collection Time: 03/13/17 11:56 PM  Result Value Ref Range Status   Enterococcus species NOT DETECTED NOT DETECTED Final   Listeria monocytogenes NOT DETECTED NOT DETECTED Final   Staphylococcus species DETECTED (A) NOT DETECTED Final    Comment: Methicillin (oxacillin) resistant coagulase negative staphylococcus. Possible blood culture contaminant (unless isolated from more than one blood culture draw or clinical case suggests pathogenicity). No antibiotic treatment is indicated for blood  culture contaminants. CRITICAL RESULT CALLED TO, READ BACK BY AND VERIFIED WITH: E JACKSON,PHARMD AT 1962 03/15/17 BY L  BENFIELD    Staphylococcus aureus NOT DETECTED NOT DETECTED Final   Methicillin resistance DETECTED (A) NOT DETECTED Final    Comment: CRITICAL RESULT CALLED TO, READ BACK BY AND VERIFIED WITH: E JACKSON,PHARMD AT 0714 03/15/17 BY L BENFIELD    Streptococcus species NOT DETECTED NOT DETECTED Final   Streptococcus agalactiae NOT DETECTED NOT DETECTED Final   Streptococcus pneumoniae NOT DETECTED NOT DETECTED Final   Streptococcus pyogenes NOT DETECTED NOT DETECTED Final   Acinetobacter baumannii NOT DETECTED NOT DETECTED Final   Enterobacteriaceae species NOT DETECTED NOT DETECTED Final   Enterobacter cloacae complex NOT DETECTED NOT DETECTED Final   Escherichia coli NOT DETECTED NOT DETECTED Final   Klebsiella oxytoca NOT DETECTED NOT DETECTED Final   Klebsiella pneumoniae NOT DETECTED NOT DETECTED Final   Proteus species NOT DETECTED NOT DETECTED Final   Serratia marcescens NOT DETECTED NOT DETECTED Final   Haemophilus influenzae NOT DETECTED NOT DETECTED Final   Neisseria meningitidis NOT DETECTED NOT DETECTED Final   Pseudomonas aeruginosa NOT DETECTED NOT DETECTED Final   Candida albicans NOT DETECTED NOT DETECTED Final    Candida glabrata NOT DETECTED NOT DETECTED Final   Candida krusei NOT DETECTED NOT DETECTED Final   Candida parapsilosis NOT DETECTED NOT DETECTED Final   Candida tropicalis NOT DETECTED NOT DETECTED Final    Comment: Performed at [facility], [place]. [address], [place], [place] [address]  MRSA PCR Screening     Status: None   Collection Time: 03/16/17  1:37 PM  Result Value Ref Range Status   MRSA by PCR NEGATIVE NEGATIVE Final    Comment:        The GeneXpert MRSA Assay (FDA approved for NASAL specimens only), is one component of a comprehensive MRSA colonization surveillance program. It is not intended to diagnose MRSA infection nor to guide or monitor treatment for MRSA infections.     Thayer Headings, MD [facility] [email or web address] [phone] pager  [phone] cell 03/17/2017, 2:50 PM

## 2017-03-17 NOTE — Care Management Important Message (Signed)
Important Message  Patient Details  Name: ANGELYN OSTERBERG MRN: [id number] Date of Birth: [birth year]   Medicare Important Message Given:  Yes    Kerin Salen 03/17/2017, 11:25 AMImportant Message  Patient Details  Name: LISETH WANN MRN: [id number] Date of Birth: 12/21/1930   Medicare Important Message Given:  Yes    Kerin Salen 03/17/2017, 11:25 AM

## 2017-03-17 NOTE — Progress Notes (Signed)
1 Day Post-Op    CC:  Sacral decubitus  Subjective: Pod 1 after debridement  Objective: Vital signs in last 24 hours: Temp:  [97.8 F (36.6 C)-98.7 F (37.1 C)] 98.6 F (37 C) (12/18 0602) Pulse Rate:  [79-90] 79 (12/18 0602) Resp:  [12-19] 16 (12/18 0602) BP: (112-160)/(34-73) 121/34 (12/18 0602) SpO2:  [100 %] 100 % (12/18 0602) Weight:  [77.6 kg (171 lb 1.2 oz)] 77.6 kg (171 lb 1.2 oz) (12/18 0602) Last BM Date: 03/15/17  Intake/Output from previous day: 12/17 0701 - 12/18 0700 In: 1554.2 [I.V.:1454.2; IV Piggyback:100] Out: 20 [Blood:20] Intake/Output this shift: No intake/output data recorded.      Lab Results:  Recent Labs    03/16/17 0519 03/17/17 0531  WBC 9.7 8.2  HGB 9.8* 9.1*  HCT 31.1* 29.4*  PLT 196 172    BMET Recent Labs    03/16/17 0519 03/17/17 0531  NA 138 139  K 3.7 3.5  CL 109 112*  CO2 24 24  GLUCOSE 98 107*  BUN 16 14  CREATININE 0.92 0.81  CALCIUM 8.3* 8.3*   PT/INR No results for input(s): LABPROT, INR in the last 72 hours.  Recent Labs  Lab 03/13/17 2104 03/14/17 0555  AST 36 21  ALT 11* 9*  ALKPHOS 53 49  BILITOT 1.3* 0.6  PROT 7.0 6.3*  ALBUMIN 2.3* 2.0*     Lipase  No results found for: LIPASE   Medications: . feeding supplement  1 Container Oral TID BM  . feeding supplement (ENSURE ENLIVE)  237 mL Oral BID BM  . insulin aspart  0-9 Units Subcutaneous TID WC   . sodium chloride 50 mL/hr at 03/16/17 1830  . piperacillin-tazobactam (ZOSYN)  IV 3.375 g (03/17/17 0529)  . vancomycin Stopped (03/17/17 0529)    Assessment/Plan Sacral Decubitus Incision and debridement of sacral stage IV decubitus (15x20x3cm including subcutaneous, fascia, muscle and periosteum) and left upper posterior thigh full thickness wound (3x2x2cm, subcutaneous)  Normocytic anemia - hgb 9.8 this AM HTN T2 Diabetes Mellitus Left heal ulcer/PAD Hx of stroke - stable Multiple uterine fibroids  FEN: NPO, IVF VTE: SCDs ID: IV  vanc and zosyn 12/14>>, clindamycin 12/14>12/15   Plan:  Hydro Rx and wet to dry for now.       LOS: 4 days    Maysun Meditz 03/17/2017 [phone]

## 2017-03-17 NOTE — Progress Notes (Addendum)
HYDROTHERAPY EVALUATION      03/17/17 1600  Subjective Assessment  Subjective "It feels better"  Date of Onset (unknown-present on admission)  Evaluation and Treatment  Evaluation and Treatment Procedures Explained to Patient/Family Yes  Evaluation and Treatment Procedures agreed to  Pressure Injury 03/17/17 Unstageable - Full thickness tissue loss in which the base of the ulcer is covered by slough (yellow, tan, gray, green or brown) and/or eschar (tan, brown or black) in the wound bed. *PT*  Date First Assessed/Time First Assessed: 03/17/17 1340   Location: Ischial tuberosity  Location Orientation: Left  Staging: Unstageable - Full thickness tissue loss in which the base of the ulcer is covered by slough (yellow, tan, gray, green or brown...  Dressing Type ABD;Moist to moist;Barrier Film (skin prep);Gauze (Comment) (Santyl)  Dressing Change Frequency Twice a day  State of Healing Non-healing  Site / Wound Assessment Pink;Yellow;Brown  % Wound base Red or Granulating 5%  % Wound base Yellow/Fibrinous Exudate 95%  Peri-wound Assessment Excoriated;Pink  Wound Length (cm) 2.5 cm  Wound Width (cm) 3 cm  Wound Depth (cm) 1.8 cm  Wound Surface Area (cm^2) 7.5 cm^2  Wound Volume (cm^3) 13.5 cm^3  Margins Unattached edges (unapproximated)  Drainage Amount Minimal  Drainage Description Serosanguineous  Treatment Debridement (Selective);Hydrotherapy (Pulse lavage);Packing (Saline gauze) (Santyl)  Pressure Injury 03/17/17 Stage IV - Full thickness tissue loss with exposed bone, tendon or muscle. *PT*  S/P I&D 03/16/17  Date First Assessed/Time First Assessed: 03/17/17 1340   Location: Sacrum  Staging: Stage IV - Full thickness tissue loss with exposed bone, tendon or muscle.  Wound Description (Comments): *PT*  S/P I&D 03/16/17  Present on Admission: Yes  Dressing Type ABD;Gauze (Comment);Moist to moist;Barrier Film (skin prep) (Santyl)  Dressing Changed  Dressing Change Frequency Twice a  day  State of Healing Non-healing  Site / Wound Assessment Pink;Red;Yellow;Brown  % Wound base Red or Granulating 40%  % Wound base Black/Eschar 60%  Peri-wound Assessment Intact  Wound Length (cm) 17 cm  Wound Width (cm) 23 cm  Wound Depth (cm) 3 cm  Wound Surface Area (cm^2) 391 cm^2  Wound Volume (cm^3) 1173 cm^3  Undermining (cm) 2.3 (12:00)  Margins Unattached edges (unapproximated)  Drainage Amount Moderate  Drainage Description Serosanguineous  Treatment Debridement (Selective);Hydrotherapy (Pulse lavage);Packing (Saline gauze) (Santyl)  Hydrotherapy  Pulsed lavage therapy - wound location L Ischial Tuberosity and Sacrum  Pulsed Lavage with Suction (psi) 8 psi  Pulsed Lavage with Suction - Normal Saline Used 1000 mL  Pulsed Lavage Tip Tip with splash shield  Wound Therapy - Assess/Plan/Recommendations  Wound Therapy - Clinical Statement 83 yo female admitted from home with multiple wounds including sacral wound found to have osteomyelitis. Sacral wound S/P Incision and Drainage 03/16/17. Hx of CVA, dementia, DM. PT following for hydrotherapy for L ischial tuberosity wound and sacral wound.  Wound Therapy - Functional Problem List Limited mobility due to hx of CVA and recent fall in 09/2016 (per pt). Long periods of time spent sitting in wheelchair without pressure redistribution  Factors Delaying/Impairing Wound Healing Diabetes Mellitus;Incontinence;Immobility;Infection - systemic/local  Hydrotherapy Plan Debridement;Dressing change;Patient/family education;Pulsatile lavage with suction  Wound Therapy - Frequency 6X / week  Wound Therapy - Current Recommendations Case manager/social work  Wound Therapy - Follow Up Recommendations Skilled nursing facility  Wound Therapy Goals - Improve the function of patient's integumentary system by progressing the wound(s) through the phases of wound healing by:  Decrease Necrotic Tissue to 25%  Decrease Necrotic Tissue -  Progress Goal set  today  Increase Granulation Tissue to 75%  Increase Granulation Tissue - Progress Goal set today  Improve Drainage Characteristics Min  Improve Drainage Characteristics - Progress Goal set today  Goals/treatment plan/discharge plan were made with and agreed upon by patient/family Yes  Time For Goal Achievement 2 weeks  Wound Therapy - Potential for Goals Poor   Weston Anna, MPT [phone]

## 2017-03-17 NOTE — Progress Notes (Signed)
PROGRESS NOTE  Julia Floyd UDJ:[id number] DOB: [birth year] DOA: 03/13/2017 PCP: Josetta Huddle, MD  Brief Narrative: 63yow PMH stroke, DM, largely bedbound presented with increasing discharge from sacral wound and worsening right heel wound.CT abd/pelvis concerning fo necrotizing fasciitis. Admitted for IV abx, surgical evaluation.  Assessment/Plan Sacral wound unstagable, osteomyelitis, acute infection. S/p OR debridement 12/17 - BC 1/2 coag negative Staph. MRSA PCR negative. - continue abx pending ID consult for recs. Suppressive therapy? - per surgery goal is local wound control. Full wound-healing not anticipated given non-ambulatory status.  Right heel wound, pressure +/- ischemia. - check ABI - wound care per RN   Normocytic anemia. S/p 2 units PRBC 12/15. - Hgb stable. Follow clinically.  DM type 2 - CBG stable, continue SSI - continue gabapentin - hold metformin  Essential HTN - stable, continue amlodipine   CVA 09/2016 s/p TPA, s/p intracranal hemorrhage.  - not a candidate for antiplatelets or anticoagulation.  Dementia, mixed type, Alzheimers, vascular  Multiple uterine fibroids possibly causing mild left-sided hydronephrosis Incidentally noted on CT scan.  Outpatient follow-up.  Aortic atherosclerosis  - not currently on statin. Recommend outpatient f/u.  DVT prophylaxis: SCDs Code Status: full Family Communication: daughter at bedside Disposition Plan: SNF vs home. Consult social work.   Murray Hodgkins, MD  [facility] Direct contact: [phone] --Via amion app OR  --[email or web address]; password [email or web address]  7PM-7AM contact night coverage as above 03/17/2017, 12:47 PM  LOS: 4 days   Consultants:  General surgery  Procedures:  2 units PRBC transfusion 12/15  12/17 Procedure performed: Incision and debridement of sacral stage IV decubitus (15x20x3cm including subcutaneous, fascia, muscle and periosteum) and left upper posterior thigh full  thickness wound (3x2x2cm, subcutaneous)  Antimicrobials: 12/14 Vancomycin >>  12/14 Zosyn >> 12/15 clindamycin >>12/12  Interval history/Subjective: Feels ok. Would like some Tylenol for wound pain. Poor appetite.  Objective: Vitals:  Vitals:   03/16/17 2231 03/17/17 0602  BP: (!) 112/39 (!) 121/34  Pulse: 90 79  Resp: 16 16  Temp: 98.5 F (36.9 C) 98.6 F (37 C)  SpO2: 100% 100%    Exam:  Constitutional:  . Appears calm and comfortable Eyes:  . pupils and irises appear normal . Normal lids  ENMT:  . grossly normal hearing  . Lips appear normal Respiratory:  . CTA bilaterally, no w/r/r.  . Respiratory effort normal. Cardiovascular:  . RRR, no m/r/g . No LE extremity edema    Musculoskeletal digits both hands appear unremarkable Psychiatric:  . Mental status o Mood, affect appropriate  I have personally reviewed the following:   Labs:  BMP unremarkable  Hgb stable 9.1  BC 1/2 coag negative Staph   Scheduled Meds: . collagenase   Topical BID  . feeding supplement  1 Container Oral TID BM  . feeding supplement (ENSURE ENLIVE)  237 mL Oral BID BM  . insulin aspart  0-9 Units Subcutaneous TID WC   Continuous Infusions: . sodium chloride 50 mL/hr at 03/16/17 1830  . piperacillin-tazobactam (ZOSYN)  IV Stopped (03/17/17 0930)  . vancomycin Stopped (03/17/17 0529)    Active Problems:   HTN (hypertension)   Stroke (cerebrum) ([facility]) -  Suspect stroke/TIA s/p tPA with multifocal ICH - etiology unclear, concerning for underlying CAA   Benign essential HTN   Diabetes mellitus type 2 in nonobese ([facility])   Wound infection   Skin ulcer of sacrum with necrosis of muscle ([facility])   Normochromic normocytic anemia   LOS: 4 days

## 2017-03-17 NOTE — Consult Note (Signed)
[place] Nurse wound consult note Reason for Consult: Order received for assessment of recently debrided (03/16/17, Dr. Kae Heller)  sacral and left IT wounds and evaluate for NPWT Wound type: Surgical debridement of Pressure injuries, chronic, non-healing. [place] Nurse consulted on right heel pressure injury on 03/16/17. Wound care to sacrum and left IT is complicated by urinary incontinence that is treated in acute care with a powered external urinary wicking device (PurWick).  This is not available in home care. Patient lives at home with a family caregiver.  Suggest that patient requires a higher lever of care than home care.  If you agree, please initiate discussions and consult with Care Management/Clinical Social Work.  Pressure Injury POA: Yes Measurement: Sacral Wound: 15cm x 20cm x 3cm with >60% of wound (superior aspect) covered with necrotic tissue with shredded appearance consistent with shear injury. Bony prominence easily palpated consistent with NPUAP definition of Stage 4. See photo taken by CCS PA Will Creig Hines in his note dated today. Undermining located from 11-1 o'clock measuring 2cm.  Area of deep tissue pressure injury (purple/maroon discoloration of skin) noted at 11-12 o'clock.inferior aspect of wound (40%) with red tissue (granulation). Left IT:  Total area measures 4.5cm x 7cm, wound in center measures 2.5cm x 3cm, x 1.8cm.  Unstageable as wound bed is obscured by the presence of necrotic tissue.  Surface of surrounding tissue is yellow and non-viable. Wound bed: As described above Drainage (amount, consistency, odor) Small amount serous drainage. Periwound:Intact, dry. Dressing procedure/placement/frequency: PT is consulted for provision of hydrotherapy once daily, 6 times/week for 3 weeks followed by application of an enzymatic debriding agent (collagenase/Santyl) topped with saline dressings, ABDs, paper tape.  Nursing will perform the dressing again in the pm and twice on Sundays. A  pressure redistribution chair pad is provided for OOB use.  Patient to be placed today on a mattress replacement with low air loss feature.  Sister (not the sister with whom patient lives) is present for wound assessment, dressing change and discusses long-term prognosis for tissue repair and regeneration (not likely, this is a stabilization and maintenance wound) with this Probation officer and [employer] Seems surprised, but nods understanding after learning of nutritional requirements and activity level required for wound healing. If healable, process likely to take up to and perhaps longer than 6 months.  [facility] nursing team will not follow routinely, but will remain available to this patient, the nursing, surgical and medical teams and see periodically with PT following hydrotherapy.   Thanks, Maudie Flakes, MSN, RN, [place], Arther Abbott  Pager# [phone]

## 2017-03-18 ENCOUNTER — Inpatient Hospital Stay (HOSPITAL_COMMUNITY): Payer: Medicare Other

## 2017-03-18 DIAGNOSIS — I739 Peripheral vascular disease, unspecified: Secondary | ICD-10-CM

## 2017-03-18 LAB — BPAM RBC
BLOOD PRODUCT EXPIRATION DATE: 201812312359
Blood Product Expiration Date: 201812312359
Blood Product Expiration Date: 201901062359
Blood Product Expiration Date: 201901102359
ISSUE DATE / TIME: 201812151209
ISSUE DATE / TIME: 201812151558
UNIT TYPE AND RH: 7300
Unit Type and Rh: 7300
Unit Type and Rh: 7300
Unit Type and Rh: 7300

## 2017-03-18 LAB — GLUCOSE, CAPILLARY
GLUCOSE-CAPILLARY: 116 mg/dL — AB (ref 65–99)
Glucose-Capillary: 72 mg/dL (ref 65–99)
Glucose-Capillary: 115 mg/dL — ABNORMAL HIGH (ref 65–99)
Glucose-Capillary: 135 mg/dL — ABNORMAL HIGH (ref 65–99)

## 2017-03-18 LAB — TYPE AND SCREEN
ABO/RH(D): B POS
Antibody Screen: NEGATIVE
UNIT DIVISION: 0
UNIT DIVISION: 0
UNIT DIVISION: 0
Unit division: 0

## 2017-03-18 MED ORDER — ADULT MULTIVITAMIN W/MINERALS CH
1.0000 | ORAL_TABLET | Freq: Every day | ORAL | Status: DC
  Administered 2017-03-18 – 2017-03-30 (×13): 1 via ORAL
  Filled 2017-03-18 (×13): qty 1

## 2017-03-18 NOTE — Clinical Social Work Note (Signed)
Clinical Social Work Assessment  Patient Details  Name: Julia Floyd MRN: [id number] Date of Birth: [birth year]  Date of referral:  03/18/17               Reason for consult:  Facility Placement, Discharge Planning                Permission sought to share information with:  Case Manager, Customer service manager, Family Supports Permission granted to share information::  Yes, Verbal Permission Granted  Name::     Education administrator::  SNF  Relationship::  Development worker, international aid Information:     Housing/Transportation Living arrangements for the past 2 months:  Single Family Home Source of Information:  Other (Comment Required)(Grandaughter) Patient Interpreter Needed:  None Criminal Activity/Legal Involvement Pertinent to Current Situation/Hospitalization:  No - Comment as needed Significant Relationships:  Adult Children, Other Family Members Lives with:  Adult Children Do you feel safe going back to the place where you live?  Yes Need for family participation in patient care:  Yes (Comment)  Care giving concerns:  Patient's daughter lives with her. Patient is total care. Grandduaghter reports it is difficult for her aunt to move patient.    Social Worker assessment / plan:  LCSW following for SNF placement.   Patient was admitted for Sacral osteomyelitis.  LCSW spoke with Yvetta Coder, granddaughter by phone. Granddaughter was at bedside.   According to patient's granddaughter patient is wheel chair bound and dependent for ADLs. She reports that patient uses and electric scooter at home.   Granddaughter reports that patient and family are agreeable to SNF at [place].  Patient has a pending palliative consult.   PLAN: TBD dependent upon palliative consult.   Employment status:  Retired Nurse, adult PT Recommendations:  [place] / Referral to community resources:     Patient/Family's Response to care:  Patients  family is Patent attorney of care and thankful for CSW services.   Patient/Family's Understanding of and Emotional Response to Diagnosis, Current Treatment, and Prognosis:  Family is understanding of diagnosis and agreeable to treatment plan. Patient has a pending palliative consult.   Emotional Assessment Appearance:  Appears older than stated age Attitude/Demeanor/Rapport:  Unable to Assess Affect (typically observed):  Unable to Assess Orientation:  Oriented to Self Alcohol / Substance use:  Not Applicable Psych involvement (Current and /or in the community):  No (Comment)  Discharge Needs  Concerns to be addressed:  No discharge needs identified Readmission within the last 30 days:  No Current discharge risk:  None Barriers to Discharge:  Continued Medical Work up   [employer], LCSW 03/18/2017, 4:18 PM

## 2017-03-18 NOTE — Progress Notes (Signed)
PROGRESS NOTE  Julia Floyd IRC:[id number] DOB: April 21, 1930 DOA: 03/13/2017 PCP: Josetta Huddle, MD  Brief Narrative: 33yow PMH stroke, DM, largely bedbound presented with increasing discharge from sacral wound and worsening right heel wound. CT abd/pelvis concerning fo necrotizing fasciitis. Admitted for IV abx, surgical evaluation. Lives at home with sister.   Assessment/Plan Sacral wound unstagable, osteomyelitis, acute infection. S/p OR debridement 12/17 - BC 1/2 coag negative Staph. MRSA PCR negative. - continue abx as per ID. Duration will be dictated by [place]. - per surgery goal is local wound control. Full wound-healing not anticipated given non-ambulatory status.  Right heel wound, pressure +/- ischemia. - prelim ABI: Severe arterial occlusion on right foot. Critical arterial occulusion on left foot per RN. No report available. - exam without evidence of acute ischemia. Patient was to see [facility] as outpatient last week but appt canceled secondary to snow. LE wound order set initiated. Hold off on [facility] consult given exam and unclear GOC. If aggressive tx desired after PMT involvement will consult [facility]. - continue wound care per RN   Normocytic anemia. S/p 2 units PRBC 12/15. - follow clinically.   DM type 2 - CBG remains stable.   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  - not a candidate for antiplatelets or anticoagulation.  Dementia, mixed type, Alzheimers, vascular - stable   Multiple uterine fibroids possibly causing mild left-sided hydronephrosis Incidentally noted on CT scan.  Outpatient follow-up.  Aortic atherosclerosis  - not currently on statin. Recommend outpatient f/u.  02/05/2017 Ortho Dr. Sharol Given office visit:  "Patient has significant peripheral vascular disease... Patient has thin atrophic skin to the right lower extremity there is no open ulcers no  swelling.  She does not have a palpable pulse". Follow-up in 4 weeks recommended.   Discussed with granddaughter by telephone. Patient lives with granddaughter's aunt who is unable to provide aggressive care or repositioning. SNF recommended. Began discussion re: GOC aggressive vs conservative measures. Given sacral wound with osteo unlikely to heal w/o aggressive wound care, offloading and colostomy as well was bilateral LE PVD longterm prognosis is guarded. Would not recommend diversion colostomy. PMT/GOC discussion to workout also whether intervention for BLE chronic ischemia is desirable.  DVT prophylaxis: SCDs Code Status: full Family Communication: daughter at bedside Disposition Plan: SNF vs home. Consult social work.   Murray Hodgkins, MD  [facility] Direct contact: [phone] --Via amion app OR  --[email or web address]; password [email or web address]  7PM-7AM contact night coverage as above 03/18/2017, 10:18 AM  LOS: 5 days   Consultants:  General surgery  Procedures:  2 units PRBC transfusion 12/15  12/17 Procedure performed: Incision and debridement of sacral stage IV decubitus (15x20x3cm including subcutaneous, fascia, muscle and periosteum) and left upper posterior thigh full thickness wound (3x2x2cm, subcutaneous)  Antimicrobials: 12/14 Vancomycin >>  12/14 Zosyn >> 12/15 clindamycin >>12/12  Interval history/Subjective: Some pain in backside. Otherwise feeling ok.  RN called with prelim report on bilateral ABI  Objective: Vitals:  Vitals:   03/17/17 2113 03/18/17 0543  BP: (!) 127/39 (!) 113/44  Pulse: 75 77  Resp: 18 18  Temp: 98.2 F (36.8 C) 97.8 F (36.6 C)  SpO2: 100% 100%    Exam:  Constitutional:   . Appears calm and comfortable Eyes:  . pupils and irises appear normal . Normal lids  ENMT:  . grossly normal hearing  . Lips appear normal Respiratory:  . CTA  bilaterally, no w/r/r.  . Respiratory effort normal. N Cardiovascular:  . RRR, no  m/r/g . No LE extremity edema   Musculoskeletal:  RLE, LLE   . Moves both feet to command See skin below Skin:  Right foot. Heel: large pressure/ischemic ulcer, dry; other areas on foot suggest chronic ischemia. No open wounds. Foot warm and dry. Sensation grossly intact. No pulse palpated. No cyansosi. Left foot: signs suggestive of chronic ischemia but no open wounds, no cyanosis. Foot warm and dry. Sensation intact. No pulse palpated. Psychiatric:  . Mental status o Mood, affect appropriate  I have personally reviewed the following:   Labs:  CBG stable  Record review Scheduled Meds: . collagenase   Topical BID  . feeding supplement  1 Container Oral TID BM  . feeding supplement (ENSURE ENLIVE)  237 mL Oral BID BM  . furosemide  20 mg Oral Daily  . gabapentin  100-200 mg Oral QHS  . insulin aspart  0-9 Units Subcutaneous TID WC  . linezolid  600 mg Oral Q12H   Continuous Infusions:   Principal Problem:   Sacral osteomyelitis (HCC) Active Problems:   Stroke (cerebrum) (HCC) -  Suspect stroke/TIA s/p tPA with multifocal ICH - etiology unclear, concerning for underlying CAA   Benign essential HTN   Diabetes mellitus type 2 in nonobese (HCC)   Skin ulcer of sacrum with necrosis of muscle (HCC)   Normochromic normocytic anemia   Open wound of right heel   Aortic atherosclerosis (HCC)   LOS: 5 days

## 2017-03-18 NOTE — Progress Notes (Signed)
Inpatient Diabetes Program Recommendations  AACE/ADA: New Consensus Statement on Inpatient Glycemic Control (2015)  Target Ranges:  Prepandial:   less than 140 mg/dL      Peak postprandial:   less than 180 mg/dL (1-2 hours)      Critically ill patients:  140 - 180 mg/dL   Lab Results  Component Value Date   GLUCAP 115 (H) 03/18/2017   HGBA1C 5.1 10/08/2016    Review of Glycemic Control  Diabetes history: DM2 Outpatient Diabetes medications: metformin 500 mg bid Current orders for Inpatient glycemic control: Novolog 0-9 units tidwc  HgbA1C - 5.1% on 10/08/2016 - likely not accurate with low H/H.  Inpatient Diabetes Program Recommendations:     Agree with orders. Will continue to follow while inpatient.   Thank you. Lorenda Peck, RD, LDN, CDE Inpatient Diabetes Coordinator [phone]

## 2017-03-18 NOTE — Progress Notes (Addendum)
[name] have been completed. Results were given to the patient's nurse, [place].  03/18/17 9:17 AM Julia Floyd RVT

## 2017-03-18 NOTE — Progress Notes (Signed)
RN notified by Vascular tech of ABI results. Severe arterial occlusion on right foot. Critical arterial occulusion on left foot. Md notified. Will continue to monitor patient.

## 2017-03-18 NOTE — Progress Notes (Signed)
HYDROTHERAPY TREATMENT     03/18/17 1500  Subjective Assessment  Subjective "thank you"  Patient and Family Stated Goals none stated  Date of Onset (unknown-POA)  Evaluation and Treatment  Evaluation and Treatment Procedures Explained to Patient/Family Yes  Evaluation and Treatment Procedures agreed to  Pressure Injury 03/17/17 Unstageable - Full thickness tissue loss in which the base of the ulcer is covered by slough (yellow, tan, gray, green or brown) and/or eschar (tan, brown or black) in the wound bed. *PT*  Date First Assessed/Time First Assessed: 03/17/17 1340   Location: Ischial tuberosity  Location Orientation: Left  Staging: Unstageable - Full thickness tissue loss in which the base of the ulcer is covered by slough (yellow, tan, gray, green or brown...  Dressing Type ABD;Moist to moist;Gauze  (Santyl)  Dressing Change Frequency Twice a day  State of Healing Non-healing  Site / Wound Assessment Pink;Yellow;Brown  % Wound base Red or Granulating 10%  % Wound base Yellow/Fibrinous Exudate 90%  Peri-wound Assessment Excoriated;Pink  Margins Unattached edges (unapproximated)  Treatment Hydrotherapy (Pulse lavage);Packing (Saline gauze) (Santyl)  Pressure Injury 03/17/17 Stage IV - Full thickness tissue loss with exposed bone, tendon or muscle. *PT*  S/P I&D 03/16/17  Date First Assessed/Time First Assessed: 03/17/17 1340   Location: Sacrum  Staging: Stage IV - Full thickness tissue loss with exposed bone, tendon or muscle.  Wound Description (Comments): *PT*  S/P I&D 03/16/17  Present on Admission: Yes  Dressing Type ABD;Gauze (Comment);Moist to moist (Santyl)  Dressing Changed  Dressing Change Frequency Twice a day  State of Healing Non-healing  % Wound base Red or Granulating 40%  % Wound base Black/Eschar 60%  Peri-wound Assessment (small open area in/near gluteal cleft)  Margins Unattached edges (unapproximated)  Drainage Amount Moderate  Drainage Description  Serosanguineous  Treatment Debridement (Selective);Hydrotherapy (Pulse lavage);Packing (Saline gauze) (Santyl)  Hydrotherapy  Pulsed lavage therapy - wound location L Ischial Tuberosity and Sacrum  Pulsed Lavage with Suction (psi) 8 psi  Pulsed Lavage with Suction - Normal Saline Used 1000 mL  Pulsed Lavage Tip Tip with splash shield  Selective Debridement  Selective Debridement - Location sacrum  Selective Debridement - Tools Used Forceps;Scissors  Selective Debridement - Tissue Removed brown tissue  Wound Therapy - Assess/Plan/Recommendations  Wound Therapy - Clinical Statement 83 yo female admitted from home with multiple wounds including sacral wound found to have osteomyelitis. S/P Incision and Drainage 03/16/17. Hx of CVA, dementia, DM.   Wound Therapy - Functional Problem List Limited mobility due to hx of CVA and recent fall in 09/2016 (per pt). Long periods of time spent sitting in wheelchair without pressure redistribution  Factors Delaying/Impairing Wound Healing Diabetes Mellitus;Incontinence;Immobility;Infection - systemic/local  Hydrotherapy Plan Debridement;Dressing change;Patient/family education;Pulsatile lavage with suction  Wound Therapy - Frequency 6X / week  Wound Therapy - Current Recommendations Case manager/social work  Wound Therapy - Follow Up Recommendations Skilled nursing facility  Wound Therapy Goals - Improve the function of patient's integumentary system by progressing the wound(s) through the phases of wound healing by:  Decrease Necrotic Tissue to 25%  Decrease Necrotic Tissue - Progress Progressing toward goal  Increase Granulation Tissue to 75%  Increase Granulation Tissue - Progress Progressing toward goal  Improve Drainage Characteristics Min  Improve Drainage Characteristics - Progress Progressing toward goal  Goals/treatment plan/discharge plan were made with and agreed upon by patient/family Yes  Time For Goal Achievement 2 weeks  Wound Therapy -  Potential for Goals Poor    Julia Floyd, MPT  [phone]  

## 2017-03-18 NOTE — Progress Notes (Addendum)
Nutrition Follow-up  DOCUMENTATION CODES:   Not applicable  INTERVENTION:   Ensure Enlive po BID, each supplement provides 350 kcal and 20 grams of protein  NUTRITION DIAGNOSIS:   Increased nutrient needs related to wound healing as evidenced by estimated needs.  Ongoing  GOAL:   Patient will meet greater than or equal to 90% of their needs  Not meeting  MONITOR:   PO intake, Supplement acceptance, Weight trends, Labs, I & O's  REASON FOR ASSESSMENT:   Malnutrition Screening Tool    ASSESSMENT:   83 year old African-American female with a past medical history of stroke, history of intracranial hemorrhage status post TPA, hypertension, diabetes who is largely bedbound and was brought into the hospital as patient was experiencing increasing discharge from her sacral wound.  There was also concern about right heel wound.  Evaluation in this emergency department raised concern for necrotizing fasciitis.   Pt states appetite is fair, typically consuming 25% of each meal. No meal completions charted since 12/16. Pt reports drinking Ensure twice a day, but did not receive one this morning due to diarrhea spell. This has since resolved per pt. Will monitor BM status. Discussed the importance of supplementation adherence and PO intake for wound healing. Weight noted to be up 14 lb since last RD visit 12/16. Suspect this is fluid related. Will continue with Ensure. Will add MVI for increased nutrient needs.   Medications reviewed and include: SSI, lasix Labs reviewed: Cl 112 (H)  Diet Order:  DIET DYS 2 Room service appropriate? Yes; Fluid consistency: Thin  EDUCATION NEEDS:   Not appropriate for education at this time  Skin:  Skin Assessment: Skin Integrity Issues: Skin Integrity Issues:: Unstageable, Other (Comment) Unstageable: left heel ulcer Other: sacral wound  Last BM:  03/17/17  Height:   Ht Readings from Last 1 Encounters:  03/14/17 5\' 6"  (1.676 m)    Weight:    Wt Readings from Last 1 Encounters:  03/17/17 171 lb 1.2 oz (77.6 kg)    Ideal Body Weight:  59.1 kg  BMI:  Body mass index is 27.61 kg/m.  Estimated Nutritional Needs:   Kcal:  1900-2100  Protein:  90-100g  Fluid:  2L/day    Mariana Single RD, LDN Clinical Nutrition Pager # - [phone]

## 2017-03-19 ENCOUNTER — Encounter (HOSPITAL_COMMUNITY): Payer: Medicare Other

## 2017-03-19 DIAGNOSIS — Z515 Encounter for palliative care: Secondary | ICD-10-CM

## 2017-03-19 DIAGNOSIS — Z7189 Other specified counseling: Secondary | ICD-10-CM

## 2017-03-19 DIAGNOSIS — Z5181 Encounter for therapeutic drug level monitoring: Secondary | ICD-10-CM

## 2017-03-19 DIAGNOSIS — L98499 Non-pressure chronic ulcer of skin of other sites with unspecified severity: Secondary | ICD-10-CM

## 2017-03-19 DIAGNOSIS — D72819 Decreased white blood cell count, unspecified: Secondary | ICD-10-CM

## 2017-03-19 DIAGNOSIS — D696 Thrombocytopenia, unspecified: Secondary | ICD-10-CM

## 2017-03-19 LAB — GLUCOSE, CAPILLARY
GLUCOSE-CAPILLARY: 152 mg/dL — AB (ref 65–99)
GLUCOSE-CAPILLARY: 104 mg/dL — AB (ref 65–99)
GLUCOSE-CAPILLARY: 98 mg/dL (ref 65–99)
Glucose-Capillary: 96 mg/dL (ref 65–99)

## 2017-03-19 LAB — CULTURE, BLOOD (ROUTINE X 2)
CULTURE: NO GROWTH
SPECIAL REQUESTS: ADEQUATE

## 2017-03-19 LAB — PREALBUMIN: Prealbumin: <5 mg/dL — ABNORMAL LOW (ref 18–38)

## 2017-03-19 NOTE — Progress Notes (Signed)
[facility]   Reason for visit: Follow up on  Possible osteomyelitis  Interval History: no fever, normal WBC; no associated rash  Physical Exam: Constitutional:  Vitals:   03/19/17 0526 03/19/17 1415  BP: (!) 115/48 (!) 95/30  Pulse: 80 71  Resp: 20 18  Temp: 98.8 F (37.1 C) 98.9 F (37.2 C)  SpO2: 100%    patient appears in NAD Respiratory: Normal respiratory effort; CTA B Cardiovascular: RRR  Review of Systems: Unable to be assessed due to mental status  Lab Results  Component Value Date   WBC 8.2 03/17/2017   HGB 9.1 (L) 03/17/2017   HCT 29.4 (L) 03/17/2017   MCV 85.2 03/17/2017   PLT 172 03/17/2017    Lab Results  Component Value Date   CREATININE 0.81 03/17/2017   BUN 14 03/17/2017   NA 139 03/17/2017   K 3.5 03/17/2017   CL 112 (H) 03/17/2017   CO2 24 03/17/2017    Lab Results  Component Value Date   ALT 9 (L) 03/14/2017   AST 21 03/14/2017   ALKPHOS 49 03/14/2017     Microbiology: Recent Results (from the past 240 hour(s))  Blood culture (routine x 2)     Status: None   Collection Time: 03/13/17  9:14 PM  Result Value Ref Range Status   Specimen Description BLOOD LEFT FOREARM  Final   Special Requests   Final    BOTTLES DRAWN AEROBIC AND ANAEROBIC Blood Culture adequate volume   Culture   Final    NO GROWTH 5 DAYS Performed at [facility], [address] [place], [place] [address]    Report Status 03/19/2017 FINAL  Final  Blood culture (routine x 2)     Status: Abnormal   Collection Time: 03/13/17 11:56 PM  Result Value Ref Range Status   Specimen Description BLOOD RIGHT HAND  Final   Special Requests IN PEDIATRIC BOTTLE Blood Culture adequate volume  Final   Culture  Setup Time   Final    GRAM POSITIVE COCCI IN CLUSTERS IN PEDIATRIC BOTTLE CRITICAL RESULT CALLED TO, READ BACK BY AND VERIFIED WITH: E JACKSON,PHARMD AT 8527 03/15/17 BY L BENFIELD    Culture (A)  Final    STAPHYLOCOCCUS SPECIES  (COAGULASE NEGATIVE) THE SIGNIFICANCE OF ISOLATING THIS ORGANISM FROM A SINGLE SET OF BLOOD CULTURES WHEN MULTIPLE SETS ARE DRAWN IS UNCERTAIN. PLEASE NOTIFY THE MICROBIOLOGY DEPARTMENT WITHIN ONE WEEK IF SPECIATION AND SENSITIVITIES ARE REQUIRED. Performed at [facility], [place] [address], [place], [place] [address]    Report Status 03/17/2017 FINAL  Final  Blood Culture ID Panel (Reflexed)     Status: Abnormal   Collection Time: 03/13/17 11:56 PM  Result Value Ref Range Status   Enterococcus species NOT DETECTED NOT DETECTED Final   Listeria monocytogenes NOT DETECTED NOT DETECTED Final   Staphylococcus species DETECTED (A) NOT DETECTED Final    Comment: Methicillin (oxacillin) resistant coagulase negative staphylococcus. Possible blood culture contaminant (unless isolated from more than one blood culture draw or clinical case suggests pathogenicity). No antibiotic treatment is indicated for blood  culture contaminants. CRITICAL RESULT CALLED TO, READ BACK BY AND VERIFIED WITH: E JACKSON,PHARMD AT 3536 03/15/17 BY L BENFIELD    Staphylococcus aureus NOT DETECTED NOT DETECTED Final   Methicillin resistance DETECTED (A) NOT DETECTED Final    Comment: CRITICAL RESULT CALLED TO, READ BACK BY AND VERIFIED WITH: E JACKSON,PHARMD AT 1443 03/15/17 BY L BENFIELD  Streptococcus species NOT DETECTED NOT DETECTED Final   Streptococcus agalactiae NOT DETECTED NOT DETECTED Final   Streptococcus pneumoniae NOT DETECTED NOT DETECTED Final   Streptococcus pyogenes NOT DETECTED NOT DETECTED Final   Acinetobacter baumannii NOT DETECTED NOT DETECTED Final   Enterobacteriaceae species NOT DETECTED NOT DETECTED Final   Enterobacter cloacae complex NOT DETECTED NOT DETECTED Final   Escherichia coli NOT DETECTED NOT DETECTED Final   Klebsiella oxytoca NOT DETECTED NOT DETECTED Final   Klebsiella pneumoniae NOT DETECTED NOT DETECTED Final   Proteus species NOT DETECTED NOT DETECTED Final   Serratia  marcescens NOT DETECTED NOT DETECTED Final   Haemophilus influenzae NOT DETECTED NOT DETECTED Final   Neisseria meningitidis NOT DETECTED NOT DETECTED Final   Pseudomonas aeruginosa NOT DETECTED NOT DETECTED Final   Candida albicans NOT DETECTED NOT DETECTED Final   Candida glabrata NOT DETECTED NOT DETECTED Final   Candida krusei NOT DETECTED NOT DETECTED Final   Candida parapsilosis NOT DETECTED NOT DETECTED Final   Candida tropicalis NOT DETECTED NOT DETECTED Final    Comment: Performed at [facility], [place] [address] [place], [place] [address]  MRSA PCR Screening     Status: None   Collection Time: 03/16/17  1:37 PM  Result Value Ref Range Status   MRSA by PCR NEGATIVE NEGATIVE Final    Comment:        The GeneXpert MRSA Assay (FDA approved for NASAL specimens only), is one component of a comprehensive MRSA colonization surveillance program. It is not intended to diagnose MRSA infection nor to guide or monitor treatment for MRSA infections.     Impression/Plan:  1. Possible osteomyelitis - deep ulcer, some bone destruction on CT.  There is no indication from my standpoint for treatment without associated aggressive wound care, nutrition and diversion as also outlined by surgery.  Therefore, I recommend two weeks of oral linezolid due to the necrosis, cellulitis and then stop.  2. Medication monitoring - she will need CBC twice a week on linezolid to monitor for leukopenia and thrombocytopenia.    3.  Palliation - I discussed above with the daughter at the bedside and I feel aggressive care will not improve her situation and should focus on comfort for her.  I discussed the poor vascular system for her legs as well and overall prognosis.  Palliative care to see.    At this time, I will sign off, please call for any changes.

## 2017-03-19 NOTE — Progress Notes (Signed)
LCSW following for SNF placement.  Patient and family would like Julia Floyd at [facility].   Carolin Coy [facility]

## 2017-03-19 NOTE — Progress Notes (Signed)
HYDROTHERAPY TREATMENT     03/19/17 1500  Subjective Assessment  Subjective "Okay"  Patient and Family Stated Goals none stated  Date of Onset (unknown-POA)  Evaluation and Treatment  Evaluation and Treatment Procedures Explained to Patient/Family Yes  Evaluation and Treatment Procedures agreed to  Pressure Injury 03/17/17 Unstageable - Full thickness tissue loss in which the base of the ulcer is covered by slough (yellow, tan, gray, green or brown) and/or eschar (tan, brown or black) in the wound bed. *PT*  Date First Assessed/Time First Assessed: 03/17/17 1340   Location: Ischial tuberosity  Location Orientation: Left  Staging: Unstageable - Full thickness tissue loss in which the base of the ulcer is covered by slough (yellow, tan, gray, green or brown...  Dressing Type ABD;Barrier Film (skin prep);Moist to moist Santyl  Dressing Changed  Dressing Change Frequency Twice a day  State of Healing Non-healing  Site / Wound Assessment Pink;Yellow;Black  % Wound base Red or Granulating 40%  % Wound base Yellow/Fibrinous Exudate 60%  Peri-wound Assessment Excoriated;Pink  Margins Unattached edges (unapproximated)  Drainage Amount Minimal  Drainage Description Serosanguineous  Treatment Debridement (Selective);Hydrotherapy (Pulse lavage);Packing (Saline gauze) Santyl  Pressure Injury 03/17/17 Stage IV - Full thickness tissue loss with exposed bone, tendon or muscle. *PT*  S/P I&D 03/16/17  Date First Assessed/Time First Assessed: 03/17/17 1340   Location: Sacrum  Staging: Stage IV - Full thickness tissue loss with exposed bone, tendon or muscle.  Wound Description (Comments): *PT*  S/P I&D 03/16/17  Present on Admission: Yes  Dressing Type ABD;Gauze (Comment);Moist to moist;Barrier Film (skin prep) Santyl  Dressing Changed  Dressing Change Frequency Twice a day  State of Healing Non-healing  Site / Wound Assessment Pink;Red;Yellow;Brown  % Wound base Red or Granulating 45%  % Wound  base Black/Eschar 55%  Peri-wound Assessment (small open area in gluteal fold)  Margins Unattached edges (unapproximated)  Drainage Amount Moderate  Drainage Description Serosanguineous  Treatment Debridement (Selective);Hydrotherapy (Pulse lavage);Packing (Saline gauze)  Hydrotherapy  Pulsed lavage therapy - wound location L Ischial Tuberosity and Sacrum  Pulsed Lavage with Suction (psi) 8 psi  Pulsed Lavage with Suction - Normal Saline Used 1000 mL  Pulsed Lavage Tip Tip with splash shield  Selective Debridement  Selective Debridement - Location sacrum  Selective Debridement - Tools Used Forceps;Scissors  Selective Debridement - Tissue Removed brown tissue  Wound Therapy - Assess/Plan/Recommendations  Wound Therapy - Clinical Statement 83 yo female admitted from home with multiple wounds including sacral wound found to have osteomyelitis. S/P Incision and Drainage 03/16/17. Hx of CVA, dementia, DM.   Wound Therapy - Functional Problem List Limited mobility due to hx of CVA and recent fall in 09/2016 (per pt). Long periods of time spent sitting in wheelchair without pressure redistribution  Factors Delaying/Impairing Wound Healing Diabetes Mellitus;Incontinence;Immobility;Infection - systemic/local  Hydrotherapy Plan Debridement;Dressing change;Patient/family education;Pulsatile lavage with suction  Wound Therapy - Frequency 6X / week  Wound Therapy - Current Recommendations Case manager/social work  Wound Therapy - Follow Up Recommendations Skilled nursing facility  Wound Plan Continue hydrotherapy, selective and enzymatic debridement. Pt had 2 loose stools during session on today.   Wound Therapy Goals - Improve the function of patient's integumentary system by progressing the wound(s) through the phases of wound healing by:  Decrease Necrotic Tissue to 25%  Decrease Necrotic Tissue - Progress Progressing toward goal  Increase Granulation Tissue to 75%  Increase Granulation Tissue -  Progress Progressing toward goal  Improve Drainage Characteristics Min  Improve Drainage  Characteristics - Progress Progressing toward goal  Goals/treatment plan/discharge plan were made with and agreed upon by patient/family Yes  Time For Goal Achievement 2 weeks  Wound Therapy - Potential for Goals Poor    Julia Floyd, MPT [phone]

## 2017-03-19 NOTE — Progress Notes (Signed)
3 Days Post-Op    CC:  Sacral decubitus  Subjective: Ongoing dressing changes, pt has Julia Floyd in place and is incontinent of stool.    Objective: Vital signs in last 24 hours: Temp:  [97.3 F (36.3 C)-98.8 F (37.1 C)] 98.8 F (37.1 C) (12/20 0526) Pulse Rate:  [71-80] 80 (12/20 0526) Resp:  [18-20] 20 (12/20 0526) BP: (115-119)/(40-48) 115/48 (12/20 0526) SpO2:  [100 %] 100 % (12/20 0526) Last BM Date: 03/17/17  Intake/Output from previous day: 12/19 0701 - 12/20 0700 In: 240 [P.O.:240] Out: -  Intake/Output this shift: Total I/O In: 360 [P.O.:360] Out: -   General appearance: alert, cooperative and no distress Skin: see picture below   50-60% of the wound is brown non viable muscle and it is over the bone.  She has sensation and even gentle washing around it with a wash cloth is uncomfortable to her.    Lab Results:  Recent Labs    03/17/17 0531  WBC 8.2  HGB 9.1*  HCT 29.4*  PLT 172    BMET Recent Labs    03/17/17 0531  NA 139  K 3.5  CL 112*  CO2 24  GLUCOSE 107*  BUN 14  CREATININE 0.81  CALCIUM 8.3*   PT/INR No results for input(s): LABPROT, INR in the last 72 hours.  Recent Labs  Lab 03/13/17 2104 03/14/17 0555  AST 36 21  ALT 11* 9*  ALKPHOS 53 49  BILITOT 1.3* 0.6  PROT 7.0 6.3*  ALBUMIN 2.3* 2.0*     Lipase  No results found for: LIPASE   Medications: . collagenase   Topical BID  . feeding supplement (ENSURE ENLIVE)  237 mL Oral BID BM  . furosemide  20 mg Oral Daily  . gabapentin  100-200 mg Oral QHS  . insulin aspart  0-9 Units Subcutaneous TID WC  . linezolid  600 mg Oral Q12H  . multivitamin with minerals  1 tablet Oral Daily    Anti-infectives (From admission, onward)   Start     Dose/Rate Route Frequency Ordered Stop   03/17/17 1515  linezolid (ZYVOX) tablet 600 mg     600 mg Oral Every 12 hours 03/17/17 1509     03/16/17 1421  piperacillin-tazobactam (ZOSYN) 3.375 GM/50ML IVPB    Comments:  [place] Session   :  cabinet override      03/16/17 1421 03/16/17 1549   03/15/17 0400  vancomycin (VANCOCIN) IVPB 750 mg/150 ml premix  Status:  Discontinued     750 mg 150 mL/hr over 60 Minutes Intravenous Every 24 hours 03/14/17 0111 03/17/17 1509   03/14/17 0800  clindamycin (CLEOCIN) IVPB 900 mg  Status:  Discontinued     900 mg 100 mL/hr over 30 Minutes Intravenous Every 8 hours 03/14/17 0216 03/14/17 1407   03/14/17 0600  piperacillin-tazobactam (ZOSYN) IVPB 3.375 g  Status:  Discontinued     3.375 g 12.5 mL/hr over 240 Minutes Intravenous Every 8 hours 03/14/17 0247 03/17/17 1509   03/13/17 2345  vancomycin (VANCOCIN) 1,500 mg in sodium chloride 0.9 % 500 mL IVPB     1,500 mg 250 mL/hr over 120 Minutes Intravenous  Once 03/13/17 2332 03/14/17 0305   03/13/17 2330  clindamycin (CLEOCIN) IVPB 600 mg     600 mg 100 mL/hr over 30 Minutes Intravenous  Once 03/13/17 2327 03/14/17 0045   03/13/17 2330  piperacillin-tazobactam (ZOSYN) IVPB 3.375 g     3.375 g 100 mL/hr over 30 Minutes Intravenous  Once  03/13/17 2327 03/14/17 0045      Assessment/Plan Sacral Decubitus Incision and debridement of sacral stage IV decubitus (15x20x3cm including subcutaneous, fascia, muscle and periosteum) and left upper posterior thigh full thickness wound (3x2x2cm, subcutaneous)  Wheelchair bound/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  I told them healing completely was unlikely with her bedbound and wheelchair bound. That she would most likely require a diverting ostomy, long term antibiotic treatment for her osteomyelitis, and then a muscle  flap/perhaps a skin graft to heal the site under good circumstances.   We will continue wound care and Dr. Kae Heller will review.  Dr. Florene Glen notes Palliative is coming to see her also.          LOS: 6 days    Julia Floyd 03/19/2017 [phone]

## 2017-03-19 NOTE — Consult Note (Signed)
Consultation Note Date: 03/19/2017   Patient Name: Julia Floyd  DOB: 1930-04-03  MRN: [id number]  Age / Sex: 83 y.o., female  PCP: Josetta Huddle, MD Referring Physician: Elodia Florence., *  Reason for Consultation: Establishing goals of care  HPI/Patient Profile: 83 y.o. female  with past medical history of stroke and cranial hemorrhage status post TPA, hypertension, diabetes, largely bedbound admitted on 03/13/2017 with increasing discharge from a large sacral wound and worsening right heel wound.  Surgery has been following and she is status post debridement.  Palliative consulted for goals of care.  Clinical Assessment and Goals of Care: I met today with Julia Floyd.  She reports the most important thing to her is her faith.  She also reports her family is very important to her.  She has 7 children.  She currently lives with 1 of her children, Julia Floyd. She reports that in addition to the family and her faith she enjoys gardening.    She reports that the physicians have been doing a job explaining things to her, however, when asked to elaborate on what they have been talking with her about, she has difficulty relating any information given to her.  She is clear that her ultimate goal is to get back to her home that she shares with her daughter.  I called and spoke with her granddaughter and daughter on the phone.  We briefly reviewed her clinical course and discussed that she has complex wounds that are further complicated by her multiple comorbid conditions.  Family reports that they would like to meet in order to have goals of care conversation but are not able to meet until Saturday of this week.  We have set up a family meeting for noon on Saturday.  SUMMARY OF RECOMMENDATIONS   - Began discussion regarding goals of care.   - She has a large family support system.  We are planning for  f/u meeting to continue Julia Floyd discussion on Saturday at 1200.   Psycho-social/Spiritual:   Desire for further Chaplaincy support:Did not address today  Additional Recommendations: Education on Hospice  Prognosis:   guarded  Discharge Planning: To Be Determined      Primary Diagnoses: Present on Admission: . (Resolved) Wound infection . Stroke (cerebrum) ([place]) -  Suspect stroke/TIA s/p tPA with multifocal ICH - etiology unclear, concerning for underlying CAA . Skin ulcer of sacrum with necrosis of muscle ([place]) . (Resolved) HTN (hypertension) . Benign essential HTN . Normochromic normocytic anemia   I have reviewed the medical record, interviewed the patient and family, and examined the patient. The following aspects are pertinent.  Past Medical History:  Diagnosis Date  . DDD (degenerative disc disease)   . Depression   . Diabetes ([place])   . HTN (hypertension)   . Psychotic disorder ([place])    secondary to the below factors. 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 Bipolar disorder, most likely type 3, this episode being manic  . Sixth cranial nerve palsy   . Stroke (  [facility])   . Trigeminal neuralgia   . Uterine fibroid    Social History   Socioeconomic History  . Marital status: Widowed    Spouse name: None  . Number of children: None  . Years of education: None  . Highest education level: None  Social Needs  . Financial resource strain: None  . Food insecurity - worry: None  . Food insecurity - inability: None  . Transportation needs - medical: None  . Transportation needs - non-medical: None  Occupational History  . None  Tobacco Use  . Smoking status: Former Research scientist (life sciences)  . Smokeless tobacco: Never Used  Substance and Sexual Activity  . Alcohol use: No  . Drug use: No  . Sexual activity: No  Other Topics Concern  . None  Social History Narrative  . None   Family History  Problem Relation Age of Onset  . Heart attack Unknown   . Diabetes  Unknown    Scheduled Meds: . collagenase   Topical BID  . feeding supplement (ENSURE ENLIVE)  237 mL Oral BID BM  . furosemide  20 mg Oral Daily  . gabapentin  100-200 mg Oral QHS  . insulin aspart  0-9 Units Subcutaneous TID WC  . linezolid  600 mg Oral Q12H  . multivitamin with minerals  1 tablet Oral Daily   Continuous Infusions: PRN Meds:.acetaminophen **OR** acetaminophen, ondansetron **OR** ondansetron (ZOFRAN) IV Medications Prior to Admission:  Prior to Admission medications   Medication Sig Start Date End Date Taking? Authorizing Provider  amLODipine (NORVASC) 10 MG tablet Take 1 tablet (10 mg total) by mouth daily. 10/29/16  Yes Angiulli, Lavon Paganini, PA-C  cyanocobalamin 500 MCG tablet Take 1 tablet (500 mcg total) by mouth daily. Vitamin B12 10/29/16  Yes Angiulli, Lavon Paganini, PA-C  cyclobenzaprine (FLEXERIL) 5 MG tablet Take 1 tablet (5 mg total) 3 (three) times daily as needed by mouth for muscle spasms. 02/16/17  Yes Davonna Belling, MD  furosemide (LASIX) 20 MG tablet Take 1 tablet (20 mg total) by mouth daily. 10/29/16  Yes Angiulli, Lavon Paganini, PA-C  gabapentin (NEURONTIN) 100 MG capsule Take 1-2 capsules (100-200 mg total) by mouth at bedtime. 10/29/16  Yes Angiulli, Lavon Paganini, PA-C  metFORMIN (GLUCOPHAGE) 500 MG tablet Take 1 tablet (500 mg total) by mouth 2 (two) times daily with a meal. 10/29/16  Yes Angiulli, Lavon Paganini, PA-C  Omega-3 Fatty Acids (FISH OIL PO) Take 1 capsule daily by mouth.   Yes [provider]  VITAMIN D, CHOLECALCIFEROL, PO Take 1 capsule daily by mouth.   Yes [provider]  oxyCODONE-acetaminophen (PERCOCET) 5-325 MG tablet Take 0.5 tablets by mouth every 4 (four) hours as needed. Patient not taking: Reported on 02/16/2017 01/14/17   Long, Wonda Olds, MD  pravastatin (PRAVACHOL) 40 MG tablet Take 1 tablet (40 mg total) by mouth daily at 6 PM. Patient not taking: Reported on 02/16/2017 10/29/16   Angiulli, Lavon Paganini, PA-C   No Known  Allergies Review of Systems  Constitutional: Positive for activity change and fatigue.  Musculoskeletal: Positive for back pain and neck pain.  Neurological: Positive for weakness.  Psychiatric/Behavioral: Positive for sleep disturbance.   Physical Exam General: Alert, awake, in no acute distress.  HEENT: No bruits, no goiter, no JVD Heart: Regular rate and rhythm. No murmur appreciated. Lungs: Good air movement, clear Abdomen: Soft, nontender, nondistended, positive bowel sounds.  Ext: Boots in place Skin: Wounds not examined.   Vital Signs: BP (!) 128/40 (BP Location:  Left Arm)   Pulse 76   Temp 98.9 F (37.2 C) (Oral)   Resp 18   Ht _0  (1.676 m)   Wt 77.6 kg (171 lb 1.2 oz)   SpO2 100%   BMI 27.61 kg/m  Pain Assessment: No/denies pain   Pain Score: 0-No pain   SpO2: SpO2: 100 % O2 Device:SpO2: 100 % O2 Flow Rate: .O2 Flow Rate (L/min): 2 L/min  IO: Intake/output summary:   Intake/Output Summary (Last 24 hours) at 03/19/2017 2151 Last data filed at 03/19/2017 1816 Gross per 24 hour  Intake 720 ml  Output -  Net 720 ml    LBM: Last BM Date: 03/17/17 Baseline Weight: Weight: 73 kg (161 lb) Most recent weight: Weight: 77.6 kg (171 lb 1.2 oz)     Palliative Assessment/Data:   Flowsheet Rows     Most Recent Value  Intake Tab  Referral Department  Hospitalist  Unit at Time of Referral  Med/Surg Unit  Palliative Care Primary Diagnosis  Sepsis/Infectious Disease  Date Notified  03/18/17  Palliative Care Type  New Palliative care  Reason for referral  Clarify Goals of Care  Date of Admission  03/13/17  Date first seen by Palliative Care  03/19/17  # of days Palliative referral response time  1 Day(s)  # of days IP prior to Palliative referral  5  Clinical Assessment  Palliative Performance Scale Score  50%  Psychosocial & Spiritual Assessment  Palliative Care Outcomes      Time In: 1900 Time Out: 1955 Time Total: 55 Greater than 50%  of this  time was spent counseling and coordinating care related to the above assessment and plan.  Signed by: Micheline Rough, MD   Please contact Palliative Medicine Team phone at [phone] for questions and concerns.  For individual provider: See Shea Evans

## 2017-03-19 NOTE — Progress Notes (Signed)
Physical Therapy Treatment Patient Details Name: Julia Floyd MRN: [id number] DOB: 1930/06/04 Today's Date: 03/19/2017    History of Present Illness 83 yo female admitted with multiple wounds. Sacral wound found to have osteomyelitis-s/p I&D 03/16/17. Hx of CVA, dementia, R ankle dislocation/fx 12/2016    PT Comments    Pt with multiple loose stools during session. Deferred OOB activity. Total assist for hygiene.    Follow Up Recommendations  SNF     Equipment Recommendations  None recommended by PT    Recommendations for Other Services       Precautions / Restrictions Precautions Precautions: Fall Restrictions Weight Bearing Restrictions: No    Mobility  Bed Mobility Overal bed mobility: Needs Assistance Bed Mobility: Rolling Rolling: Max assist         General bed mobility comments: Rolling x 4 this session. Pt with multiple loose stools during session. Deferred OOB activity. Total assist for hygiene  Transfers                    Ambulation/Gait                 Stairs            Wheelchair Mobility    Modified Rankin (Stroke Patients Only)       Balance                                            Cognition Arousal/Alertness: Awake/alert Behavior During Therapy: WFL for tasks assessed/performed Overall Cognitive Status: History of cognitive impairments - at baseline                                        Exercises      General Comments        Pertinent Vitals/Pain Pain Assessment: No/denies pain    Home Living                      Prior Function            PT Goals (current goals can now be found in the care plan section) Progress towards PT goals: Progressing toward goals    Frequency    Min 2X/week      PT Plan Current plan remains appropriate    Co-evaluation              AM-PAC PT "6 Clicks" Daily Activity  Outcome Measure  Difficulty  turning over in bed (including adjusting bedclothes, sheets and blankets)?: Unable Difficulty moving from lying on back to sitting on the side of the bed? : Unable Difficulty sitting down on and standing up from a chair with arms (e.g., wheelchair, bedside commode, etc,.)?: Unable Help needed moving to and from a bed to chair (including a wheelchair)?: Total Help needed walking in hospital room?: Total Help needed climbing 3-5 steps with a railing? : Total 6 Click Score: 6    End of Session   Activity Tolerance: Patient tolerated treatment well Patient left: in bed;with call bell/phone within reach;with bed alarm set   PT Visit Diagnosis: History of falling (Z91.81);Muscle weakness (generalized) (M62.81);Other abnormalities of gait and mobility (R26.89)     Time: 1324-4010 PT Time Calculation (min) (ACUTE ONLY): 8 min  Charges:  $Therapeutic Activity:  8-22 mins                    G Codes:          Weston Anna, MPT Pager: [phone]

## 2017-03-19 NOTE — Progress Notes (Addendum)
PROGRESS NOTE    Julia Floyd  HCW:[id number] DOB: [birth year] DOA: 03/13/2017 PCP: Josetta Huddle, MD   Brief Narrative:  57yow PMH stroke, DM, largely bedbound presented with increasing discharge from sacral wound and worsening right heel wound. CT abd/pelvis concerning fo necrotizing fasciitis. Admitted for IV abx, surgical evaluation. Lives at home with sister.  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 S/p OR debridement 12/17 - BC 1/2 coag negative Staph. MRSA PCR negative. - continue abx as per ID. Duration will be dictated by [place]. - per surgery goal is local wound control. Full wound-healing not anticipated given non-ambulatory status.  Right heel wound, pressure +/- ischemia. - ABI: Severe arterial occlusion on right foot. Critical arterial occulusion on left foot.  - at this point in time, awaiting [place] discussion prior to [facility] consult - continue wound care per RN   Normocytic anemia. S/p 2 units PRBC 12/15. - follow clinically.   DM type 2 - CBG remains stable.   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  - not a candidate for antiplatelets or anticoagulation.  Dementia, mixed type, Alzheimers, vascular - stable   Multiple uterine fibroids possibly causing mild left-sided hydronephrosis Incidentally noted on CT scan. Outpatient follow-up.  Aortic atherosclerosis  - not  currently on statin. Recommend outpatient f/u.  02/05/2017 Ortho Dr. Sharol Given office visit:  "Patient has significant peripheral vascular disease... Patient has thin atrophic skin to the right lower extremity there is no open ulcers no swelling. She does not have a palpable pulse". Follow-up in 4 weeks recommended.  Dr. Sarajane Jews began discussion regarding [place] previously. "Given sacral wound with osteo unlikely to heal w/o aggressive wound care, offloading and colostomy as well was bilateral LE PVD longterm prognosis is guarded. Would not recommend diversion colostomy. PMT/GOC discussion to workout also whether intervention for BLE chronic ischemia is desirable".  DVT prophylaxis: SCD Code Status: full  Family Communication: daughter at bedside Disposition Plan: pending [place] discussion   Consultants:   ID, surgery, palliative  Procedures: (Don't include imaging studies which can be auto populated. Include things that cannot be auto populated i.e. Echo, Carotid and venous dopplers, Foley, Bipap, HD, tubes/drains, wound vac, central lines etc)  2 units pRBC 12/15  12/17 Incision and debridement of sacral stage IV decubitus (15x20x3cm including subcutaneous, fascia, muscle and periosteum) and left upper posterior thigh full thickness wound (3x2x2cm, subcutaneous)  Antimicrobials: (specify start and planned stop date. Auto populated tables are space occupying and do not give end dates) 12/14 Vancomycin >> 12/14 Zosyn >> 12/15 clindamycin >>12/12  Subjective: a&ox2-3 (notes cone, can't specify WL) No CP or SOB.   Per nurse, more alert today than yesterday, looks better overall.   Objective: Vitals:   03/18/17 0543 03/18/17 1501 03/18/17 2111 03/19/17 0526  BP: (!) 113/44 (!) 118/40 (!) 119/44 (!) 115/48  Pulse: 77 71 78 80  Resp: 18 18 18 20   Temp:  97.8 F (36.6 C) (!) 97.3 F (36.3 C) 98.6 F (37 C) 98.8 F (37.1 C)  TempSrc: Oral Axillary Oral Oral  SpO2: 100% 100% 100% 100%    Weight:      Height:        Intake/Output Summary (Last 24 hours) at 03/19/2017 1140 Last data filed at 03/19/2017 0852 Gross per 24 hour  Intake 600 ml  Output -  Net 600 ml   Filed Weights   03/15/17 0613 03/16/17 0622 03/17/17 0602  Weight: 72.1 kg (158 lb 15.2 oz) 76.5 kg (168 lb 10.4 oz) 77.6 kg (171 lb 1.2 oz)    Examination:  General exam: Appears calm and comfortable  Respiratory system: Clear to auscultation. Respiratory effort normal. Cardiovascular system: S1 & S2 heard, RRR. No JVD, murmurs, rubs, gallops or clicks. No pedal edema. Gastrointestinal system: Abdomen is nondistended, soft and nontender. No organomegaly or masses felt. Normal bowel sounds heard. Central nervous system: Alert and oriented x2-3. No focal neurological deficits. Extremities: difficult to appreciate pedal pulses.  Skin: large sacral decub ulcer.  Bilateral LE in boots. Psychiatry: Judgement and insight appear normal. Mood & affect appropriate.     Data Reviewed: I have personally reviewed following labs and imaging studies  CBC: Recent Labs  Lab 03/13/17 2104 03/14/17 0555 03/15/17 0602 03/16/17 0519 03/17/17 0531  WBC 13.6* 11.5* 10.4 9.7 8.2  NEUTROABS 10.8* 8.7*  --   --   --   HGB 7.6* 6.5* 9.4* 9.8* 9.1*  HCT 24.0* 21.0* 29.2* 31.1* 29.4*  MCV 85.7 87.9 84.4 84.3 85.2  PLT 262 220 200 196 628   Basic Metabolic Panel: Recent Labs  Lab 03/13/17 2104 03/14/17 0555 03/15/17 0602 03/16/17 0519 03/17/17 0531  NA 136 138 137 138 139  K 5.4* 4.2 3.8 3.7 3.5  CL 105 107 108 109 112*  CO2 24 25 22 24 24   GLUCOSE 144* 104* 102* 98 107*  BUN 27* 24* 16 16 14   CREATININE 1.03* 0.98 0.82 0.92 0.81  CALCIUM 8.5* 8.0* 8.1* 8.3* 8.3*   GFR: Estimated Creatinine Clearance: 54.4 mL/min (by C-G formula based on SCr of 0.81 mg/dL). Liver Function Tests: Recent Labs  Lab 03/13/17 2104 03/14/17 0555  AST 36 21  ALT 11* 9*  ALKPHOS 53 49  BILITOT 1.3* 0.6  PROT 7.0 6.3*   ALBUMIN 2.3* 2.0*   No results for input(s): LIPASE, AMYLASE in the last 168 hours. No results for input(s): AMMONIA in the last 168 hours. Coagulation Profile: No results for input(s): INR, PROTIME in the last 168 hours. Cardiac Enzymes: No results for input(s): CKTOTAL, CKMB, CKMBINDEX, TROPONINI in the last 168 hours. BNP (last 3 results) No results for input(s): PROBNP in the last 8760 hours. HbA1C: No results for input(s): HGBA1C in the last 72 hours. CBG: Recent Labs  Lab 03/18/17 1136 03/18/17 1647 03/18/17 2111 03/19/17 0720 03/19/17 1121  GLUCAP 115* 116* 135* 96 152*   Lipid Profile: No results for input(s): CHOL, HDL, LDLCALC, TRIG, CHOLHDL, LDLDIRECT in the last 72 hours. Thyroid Function Tests: No results for input(s): TSH, T4TOTAL, FREET4, T3FREE, THYROIDAB in the last 72 hours. Anemia Panel: No results for input(s): VITAMINB12, FOLATE, FERRITIN, TIBC, IRON, RETICCTPCT in the last 72 hours. Sepsis Labs: Recent Labs  Lab 03/13/17 2123  LATICACIDVEN 1.47    Recent Results (from the past 240 hour(s))  Blood culture (routine x 2)     Status: None (Preliminary result)   Collection Time: 03/13/17  9:14 PM  Result Value Ref Range Status   Specimen Description BLOOD LEFT FOREARM  Final   Special Requests   Final    BOTTLES DRAWN AEROBIC AND ANAEROBIC Blood Culture adequate volume   Culture   Final    NO GROWTH 4 DAYS Performed at [facility], [address], [place], [place] [address]    Report Status PENDING  Incomplete  Blood culture (routine x 2)     Status: Abnormal   Collection Time: 03/13/17 11:56 PM  Result Value Ref Range Status   Specimen Description BLOOD RIGHT HAND  Final   Special Requests IN PEDIATRIC BOTTLE Blood Culture adequate volume  Final   Culture  Setup Time   Final    GRAM POSITIVE COCCI IN CLUSTERS IN PEDIATRIC BOTTLE CRITICAL RESULT CALLED TO, READ BACK BY AND VERIFIED WITH: E JACKSON,PHARMD AT 3976 03/15/17 BY L BENFIELD     Culture (A)  Final    STAPHYLOCOCCUS SPECIES (COAGULASE NEGATIVE) THE SIGNIFICANCE OF ISOLATING THIS ORGANISM FROM A SINGLE SET OF BLOOD CULTURES WHEN MULTIPLE SETS ARE DRAWN IS UNCERTAIN. PLEASE NOTIFY THE MICROBIOLOGY DEPARTMENT WITHIN ONE WEEK IF SPECIATION AND SENSITIVITIES ARE REQUIRED. Performed at [facility], [place] [address] [place], [place] [address]    Report Status 03/17/2017 FINAL  Final  Blood Culture ID Panel (Reflexed)     Status: Abnormal   Collection Time: 03/13/17 11:56 PM  Result Value Ref Range Status   Enterococcus species NOT DETECTED NOT DETECTED Final   Listeria monocytogenes NOT DETECTED NOT DETECTED Final   Staphylococcus species DETECTED (A) NOT DETECTED Final    Comment: Methicillin (oxacillin) resistant coagulase negative staphylococcus. Possible blood culture contaminant (unless isolated from more than one blood culture draw or clinical case suggests pathogenicity). No antibiotic treatment is indicated for blood  culture contaminants. CRITICAL RESULT CALLED TO, READ BACK BY AND VERIFIED WITH: E JACKSON,PHARMD AT 3790 03/15/17 BY L BENFIELD    Staphylococcus aureus NOT DETECTED NOT DETECTED Final   Methicillin resistance DETECTED (A) NOT DETECTED Final    Comment: CRITICAL RESULT CALLED TO, READ BACK BY AND VERIFIED WITH: E JACKSON,PHARMD AT 0714 03/15/17 BY L BENFIELD    Streptococcus species NOT DETECTED NOT DETECTED Final   Streptococcus agalactiae NOT DETECTED NOT DETECTED Final   Streptococcus pneumoniae NOT DETECTED NOT DETECTED Final   Streptococcus pyogenes NOT DETECTED NOT DETECTED Final   Acinetobacter baumannii NOT DETECTED NOT DETECTED Final   Enterobacteriaceae species NOT DETECTED NOT DETECTED Final   Enterobacter cloacae complex NOT DETECTED NOT DETECTED Final   Escherichia coli NOT DETECTED NOT DETECTED Final   Klebsiella oxytoca NOT DETECTED NOT DETECTED Final   Klebsiella pneumoniae NOT DETECTED NOT DETECTED Final   Proteus  species NOT DETECTED NOT DETECTED Final   Serratia marcescens NOT DETECTED NOT DETECTED Final   Haemophilus influenzae NOT DETECTED NOT DETECTED Final   Neisseria meningitidis NOT DETECTED NOT DETECTED Final   Pseudomonas aeruginosa NOT DETECTED NOT DETECTED Final   Candida albicans NOT DETECTED NOT DETECTED Final   Candida glabrata NOT DETECTED NOT DETECTED Final   Candida krusei NOT DETECTED NOT DETECTED Final   Candida parapsilosis NOT DETECTED NOT DETECTED Final   Candida tropicalis NOT DETECTED NOT DETECTED Final    Comment: Performed at [facility], [place]. [address] [place], [place] [address]  MRSA PCR Screening     Status: None   Collection Time: 03/16/17  1:37 PM  Result Value Ref Range Status   MRSA by  PCR NEGATIVE NEGATIVE Final    Comment:        The GeneXpert MRSA Assay (FDA approved for NASAL specimens only), is one component of a comprehensive MRSA colonization surveillance program. It is not intended to diagnose MRSA infection nor to guide or monitor treatment for MRSA infections.          Radiology Studies: No results found.      Scheduled Meds: . collagenase   Topical BID  . feeding supplement (ENSURE ENLIVE)  237 mL Oral BID BM  . furosemide  20 mg Oral Daily  . gabapentin  100-200 mg Oral QHS  . insulin aspart  0-9 Units Subcutaneous TID WC  . linezolid  600 mg Oral Q12H  . multivitamin with minerals  1 tablet Oral Daily   Continuous Infusions:   LOS: 6 days    Time spent: over 30 minutes    Fayrene Helper, MD [facility] Pager [phone]  If 7PM-7AM, please contact night-coverage [email or web address] Password [email or web address] 03/19/2017, 11:40 AM

## 2017-03-20 LAB — COMPREHENSIVE METABOLIC PANEL
ALBUMIN: 1.7 g/dL — AB (ref 3.5–5.0)
ALT: 11 U/L — ABNORMAL LOW (ref 14–54)
ANION GAP: 3 — AB (ref 5–15)
AST: 30 U/L (ref 15–41)
Alkaline Phosphatase: 50 U/L (ref 38–126)
BUN: 11 mg/dL (ref 6–20)
CHLORIDE: 109 mmol/L (ref 101–111)
CO2: 24 mmol/L (ref 22–32)
Calcium: 8.4 mg/dL — ABNORMAL LOW (ref 8.9–10.3)
Creatinine, Ser: 0.8 mg/dL (ref 0.44–1.00)
GFR calc Af Amer: >60 mL/min (ref >60)
GFR calc non Af Amer: >60 mL/min (ref >60)
GLUCOSE: 83 mg/dL (ref 65–99)
POTASSIUM: 3.3 mmol/L — AB (ref 3.5–5.1)
SODIUM: 136 mmol/L (ref 135–145)
Total Bilirubin: 0.6 mg/dL (ref 0.3–1.2)
Total Protein: 5.8 g/dL — ABNORMAL LOW (ref 6.5–8.1)

## 2017-03-20 LAB — CBC
HCT: 27.4 % — ABNORMAL LOW (ref 36.0–46.0)
HEMOGLOBIN: 8.5 g/dL — AB (ref 12.0–15.0)
MCH: 26.5 pg (ref 26.0–34.0)
MCHC: 31 g/dL (ref 30.0–36.0)
MCV: 85.4 fL (ref 78.0–100.0)
PLATELETS: 183 10*3/uL (ref 150–400)
RBC: 3.21 MIL/uL — ABNORMAL LOW (ref 3.87–5.11)
RDW: 16.6 % — AB (ref 11.5–15.5)
WBC: 6.1 10*3/uL (ref 4.0–10.5)

## 2017-03-20 LAB — MAGNESIUM: MAGNESIUM: 1.6 mg/dL — AB (ref 1.7–2.4)

## 2017-03-20 LAB — GLUCOSE, CAPILLARY
GLUCOSE-CAPILLARY: 97 mg/dL (ref 65–99)
GLUCOSE-CAPILLARY: 102 mg/dL — AB (ref 65–99)
Glucose-Capillary: 79 mg/dL (ref 65–99)
Glucose-Capillary: 107 mg/dL — ABNORMAL HIGH (ref 65–99)

## 2017-03-20 MED ORDER — POTASSIUM CHLORIDE CRYS ER 20 MEQ PO TBCR
40.0000 meq | EXTENDED_RELEASE_TABLET | Freq: Once | ORAL | Status: AC
  Administered 2017-03-20: 40 meq via ORAL
  Filled 2017-03-20: qty 2

## 2017-03-20 MED ORDER — MAGNESIUM SULFATE 2 GM/50ML IV SOLN
2.0000 g | Freq: Once | INTRAVENOUS | Status: AC
  Administered 2017-03-20: 2 g via INTRAVENOUS
  Filled 2017-03-20: qty 50

## 2017-03-20 NOTE — Progress Notes (Signed)
HYDROTHERAPY TREATMENT      03/20/17 1500  Subjective Assessment  Subjective "okay"  Patient and Family Stated Goals none stated  Date of Onset (unknown-POA)  Evaluation and Treatment  Evaluation and Treatment Procedures Explained to Patient/Family Yes  Evaluation and Treatment Procedures agreed to  Pressure Injury 03/17/17 Unstageable - Full thickness tissue loss in which the base of the ulcer is covered by slough (yellow, tan, gray, green or brown) and/or eschar (tan, brown or black) in the wound bed. *PT*  Date First Assessed/Time First Assessed: 03/17/17 1340   Location: Ischial tuberosity  Location Orientation: Left  Staging: Unstageable - Full thickness tissue loss in which the base of the ulcer is covered by slough (yellow, tan, gray, green or brown...  Dressing Type ABD;Barrier Film (skin prep);Moist to moist;Gauze (Comment) (Santyl)  Dressing Changed  Dressing Change Frequency Twice a day  State of Healing Non-healing  Site / Wound Assessment Pink;Yellow;Brown  % Wound base Red or Granulating 40%  % Wound base Yellow/Fibrinous Exudate 60%  Peri-wound Assessment Excoriated;Pink  Margins Unattached edges (unapproximated)  Drainage Amount Minimal  Drainage Description Serous  Treatment Debridement (Selective);Hydrotherapy (Pulse lavage);Packing (Saline gauze) (Santyl)  Pressure Injury 03/17/17 Stage IV - Full thickness tissue loss with exposed bone, tendon or muscle. *PT*  S/P I&D 03/16/17  Date First Assessed/Time First Assessed: 03/17/17 1340   Location: Sacrum  Staging: Stage IV - Full thickness tissue loss with exposed bone, tendon or muscle.  Wound Description (Comments): *PT*  S/P I&D 03/16/17  Present on Admission: Yes  Dressing Type ABD;Barrier Film (skin prep);Gauze (Comment);Moist to moist (Santyl)  Dressing Changed  Dressing Change Frequency Twice a day  State of Healing Non-healing  Site / Wound Assessment Black;Brown;Pink;Yellow  % Wound base Red or Granulating  45%  % Wound base Black/Eschar 55%  Peri-wound Assessment (small open area near/in gluteal cleft)  Margins Unattached edges (unapproximated)  Drainage Amount Moderate  Drainage Description Serosanguineous  Treatment Debridement (Selective);Hydrotherapy (Pulse lavage);Packing (Saline gauze) (Santyl)  Hydrotherapy  Pulsed lavage therapy - wound location L Ischial Tuberosity and Sacrum  Pulsed Lavage with Suction (psi) 8 psi  Pulsed Lavage with Suction - Normal Saline Used 1000 mL  Pulsed Lavage Tip Tip with splash shield  Selective Debridement  Selective Debridement - Location sacrum  Selective Debridement - Tools Used Forceps;Scissors  Selective Debridement - Tissue Removed brown and yellow tissue  Wound Therapy - Assess/Plan/Recommendations  Wound Therapy - Clinical Statement 83 yo female admitted from home with multiple wounds including sacral wound found to have osteomyelitis. S/P Incision and Drainage 03/16/17. Hx of CVA, dementia, DM.   Wound Therapy - Functional Problem List Limited mobility due to hx of CVA and recent fall in 09/2016 (per pt). Long periods of time spent sitting in wheelchair without pressure redistribution  Factors Delaying/Impairing Wound Healing Diabetes Mellitus;Incontinence;Immobility;Infection - systemic/local  Hydrotherapy Plan Debridement;Dressing change;Patient/family education;Pulsatile lavage with suction  Wound Therapy - Frequency 6X / week  Wound Therapy - Current Recommendations Case manager/social work  Wound Therapy - Follow Up Recommendations Skilled nursing facility  Wound Plan Continue hydrotherapy, selective and enzymatic debridement  Wound Therapy Goals - Improve the function of patient's integumentary system by progressing the wound(s) through the phases of wound healing by:  Decrease Necrotic Tissue to 25%  Decrease Necrotic Tissue - Progress Progressing toward goal  Increase Granulation Tissue to 75%  Increase Granulation Tissue - Progress  Progressing toward goal  Improve Drainage Characteristics Min  Improve Drainage Characteristics - Progress Progressing toward  goal  Goals/treatment plan/discharge plan were made with and agreed upon by patient/family Yes  Time For Goal Achievement 2 weeks  Wound Therapy - Potential for Goals Poor   Julia Floyd, MPT [phone]

## 2017-03-20 NOTE — Progress Notes (Signed)
LCSW following for SNF placement.   Patient will not dc today.   Patient and family awaiting palliative consult.   Patient has bed at [facility] when ready. Patient needs hydrotherapy.   LCSW will continue to follow.  Carolin Coy [place]

## 2017-03-20 NOTE — Progress Notes (Signed)
PROGRESS NOTE    Julia Floyd  OHY:[id number] DOB: 02/22/1931 DOA: 03/13/2017 PCP: Josetta Huddle, MD   Brief Narrative:  65yow PMH stroke, DM, largely bedbound presented with increasing discharge from sacral wound and worsening right heel wound. CT abd/pelvis concerning fo necrotizing fasciitis. Admitted for IV abx, surgical evaluation. Lives at home with sister.  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 S/p OR debridement 12/17 - BC 1/2 coag negative Staph. MRSA PCR negative. - continue abx as per ID. Per ID, if no associated aggressive wound care, nutrition, and diversion, recommending 2 weeks of oral linezolid (will need CBC twice weekly on linezolid) - per surgery goal is local wound control. Full wound-healing not anticipated given non-ambulatory status.  Right heel wound, pressure +/- ischemia. - ABI: Severe arterial occlusion on right foot. Critical arterial occulusion on left foot.  - at this point in time, awaiting [place] discussion prior to [facility] consult - continue wound care per RN   Normocytic anemia. S/p 2 units PRBC 12/15. - follow clinically.   DM type 2 - CBG remains stable.   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  - not Corneshia Hines candidate for antiplatelets or anticoagulation.  Dementia, mixed type, Alzheimers, vascular - stable   Multiple uterine fibroids possibly causing  mild left-sided hydronephrosis Incidentally noted on CT scan. Outpatient follow-up.  Aortic atherosclerosis  - not currently on statin. Recommend outpatient f/u.  Hypokalemia: - f/u mag, replete prn  02/05/2017 Ortho Dr. Sharol Given office visit:  "Patient has significant peripheral vascular disease... Patient has thin atrophic skin to the right lower extremity there is no open ulcers no swelling. She does not have Zalia Hautala palpable pulse". Follow-up in 4 weeks recommended.  Dr. Sarajane Jews began discussion regarding [place] previously. "Given sacral wound with osteo unlikely to heal w/o aggressive wound care, offloading and colostomy as well was bilateral LE PVD longterm prognosis is guarded. Would not recommend diversion colostomy. PMT/GOC discussion to workout also whether intervention for BLE chronic ischemia is desirable".  DVT prophylaxis: SCD Code Status: full  Family Communication: daughter at bedside Disposition Plan: pending [place] discussion which will be tomorrow   Consultants:   ID, surgery, palliative  Procedures: (Don't include imaging studies which can be auto populated. Include things that cannot be auto populated i.e. Echo, Carotid and venous dopplers, Foley, Bipap, HD, tubes/drains, wound vac, central lines etc)  2 units pRBC 12/15  12/17 Incision and debridement of sacral stage IV decubitus (15x20x3cm including subcutaneous, fascia, muscle and periosteum) and left upper posterior thigh full thickness wound (3x2x2cm, subcutaneous)  Antimicrobials: (specify start and planned stop date. Auto populated tables are space occupying and do not give end dates) 12/14 Vancomycin >> 12/14 Zosyn >> 12/15 clindamycin >>12/12  Subjective: No pain.  Objective: Vitals:   03/19/17 1415 03/19/17 1715 03/19/17 2230 03/20/17 0619  BP: (!) 95/30 (!) 128/40 (!) 105/44 (!) 127/49  Pulse: 71 76 77 77  Resp: 18  18 16   Temp: 98.9 F (37.2 C)  99.4 F (37.4 C) 98.2 F (36.8 C)  TempSrc: Oral   Oral Oral  SpO2:  100% 100% 100%  Weight:      Height:        Intake/Output Summary (Last 24 hours) at 03/20/2017 1037 Last data filed at 03/20/2017 0737 Gross per 24 hour  Intake 720 ml  Output -  Net 720 ml   Filed Weights   03/15/17 0613 03/16/17 0622 03/17/17 0602  Weight: 72.1 kg (158 lb 15.2 oz) 76.5 kg (168 lb 10.4 oz) 77.6 kg (171 lb 1.2 oz)    Examination:  General: No acute distress. Cardiovascular: Heart sounds show Qiana Landgrebe regular rate, and rhythm. No gallops or rubs. No murmurs. No JVD. Lungs: Clear to auscultation bilaterally with good air movement. No rales, rhonchi or wheezes. Abdomen: Soft, nontender, nondistended with normal active bowel sounds. No masses. No hepatosplenomegaly. Neurological: Alert and oriented 3. Cranial nerves II through XII grossly intact. Skin: sacral wound not visualized today, R foot with suspected ischemic ulcer to heel Extremities: No clubbing or cyanosis. No edema. Unable to palpate pedal pulses Psychiatric: Mood and affect are normal. Insight and judgment are appropriate.   Data Reviewed: I have personally reviewed following labs and imaging studies  CBC: Recent Labs  Lab 03/13/17 2104 03/14/17 0555 03/15/17 0602 03/16/17 0519 03/17/17 0531 03/20/17 0629  WBC 13.6* 11.5* 10.4 9.7 8.2 6.1  NEUTROABS 10.8* 8.7*  --   --   --   --   HGB 7.6* 6.5* 9.4* 9.8* 9.1* 8.5*  HCT 24.0* 21.0* 29.2* 31.1* 29.4* 27.4*  MCV 85.7 87.9 84.4 84.3 85.2 85.4  PLT 262 220 200 196 172 106   Basic Metabolic Panel: Recent Labs  Lab 03/14/17 0555 03/15/17 0602 03/16/17 0519 03/17/17 0531 03/20/17 0629  NA 138 137 138 139 136  K 4.2 3.8 3.7 3.5 3.3*  CL 107 108 109 112* 109  CO2 25 22 24 24 24   GLUCOSE 104* 102* 98 107* 83  BUN 24* 16 16 14 11   CREATININE 0.98 0.82 0.92 0.81 0.80  CALCIUM 8.0* 8.1* 8.3* 8.3* 8.4*   GFR: Estimated Creatinine Clearance: 55 mL/min (by C-G formula based on SCr of 0.8 mg/dL). Liver Function Tests: Recent  Labs  Lab 03/13/17 2104 03/14/17 0555 03/20/17 0629  AST 36 21 30  ALT 11* 9* 11*  ALKPHOS 53 49 50  BILITOT 1.3* 0.6 0.6  PROT 7.0 6.3* 5.8*  ALBUMIN 2.3* 2.0* 1.7*   No results for input(s): LIPASE, AMYLASE in the last 168 hours. No results for input(s): AMMONIA in the last 168 hours. Coagulation Profile: No results for input(s): INR, PROTIME in the last 168 hours. Cardiac Enzymes: No results for input(s): CKTOTAL, CKMB, CKMBINDEX, TROPONINI in the last 168 hours. BNP (last 3 results) No results for input(s): PROBNP in the last 8760 hours. HbA1C: No results for input(s): HGBA1C in the last 72 hours. CBG: Recent Labs  Lab 03/19/17 0720 03/19/17 1121 03/19/17 1700 03/19/17 2229 03/20/17 0726  GLUCAP 96 152* 104* 98 79   Lipid Profile: No results for input(s): CHOL, HDL, LDLCALC, TRIG, CHOLHDL, LDLDIRECT in the last 72 hours. Thyroid Function Tests: No results for input(s): TSH, T4TOTAL, FREET4, T3FREE, THYROIDAB in the last 72 hours. Anemia Panel: No results for input(s): VITAMINB12, FOLATE, FERRITIN, TIBC, IRON, RETICCTPCT in the last 72 hours. Sepsis Labs: Recent Labs  Lab 03/13/17 2123  LATICACIDVEN 1.47    Recent  Results (from the past 240 hour(s))  Blood culture (routine x 2)     Status: None   Collection Time: 03/13/17  9:14 PM  Result Value Ref Range Status   Specimen Description BLOOD LEFT FOREARM  Final   Special Requests   Final    BOTTLES DRAWN AEROBIC AND ANAEROBIC Blood Culture adequate volume   Culture   Final    NO GROWTH 5 DAYS Performed at [facility], [address] [place], [place] [address]    Report Status 03/19/2017 FINAL  Final  Blood culture (routine x 2)     Status: Abnormal   Collection Time: 03/13/17 11:56 PM  Result Value Ref Range Status   Specimen Description BLOOD RIGHT HAND  Final   Special Requests IN PEDIATRIC BOTTLE Blood Culture adequate volume  Final   Culture  Setup Time   Final    GRAM POSITIVE COCCI IN  CLUSTERS IN PEDIATRIC BOTTLE CRITICAL RESULT CALLED TO, READ BACK BY AND VERIFIED WITH: E JACKSON,PHARMD AT [id number] 03/15/17 BY L BENFIELD    Culture (Kaziah Krizek)  Final    STAPHYLOCOCCUS SPECIES (COAGULASE NEGATIVE) THE SIGNIFICANCE OF ISOLATING THIS ORGANISM FROM Ioma Chismar SINGLE SET OF BLOOD CULTURES WHEN MULTIPLE SETS ARE DRAWN IS UNCERTAIN. PLEASE NOTIFY THE MICROBIOLOGY DEPARTMENT WITHIN ONE WEEK IF SPECIATION AND SENSITIVITIES ARE REQUIRED. Performed at [facility], [place] [address], [place], [place] [address]    Report Status 03/17/2017 FINAL  Final  Blood Culture ID Panel (Reflexed)     Status: Abnormal   Collection Time: 03/13/17 11:56 PM  Result Value Ref Range Status   Enterococcus species NOT DETECTED NOT DETECTED Final   Listeria monocytogenes NOT DETECTED NOT DETECTED Final   Staphylococcus species DETECTED (Dwain Huhn) NOT DETECTED Final    Comment: Methicillin (oxacillin) resistant coagulase negative staphylococcus. Possible blood culture contaminant (unless isolated from more than one blood culture draw or clinical case suggests pathogenicity). No antibiotic treatment is indicated for blood  culture contaminants. CRITICAL RESULT CALLED TO, READ BACK BY AND VERIFIED WITH: E JACKSON,PHARMD AT [id number] 03/15/17 BY L BENFIELD    Staphylococcus aureus NOT DETECTED NOT DETECTED Final   Methicillin resistance DETECTED (Garvin Ellena) NOT DETECTED Final    Comment: CRITICAL RESULT CALLED TO, READ BACK BY AND VERIFIED WITH: E JACKSON,PHARMD AT 0714 03/15/17 BY L BENFIELD    Streptococcus species NOT DETECTED NOT DETECTED Final   Streptococcus agalactiae NOT DETECTED NOT DETECTED Final   Streptococcus pneumoniae NOT DETECTED NOT DETECTED Final   Streptococcus pyogenes NOT DETECTED NOT DETECTED Final   Acinetobacter baumannii NOT DETECTED NOT DETECTED Final   Enterobacteriaceae species NOT DETECTED NOT DETECTED Final   Enterobacter cloacae complex NOT DETECTED NOT DETECTED Final   Escherichia coli NOT DETECTED NOT  DETECTED Final   Klebsiella oxytoca NOT DETECTED NOT DETECTED Final   Klebsiella pneumoniae NOT DETECTED NOT DETECTED Final   Proteus species NOT DETECTED NOT DETECTED Final   Serratia marcescens NOT DETECTED NOT DETECTED Final   Haemophilus influenzae NOT DETECTED NOT DETECTED Final   Neisseria meningitidis NOT DETECTED NOT DETECTED Final   Pseudomonas aeruginosa NOT DETECTED NOT DETECTED Final   Candida albicans NOT DETECTED NOT DETECTED Final   Candida glabrata NOT DETECTED NOT DETECTED Final   Candida krusei NOT DETECTED NOT DETECTED Final   Candida parapsilosis NOT DETECTED NOT DETECTED Final   Candida tropicalis NOT DETECTED NOT DETECTED Final    Comment: Performed at [facility], [place]. [address], [place], [place] [address]  MRSA PCR Screening     Status: None   Collection Time: 03/16/17  1:37 PM  Result Value Ref Range Status   MRSA by PCR NEGATIVE NEGATIVE Final    Comment:        The GeneXpert MRSA Assay (FDA approved for NASAL specimens only), is one component of Cadie Sorci comprehensive MRSA colonization surveillance program. It is not intended to diagnose MRSA infection nor to guide or monitor treatment for MRSA infections.          Radiology Studies: No results found.      Scheduled Meds: . collagenase   Topical BID  . feeding supplement (ENSURE ENLIVE)  237 mL Oral BID BM  . furosemide  20 mg Oral Daily  . gabapentin  100-200 mg Oral QHS  . insulin aspart  0-9 Units Subcutaneous TID WC  . linezolid  600 mg Oral Q12H  . multivitamin with minerals  1 tablet Oral Daily   Continuous Infusions:   LOS: 7 days    Time spent: over 20 minutes    Fayrene Helper, MD [facility] Pager [phone]  If 7PM-7AM, please contact night-coverage [email or web address] Password [facility] 03/20/2017, 10:37 AM

## 2017-03-21 ENCOUNTER — Inpatient Hospital Stay (HOSPITAL_COMMUNITY): Payer: Medicare Other

## 2017-03-21 LAB — URINALYSIS, ROUTINE W REFLEX MICROSCOPIC
BILIRUBIN URINE: NEGATIVE
GLUCOSE, UA: NEGATIVE mg/dL
Hgb urine dipstick: NEGATIVE
KETONES UR: NEGATIVE mg/dL
NITRITE: NEGATIVE
PH: 5 (ref 5.0–8.0)
Protein, ur: NEGATIVE mg/dL
SPECIFIC GRAVITY, URINE: 1.018 (ref 1.005–1.030)

## 2017-03-21 LAB — GLUCOSE, CAPILLARY
GLUCOSE-CAPILLARY: 110 mg/dL — AB (ref 65–99)
Glucose-Capillary: 68 mg/dL (ref 65–99)
Glucose-Capillary: 91 mg/dL (ref 65–99)
Glucose-Capillary: 125 mg/dL — ABNORMAL HIGH (ref 65–99)
Glucose-Capillary: 90 mg/dL (ref 65–99)

## 2017-03-21 LAB — CBC
HEMATOCRIT: 26.7 % — AB (ref 36.0–46.0)
Hemoglobin: 8.4 g/dL — ABNORMAL LOW (ref 12.0–15.0)
MCH: 26.6 pg (ref 26.0–34.0)
MCHC: 31.5 g/dL (ref 30.0–36.0)
MCV: 84.5 fL (ref 78.0–100.0)
PLATELETS: 170 10*3/uL (ref 150–400)
RBC: 3.16 MIL/uL — ABNORMAL LOW (ref 3.87–5.11)
RDW: 16.5 % — AB (ref 11.5–15.5)
WBC: 5.9 10*3/uL (ref 4.0–10.5)

## 2017-03-21 LAB — BASIC METABOLIC PANEL
Anion gap: 3 — ABNORMAL LOW (ref 5–15)
BUN: 10 mg/dL (ref 6–20)
CALCIUM: 8.3 mg/dL — AB (ref 8.9–10.3)
CO2: 22 mmol/L (ref 22–32)
Chloride: 111 mmol/L (ref 101–111)
Creatinine, Ser: 0.79 mg/dL (ref 0.44–1.00)
GFR calc Af Amer: >60 mL/min (ref >60)
GLUCOSE: 77 mg/dL (ref 65–99)
Potassium: 4.1 mmol/L (ref 3.5–5.1)
Sodium: 136 mmol/L (ref 135–145)

## 2017-03-21 LAB — MAGNESIUM: Magnesium: 1.9 mg/dL (ref 1.7–2.4)

## 2017-03-21 NOTE — Progress Notes (Signed)
PROGRESS NOTE    Julia Floyd  NOB:[id number] DOB: May 01, 1930 DOA: 03/13/2017 PCP: Josetta Huddle, MD   Brief Narrative:  56yow PMH stroke, DM, largely bedbound presented with increasing discharge from sacral wound and worsening right heel wound. CT abd/pelvis concerning fo necrotizing fasciitis. Admitted for IV abx, surgical evaluation. Lives at home with sister.  Assessment & Plan:   Principal Problem:   Sacral osteomyelitis ([place]) Active Problems:   Stroke (cerebrum) ([place]) -  Suspect stroke/TIA s/p tPA with multifocal ICH - etiology unclear, concerning for underlying CAA   Benign essential HTN   Diabetes mellitus type 2 in nonobese (HCC)   Skin ulcer of sacrum with necrosis of muscle (HCC)   Normochromic normocytic anemia   Open wound of right heel   Aortic atherosclerosis (HCC)   Peripheral arterial disease ([place])  Goals of Care:  Family meeting today with palliative, pt and family considering options at this point in time.  Appreciate palliative assistance.   Sacral wound unstagable, osteomyelitis, acute infection. S/p OR debridement 12/17 - BC 1/2 coag negative Staph. MRSA PCR negative. - continue abx as per ID. Per ID, if no associated aggressive wound care, nutrition, and diversion, recommending 2 weeks of oral linezolid (will need CBC twice weekly on linezolid) - per surgery goal is local wound control. Full wound-healing not anticipated given non-ambulatory status.  Fever:  Will reculture and obtain CXR.  Exam of bilateral LE and sacral wound appears stable.  She has no new complaints. [ ]  CXR, UA, Ucx, Bcx  Right heel wound, pressure +/- ischemia. - ABI: Severe arterial occlusion on right foot. Critical arterial occulusion on left foot.  - at this point in time, awaiting [place] discussion prior to [facility] consult - continue wound care per RN   Normocytic anemia. S/p 2 units PRBC 12/15. - follow clinically.   DM type 2 - CBG remains stable.   - continue  gabapentin 12/19 - hold metformin  Essential HTN - stable, continue amlodipine 12/19  CVA 09/2016 s/p TPA, s/p intracranal hemorrhage.  - not a candidate for antiplatelets or anticoagulation.  Dementia, mixed type, Alzheimers, vascular - stable   Multiple uterine fibroids possibly causing mild left-sided hydronephrosis Incidentally noted on CT scan. Outpatient follow-up.  Aortic atherosclerosis  - not currently on statin. Recommend outpatient f/u.  Hypokalemia: - f/u mag, replete prn  02/05/2017 Ortho Dr. Sharol Given office visit:  "Patient has significant peripheral vascular disease... Patient has thin atrophic skin to the right lower extremity there is no open ulcers no swelling. She does not have a palpable pulse". Follow-up in 4 weeks recommended.  Dr. Sarajane Jews began discussion regarding [place] previously. "Given sacral wound with osteo unlikely to heal w/o aggressive wound care, offloading and colostomy as well was bilateral LE PVD longterm prognosis is guarded. Would not recommend diversion colostomy. PMT/GOC discussion to workout also whether intervention for BLE chronic ischemia is desirable".  DVT prophylaxis: SCD Code Status: full  Family Communication: daughter at bedside Disposition Plan: pending [place] discussion which will be tomorrow   Consultants:   ID, surgery, palliative  Procedures: (Don't include imaging studies which can be auto populated. Include things that cannot be auto populated i.e. Echo, Carotid and venous dopplers, Foley, Bipap, HD, tubes/drains, wound vac, central lines etc)  2 units pRBC 12/15  12/17 Incision and debridement of sacral stage IV decubitus (15x20x3cm including subcutaneous, fascia, muscle and periosteum) and left upper posterior thigh full thickness wound (3x2x2cm, subcutaneous)  Antimicrobials: (specify start and planned stop date.  Auto populated tables are space occupying and do not give end dates) Anti-infectives (From admission,  onward)   Start     Dose/Rate Route Frequency Ordered Stop   03/17/17 1515  linezolid (ZYVOX) tablet 600 mg     600 mg Oral Every 12 hours 03/17/17 1509     03/16/17 1421  piperacillin-tazobactam (ZOSYN) 3.375 GM/50ML IVPB    Comments:  [place] Session   : cabinet override      03/16/17 1421 03/16/17 1549   03/15/17 0400  vancomycin (VANCOCIN) IVPB 750 mg/150 ml premix  Status:  Discontinued     750 mg 150 mL/hr over 60 Minutes Intravenous Every 24 hours 03/14/17 0111 03/17/17 1509   03/14/17 0800  clindamycin (CLEOCIN) IVPB 900 mg  Status:  Discontinued     900 mg 100 mL/hr over 30 Minutes Intravenous Every 8 hours 03/14/17 0216 03/14/17 1407   03/14/17 0600  piperacillin-tazobactam (ZOSYN) IVPB 3.375 g  Status:  Discontinued     3.375 g 12.5 mL/hr over 240 Minutes Intravenous Every 8 hours 03/14/17 0247 03/17/17 1509   03/13/17 2345  vancomycin (VANCOCIN) 1,500 mg in sodium chloride 0.9 % 500 mL IVPB     1,500 mg 250 mL/hr over 120 Minutes Intravenous  Once 03/13/17 2332 03/14/17 0305   03/13/17 2330  clindamycin (CLEOCIN) IVPB 600 mg     600 mg 100 mL/hr over 30 Minutes Intravenous  Once 03/13/17 2327 03/14/17 0045   03/13/17 2330  piperacillin-tazobactam (ZOSYN) IVPB 3.375 g     3.375 g 100 mL/hr over 30 Minutes Intravenous  Once 03/13/17 2327 03/14/17 0045      Subjective: No pain.  Didn't notice fever.    Objective: Vitals:   03/20/17 2235 03/21/17 0655 03/21/17 1445 03/21/17 1613  BP: (!) 104/40 (!) 127/44 (!) 160/76   Pulse: 81 69 98   Resp: 18 20 18    Temp: 100 F (37.8 C) 98.4 F (36.9 C) 100 F (37.8 C) (!) 102.9 F (39.4 C)  TempSrc: Axillary Oral Oral Oral  SpO2: 100% 100% 100%   Weight:  82.9 kg (182 lb 12.2 oz)    Height:        Intake/Output Summary (Last 24 hours) at 03/21/2017 1626 Last data filed at 03/21/2017 0900 Gross per 24 hour  Intake 410 ml  Output 350 ml  Net 60 ml   Filed Weights   03/16/17 0622 03/17/17 0602 03/21/17 0655  Weight:  76.5 kg (168 lb 10.4 oz) 77.6 kg (171 lb 1.2 oz) 82.9 kg (182 lb 12.2 oz)    Examination:  General: No acute distress. Cardiovascular: Heart sounds show a regular rate, and rhythm. No gallops or rubs. No murmurs. No JVD. Lungs: Clear to auscultation bilaterally with good air movement. No rales, rhonchi or wheezes. Abdomen: Soft, nontender, nondistended with normal active bowel sounds. No masses. No hepatosplenomegaly. Neurological: Alert and oriented 3. Cranial nerves II through XII grossly intact. Skin: extensive decubitus ulcer, no visible purulence, appears stable compared with prior exams.  R heel with pressure/ischemic injury with eschar.  No purulence noted. Extremities: No clubbing or cyanosis. No edema.  Psychiatric: Mood and affect are normal. Insight and judgment are appropriate.   Data Reviewed: I have personally reviewed following labs and imaging studies  CBC: Recent Labs  Lab 03/15/17 0602 03/16/17 0519 03/17/17 0531 03/20/17 0629 03/21/17 0629  WBC 10.4 9.7 8.2 6.1 5.9  HGB 9.4* 9.8* 9.1* 8.5* 8.4*  HCT 29.2* 31.1* 29.4* 27.4* 26.7*  MCV 84.4  84.3 85.2 85.4 84.5  PLT 200 196 172 183 371   Basic Metabolic Panel: Recent Labs  Lab 03/15/17 0602 03/16/17 0519 03/17/17 0531 03/20/17 0629 03/21/17 0629  NA 137 138 139 136 136  K 3.8 3.7 3.5 3.3* 4.1  CL 108 109 112* 109 111  CO2 22 24 24 24 22   GLUCOSE 102* 98 107* 83 77  BUN 16 16 14 11 10   CREATININE 0.82 0.92 0.81 0.80 0.79  CALCIUM 8.1* 8.3* 8.3* 8.4* 8.3*  MG  --   --   --  1.6* 1.9   GFR: Estimated Creatinine Clearance: 56.8 mL/min (by C-G formula based on SCr of 0.79 mg/dL). Liver Function Tests: Recent Labs  Lab 03/20/17 0629  AST 30  ALT 11*  ALKPHOS 50  BILITOT 0.6  PROT 5.8*  ALBUMIN 1.7*   No results for input(s): LIPASE, AMYLASE in the last 168 hours. No results for input(s): AMMONIA in the last 168 hours. Coagulation Profile: No results for input(s): INR, PROTIME in the last 168  hours. Cardiac Enzymes: No results for input(s): CKTOTAL, CKMB, CKMBINDEX, TROPONINI in the last 168 hours. BNP (last 3 results) No results for input(s): PROBNP in the last 8760 hours. HbA1C: No results for input(s): HGBA1C in the last 72 hours. CBG: Recent Labs  Lab 03/20/17 1644 03/20/17 2239 03/21/17 0744 03/21/17 0813 03/21/17 1132  GLUCAP 97 102* 68 91 110*   Lipid Profile: No results for input(s): CHOL, HDL, LDLCALC, TRIG, CHOLHDL, LDLDIRECT in the last 72 hours. Thyroid Function Tests: No results for input(s): TSH, T4TOTAL, FREET4, T3FREE, THYROIDAB in the last 72 hours. Anemia Panel: No results for input(s): VITAMINB12, FOLATE, FERRITIN, TIBC, IRON, RETICCTPCT in the last 72 hours. Sepsis Labs: No results for input(s): PROCALCITON, LATICACIDVEN in the last 168 hours.  Recent Results (from the past 240 hour(s))  Blood culture (routine x 2)     Status: None   Collection Time: 03/13/17  9:14 PM  Result Value Ref Range Status   Specimen Description BLOOD LEFT FOREARM  Final   Special Requests   Final    BOTTLES DRAWN AEROBIC AND ANAEROBIC Blood Culture adequate volume   Culture   Final    NO GROWTH 5 DAYS Performed at [facility], [address] [place], [place] [address]    Report Status 03/19/2017 FINAL  Final  Blood culture (routine x 2)     Status: Abnormal   Collection Time: 03/13/17 11:56 PM  Result Value Ref Range Status   Specimen Description BLOOD RIGHT HAND  Final   Special Requests IN PEDIATRIC BOTTLE Blood Culture adequate volume  Final   Culture  Setup Time   Final    GRAM POSITIVE COCCI IN CLUSTERS IN PEDIATRIC BOTTLE CRITICAL RESULT CALLED TO, READ BACK BY AND VERIFIED WITH: E JACKSON,PHARMD AT 4854 03/15/17 BY L BENFIELD    Culture (A)  Final    STAPHYLOCOCCUS SPECIES (COAGULASE NEGATIVE) THE SIGNIFICANCE OF ISOLATING THIS ORGANISM FROM A SINGLE SET OF BLOOD CULTURES WHEN MULTIPLE SETS ARE DRAWN IS UNCERTAIN. PLEASE NOTIFY THE MICROBIOLOGY  DEPARTMENT WITHIN ONE WEEK IF SPECIATION AND SENSITIVITIES ARE REQUIRED. Performed at [facility], [place] [address], [place], [place] [address]    Report Status 03/17/2017 FINAL  Final  Blood Culture ID Panel (Reflexed)     Status: Abnormal   Collection Time: 03/13/17 11:56 PM  Result Value Ref Range Status   Enterococcus species NOT DETECTED NOT DETECTED Final   Listeria monocytogenes NOT  DETECTED NOT DETECTED Final   Staphylococcus species DETECTED (A) NOT DETECTED Final    Comment: Methicillin (oxacillin) resistant coagulase negative staphylococcus. Possible blood culture contaminant (unless isolated from more than one blood culture draw or clinical case suggests pathogenicity). No antibiotic treatment is indicated for blood  culture contaminants. CRITICAL RESULT CALLED TO, READ BACK BY AND VERIFIED WITH: E JACKSON,PHARMD AT 5427 03/15/17 BY L BENFIELD    Staphylococcus aureus NOT DETECTED NOT DETECTED Final   Methicillin resistance DETECTED (A) NOT DETECTED Final    Comment: CRITICAL RESULT CALLED TO, READ BACK BY AND VERIFIED WITH: E JACKSON,PHARMD AT 0714 03/15/17 BY L BENFIELD    Streptococcus species NOT DETECTED NOT DETECTED Final   Streptococcus agalactiae NOT DETECTED NOT DETECTED Final   Streptococcus pneumoniae NOT DETECTED NOT DETECTED Final   Streptococcus pyogenes NOT DETECTED NOT DETECTED Final   Acinetobacter baumannii NOT DETECTED NOT DETECTED Final   Enterobacteriaceae species NOT DETECTED NOT DETECTED Final   Enterobacter cloacae complex NOT DETECTED NOT DETECTED Final   Escherichia coli NOT DETECTED NOT DETECTED Final   Klebsiella oxytoca NOT DETECTED NOT DETECTED Final   Klebsiella pneumoniae NOT DETECTED NOT DETECTED Final   Proteus species NOT DETECTED NOT DETECTED Final   Serratia marcescens NOT DETECTED NOT DETECTED Final   Haemophilus influenzae NOT DETECTED NOT DETECTED Final   Neisseria meningitidis NOT DETECTED NOT DETECTED Final   Pseudomonas  aeruginosa NOT DETECTED NOT DETECTED Final   Candida albicans NOT DETECTED NOT DETECTED Final   Candida glabrata NOT DETECTED NOT DETECTED Final   Candida krusei NOT DETECTED NOT DETECTED Final   Candida parapsilosis NOT DETECTED NOT DETECTED Final   Candida tropicalis NOT DETECTED NOT DETECTED Final    Comment: Performed at [facility], [place]. [address] [place], [place] [address]  MRSA PCR Screening     Status: None   Collection Time: 03/16/17  1:37 PM  Result Value Ref Range Status   MRSA by PCR NEGATIVE NEGATIVE Final    Comment:        The GeneXpert MRSA Assay (FDA approved for NASAL specimens only), is one component of a comprehensive MRSA colonization surveillance program. It is not intended to diagnose MRSA infection nor to guide or monitor treatment for MRSA infections.          Radiology Studies: No results found.      Scheduled Meds: . collagenase   Topical BID  . feeding supplement (ENSURE ENLIVE)  237 mL Oral BID BM  . furosemide  20 mg Oral Daily  . gabapentin  100-200 mg Oral QHS  . insulin aspart  0-9 Units Subcutaneous TID WC  . linezolid  600 mg Oral Q12H  . multivitamin with minerals  1 tablet Oral Daily   Continuous Infusions:   LOS: 8 days    Time spent: over 20 minutes    Fayrene Helper, MD [facility] Pager [phone]  If 7PM-7AM, please contact night-coverage [email or web address] Password [facility] 03/21/2017, 4:26 PM

## 2017-03-21 NOTE — Progress Notes (Signed)
Hypoglycemic Event  CBG: 68  Treatment: carb snack  Symptoms: none  Follow-up CBG: Time: 0813 CBG Result: 91  Possible Reasons for Event:   Comments/MD notified: Dr. Florene Glen via text page    Lezlie Octave

## 2017-03-21 NOTE — Progress Notes (Signed)
   03/21/17  13:30 - 14:00  Evaluation and Treatment  Evaluation and Treatment Procedures Explained to Patient/Family Yes  Evaluation and Treatment Procedures agreed to  Pressure Injury 03/17/17 Stage IV - Full thickness tissue loss with exposed bone, tendon or muscle. *PT*  S/P I&D 03/16/17  Date First Assessed/Time First Assessed: 03/17/17 1340   Location: Sacrum  Staging: Stage IV - Full thickness tissue loss with exposed bone, tendon or muscle.  Wound Description (Comments): *PT*  S/P I&D 03/16/17  Present on Admission: Yes  Dressing Type ABD;Barrier Film (skin prep);Gauze (Comment);Moist to moist (Santyl)  Dressing Changed  Dressing Change Frequency Twice a day  State of Healing Non-healing  Site / Wound Assessment Black;Brown;Pink;Yellow  % Wound base Red or Granulating 45%  % Wound base Black/Eschar 55%  Peri-wound Assessment (small open area near/in gluteal cleft)  Margins Unattached edges (unapproximated)  Drainage Amount Moderate  Drainage Description Serosanguineous  Hydrotherapy  Pulsed lavage therapy - wound location L Ischial Tuberosity and Sacrum  Pulsed Lavage with Suction (psi) 8 psi  Pulsed Lavage with Suction - Normal Saline Used 1000 mL  Pulsed Lavage Tip Tip with splash shield  Selective Debridement  Selective Debridement - Location sacrum  Selective Debridement - Tools Used Forceps;Scissors  Selective Debridement - Tissue Removed brown and yellow tissue  Wound Therapy - Assess/Plan/Recommendations  Wound Therapy - Clinical Statement 83 yo female admitted from home with multiple wounds including sacral wound found to have osteomyelitis. S/P Incision and Drainage 03/16/17. Hx of CVA, dementia, DM.   Wound Therapy - Functional Problem List Limited mobility due to hx of CVA and recent fall in 09/2016 (per pt). Long periods of time spent sitting in wheelchair without pressure redistribution  Factors Delaying/Impairing Wound Healing Diabetes  Mellitus;Incontinence;Immobility;Infection - systemic/local  Hydrotherapy Plan Debridement;Dressing change;Patient/family education;Pulsatile lavage with suction  Wound Therapy - Frequency 6X / week  Wound Therapy - Current Recommendations Case manager/social work  Wound Therapy - Follow Up Recommendations Skilled nursing facility  Wound Plan Continue hydrotherapy, selective and enzymatic debridement  Wound Therapy Goals - Improve the function of patient's integumentary system by progressing the wound(s) through the phases of wound healing by:  Decrease Necrotic Tissue to 25%  Increase Granulation Tissue to 75%  Improve Drainage Characteristics Min  Goals/treatment plan/discharge plan were made with and agreed upon by patient/family Yes  Time For Goal Achievement 2 weeks  Wound Therapy - Potential for Goals Poor  Rica Koyanagi  PTA [facility]  Acute  Rehab Pager      [phone]

## 2017-03-22 LAB — BASIC METABOLIC PANEL
ANION GAP: 5 (ref 5–15)
BUN: 10 mg/dL (ref 6–20)
CALCIUM: 8.3 mg/dL — AB (ref 8.9–10.3)
CO2: 20 mmol/L — ABNORMAL LOW (ref 22–32)
Chloride: 111 mmol/L (ref 101–111)
Creatinine, Ser: 0.71 mg/dL (ref 0.44–1.00)
GFR calc Af Amer: >60 mL/min (ref >60)
GLUCOSE: 79 mg/dL (ref 65–99)
Potassium: 4.4 mmol/L (ref 3.5–5.1)
SODIUM: 136 mmol/L (ref 135–145)

## 2017-03-22 LAB — GLUCOSE, CAPILLARY
GLUCOSE-CAPILLARY: 83 mg/dL (ref 65–99)
Glucose-Capillary: 98 mg/dL (ref 65–99)
Glucose-Capillary: 83 mg/dL (ref 65–99)
Glucose-Capillary: 120 mg/dL — ABNORMAL HIGH (ref 65–99)

## 2017-03-22 LAB — CBC
HCT: 27 % — ABNORMAL LOW (ref 36.0–46.0)
Hemoglobin: 8.5 g/dL — ABNORMAL LOW (ref 12.0–15.0)
MCH: 26.5 pg (ref 26.0–34.0)
MCHC: 31.5 g/dL (ref 30.0–36.0)
MCV: 84.1 fL (ref 78.0–100.0)
PLATELETS: 177 10*3/uL (ref 150–400)
RBC: 3.21 MIL/uL — ABNORMAL LOW (ref 3.87–5.11)
RDW: 16.6 % — AB (ref 11.5–15.5)
WBC: 7.5 10*3/uL (ref 4.0–10.5)

## 2017-03-22 NOTE — Progress Notes (Addendum)
Palliative care progress note  Reason for consult: Goals of care in light of nonhealing wounds  I met today with Ms. Russum and several of her family members including her daughter Jeanne Ivan), granddaughter Yvetta Coder), [place] husband, and a grandson.   We reviewed that the most important things to Ms. Wiederholt are family and her faith.  Her daughter and granddaughter report that they have been talking with physicians regarding the fact that she has a non-healable wound on her backside.  We reviewed in detail concerns about this as well as ischemia of lower extremities bilaterally with possibility of amputation.  I shared with family concerns that aggressive medical interventions would include major surgeries and would still have low likelihood of providing meaningful recovery from her current condition.  Her family reports that they do not think that she would survive surgical procedures and indicated that at this time they think that it would be a bad idea to pursue these interventions.  We talked about options moving forward including continuation of medical care versus a focus on comfort.  She had been living with 1 of her daughters at home and it appears that this is not a realistic possibility moving forward.  Family reports the plan from here is to go to [place] place.  We discussed options for continuation of wound care and why long-term antibiotics are unlikely to be beneficial as this is not going to heal.  We also discussed some regarding hydrotherapy and how this can be a painful procedure.  Overall, her family reports understanding the complexity of her situation but also report needing some time to speak as a family to figure out what is the best steps moving forward.  I also discussed with them regarding CODE STATUS and the goal of medical interventions being to allow her to be well enough to leave the hospital in a meaningful way and how if she suffers from cardiac or  respiratory arrest these interventions are likely to result in her maintaining her current quality of life (which she reports is already significantly compromised from what she finds acceptable).  Family to call to set up another meeting in the next day or two.  Questions and concerns addressed.   PMT will continue to support holistically.  Total time: 70 minutes Greater than 50%  of this time was spent counseling and coordinating care related to the above assessment and plan. Extended billing time  Micheline Rough, MD [place] Team [phone]

## 2017-03-22 NOTE — Progress Notes (Signed)
PROGRESS NOTE    Julia Floyd  HKV:[id number] DOB: Nov 13, 1930 DOA: 03/13/2017 PCP: Josetta Huddle, MD   Brief Narrative:  61yow PMH stroke, DM, largely bedbound presented with increasing discharge from sacral wound and worsening right heel wound. CT abd/pelvis concerning fo necrotizing fasciitis. Admitted for IV abx, surgical evaluation. Lives at home with sister.  03/22/17: Patient seen. Also discussed with the Palliative care team. Palliative care is discussing with the patient's family. Nil new changes. Disposition will depend on the outcome between Palliative care team and the patient's family (I.e. Goal of care).  Assessment & Plan:   Principal Problem:   Sacral osteomyelitis ([place]) Active Problems:   Stroke (cerebrum) ([place]) -  Suspect stroke/TIA s/p tPA with multifocal ICH - etiology unclear, concerning for underlying CAA   Benign essential HTN   Diabetes mellitus type 2 in nonobese (HCC)   Skin ulcer of sacrum with necrosis of muscle (HCC)   Normochromic normocytic anemia   Open wound of right heel   Aortic atherosclerosis (HCC)   Peripheral arterial disease ([place])  Goals of Care:  Family meeting with palliative.  Sacral wound unstagable, osteomyelitis, acute infection. S/p OR debridement 12/17 - BC 1/2 coag negative Staph. MRSA PCR negative. - continue abx as per ID. Per ID, if no associated aggressive wound care, nutrition, and diversion, recommending 2 weeks of oral linezolid (will need CBC twice weekly on linezolid) - per surgery goal is local wound control. Full wound-healing not anticipated given non-ambulatory status.  Fever:   - Continue to assess. Tmax is 102.9. - Follow work up. - Continue antibiotics.  Right heel wound, pressure +/- ischemia. - ABI: Severe arterial occlusion on right foot. Critical arterial occulusion on left foot.  - at this point in time, awaiting [place] discussion prior to [facility] consult - continue wound care per RN   Normocytic anemia.  S/p 2 units PRBC 12/15. - follow clinically.   DM type 2 - CBG remains stable.   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  - not a candidate for antiplatelets or anticoagulation.  Dementia, mixed type, Alzheimers, vascular - stable   Multiple uterine fibroids possibly causing mild left-sided hydronephrosis Incidentally noted on CT scan. Outpatient follow-up.  Aortic atherosclerosis  - not currently on statin. Recommend outpatient f/u.  Hypokalemia: - Resolved  02/05/2017 Ortho Dr. Sharol Given office visit:  "Patient has significant peripheral vascular disease... Patient has thin atrophic skin to the right lower extremity there is no open ulcers no swelling. She does not have a palpable pulse". Follow-up in 4 weeks recommended.  Dr. Sarajane Jews began discussion regarding [place] previously. "Given sacral wound with osteo unlikely to heal w/o aggressive wound care, offloading and colostomy as well was bilateral LE PVD longterm prognosis is guarded. Would not recommend diversion colostomy. PMT/GOC discussion to workout also whether intervention for BLE chronic ischemia is desirable".  DVT prophylaxis: SCD Code Status: full  Family Communication: daughter at bedside Disposition Plan: pending [place] discussion which will be tomorrow   Consultants:   ID, surgery, palliative  Procedures: (Don't include imaging studies which can be auto populated. Include things that cannot be auto populated i.e. Echo, Carotid and venous dopplers, Foley, Bipap, HD, tubes/drains, wound vac, central lines etc)  2 units pRBC 12/15  12/17 Incision and debridement of sacral stage IV decubitus (15x20x3cm including subcutaneous, fascia, muscle and periosteum) and left upper posterior thigh full thickness wound (3x2x2cm,  subcutaneous)  Antimicrobials: (specify start and planned stop date. Auto populated  tables are space occupying and do not give end dates) Anti-infectives (From admission, onward)   Start     Dose/Rate Route Frequency Ordered Stop   03/17/17 1515  linezolid (ZYVOX) tablet 600 mg     600 mg Oral Every 12 hours 03/17/17 1509     03/16/17 1421  piperacillin-tazobactam (ZOSYN) 3.375 GM/50ML IVPB    Comments:  [place] Session   : cabinet override      03/16/17 1421 03/16/17 1549   03/15/17 0400  vancomycin (VANCOCIN) IVPB 750 mg/150 ml premix  Status:  Discontinued     750 mg 150 mL/hr over 60 Minutes Intravenous Every 24 hours 03/14/17 0111 03/17/17 1509   03/14/17 0800  clindamycin (CLEOCIN) IVPB 900 mg  Status:  Discontinued     900 mg 100 mL/hr over 30 Minutes Intravenous Every 8 hours 03/14/17 0216 03/14/17 1407   03/14/17 0600  piperacillin-tazobactam (ZOSYN) IVPB 3.375 g  Status:  Discontinued     3.375 g 12.5 mL/hr over 240 Minutes Intravenous Every 8 hours 03/14/17 0247 03/17/17 1509   03/13/17 2345  vancomycin (VANCOCIN) 1,500 mg in sodium chloride 0.9 % 500 mL IVPB     1,500 mg 250 mL/hr over 120 Minutes Intravenous  Once 03/13/17 2332 03/14/17 0305   03/13/17 2330  clindamycin (CLEOCIN) IVPB 600 mg     600 mg 100 mL/hr over 30 Minutes Intravenous  Once 03/13/17 2327 03/14/17 0045   03/13/17 2330  piperacillin-tazobactam (ZOSYN) IVPB 3.375 g     3.375 g 100 mL/hr over 30 Minutes Intravenous  Once 03/13/17 2327 03/14/17 0045      Subjective: No new complaints.    Objective: Vitals:   03/21/17 1808 03/21/17 2132 03/22/17 0538 03/22/17 1457  BP:  (!) 140/49  133/62  Pulse:  81 81 88  Resp:  18  18  Temp: 99.3 F (37.4 C) 98.9 F (37.2 C) (!) 88 F (31.1 C) 98.4 F (36.9 C)  TempSrc: Oral Oral Oral Oral  SpO2:  100%  100%  Weight:      Height:        Intake/Output Summary (Last 24 hours) at 03/22/2017 1710 Last data filed at 03/22/2017 1300 Gross per 24 hour  Intake 360 ml  Output -  Net 360 ml   Filed Weights   03/16/17 0622 03/17/17 0602  03/21/17 0655  Weight: 76.5 kg (168 lb 10.4 oz) 77.6 kg (171 lb 1.2 oz) 82.9 kg (182 lb 12.2 oz)    Examination:  General: No acute distress. Cardiovascular: Heart sounds show a regular rate, and rhythm. No gallops or rubs. No murmurs. No JVD. Lungs: Clear to auscultation bilaterally with good air movement. No rales, rhonchi or wheezes. Abdomen: Soft, nontender, nondistended with normal active bowel sounds. No masses. No hepatosplenomegaly. Neurological: Alert and oriented 3. Cranial nerves II through XII grossly intact. Skin: extensive decubitus ulcer, no visible purulence, appears stable compared with prior exams.  R heel with pressure/ischemic injury with eschar.  No purulence noted. Extremities: No clubbing or cyanosis. No edema.    Data Reviewed: I have personally reviewed following labs and imaging studies  CBC: Recent Labs  Lab 03/16/17 0519 03/17/17 0531 03/20/17 0629 03/21/17 0629 03/22/17 0614  WBC 9.7 8.2 6.1 5.9 7.5  HGB 9.8* 9.1* 8.5* 8.4* 8.5*  HCT 31.1* 29.4* 27.4* 26.7* 27.0*  MCV 84.3 85.2 85.4 84.5 84.1  PLT 196 172 183 170 177  Basic Metabolic Panel: Recent Labs  Lab 03/16/17 0519 03/17/17 0531 03/20/17 0629 03/21/17 0629 03/22/17 0614  NA 138 139 136 136 136  K 3.7 3.5 3.3* 4.1 4.4  CL 109 112* 109 111 111  CO2 24 24 24 22  20*  GLUCOSE 98 107* 83 77 79  BUN 16 14 11 10 10   CREATININE 0.92 0.81 0.80 0.79 0.71  CALCIUM 8.3* 8.3* 8.4* 8.3* 8.3*  MG  --   --  1.6* 1.9  --    GFR: Estimated Creatinine Clearance: 56.8 mL/min (by C-G formula based on SCr of 0.71 mg/dL). Liver Function Tests: Recent Labs  Lab 03/20/17 0629  AST 30  ALT 11*  ALKPHOS 50  BILITOT 0.6  PROT 5.8*  ALBUMIN 1.7*   No results for input(s): LIPASE, AMYLASE in the last 168 hours. No results for input(s): AMMONIA in the last 168 hours. Coagulation Profile: No results for input(s): INR, PROTIME in the last 168 hours. Cardiac Enzymes: No results for input(s):  CKTOTAL, CKMB, CKMBINDEX, TROPONINI in the last 168 hours. BNP (last 3 results) No results for input(s): PROBNP in the last 8760 hours. HbA1C: No results for input(s): HGBA1C in the last 72 hours. CBG: Recent Labs  Lab 03/21/17 1733 03/21/17 2134 03/22/17 0806 03/22/17 1222 03/22/17 1656  GLUCAP 125* 90 83 98 83   Lipid Profile: No results for input(s): CHOL, HDL, LDLCALC, TRIG, CHOLHDL, LDLDIRECT in the last 72 hours. Thyroid Function Tests: No results for input(s): TSH, T4TOTAL, FREET4, T3FREE, THYROIDAB in the last 72 hours. Anemia Panel: No results for input(s): VITAMINB12, FOLATE, FERRITIN, TIBC, IRON, RETICCTPCT in the last 72 hours. Sepsis Labs: No results for input(s): PROCALCITON, LATICACIDVEN in the last 168 hours.  Recent Results (from the past 240 hour(s))  Blood culture (routine x 2)     Status: None   Collection Time: 03/13/17  9:14 PM  Result Value Ref Range Status   Specimen Description BLOOD LEFT FOREARM  Final   Special Requests   Final    BOTTLES DRAWN AEROBIC AND ANAEROBIC Blood Culture adequate volume   Culture   Final    NO GROWTH 5 DAYS Performed at [facility], [address], [place], [place] [address]    Report Status 03/19/2017 FINAL  Final  Blood culture (routine x 2)     Status: Abnormal   Collection Time: 03/13/17 11:56 PM  Result Value Ref Range Status   Specimen Description BLOOD RIGHT HAND  Final   Special Requests IN PEDIATRIC BOTTLE Blood Culture adequate volume  Final   Culture  Setup Time   Final    GRAM POSITIVE COCCI IN CLUSTERS IN PEDIATRIC BOTTLE CRITICAL RESULT CALLED TO, READ BACK BY AND VERIFIED WITH: E JACKSON,PHARMD AT 0814 03/15/17 BY L BENFIELD    Culture (A)  Final    STAPHYLOCOCCUS SPECIES (COAGULASE NEGATIVE) THE SIGNIFICANCE OF ISOLATING THIS ORGANISM FROM A SINGLE SET OF BLOOD CULTURES WHEN MULTIPLE SETS ARE DRAWN IS UNCERTAIN. PLEASE NOTIFY THE MICROBIOLOGY DEPARTMENT WITHIN ONE WEEK IF SPECIATION AND  SENSITIVITIES ARE REQUIRED. Performed at [facility], [place] [address], [place], [place] [address]    Report Status 03/17/2017 FINAL  Final  Blood Culture ID Panel (Reflexed)     Status: Abnormal   Collection Time: 03/13/17 11:56 PM  Result Value Ref Range Status   Enterococcus species NOT DETECTED NOT DETECTED Final   Listeria monocytogenes NOT DETECTED NOT DETECTED Final   Staphylococcus species DETECTED (A) NOT DETECTED Final  Comment: Methicillin (oxacillin) resistant coagulase negative staphylococcus. Possible blood culture contaminant (unless isolated from more than one blood culture draw or clinical case suggests pathogenicity). No antibiotic treatment is indicated for blood  culture contaminants. CRITICAL RESULT CALLED TO, READ BACK BY AND VERIFIED WITH: E JACKSON,PHARMD AT 7017 03/15/17 BY L BENFIELD    Staphylococcus aureus NOT DETECTED NOT DETECTED Final   Methicillin resistance DETECTED (A) NOT DETECTED Final    Comment: CRITICAL RESULT CALLED TO, READ BACK BY AND VERIFIED WITH: E JACKSON,PHARMD AT 0714 03/15/17 BY L BENFIELD    Streptococcus species NOT DETECTED NOT DETECTED Final   Streptococcus agalactiae NOT DETECTED NOT DETECTED Final   Streptococcus pneumoniae NOT DETECTED NOT DETECTED Final   Streptococcus pyogenes NOT DETECTED NOT DETECTED Final   Acinetobacter baumannii NOT DETECTED NOT DETECTED Final   Enterobacteriaceae species NOT DETECTED NOT DETECTED Final   Enterobacter cloacae complex NOT DETECTED NOT DETECTED Final   Escherichia coli NOT DETECTED NOT DETECTED Final   Klebsiella oxytoca NOT DETECTED NOT DETECTED Final   Klebsiella pneumoniae NOT DETECTED NOT DETECTED Final   Proteus species NOT DETECTED NOT DETECTED Final   Serratia marcescens NOT DETECTED NOT DETECTED Final   Haemophilus influenzae NOT DETECTED NOT DETECTED Final   Neisseria meningitidis NOT DETECTED NOT DETECTED Final   Pseudomonas aeruginosa NOT DETECTED NOT DETECTED Final    Candida albicans NOT DETECTED NOT DETECTED Final   Candida glabrata NOT DETECTED NOT DETECTED Final   Candida krusei NOT DETECTED NOT DETECTED Final   Candida parapsilosis NOT DETECTED NOT DETECTED Final   Candida tropicalis NOT DETECTED NOT DETECTED Final    Comment: Performed at [facility], [place]. [address] [place], [place] [address]  MRSA PCR Screening     Status: None   Collection Time: 03/16/17  1:37 PM  Result Value Ref Range Status   MRSA by PCR NEGATIVE NEGATIVE Final    Comment:        The GeneXpert MRSA Assay (FDA approved for NASAL specimens only), is one component of a comprehensive MRSA colonization surveillance program. It is not intended to diagnose MRSA infection nor to guide or monitor treatment for MRSA infections.          Radiology Studies: Dg Chest Port 1 View  Result Date: 03/21/2017 CLINICAL DATA:  Fever. EXAM: PORTABLE CHEST 1 VIEW COMPARISON:  01/14/2017 and 08/18/2016 FINDINGS: Patient slightly rotated to the right. Lungs are adequately inflated without focal airspace consolidation or effusion. Cardiomediastinal silhouette and remainder of the exam is unchanged. IMPRESSION: No active disease. Electronically Signed   By: Marin Olp M.D.   On: 03/21/2017 17:41        Scheduled Meds: . collagenase   Topical BID  . feeding supplement (ENSURE ENLIVE)  237 mL Oral BID BM  . furosemide  20 mg Oral Daily  . gabapentin  100-200 mg Oral QHS  . insulin aspart  0-9 Units Subcutaneous TID WC  . linezolid  600 mg Oral Q12H  . multivitamin with minerals  1 tablet Oral Daily   Continuous Infusions:   LOS: 9 days    Time spent: over 20 minutes    Bonnell Public, MD [facility] Pager [phone]  If 7PM-7AM, please contact night-coverage [email or web address] Password [email or web address] 03/22/2017, 5:10 PM

## 2017-03-22 NOTE — Progress Notes (Signed)
Palliative care progress note  Reason for consult: Goals of care in light of nonhealing wounds  I met today with Julia Floyd and we reviewed conversation from yesterday.  This included review of options moving forward including continuation of medical care versus a focus on comfort.    We discussed options for continuation of wound care and why long-term antibiotics are unlikely to be beneficial as this is not going to heal.  We also discussed some regarding hydrotherapy and how this can be a painful procedure.  She tells me today that she is not interested in major surgeries (may consent to smaller procedures such as bedside debridement).  At meeting yesterday, her family reported understanding the complexity of her situation but also report needing some time to speak as a family to figure out what is the best steps moving forward.  I think that the most likely discharge will be to SNF for localized wound care and hydrotherapy.  Family yesterday reported inability to care for her at home and without resources to private pay for home or long term care.  Family was to call to set up another meeting in the next day or two.  Questions and concerns addressed.   PMT will continue to support holistically.  Total time: 30 minutes Greater than 50%  of this time was spent counseling and coordinating care related to the above assessment and plan.  Micheline Rough, MD [place] Team [phone]

## 2017-03-23 LAB — GLUCOSE, CAPILLARY
GLUCOSE-CAPILLARY: 78 mg/dL (ref 65–99)
GLUCOSE-CAPILLARY: 124 mg/dL — AB (ref 65–99)
Glucose-Capillary: 89 mg/dL (ref 65–99)
Glucose-Capillary: 99 mg/dL (ref 65–99)

## 2017-03-23 LAB — URINE CULTURE: Culture: NO GROWTH

## 2017-03-23 NOTE — Progress Notes (Signed)
Physical Therapy Treatment Patient Details Name: Julia Floyd MRN: [id number] DOB: 1930-07-11 Today's Date: 03/23/2017    History of Present Illness 83 yo female admitted with multiple wounds. Sacral wound found to have osteomyelitis-s/p I&D 03/16/17. Hx of CVA, dementia, R ankle dislocation/fx 12/2016    PT Comments    Pt continues to require significant assistance for mobility. She was able to sit EOB for ~5 minutes with Min-Mod assist for static balance. She was able to sit without external assistance, with 2 hand support, for 5-10 seconds at a time, before losing balance. Worked on anterior weightshifting in sitting as well. Continue to recommend SNF.     Follow Up Recommendations  SNF     Equipment Recommendations  None recommended by PT    Recommendations for Other Services       Precautions / Restrictions Precautions Precautions: Fall Restrictions Weight Bearing Restrictions: No    Mobility  Bed Mobility Overal bed mobility: Needs Assistance Bed Mobility: Rolling;Supine to Sit;Sit to Supine Rolling: Max assist   Supine to sit: Max assist;+2 for physical assistance;+2 for safety/equipment;HOB elevated Sit to supine: Max assist;+2 for physical assistance;+2 for safety/equipment   General bed mobility comments: Rolling x 4. Assist for trunk and LEs for supine<>sit. Pt sat EOB ~ 16minutes with Min-Mod assist for static sitting balance.   Transfers                 General transfer comment: NT-pt unable/too weak  Ambulation/Gait                 Stairs            Wheelchair Mobility    Modified Rankin (Stroke Patients Only)       Balance Overall balance assessment: Needs assistance Sitting-balance support: Bilateral upper extremity supported;Feet supported Sitting balance-Leahy Scale: Poor Sitting balance - Comments: Min-Mod assist for static sitting balance Postural control: Posterior lean                                  Cognition Arousal/Alertness: Awake/alert Behavior During Therapy: Flat affect Overall Cognitive Status: History of cognitive impairments - at baseline                                        Exercises General Exercises - Upper Extremity Shoulder Flexion: AROM;Both;5 reps;Seated Other Exercises Other Exercises: anterior weight shifting in sitting x 5 Attempted LAQ exercise bilaterally, but pt was unable to perform    General Comments        Pertinent Vitals/Pain Pain Assessment: No/denies pain    Home Living                      Prior Function            PT Goals (current goals can now be found in the care plan section) Progress towards PT goals: Progressing toward goals    Frequency    Min 2X/week      PT Plan Current plan remains appropriate    Co-evaluation              AM-PAC PT "6 Clicks" Daily Activity  Outcome Measure  Difficulty turning over in bed (including adjusting bedclothes, sheets and blankets)?: Unable Difficulty moving from lying on back to sitting on the side of the bed? :  Unable Difficulty sitting down on and standing up from a chair with arms (e.g., wheelchair, bedside commode, etc,.)?: Unable Help needed moving to and from a bed to chair (including a wheelchair)?: Total Help needed walking in hospital room?: Total Help needed climbing 3-5 steps with a railing? : Total 6 Click Score: 6    End of Session   Activity Tolerance: Patient tolerated treatment well Patient left: in bed;with call bell/phone within reach   PT Visit Diagnosis: History of falling (Z91.81);Muscle weakness (generalized) (M62.81);Other abnormalities of gait and mobility (R26.89)     Time: 1320-1330 PT Time Calculation (min) (ACUTE ONLY): 10 min  Charges:  $Therapeutic Activity: 8-22 mins                    G Codes:         Weston Anna, MPT Pager: [phone]

## 2017-03-23 NOTE — Progress Notes (Signed)
7 Days Post-Op    CC:  Sacral decubitus  Subjective: Ongoing dressing changes, pt has Elza Rafter in place and is incontinent of stool.    Objective: Vital signs in last 24 hours: Temp:  [98.4 F (36.9 C)-99.4 F (37.4 C)] 99.4 F (37.4 C) (12/24 0523) Pulse Rate:  [86-90] 86 (12/24 0523) Resp:  [18] 18 (12/24 0523) BP: (129-136)/(48-62) 129/48 (12/24 0523) SpO2:  [100 %] 100 % (12/24 0523) Last BM Date: 03/22/17  Intake/Output from previous day: 12/23 0701 - 12/24 0700 In: 360 [P.O.:360] Out: 1100 [Urine:1100] Intake/Output this shift: Total I/O In: -  Out: 700 [Urine:700]  General appearance: alert, cooperative and no distress Skin: good granulation tissue noted Less necrotic tissue than previously pictured   Lab Results:  Recent Labs    03/21/17 0629 03/22/17 0614  WBC 5.9 7.5  HGB 8.4* 8.5*  HCT 26.7* 27.0*  PLT 170 177    BMET Recent Labs    03/21/17 0629 03/22/17 0614  NA 136 136  K 4.1 4.4  CL 111 111  CO2 22 20*  GLUCOSE 77 79  BUN 10 10  CREATININE 0.79 0.71  CALCIUM 8.3* 8.3*   PT/INR No results for input(s): LABPROT, INR in the last 72 hours.  Recent Labs  Lab 03/20/17 0629  AST 30  ALT 11*  ALKPHOS 50  BILITOT 0.6  PROT 5.8*  ALBUMIN 1.7*     Lipase  No results found for: LIPASE   Medications: . collagenase   Topical BID  . feeding supplement (ENSURE ENLIVE)  237 mL Oral BID BM  . furosemide  20 mg Oral Daily  . gabapentin  100-200 mg Oral QHS  . insulin aspart  0-9 Units Subcutaneous TID WC  . linezolid  600 mg Oral Q12H  . multivitamin with minerals  1 tablet Oral Daily    Anti-infectives (From admission, onward)   Start     Dose/Rate Route Frequency Ordered Stop   03/17/17 1515  linezolid (ZYVOX) tablet 600 mg     600 mg Oral Every 12 hours 03/17/17 1509     03/16/17 1421  piperacillin-tazobactam (ZOSYN) 3.375 GM/50ML IVPB    Comments:  [place] Session   : cabinet override      03/16/17 1421 03/16/17 1549   03/15/17 0400  vancomycin (VANCOCIN) IVPB 750 mg/150 ml premix  Status:  Discontinued     750 mg 150 mL/hr over 60 Minutes Intravenous Every 24 hours 03/14/17 0111 03/17/17 1509   03/14/17 0800  clindamycin (CLEOCIN) IVPB 900 mg  Status:  Discontinued     900 mg 100 mL/hr over 30 Minutes Intravenous Every 8 hours 03/14/17 0216 03/14/17 1407   03/14/17 0600  piperacillin-tazobactam (ZOSYN) IVPB 3.375 g  Status:  Discontinued     3.375 g 12.5 mL/hr over 240 Minutes Intravenous Every 8 hours 03/14/17 0247 03/17/17 1509   03/13/17 2345  vancomycin (VANCOCIN) 1,500 mg in sodium chloride 0.9 % 500 mL IVPB     1,500 mg 250 mL/hr over 120 Minutes Intravenous  Once 03/13/17 2332 03/14/17 0305   03/13/17 2330  clindamycin (CLEOCIN) IVPB 600 mg     600 mg 100 mL/hr over 30 Minutes Intravenous  Once 03/13/17 2327 03/14/17 0045   03/13/17 2330  piperacillin-tazobactam (ZOSYN) IVPB 3.375 g     3.375 g 100 mL/hr over 30 Minutes Intravenous  Once 03/13/17 2327 03/14/17 0045      Assessment/Plan Sacral Decubitus Incision and debridement of sacral stage IV decubitus (15x20x3cm including  subcutaneous, fascia, muscle and periosteum) and left upper posterior thigh full thickness wound (3x2x2cm, subcutaneous)  Wheelchair bound/bedbound 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  Wound is cleaning up with hydrotherapy.  Will see again on Thur if she is still here.          LOS: 10 days    Leighton Ruff C. 95/32/0233 [phone]

## 2017-03-23 NOTE — Progress Notes (Signed)
PROGRESS NOTE    Julia Floyd  CZY:[id number] DOB: January 10, 1931 DOA: 03/13/2017 PCP: Josetta Huddle, MD   Brief Narrative:  54yow PMH stroke, DM, largely bedbound presented with increasing discharge from sacral wound and worsening right heel wound. CT abd/pelvis concerning fo necrotizing fasciitis. Admitted for IV abx, surgical evaluation. Lives at home with sister.  03/22/17: Patient seen. Also discussed with the Palliative care team. Palliative care is discussing with the patient's family. Nil new changes. Disposition will depend on the outcome between Palliative care team and the patient's family (I.e. Goal of care).  03/23/2017 - Patient seen. No new complaints. Awaiting disposition. Palliative care is liaising with the family.  Assessment & Plan:   Principal Problem:   Sacral osteomyelitis ([place]) Active Problems:   Stroke (cerebrum) ([place]) -  Suspect stroke/TIA s/p tPA with multifocal ICH - etiology unclear, concerning for underlying CAA   Benign essential HTN   Diabetes mellitus type 2 in nonobese (HCC)   Skin ulcer of sacrum with necrosis of muscle (HCC)   Normochromic normocytic anemia   Open wound of right heel   Aortic atherosclerosis (HCC)   Peripheral arterial disease ([place])  Goals of Care:  Family meeting with palliative.  Sacral wound unstagable, osteomyelitis, acute infection. S/p OR debridement 12/17 - BC 1/2 coag negative Staph. MRSA PCR negative. - continue abx as per ID. Per ID, if no associated aggressive wound care, nutrition, and diversion, recommending 2 weeks of oral linezolid (will need CBC twice weekly on linezolid) - per surgery goal is local wound control. Full wound-healing not anticipated given non-ambulatory status.  Fever:   - Continue to assess. Tmax is 99.4 - Follow work up (Repeat blood culture) - Continue antibiotics (PO Zyvox).  Right heel wound, pressure +/- ischemia. - ABI: Severe arterial occlusion on right foot. Critical arterial  occulusion on left foot.  - at this point in time, awaiting [place] discussion prior to [facility] consult - continue wound care per RN   Normocytic anemia. S/p 2 units PRBC 12/15. - follow clinically.   DM type 2 - CBG remains stable.   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  - not a candidate for antiplatelets or anticoagulation.  Dementia, mixed type, Alzheimers, vascular - stable   Multiple uterine fibroids possibly causing mild left-sided hydronephrosis Incidentally noted on CT scan. Outpatient follow-up.  Aortic atherosclerosis  - not currently on statin. Recommend outpatient f/u.  Hypokalemia: - Resolved  02/05/2017 Ortho Dr. Sharol Given office visit:  "Patient has significant peripheral vascular disease... Patient has thin atrophic skin to the right lower extremity there is no open ulcers no swelling. She does not have a palpable pulse". Follow-up in 4 weeks recommended.  Dr. Sarajane Jews began discussion regarding [place] previously. "Given sacral wound with osteo unlikely to heal w/o aggressive wound care, offloading and colostomy as well was bilateral LE PVD longterm prognosis is guarded. Would not recommend diversion colostomy. PMT/GOC discussion to workout also whether intervention for BLE chronic ischemia is desirable".  DVT prophylaxis: SCD Code Status: full  Family Communication: daughter at bedside Disposition Plan: pending [place] discussion which will be tomorrow   Consultants:   ID, surgery, palliative  Procedures: (Don't include imaging studies which can be auto populated. Include things that cannot be auto populated i.e. Echo, Carotid and venous dopplers, Foley, Bipap, HD, tubes/drains, wound vac, central lines etc)  2 units pRBC 12/15  12/17 Incision and  debridement of sacral stage IV decubitus (15x20x3cm including subcutaneous, fascia, muscle and periosteum)  and left upper posterior thigh full thickness wound (3x2x2cm, subcutaneous)  Antimicrobials: (specify start and planned stop date. Auto populated tables are space occupying and do not give end dates) Anti-infectives (From admission, onward)   Start     Dose/Rate Route Frequency Ordered Stop   03/17/17 1515  linezolid (ZYVOX) tablet 600 mg     600 mg Oral Every 12 hours 03/17/17 1509     03/16/17 1421  piperacillin-tazobactam (ZOSYN) 3.375 GM/50ML IVPB    Comments:  [place] Session   : cabinet override      03/16/17 1421 03/16/17 1549   03/15/17 0400  vancomycin (VANCOCIN) IVPB 750 mg/150 ml premix  Status:  Discontinued     750 mg 150 mL/hr over 60 Minutes Intravenous Every 24 hours 03/14/17 0111 03/17/17 1509   03/14/17 0800  clindamycin (CLEOCIN) IVPB 900 mg  Status:  Discontinued     900 mg 100 mL/hr over 30 Minutes Intravenous Every 8 hours 03/14/17 0216 03/14/17 1407   03/14/17 0600  piperacillin-tazobactam (ZOSYN) IVPB 3.375 g  Status:  Discontinued     3.375 g 12.5 mL/hr over 240 Minutes Intravenous Every 8 hours 03/14/17 0247 03/17/17 1509   03/13/17 2345  vancomycin (VANCOCIN) 1,500 mg in sodium chloride 0.9 % 500 mL IVPB     1,500 mg 250 mL/hr over 120 Minutes Intravenous  Once 03/13/17 2332 03/14/17 0305   03/13/17 2330  clindamycin (CLEOCIN) IVPB 600 mg     600 mg 100 mL/hr over 30 Minutes Intravenous  Once 03/13/17 2327 03/14/17 0045   03/13/17 2330  piperacillin-tazobactam (ZOSYN) IVPB 3.375 g     3.375 g 100 mL/hr over 30 Minutes Intravenous  Once 03/13/17 2327 03/14/17 0045      Subjective: No new complaints.    Objective: Vitals:   03/22/17 1457 03/22/17 2120 03/23/17 0523 03/23/17 1406  BP: 133/62 (!) 136/51 (!) 129/48 (!) 127/51  Pulse: 88 90 86 78  Resp: 18 18 18 18   Temp: 98.4 F (36.9 C) 99 F (37.2 C) 99.4 F (37.4 C) 99 F (37.2 C)  TempSrc: Oral Oral Oral Oral  SpO2: 100% 100% 100% 100%  Weight:      Height:        Intake/Output Summary  (Last 24 hours) at 03/23/2017 1818 Last data filed at 03/23/2017 1743 Gross per 24 hour  Intake 360 ml  Output 1550 ml  Net -1190 ml   Filed Weights   03/16/17 0622 03/17/17 0602 03/21/17 0655  Weight: 76.5 kg (168 lb 10.4 oz) 77.6 kg (171 lb 1.2 oz) 82.9 kg (182 lb 12.2 oz)    Examination:  General: No acute distress. Cardiovascular: Heart sounds show a regular rate, and rhythm. No gallops or rubs. No murmurs. No JVD. Lungs: Clear to auscultation bilaterally with good air movement. No rales, rhonchi or wheezes. Abdomen: Soft, nontender, nondistended with normal active bowel sounds. No masses. No hepatosplenomegaly. Neurological: Alert and oriented 3. Cranial nerves II through XII grossly intact. Skin: extensive decubitus ulcer, no visible purulence, appears stable compared with prior exams.  R heel with pressure/ischemic injury with eschar.  No purulence noted. Extremities: No clubbing or cyanosis. No edema.    Data Reviewed: I have personally reviewed following labs and imaging studies  CBC: Recent Labs  Lab 03/17/17 0531 03/20/17 0629 03/21/17 0629 03/22/17 0614  WBC 8.2 6.1 5.9 7.5  HGB 9.1* 8.5* 8.4* 8.5*  HCT 29.4*  27.4* 26.7* 27.0*  MCV 85.2 85.4 84.5 84.1  PLT 172 183 170 355   Basic Metabolic Panel: Recent Labs  Lab 03/17/17 0531 03/20/17 0629 03/21/17 0629 03/22/17 0614  NA 139 136 136 136  K 3.5 3.3* 4.1 4.4  CL 112* 109 111 111  CO2 24 24 22  20*  GLUCOSE 107* 83 77 79  BUN 14 11 10 10   CREATININE 0.81 0.80 0.79 0.71  CALCIUM 8.3* 8.4* 8.3* 8.3*  MG  --  1.6* 1.9  --    GFR: Estimated Creatinine Clearance: 56.8 mL/min (by C-G formula based on SCr of 0.71 mg/dL). Liver Function Tests: Recent Labs  Lab 03/20/17 0629  AST 30  ALT 11*  ALKPHOS 50  BILITOT 0.6  PROT 5.8*  ALBUMIN 1.7*   No results for input(s): LIPASE, AMYLASE in the last 168 hours. No results for input(s): AMMONIA in the last 168 hours. Coagulation Profile: No results for  input(s): INR, PROTIME in the last 168 hours. Cardiac Enzymes: No results for input(s): CKTOTAL, CKMB, CKMBINDEX, TROPONINI in the last 168 hours. BNP (last 3 results) No results for input(s): PROBNP in the last 8760 hours. HbA1C: No results for input(s): HGBA1C in the last 72 hours. CBG: Recent Labs  Lab 03/22/17 1656 03/22/17 2118 03/23/17 0736 03/23/17 1141 03/23/17 1639  GLUCAP 83 120* 78 124* 89   Lipid Profile: No results for input(s): CHOL, HDL, LDLCALC, TRIG, CHOLHDL, LDLDIRECT in the last 72 hours. Thyroid Function Tests: No results for input(s): TSH, T4TOTAL, FREET4, T3FREE, THYROIDAB in the last 72 hours. Anemia Panel: No results for input(s): VITAMINB12, FOLATE, FERRITIN, TIBC, IRON, RETICCTPCT in the last 72 hours. Sepsis Labs: No results for input(s): PROCALCITON, LATICACIDVEN in the last 168 hours.  Recent Results (from the past 240 hour(s))  Blood culture (routine x 2)     Status: None   Collection Time: 03/13/17  9:14 PM  Result Value Ref Range Status   Specimen Description BLOOD LEFT FOREARM  Final   Special Requests   Final    BOTTLES DRAWN AEROBIC AND ANAEROBIC Blood Culture adequate volume   Culture   Final    NO GROWTH 5 DAYS Performed at [facility], [address], [place], [place] [address]    Report Status 03/19/2017 FINAL  Final  Blood culture (routine x 2)     Status: Abnormal   Collection Time: 03/13/17 11:56 PM  Result Value Ref Range Status   Specimen Description BLOOD RIGHT HAND  Final   Special Requests IN PEDIATRIC BOTTLE Blood Culture adequate volume  Final   Culture  Setup Time   Final    GRAM POSITIVE COCCI IN CLUSTERS IN PEDIATRIC BOTTLE CRITICAL RESULT CALLED TO, READ BACK BY AND VERIFIED WITH: E JACKSON,PHARMD AT 2542 03/15/17 BY L BENFIELD    Culture (A)  Final    STAPHYLOCOCCUS SPECIES (COAGULASE NEGATIVE) THE SIGNIFICANCE OF ISOLATING THIS ORGANISM FROM A SINGLE SET OF BLOOD CULTURES WHEN MULTIPLE SETS ARE DRAWN IS  UNCERTAIN. PLEASE NOTIFY THE MICROBIOLOGY DEPARTMENT WITHIN ONE WEEK IF SPECIATION AND SENSITIVITIES ARE REQUIRED. Performed at [facility], [place] [address] [place], [place] [address]    Report Status 03/17/2017 FINAL  Final  Blood Culture ID Panel (Reflexed)     Status: Abnormal   Collection Time: 03/13/17 11:56 PM  Result Value Ref Range Status   Enterococcus species NOT DETECTED NOT DETECTED Final   Listeria monocytogenes NOT DETECTED NOT DETECTED Final   Staphylococcus species DETECTED (  A) NOT DETECTED Final    Comment: Methicillin (oxacillin) resistant coagulase negative staphylococcus. Possible blood culture contaminant (unless isolated from more than one blood culture draw or clinical case suggests pathogenicity). No antibiotic treatment is indicated for blood  culture contaminants. CRITICAL RESULT CALLED TO, READ BACK BY AND VERIFIED WITH: E JACKSON,PHARMD AT 4431 03/15/17 BY L BENFIELD    Staphylococcus aureus NOT DETECTED NOT DETECTED Final   Methicillin resistance DETECTED (A) NOT DETECTED Final    Comment: CRITICAL RESULT CALLED TO, READ BACK BY AND VERIFIED WITH: E JACKSON,PHARMD AT 0714 03/15/17 BY L BENFIELD    Streptococcus species NOT DETECTED NOT DETECTED Final   Streptococcus agalactiae NOT DETECTED NOT DETECTED Final   Streptococcus pneumoniae NOT DETECTED NOT DETECTED Final   Streptococcus pyogenes NOT DETECTED NOT DETECTED Final   Acinetobacter baumannii NOT DETECTED NOT DETECTED Final   Enterobacteriaceae species NOT DETECTED NOT DETECTED Final   Enterobacter cloacae complex NOT DETECTED NOT DETECTED Final   Escherichia coli NOT DETECTED NOT DETECTED Final   Klebsiella oxytoca NOT DETECTED NOT DETECTED Final   Klebsiella pneumoniae NOT DETECTED NOT DETECTED Final   Proteus species NOT DETECTED NOT DETECTED Final   Serratia marcescens NOT DETECTED NOT DETECTED Final   Haemophilus influenzae NOT DETECTED NOT DETECTED Final   Neisseria meningitidis NOT  DETECTED NOT DETECTED Final   Pseudomonas aeruginosa NOT DETECTED NOT DETECTED Final   Candida albicans NOT DETECTED NOT DETECTED Final   Candida glabrata NOT DETECTED NOT DETECTED Final   Candida krusei NOT DETECTED NOT DETECTED Final   Candida parapsilosis NOT DETECTED NOT DETECTED Final   Candida tropicalis NOT DETECTED NOT DETECTED Final    Comment: Performed at [facility], [place], [place], [place] [address]  MRSA PCR Screening     Status: None   Collection Time: 03/16/17  1:37 PM  Result Value Ref Range Status   MRSA by PCR NEGATIVE NEGATIVE Final    Comment:        The GeneXpert MRSA Assay (FDA approved for NASAL specimens only), is one component of a comprehensive MRSA colonization surveillance program. It is not intended to diagnose MRSA infection nor to guide or monitor treatment for MRSA infections.   Culture, blood (routine x 2)     Status: None (Preliminary result)   Collection Time: 03/21/17  4:42 PM  Result Value Ref Range Status   Specimen Description BLOOD LEFT WRIST  Final   Special Requests   Final    BOTTLES DRAWN AEROBIC AND ANAEROBIC Blood Culture adequate volume   Culture   Final    NO GROWTH 1 DAY Performed at [facility], [place] [address] [place], [place] [address]    Report Status PENDING  Incomplete  Culture, blood (routine x 2)     Status: None (Preliminary result)   Collection Time: 03/21/17  4:48 PM  Result Value Ref Range Status   Specimen Description BLOOD LEFT FOREARM  Final   Special Requests IN PEDIATRIC BOTTLE Blood Culture adequate volume  Final   Culture   Final    NO GROWTH 1 DAY Performed at [facility], [place] [address], [place], [place] [address]    Report Status PENDING  Incomplete  Culture, Urine     Status: None   Collection Time: 03/21/17  7:28 PM  Result Value Ref Range Status   Specimen Description URINE, RANDOM  Final   Special Requests NONE  Final   Culture   Final  NO GROWTH Performed at  [facility], [place] [address], [place], [place] [address]    Report Status 03/23/2017 FINAL  Final         Radiology Studies: No results found.      Scheduled Meds: . collagenase   Topical BID  . feeding supplement (ENSURE ENLIVE)  237 mL Oral BID BM  . furosemide  20 mg Oral Daily  . gabapentin  100-200 mg Oral QHS  . insulin aspart  0-9 Units Subcutaneous TID WC  . linezolid  600 mg Oral Q12H  . multivitamin with minerals  1 tablet Oral Daily   Continuous Infusions:   LOS: 10 days    Time spent: over 20 minutes    Bonnell Public, MD [facility] Pager [phone]  If 7PM-7AM, please contact night-coverage [email or web address] Password [email or web address] 03/23/2017, 6:18 PM

## 2017-03-23 NOTE — Progress Notes (Signed)
Date:  March 23, 2017 Chart reviewed for concurrent status and case management needs.  Will continue to follow patient progress.  Discharge Planning: following for needs  Expected discharge date: December 247 2018  Ebrahim Deremer, BSN, RN3, CCM   [phone]  

## 2017-03-23 NOTE — Progress Notes (Signed)
HYDROTHERAPY TREATMENT      03/23/17 1400  Subjective Assessment  Subjective "okay"  Patient and Family Stated Goals none stated  Date of Onset (unknown-POA)  Evaluation and Treatment  Evaluation and Treatment Procedures Explained to Patient/Family Yes  Evaluation and Treatment Procedures agreed to  Pressure Injury 03/17/17 Unstageable - Full thickness tissue loss in which the base of the ulcer is covered by slough (yellow, tan, gray, green or brown) and/or eschar (tan, brown or black) in the wound bed. *PT*  Date First Assessed/Time First Assessed: 03/17/17 1340   Location: Ischial tuberosity  Location Orientation: Left  Staging: Unstageable - Full thickness tissue loss in which the base of the ulcer is covered by slough (yellow, tan, gray, green or brown...  Dressing Type ABD;Gauze (Comment);Moist to moist (Santyl)  Dressing Changed  Dressing Change Frequency Twice a day  State of Healing Non-healing  Site / Wound Assessment Pink;Yellow  % Wound base Red or Granulating 50%  % Wound base Yellow/Fibrinous Exudate 50%  Peri-wound Assessment Excoriated;Pink  Wound Length (cm) 2.5 cm  Wound Width (cm) 3 cm  Wound Depth (cm) 1.8 cm  Wound Surface Area (cm^2) 7.5 cm^2  Wound Volume (cm^3) 13.5 cm^3  Margins Unattached edges (unapproximated)  Drainage Amount Minimal  Drainage Description Serosanguineous  Treatment Debridement (Selective);Hydrotherapy (Pulse lavage);Packing (Saline gauze) (Santyl)  Pressure Injury 03/17/17 Stage IV - Full thickness tissue loss with exposed bone, tendon or muscle. *PT*  S/P I&D 03/16/17  Date First Assessed/Time First Assessed: 03/17/17 1340   Location: Sacrum  Staging: Stage IV - Full thickness tissue loss with exposed bone, tendon or muscle.  Wound Description (Comments): *PT*  S/P I&D 03/16/17  Present on Admission: Yes  Dressing Type ABD;Gauze (Comment);Moist to moist (Santyl)  Dressing Changed  Dressing Change Frequency Twice a day  State of  Healing Non-healing  Site / Wound Assessment Black;Brown;Pink;Yellow  % Wound base Red or Granulating 45%  % Wound base Yellow/Fibrinous Exudate 10%  % Wound base Black/Eschar 45%  Peri-wound Assessment (mostly intact, small area in/near gluteal cleft)  Wound Length (cm) 17 cm  Wound Width (cm) 23 cm  Wound Depth (cm) 3 cm  Wound Surface Area (cm^2) 391 cm^2  Wound Volume (cm^3) 1173 cm^3  Undermining (cm) (12:00 2.5 cm)  Margins Unattached edges (unapproximated)  Drainage Amount Moderate  Drainage Description Serosanguineous  Treatment Debridement (Selective);Hydrotherapy (Pulse lavage);Packing (Saline gauze) (Santyl)  Hydrotherapy  Pulsed lavage therapy - wound location L Ischial Tuberosity and Sacrum  Pulsed Lavage with Suction (psi) 8 psi  Pulsed Lavage with Suction - Normal Saline Used 1000 mL  Pulsed Lavage Tip Tip with splash shield  Selective Debridement  Selective Debridement - Location sacrum  Selective Debridement - Tools Used Forceps;Scissors  Selective Debridement - Tissue Removed brown and yellow tissue  Wound Therapy - Assess/Plan/Recommendations  Wound Therapy - Clinical Statement 83 yo female admitted from home with multiple wounds including sacral wound found to have osteomyelitis. S/P Incision and Drainage 03/16/17. Hx of CVA, dementia, DM.   Wound Therapy - Functional Problem List Limited mobility due to hx of CVA and recent fall in 09/2016 (per pt). Long periods of time spent sitting in wheelchair without pressure redistribution  Factors Delaying/Impairing Wound Healing Diabetes Mellitus;Incontinence;Immobility;Infection - systemic/local  Hydrotherapy Plan Debridement;Dressing change;Patient/family education;Pulsatile lavage with suction  Wound Therapy - Frequency 6X / week  Wound Therapy - Current Recommendations Case manager/social work  Wound Therapy - Follow Up Recommendations Skilled nursing facility  Wound Plan Continue hydrotherapy, selective  and enzymatic  debridement  Wound Therapy Goals - Improve the function of patient's integumentary system by progressing the wound(s) through the phases of wound healing by:  Decrease Necrotic Tissue to 25%  Decrease Necrotic Tissue - Progress Progressing toward goal  Increase Granulation Tissue to 75%  Increase Granulation Tissue - Progress Progressing toward goal  Improve Drainage Characteristics Min  Improve Drainage Characteristics - Progress Progressing toward goal  Goals/treatment plan/discharge plan were made with and agreed upon by patient/family Yes  Time For Goal Achievement 2 weeks  Wound Therapy - Potential for Goals Poor    Julia Floyd, MPT [phone]

## 2017-03-24 DIAGNOSIS — Z7189 Other specified counseling: Secondary | ICD-10-CM

## 2017-03-24 DIAGNOSIS — Z515 Encounter for palliative care: Secondary | ICD-10-CM

## 2017-03-24 LAB — GLUCOSE, CAPILLARY
GLUCOSE-CAPILLARY: 72 mg/dL (ref 65–99)
GLUCOSE-CAPILLARY: 89 mg/dL (ref 65–99)
Glucose-Capillary: 96 mg/dL (ref 65–99)
Glucose-Capillary: 82 mg/dL (ref 65–99)

## 2017-03-24 MED ORDER — COLLAGENASE 250 UNIT/GM EX OINT
TOPICAL_OINTMENT | Freq: Three times a day (TID) | CUTANEOUS | Status: DC
  Administered 2017-03-24 – 2017-03-30 (×15): via TOPICAL
  Filled 2017-03-24 (×2): qty 90

## 2017-03-24 NOTE — Progress Notes (Signed)
Palliative care progress note  Reason for consult: Goals of care in light of nonhealing wounds  I met today with Ms. Paolillo and also discussed with grand daughter over the phone as well.   BP (!) 112/45 (BP Location: Right Arm)   Pulse 85   Temp 97.7 F (36.5 C) (Axillary)   Resp 18   Ht 5' 6" (1.676 m)   Wt 82.9 kg (182 lb 12.2 oz)   SpO2 100%   BMI 29.50 kg/m  Labs and imaging noted Records from PT and wound care also noted.   Patient resting in bed, awake alert, pleasant S1 S2 Clear Has wound in back Has wound on leg Awake alert  Sacral osteomyelitis Heel wound History of HTN dementia CVA  PLAN:   As per my discussions with patient and grand daughter over the phone,  discharge will be to SNF for localized wound care and hydrotherapy. Continue PRN tylenol for pain. Patient likely going to [facility] for rehab/wound therapy soon.   Will recommend palliative care to follow over there, to continue to monitor disease trajectory and also to continue to address code status and overall goals of care.     Total time: 25 minutes Greater than 50%  of this time was spent counseling and coordinating care related to the above assessment and plan. [phone]   , MD [place] Palliative Medicine Team [phone]  

## 2017-03-24 NOTE — Progress Notes (Signed)
PROGRESS NOTE    Julia Floyd  JJO:[id number] DOB: 01-11-1931 DOA: 03/13/2017 PCP: Josetta Huddle, MD    Brief Narrative: 29yow PMH stroke, DM, largely bedbound presented with increasing discharge from sacral wound and worsening right heel wound. CT abd/pelvis concerning fo necrotizing fasciitis. Admitted for IV abx, surgical evaluation.Lives at home with sister.    Assessment & Plan:   Principal Problem:   Sacral osteomyelitis ([place]) Active Problems:   Stroke (cerebrum) ([place]) -  Suspect stroke/TIA s/p tPA with multifocal ICH - etiology unclear, concerning for underlying CAA   Benign essential HTN   Diabetes mellitus type 2 in nonobese (HCC)   Skin ulcer of sacrum with necrosis of muscle (HCC)   Normochromic normocytic anemia   Open wound of right heel   Aortic atherosclerosis ([place])   Peripheral arterial disease ([place])   Encounter for palliative care   Goals of care, counseling/discussion   Sacral wound unstagable, osteomyelitis, acute infection. S/p OR debridement 12/17 - BC 1/2 coag negative Staph. MRSA PCR negative. - continue abxas per ID. Per ID, if no associated aggressive wound care, nutrition, and diversion, recommending 2 weeks of oral linezolid (will need CBC twice weekly on linezolid) - per surgery goal is local wound control. Full wound-healing not anticipated given non-ambulatory status.   Right heel pressure wound possible sec to ischemia.  ABI; severe arterial occlusion on the right foot.  Critical arterial occulusion on left foot.  - at this point in time, awaiting GOC discussion prior to VVS consult -continuewound care per RN    Normocytic anemia:  S/p  2 units of prbc transfusion repeat H&H is stable.   Type 2 DM: CBG (last 3)  Recent Labs    03/23/17 2235 03/24/17 0736 03/24/17 1102  GLUCAP 99 72 96   RESUME SSI.   Hypertension Well controlled.    H/o CVA s/p TPA;  S/P intracranial hemorrhage.   Dementia:  Stable.  Dr.  Sarajane Jews began discussion regarding [place] previously. "Given sacral wound with osteo unlikely to heal w/o aggressive wound care, offloading and colostomy as well was bilateral LE PVD longterm prognosis is guarded. Would not recommend diversion colostomy. PMT/GOC discussion to workout also whether intervention for BLE chronic ischemia is desirable".        DVT prophylaxis: scd's Code Status: ( full code.  Family Communication:none at bedside.  Disposition Plan: pending palliative care consult.    Consultants:   Palliative care.   Orthopedics.   surgery   Procedures:   2 units pRBC 12/15  12/17 Incision and debridement of sacral stage IV decubitus (15x20x3cm including subcutaneous, fascia, muscle and periosteum) and left upper posterior thigh full thickness wound (3x2x2cm, subcutaneous)      Antimicrobials: zyvox   Subjective:  No new complaints.   Objective: Vitals:   03/24/17 0413 03/24/17 0759 03/24/17 1202 03/24/17 1611  BP: (!) 145/55 (!) 112/44 (!) 112/45 (!) 150/57  Pulse: 86 69 85 77  Resp: 18 18 18 16   Temp: 99 F (37.2 C) 98.7 F (37.1 C) 97.7 F (36.5 C) 97.8 F (36.6 C)  TempSrc: Oral Oral Axillary Axillary  SpO2: 99% 100% 100% 100%  Weight:      Height:        Intake/Output Summary (Last 24 hours) at 03/24/2017 1621 Last data filed at 03/24/2017 1612 Gross per 24 hour  Intake 240 ml  Output 650 ml  Net -410 ml   Filed Weights   03/16/17 0622 03/17/17 0602 03/21/17 0655  Weight: 76.5  kg (168 lb 10.4 oz) 77.6 kg (171 lb 1.2 oz) 82.9 kg (182 lb 12.2 oz)    Examination:  General exam: Appears calm and comfortable  Respiratory system: Clear to auscultation. Respiratory effort normal. Cardiovascular system: S1 & S2 heard, RRR. No JVD, murmurs, rubs, gallops or clicks. No pedal edema. Gastrointestinal system: Abdomen is nondistended, soft and nontender. No organomegaly or masses felt. Normal bowel sounds heard. Central nervous system:  Alert and oriented. No focal neurological deficits. Extremities: Symmetric 5 x 5 power. Skin: extensive decubitus ulcer, no visible purulence, appears stable compared with prior exams.  R heel with pressure/ischemic injury with eschar.  No purulence noted.   Psychiatry: Judgement and insight appear normal. Mood & affect appropriate.     Data Reviewed: I have personally reviewed following labs and imaging studies  CBC: Recent Labs  Lab 03/20/17 0629 03/21/17 0629 03/22/17 0614  WBC 6.1 5.9 7.5  HGB 8.5* 8.4* 8.5*  HCT 27.4* 26.7* 27.0*  MCV 85.4 84.5 84.1  PLT 183 170 676   Basic Metabolic Panel: Recent Labs  Lab 03/20/17 0629 03/21/17 0629 03/22/17 0614  NA 136 136 136  K 3.3* 4.1 4.4  CL 109 111 111  CO2 24 22 20*  GLUCOSE 83 77 79  BUN 11 10 10   CREATININE 0.80 0.79 0.71  CALCIUM 8.4* 8.3* 8.3*  MG 1.6* 1.9  --    GFR: Estimated Creatinine Clearance: 56.8 mL/min (by C-G formula based on SCr of 0.71 mg/dL). Liver Function Tests: Recent Labs  Lab 03/20/17 0629  AST 30  ALT 11*  ALKPHOS 50  BILITOT 0.6  PROT 5.8*  ALBUMIN 1.7*   No results for input(s): LIPASE, AMYLASE in the last 168 hours. No results for input(s): AMMONIA in the last 168 hours. Coagulation Profile: No results for input(s): INR, PROTIME in the last 168 hours. Cardiac Enzymes: No results for input(s): CKTOTAL, CKMB, CKMBINDEX, TROPONINI in the last 168 hours. BNP (last 3 results) No results for input(s): PROBNP in the last 8760 hours. HbA1C: No results for input(s): HGBA1C in the last 72 hours. CBG: Recent Labs  Lab 03/23/17 1141 03/23/17 1639 03/23/17 2235 03/24/17 0736 03/24/17 1102  GLUCAP 124* 89 99 72 96   Lipid Profile: No results for input(s): CHOL, HDL, LDLCALC, TRIG, CHOLHDL, LDLDIRECT in the last 72 hours. Thyroid Function Tests: No results for input(s): TSH, T4TOTAL, FREET4, T3FREE, THYROIDAB in the last 72 hours. Anemia Panel: No results for input(s): VITAMINB12,  FOLATE, FERRITIN, TIBC, IRON, RETICCTPCT in the last 72 hours. Sepsis Labs: No results for input(s): PROCALCITON, LATICACIDVEN in the last 168 hours.  Recent Results (from the past 240 hour(s))  MRSA PCR Screening     Status: None   Collection Time: 03/16/17  1:37 PM  Result Value Ref Range Status   MRSA by PCR NEGATIVE NEGATIVE Final    Comment:        The GeneXpert MRSA Assay (FDA approved for NASAL specimens only), is one component of a comprehensive MRSA colonization surveillance program. It is not intended to diagnose MRSA infection nor to guide or monitor treatment for MRSA infections.   Culture, blood (routine x 2)     Status: None (Preliminary result)   Collection Time: 03/21/17  4:42 PM  Result Value Ref Range Status   Specimen Description BLOOD LEFT WRIST  Final   Special Requests   Final    BOTTLES DRAWN AEROBIC AND ANAEROBIC Blood Culture adequate volume   Culture   Final  NO GROWTH 2 DAYS Performed at [facility], [place] [address] [place], [place] [address]    Report Status PENDING  Incomplete  Culture, blood (routine x 2)     Status: None (Preliminary result)   Collection Time: 03/21/17  4:48 PM  Result Value Ref Range Status   Specimen Description BLOOD LEFT FOREARM  Final   Special Requests IN PEDIATRIC BOTTLE Blood Culture adequate volume  Final   Culture   Final    NO GROWTH 2 DAYS Performed at [facility], [place] [address] [place], [place] [address]    Report Status PENDING  Incomplete  Culture, Urine     Status: None   Collection Time: 03/21/17  7:28 PM  Result Value Ref Range Status   Specimen Description URINE, RANDOM  Final   Special Requests NONE  Final   Culture   Final    NO GROWTH Performed at [facility], [place] [address] [place], [place] [address]    Report Status 03/23/2017 FINAL  Final         Radiology Studies: No results found.      Scheduled Meds: . feeding supplement (ENSURE ENLIVE)  237 mL Oral  BID BM  . furosemide  20 mg Oral Daily  . gabapentin  100-200 mg Oral QHS  . insulin aspart  0-9 Units Subcutaneous TID WC  . linezolid  600 mg Oral Q12H  . multivitamin with minerals  1 tablet Oral Daily   Continuous Infusions:   LOS: 11 days    Time spent: 35 minutes.     Hosie Poisson, MD [facility] Pager [phone]  If 7PM-7AM, please contact night-coverage [email or web address] Password [email or web address] 03/24/2017, 4:21 PM

## 2017-03-25 LAB — GLUCOSE, CAPILLARY
GLUCOSE-CAPILLARY: 73 mg/dL (ref 65–99)
GLUCOSE-CAPILLARY: 115 mg/dL — AB (ref 65–99)
GLUCOSE-CAPILLARY: 117 mg/dL — AB (ref 65–99)
GLUCOSE-CAPILLARY: 113 mg/dL — AB (ref 65–99)

## 2017-03-25 NOTE — Progress Notes (Signed)
9 Days Post-Op    CC:  decubitus  Subjective: Doing okay.  No pain.  Incontinent of stool.  Objective: Vital signs in last 24 hours: Temp:  [97.7 F (36.5 C)-99.9 F (37.7 C)] 99.2 F (37.3 C) (12/26 0544) Pulse Rate:  [77-89] 83 (12/26 0544) Resp:  [16-18] 18 (12/26 0544) BP: (112-150)/(45-62) 139/62 (12/26 0544) SpO2:  [100 %] 100 % (12/26 0544) Last BM Date: 03/22/17  Intake/Output from previous day: 12/25 0701 - 12/26 0700 In: 600 [P.O.:600] Out: 650 [Urine:650] Intake/Output this shift: Total I/O In: 240 [P.O.:240] Out: -   Left buttocks wound  15 x 17 cm - goes from midline to left side.  Small 4 cm ulcer below the main ulcer.  The wound is clean.  Has 70% granulation tissue.  She is laying on the left side.   [photo in Epic from 03/19/2017]  Lab Results:  No results for input(s): WBC, HGB, HCT, PLT in the last 72 hours.  BMET No results for input(s): NA, K, CL, CO2, GLUCOSE, BUN, CREATININE, CALCIUM in the last 72 hours. PT/INR No results for input(s): LABPROT, INR in the last 72 hours.  Recent Labs  Lab 03/20/17 0629  AST 30  ALT 11*  ALKPHOS 50  BILITOT 0.6  PROT 5.8*  ALBUMIN 1.7*     Lipase  No results found for: LIPASE   Medications: . collagenase   Topical TID  . feeding supplement (ENSURE ENLIVE)  237 mL Oral BID BM  . furosemide  20 mg Oral Daily  . gabapentin  100-200 mg Oral QHS  . insulin aspart  0-9 Units Subcutaneous TID WC  . linezolid  600 mg Oral Q12H  . multivitamin with minerals  1 tablet Oral Daily    Anti-infectives (From admission, onward)   Start     Dose/Rate Route Frequency Ordered Stop   03/17/17 1515  linezolid (ZYVOX) tablet 600 mg     600 mg Oral Every 12 hours 03/17/17 1509     03/16/17 1421  piperacillin-tazobactam (ZOSYN) 3.375 GM/50ML IVPB    Comments:  Waldron Session   : cabinet override      03/16/17 1421 03/16/17 1549   03/15/17 0400  vancomycin (VANCOCIN) IVPB 750 mg/150 ml premix  Status:   Discontinued     750 mg 150 mL/hr over 60 Minutes Intravenous Every 24 hours 03/14/17 0111 03/17/17 1509   03/14/17 0800  clindamycin (CLEOCIN) IVPB 900 mg  Status:  Discontinued     900 mg 100 mL/hr over 30 Minutes Intravenous Every 8 hours 03/14/17 0216 03/14/17 1407   03/14/17 0600  piperacillin-tazobactam (ZOSYN) IVPB 3.375 g  Status:  Discontinued     3.375 g 12.5 mL/hr over 240 Minutes Intravenous Every 8 hours 03/14/17 0247 03/17/17 1509   03/13/17 2345  vancomycin (VANCOCIN) 1,500 mg in sodium chloride 0.9 % 500 mL IVPB     1,500 mg 250 mL/hr over 120 Minutes Intravenous  Once 03/13/17 2332 03/14/17 0305   03/13/17 2330  clindamycin (CLEOCIN) IVPB 600 mg     600 mg 100 mL/hr over 30 Minutes Intravenous  Once 03/13/17 2327 03/14/17 0045   03/13/17 2330  piperacillin-tazobactam (ZOSYN) IVPB 3.375 g     3.375 g 100 mL/hr over 30 Minutes Intravenous  Once 03/13/17 2327 03/14/17 0045      Assessment/Plan Sacral Decubitus Incision and debridement of sacral stage IV decubitus (15x17x3cm including subcutaneous, fascia, muscle and periosteum) and left upper posterior thigh full thickness wound (3x2x2cm, subcutaneous)  Debrided by Dr. Kae Heller on 03/16/2017.  Wheelchair bound/bedbound after 2 strokes/intracrania hemorrage Normocytic anemia - hgb 9.8 this AM HTN T2 Diabetes Mellitus Left heal ulcer/PAD Hx of stroke - stable Multiple uterine fibroids  SXQ:[id number] II VTE: SCDs ID: On Linezolid 12/18 >>>  Follow up:  Wound care clinic/SNF  Continue hydrotherapy.   Will follow.   Long term, will need placement.  I don't see her sister taking care of her at home.  Alphonsa Overall, MD, [facility] Pager: [phone] Office phone:  [phone]

## 2017-03-25 NOTE — Progress Notes (Signed)
Nutrition Follow-up  DOCUMENTATION CODES:   Not applicable  INTERVENTION:    Ensure Enlive po BID, each supplement provides 350 kcal and 20 grams of protein  Provide MVI daily  NUTRITION DIAGNOSIS:   Increased nutrient needs related to wound healing as evidenced by estimated needs.  Ongoing  GOAL:   Patient will meet greater than or equal to 90% of their needs  Progressing  MONITOR:   PO intake, Supplement acceptance, Weight trends, Labs, I & O's  REASON FOR ASSESSMENT:   Malnutrition Screening Tool    ASSESSMENT:   83 year old African-American female with a past medical history of stroke, history of intracranial hemorrhage status post TPA, hypertension, diabetes who is largely bedbound and was brought into the hospital as patient was experiencing increasing discharge from her sacral wound.  There was also concern about right heel wound.  Evaluation in this emergency department raised concern for necrotizing fasciitis.   Pt reports appetite is fair. Meal completions charted as 25% average for last 6 meals. Pt ate 50% of breakfast this morning that contained oatmeal, scrambled eggs, and magic cup. Reports not receiving Ensure this morning but would like to continue with them. Weight noted to be up by 14 lb since last RD visit, suspect this is fluid related. Will continue to monitor trends.   Palliative spoke with grand daughter over the phone 12/25. Goal of care is to discharge to SNF for wound care and hydrotherapy with palliative following.   Medications reviewed and include: lasix, SSI, MVI with minerals Labs reviewed.   Diet Order:  DIET DYS 2 Room service appropriate? Yes; Fluid consistency: Thin  EDUCATION NEEDS:   Not appropriate for education at this time  Skin:  Skin Assessment: Skin Integrity Issues: Skin Integrity Issues:: Unstageable, Other (Comment) Unstageable: left heel ulcer Other: sacral wound  Last BM:  03/22/17  Height:   Ht Readings from  Last 1 Encounters:  03/14/17 5\' 6"  (1.676 m)    Weight:   Wt Readings from Last 1 Encounters:  03/21/17 182 lb 12.2 oz (82.9 kg)    Ideal Body Weight:  59.1 kg  BMI:  Body mass index is 29.5 kg/m.  Estimated Nutritional Needs:   Kcal:  1900-2100  Protein:  90-100g  Fluid:  2L/day    Mariana Single RD, LDN Clinical Nutrition Pager # - [phone]

## 2017-03-25 NOTE — Progress Notes (Signed)
No new change from prior nurse's assessment. Will continue to assess patient.

## 2017-03-25 NOTE — Progress Notes (Addendum)
03/25/17 1512 Hydrotherapy note  Subjective Assessment  Subjective "okay"- have a nice day  Patient and Family Stated Goals none stated  Date of Onset (unknown-POA)  Evaluation and Treatment  Evaluation and Treatment Procedures Explained to Patient/Family Yes  Evaluation and Treatment Procedures agreed to  Pressure Injury 03/17/17 Unstageable - Full thickness tissue loss in which the base of the ulcer is covered by slough (yellow, tan, gray, green or brown) and/or eschar (tan, brown or black) in the wound bed. *PT*  Date First Assessed/Time First Assessed: 03/17/17 1340   Location: Ischial tuberosity  Location Orientation: Left  Staging: Unstageable - Full thickness tissue loss in which the base of the ulcer is covered by slough (yellow, tan, gray, green or brown...  Dressing Type ABD;Gauze (Comment);Moist to moist (Santyl)  Dressing Changed  Dressing Change Frequency Twice a day  State of Healing Non-healing  Site / Wound Assessment Pink;Yellow  % Wound base Red or Granulating 70%  % Wound base Yellow/Fibrinous Exudate 30%  Peri-wound Assessment Excoriated;Pink  Wound Length (cm) 2.5 cm  Wound Width (cm) 3 cm  Wound Depth (cm) 1.8 cm  Wound Surface Area (cm^2) 7.5 cm^2  Wound Volume (cm^3) 13.5 cm^3  Margins Unattached edges (unapproximated)  Drainage Amount Minimal  Drainage Description Serosanguineous  Treatment Cleansed;Hydrotherapy (Pulse lavage);Packing (Saline gauze)  Pressure Injury 03/17/17 Stage IV - Full thickness tissue loss with exposed bone, tendon or muscle. *PT*  S/P I&D 03/16/17  Date First Assessed/Time First Assessed: 03/17/17 1340   Location: Sacrum  Staging: Stage IV - Full thickness tissue loss with exposed bone, tendon or muscle.  Wound Description (Comments): *PT*  S/P I&D 03/16/17  Present on Admission: Yes  Dressing Type ABD;Gauze (Comment);Moist to moist (Santyl)  Dressing Changed  Dressing Change Frequency Twice a day  State of Healing Non-healing   Site / Wound Assessment Black;Brown;Pink;Yellow  % Wound base Red or Granulating 45%  % Wound base Yellow/Fibrinous Exudate 10%  % Wound base Black/Eschar 45%  Peri-wound Assessment (mostly intact, small area in/near gluteal cleft)  Wound Length (cm) 17 cm  Wound Width (cm) 23 cm  Wound Depth (cm) 3 cm  Wound Surface Area (cm^2) 391 cm^2  Wound Volume (cm^3) 1173 cm^3  Margins Unattached edges (unapproximated)  Drainage Amount Moderate  Drainage Description Serosanguineous  Treatment Cleansed;Debridement (Selective);Hydrotherapy (Pulse lavage);Packing (Saline gauze)  Hydrotherapy  Pulsed lavage therapy - wound location L Ischial Tuberosity and Sacrum  Pulsed Lavage with Suction (psi) 8 psi  Pulsed Lavage with Suction - Normal Saline Used 1000 mL  Pulsed Lavage Tip Tip with splash shield  Selective Debridement  Selective Debridement - Location sacrum  Selective Debridement - Tools Used Forceps;Scissors  Selective Debridement - Tissue Removed brown and yellow tissue  Wound Therapy - Assess/Plan/Recommendations  Wound Therapy - Clinical Statement 83 yo female admitted from home with multiple wounds including sacral wound found to have osteomyelitis. S/P Incision and Drainage 03/16/17. Hx of CVA, dementia, DM. Frequent oozing of BM and malfubction of PUREwick for urine, bed and dressing soiled today. The smaller ischial wound is improving. The larger wound has fibrinous slough.  Dr. Lucia Gaskins in to inspect the wounds.   Wound Therapy - Functional Problem List Limited mobility due to hx of CVA and recent fall in 09/2016 (per pt). Long periods of time spent sitting in wheelchair without pressure redistribution  Factors Delaying/Impairing Wound Healing Diabetes Mellitus;Incontinence;Immobility;Infection - systemic/local  Hydrotherapy Plan Debridement;Dressing change;Patient/family education;Pulsatile lavage with suction  Wound Therapy - Frequency 6X / week  Wound Therapy - Current  Recommendations Case manager/social work  Wound Therapy - Follow Up Recommendations Skilled nursing facility  Wound Plan Continue hydrotherapy, selective and enzymatic debridement  Wound Therapy Goals - Improve the function of patient's integumentary system by progressing the wound(s) through the phases of wound healing by:  Decrease Necrotic Tissue to 25%  Decrease Necrotic Tissue - Progress Progressing toward goal  Increase Granulation Tissue to 75%  Increase Granulation Tissue - Progress Progressing toward goal  Improve Drainage Characteristics Min  Improve Drainage Characteristics - Progress Progressing toward goal  Goals/treatment plan/discharge plan were made with and agreed upon by patient/family Yes  Time For Goal Achievement 2 weeks  Wound Therapy - Potential for Goals Poor  Tresa Endo PT [phone]

## 2017-03-25 NOTE — Progress Notes (Signed)
PROGRESS NOTE    Julia Floyd  LPF:[id number] DOB: 1930-10-19 DOA: 03/13/2017 PCP: Josetta Huddle, MD Brief Narrative: 49yow PMH stroke, DM, largely bedbound presented with increasing discharge from sacral wound and worsening right heel wound. CT abd/pelvis concerning fo necrotizing fasciitis. Admitted for IV abx, surgical evaluation.Lives at home with sister.    Assessment & Plan:   Principal Problem:   Sacral osteomyelitis ([place]) Active Problems:   Stroke (cerebrum) ([place]) -  Suspect stroke/TIA s/p tPA with multifocal ICH - etiology unclear, concerning for underlying CAA   Benign essential HTN   Diabetes mellitus type 2 in nonobese (HCC)   Skin ulcer of sacrum with necrosis of muscle (HCC)   Normochromic normocytic anemia   Open wound of right heel   Aortic atherosclerosis ([place])   Peripheral arterial disease ([place])   Encounter for palliative care   Goals of care, counseling/discussion  Sacral wound unstagable, osteomyelitis, acute infection. S/p OR debridement 12/17 - BC 1/2 coag negative Staph. MRSA PCR negative. - continue abxas per ID. Per ID, if no associated aggressive wound care, nutrition, and diversion, recommending 2 weeks of oral linezolid (will need CBC twice weekly on linezolid) - per surgery goal is local wound control. Full wound-healing not anticipated given non-ambulatory status.   Right heel pressure wound possible sec to ischemia.  ABI; severe arterial occlusion on the right foot.  Critical arterial occulusion on left foot.  - at this point in time, awaiting GOC discussion prior to VVS consult -continuewound care per RN    Normocytic anemia:  S/p  2 units of prbc transfusion repeat H&H is stable.   Type 2 DM: CBG (last 3)  RecentLabs(last2labs)  Recent Labs    03/23/17 2235 03/24/17 0736 03/24/17 1102  GLUCAP 99 72 96     RESUME SSI.   Hypertension Well controlled.   H/o CVA s/p TPA;  S/P intracranial hemorrhage.          DVT prophylaxis: scd Code Status:full Family Communication:none Disposition Plantbd Consultants: Ortho,surgery,palliative care  Procedures: 2 units pRBC 12/15  12/17 Incision and debridement of sacral stage IV decubitus (15x20x3cm including subcutaneous, fascia, muscle and periosteum) and left upper posterior thigh full thickness wound (3x2x2cm, subcutaneous)    Antimicrobials:zyvox Subjective: No complaints  Objective:resting in bed in nad Vitals:   03/24/17 1202 03/24/17 1611 03/24/17 2042 03/25/17 0544  BP: (!) 112/45 (!) 150/57 (!) 148/56 139/62  Pulse: 85 77 89 83  Resp: 18 16 18 18   Temp: 97.7 F (36.5 C) 97.8 F (36.6 C) 99.9 F (37.7 C) 99.2 F (37.3 C)  TempSrc: Axillary Axillary Oral Oral  SpO2: 100% 100% 100% 100%  Weight:      Height:        Intake/Output Summary (Last 24 hours) at 03/25/2017 0815 Last data filed at 03/24/2017 2351 Gross per 24 hour  Intake 480 ml  Output 650 ml  Net -170 ml   Filed Weights   03/16/17 0622 03/17/17 0602 03/21/17 0655  Weight: 76.5 kg (168 lb 10.4 oz) 77.6 kg (171 lb 1.2 oz) 82.9 kg (182 lb 12.2 oz)    Examination:  General exam: Appears calm and comfortable  Respiratory system: Clear to auscultation. Respiratory effort normal. Cardiovascular system: S1 & S2 heard, RRR. No JVD, murmurs, rubs, gallops or clicks. No pedal edema. Gastrointestinal system: Abdomen is nondistended, soft and nontender. No organomegaly or masses felt. Normal bowel sounds heard. Central nervous system: Alert and oriented. No focal neurological deficits. Extremities: Symmetric 5 x 5  power. Skin: No rashes, lesions or ulcers      Data Reviewed: I have personally reviewed following labs and imaging studies  CBC: Recent Labs  Lab 03/20/17 0629 03/21/17 0629 03/22/17 0614  WBC 6.1 5.9 7.5  HGB 8.5* 8.4* 8.5*  HCT 27.4* 26.7* 27.0*  MCV 85.4 84.5 84.1  PLT 183 170 382   Basic Metabolic Panel: Recent Labs  Lab  03/20/17 0629 03/21/17 0629 03/22/17 0614  NA 136 136 136  K 3.3* 4.1 4.4  CL 109 111 111  CO2 24 22 20*  GLUCOSE 83 77 79  BUN 11 10 10   CREATININE 0.80 0.79 0.71  CALCIUM 8.4* 8.3* 8.3*  MG 1.6* 1.9  --    GFR: Estimated Creatinine Clearance: 56.8 mL/min (by C-G formula based on SCr of 0.71 mg/dL). Liver Function Tests: Recent Labs  Lab 03/20/17 0629  AST 30  ALT 11*  ALKPHOS 50  BILITOT 0.6  PROT 5.8*  ALBUMIN 1.7*   No results for input(s): LIPASE, AMYLASE in the last 168 hours. No results for input(s): AMMONIA in the last 168 hours. Coagulation Profile: No results for input(s): INR, PROTIME in the last 168 hours. Cardiac Enzymes: No results for input(s): CKTOTAL, CKMB, CKMBINDEX, TROPONINI in the last 168 hours. BNP (last 3 results) No results for input(s): PROBNP in the last 8760 hours. HbA1C: No results for input(s): HGBA1C in the last 72 hours. CBG: Recent Labs  Lab 03/24/17 0736 03/24/17 1102 03/24/17 1706 03/24/17 2039 03/25/17 0733  GLUCAP 72 96 82 89 73   Lipid Profile: No results for input(s): CHOL, HDL, LDLCALC, TRIG, CHOLHDL, LDLDIRECT in the last 72 hours. Thyroid Function Tests: No results for input(s): TSH, T4TOTAL, FREET4, T3FREE, THYROIDAB in the last 72 hours. Anemia Panel: No results for input(s): VITAMINB12, FOLATE, FERRITIN, TIBC, IRON, RETICCTPCT in the last 72 hours. Sepsis Labs: No results for input(s): PROCALCITON, LATICACIDVEN in the last 168 hours.  Recent Results (from the past 240 hour(s))  MRSA PCR Screening     Status: None   Collection Time: 03/16/17  1:37 PM  Result Value Ref Range Status   MRSA by PCR NEGATIVE NEGATIVE Final    Comment:        The GeneXpert MRSA Assay (FDA approved for NASAL specimens only), is one component of a comprehensive MRSA colonization surveillance program. It is not intended to diagnose MRSA infection nor to guide or monitor treatment for MRSA infections.   Culture, blood (routine x  2)     Status: None (Preliminary result)   Collection Time: 03/21/17  4:42 PM  Result Value Ref Range Status   Specimen Description BLOOD LEFT WRIST  Final   Special Requests   Final    BOTTLES DRAWN AEROBIC AND ANAEROBIC Blood Culture adequate volume   Culture   Final    NO GROWTH 2 DAYS Performed at [facility], [address], [place], [place] [address]    Report Status PENDING  Incomplete  Culture, blood (routine x 2)     Status: None (Preliminary result)   Collection Time: 03/21/17  4:48 PM  Result Value Ref Range Status   Specimen Description BLOOD LEFT FOREARM  Final   Special Requests IN PEDIATRIC BOTTLE Blood Culture adequate volume  Final   Culture   Final    NO GROWTH 2 DAYS Performed at [facility], [place] [address], [place], [place] [address]    Report Status PENDING  Incomplete  Culture, Urine  Status: None   Collection Time: 03/21/17  7:28 PM  Result Value Ref Range Status   Specimen Description URINE, RANDOM  Final   Special Requests NONE  Final   Culture   Final    NO GROWTH Performed at [facility], [address], [place], [place] [address]    Report Status 03/23/2017 FINAL  Final         Radiology Studies: No results found.      Scheduled Meds: . collagenase   Topical TID  . feeding supplement (ENSURE ENLIVE)  237 mL Oral BID BM  . furosemide  20 mg Oral Daily  . gabapentin  100-200 mg Oral QHS  . insulin aspart  0-9 Units Subcutaneous TID WC  . linezolid  600 mg Oral Q12H  . multivitamin with minerals  1 tablet Oral Daily   Continuous Infusions:   LOS: 12 days    Georgette Shell, MD [facility] If 7PM-7AM, please contact night-coverage [email or web address] Password [email or web address] 03/25/2017, 8:15 AM

## 2017-03-26 LAB — GLUCOSE, CAPILLARY
Glucose-Capillary: 130 mg/dL — ABNORMAL HIGH (ref 65–99)
Glucose-Capillary: 84 mg/dL (ref 65–99)
Glucose-Capillary: 91 mg/dL (ref 65–99)
Glucose-Capillary: 80 mg/dL (ref 65–99)
Glucose-Capillary: 105 mg/dL — ABNORMAL HIGH (ref 65–99)

## 2017-03-26 NOTE — Progress Notes (Signed)
PROGRESS NOTE    Julia Floyd  PIR:[id number] DOB: November 12, 1930 DOA: 03/13/2017 PCP: Josetta Huddle, MD Brief Narrative: 89yow PMH stroke, DM, largely bedbound presented with increasing discharge from sacral wound and worsening right heel wound. CT abd/pelvis concerning fo necrotizing fasciitis. Admitted for IV abx, surgical evaluation.Lives at home with sister.    Assessment & Plan:   Principal Problem:   Sacral osteomyelitis ([place]) Active Problems:   Stroke (cerebrum) ([place]) -  Suspect stroke/TIA s/p tPA with multifocal ICH - etiology unclear, concerning for underlying CAA   Benign essential HTN   Diabetes mellitus type 2 in nonobese (HCC)   Skin ulcer of sacrum with necrosis of muscle (HCC)   Normochromic normocytic anemia   Open wound of right heel   Aortic atherosclerosis ([place])   Peripheral arterial disease ([place])   Encounter for palliative care   Goals of care, counseling/discussion  Sacral wound unstagable, osteomyelitis, acute infection. S/p OR debridement 12/17 - BC 1/2 coag negative Staph. MRSA PCR negative. - continue abxas per ID. Per ID, if no associated aggressive wound care, nutrition, and diversion, recommending 2 weeks of oral linezolid (will need CBC twice weekly on linezolid) - per surgery goal is local wound control. Full wound-healing not anticipated given non-ambulatory status. - Pursue disposition  Right heel pressure wound possible sec to ischemia.  ABI; severe arterial occlusion on the right foot.  Critical arterial occulusion on left foot.  - at this point in time, awaiting GOC discussion prior to VVS consult -continuewound care per RN    Normocytic anemia:  S/p  2 units of prbc transfusion repeat H&H is stable.   Type 2 DM: CBG (last 3)  RecentLabs(last2labs)       Recent Labs    03/23/17 2235 03/24/17 0736 03/24/17 1102  GLUCAP 99 72 96     RESUME SSI.   Hypertension Well controlled.   H/o CVA s/p TPA;  S/P  intracranial hemorrhage.         DVT prophylaxis: scd Code Status:full Family Communication:none Disposition Plantbd Consultants: Ortho,surgery,palliative care  Procedures: 2 units pRBC 12/15  12/17 Incision and debridement of sacral stage IV decubitus (15x20x3cm including subcutaneous, fascia, muscle and periosteum) and left upper posterior thigh full thickness wound (3x2x2cm, subcutaneous)    Antimicrobials:zyvox Subjective: No complaints  Objective:resting in bed in nad Vitals:   03/25/17 1420 03/25/17 2124 03/26/17 0511 03/26/17 1448  BP: 139/78 (!) 131/51 (!) 128/50 (!) 93/45  Pulse: 92 93 85 84  Resp: 18 18 18 18   Temp: 99.6 F (37.6 C) 99.5 F (37.5 C) 98.1 F (36.7 C) 98.6 F (37 C)  TempSrc: Oral Oral Oral Oral  SpO2: 100% 100% 100% 100%  Weight:      Height:        Intake/Output Summary (Last 24 hours) at 03/26/2017 1555 Last data filed at 03/26/2017 1200 Gross per 24 hour  Intake 480 ml  Output -  Net 480 ml   Filed Weights   03/16/17 0622 03/17/17 0602 03/21/17 0655  Weight: 76.5 kg (168 lb 10.4 oz) 77.6 kg (171 lb 1.2 oz) 82.9 kg (182 lb 12.2 oz)    Examination:  General exam: Appears calm and comfortable  Respiratory system: Clear to auscultation. Respiratory effort normal. Cardiovascular system: S1 & S2 heard, RRR. No JVD, murmurs, rubs, gallops or clicks. No pedal edema. Gastrointestinal system: Abdomen is nondistended, soft and nontender. No organomegaly or masses felt. Normal bowel sounds heard. Central nervous system: Alert and oriented. No focal neurological  deficits. Extremities: Symmetric 5 x 5 power. Skin: No rashes, lesions or ulcers      Data Reviewed: I have personally reviewed following labs and imaging studies  CBC: Recent Labs  Lab 03/20/17 0629 03/21/17 0629 03/22/17 0614  WBC 6.1 5.9 7.5  HGB 8.5* 8.4* 8.5*  HCT 27.4* 26.7* 27.0*  MCV 85.4 84.5 84.1  PLT 183 170 580   Basic Metabolic Panel: Recent Labs    Lab 03/20/17 0629 03/21/17 0629 03/22/17 0614  NA 136 136 136  K 3.3* 4.1 4.4  CL 109 111 111  CO2 24 22 20*  GLUCOSE 83 77 79  BUN 11 10 10   CREATININE 0.80 0.79 0.71  CALCIUM 8.4* 8.3* 8.3*  MG 1.6* 1.9  --    GFR: Estimated Creatinine Clearance: 56.8 mL/min (by C-G formula based on SCr of 0.71 mg/dL). Liver Function Tests: Recent Labs  Lab 03/20/17 0629  AST 30  ALT 11*  ALKPHOS 50  BILITOT 0.6  PROT 5.8*  ALBUMIN 1.7*   No results for input(s): LIPASE, AMYLASE in the last 168 hours. No results for input(s): AMMONIA in the last 168 hours. Coagulation Profile: No results for input(s): INR, PROTIME in the last 168 hours. Cardiac Enzymes: No results for input(s): CKTOTAL, CKMB, CKMBINDEX, TROPONINI in the last 168 hours. BNP (last 3 results) No results for input(s): PROBNP in the last 8760 hours. HbA1C: No results for input(s): HGBA1C in the last 72 hours. CBG: Recent Labs  Lab 03/25/17 1134 03/25/17 1637 03/25/17 2156 03/26/17 0732 03/26/17 1208  GLUCAP 115* 117* 113* 84 91   Lipid Profile: No results for input(s): CHOL, HDL, LDLCALC, TRIG, CHOLHDL, LDLDIRECT in the last 72 hours. Thyroid Function Tests: No results for input(s): TSH, T4TOTAL, FREET4, T3FREE, THYROIDAB in the last 72 hours. Anemia Panel: No results for input(s): VITAMINB12, FOLATE, FERRITIN, TIBC, IRON, RETICCTPCT in the last 72 hours. Sepsis Labs: No results for input(s): PROCALCITON, LATICACIDVEN in the last 168 hours.  Recent Results (from the past 240 hour(s))  Culture, blood (routine x 2)     Status: None (Preliminary result)   Collection Time: 03/21/17  4:42 PM  Result Value Ref Range Status   Specimen Description BLOOD LEFT WRIST  Final   Special Requests   Final    BOTTLES DRAWN AEROBIC AND ANAEROBIC Blood Culture adequate volume   Culture   Final    NO GROWTH 4 DAYS Performed at [facility], [address] [place], [place] [address]    Report Status PENDING   Incomplete  Culture, blood (routine x 2)     Status: None (Preliminary result)   Collection Time: 03/21/17  4:48 PM  Result Value Ref Range Status   Specimen Description BLOOD LEFT FOREARM  Final   Special Requests IN PEDIATRIC BOTTLE Blood Culture adequate volume  Final   Culture   Final    NO GROWTH 4 DAYS Performed at [facility], [place] [address], [place], [place] [address]    Report Status PENDING  Incomplete  Culture, Urine     Status: None   Collection Time: 03/21/17  7:28 PM  Result Value Ref Range Status   Specimen Description URINE, RANDOM  Final   Special Requests NONE  Final   Culture   Final    NO GROWTH Performed at [facility], [place] [address] [place], [place] [address]    Report Status 03/23/2017 FINAL  Final  Radiology Studies: No results found.      Scheduled Meds: . collagenase   Topical TID  . feeding supplement (ENSURE ENLIVE)  237 mL Oral BID BM  . furosemide  20 mg Oral Daily  . gabapentin  100-200 mg Oral QHS  . insulin aspart  0-9 Units Subcutaneous TID WC  . linezolid  600 mg Oral Q12H  . multivitamin with minerals  1 tablet Oral Daily   Continuous Infusions:   LOS: 13 days    Bonnell Public, MD [facility] If 7PM-7AM, please contact night-coverage [email or web address] Password [facility] 03/26/2017, 3:55 PM

## 2017-03-26 NOTE — Progress Notes (Signed)
Hydrotherapy note 03/26/17 1300  Subjective Assessment  Subjective is it better?  Patient and Family Stated Goals none stated  Date of Onset (unknown-POA)  Evaluation and Treatment  Evaluation and Treatment Procedures Explained to Patient/Family Yes  Evaluation and Treatment Procedures agreed to  Pressure Injury 03/17/17 Unstageable - Full thickness tissue loss in which the base of the ulcer is covered by slough (yellow, tan, gray, green or brown) and/or eschar (tan, brown or black) in the wound bed. *PT*  Date First Assessed/Time First Assessed: 03/17/17 1340   Location: Ischial tuberosity  Location Orientation: Left  Staging: Unstageable - Full thickness tissue loss in which the base of the ulcer is covered by slough (yellow, tan, gray, green or brown...  Dressing Type ABD;Gauze (Comment);Moist to moist (S[place]yl)  Dressing Changed  Dressing Change Frequency Twice a day  State of Healing Non-healing  Site / Wound Assessment Pink;Yellow  % Wound base Red or Granulating 70%  % Wound base Yellow/Fibrinous Exudate 30%  Peri-wound Assessment Excoriated;Pink  Wound Length (cm) 2.5 cm  Wound Width (cm) 3 cm  Wound Depth (cm) 1.8 cm  Wound Surface Area (cm^2) 7.5 cm^2  Wound Volume (cm^3) 13.5 cm^3  Margins Unattached edges (unapproximated)  Drainage Amount Minimal  Drainage Description Serosanguineous  Pressure Injury 03/17/17 Stage IV - Full thickness tissue loss with exposed bone, tendon or muscle. *PT*  S/P I&D 03/16/17  Date First Assessed/Time First Assessed: 03/17/17 1340   Location: Sacrum  Staging: Stage IV - Full thickness tissue loss with exposed bone, tendon or muscle.  Wound Description (Comments): *PT*  S/P I&D 03/16/17  Present on Admission: Yes  Dressing Type ABD;Gauze (Comment);Moist to moist (Santyl)  Dressing Changed  Dressing Change Frequency Twice a day  State of Healing Non-healing  Site / Wound Assessment Black;Brown;Pink;Yellow  % Wound base Red or Granulating 45%   % Wound base Yellow/Fibrinous Exudate 10%  % Wound base Black/Eschar 45%  Peri-wound Assessment (mostly intact, small area in/near gluteal cleft)  Wound Length (cm) 17 cm  Wound Width (cm) 23 cm  Wound Depth (cm) 3 cm  Wound Surface Area (cm^2) 391 cm^2  Wound Volume (cm^3) 1173 cm^3  Margins Unattached edges (unapproximated)  Drainage Amount Moderate  Drainage Description Serosanguineous  Hydrotherapy  Pulsed lavage therapy - wound location L Ischial Tuberosity and Sacrum  Pulsed Lavage with Suction (psi) 8 psi  Pulsed Lavage with Suction - Normal Saline Used 1000 mL  Pulsed Lavage Tip Tip with splash shield  Selective Debridement  Selective Debridement - Location sacrum  Selective Debridement - Tools Used Forceps;Scissors  Selective Debridement - Tissue Removed brown and yellow tissue  Wound Therapy - Assess/Plan/Recommendations  Wound Therapy - Clinical Statement 83 yo female admitted from home with multiple wounds including sacral wound found to have osteomyelitis. S/P Incision and Drainage 03/16/17. Hx of CVA, dementia, DM. No BM today.  Wound Therapy - Functional Problem List Limited mobility due to hx of CVA and recent fall in 09/2016 (per pt). Long periods of time spent sitting in wheelchair without pressure redistribution  Factors Delaying/Impairing Wound Healing Diabetes Mellitus;Incontinence;Immobility;Infection - systemic/local  Hydrotherapy Plan Debridement;Dressing change;Patient/family education;Pulsatile lavage with suction  Wound Therapy - Frequency 6X / week  Wound Therapy - Current Recommendations Case manager/social work  Wound Therapy - Follow Up Recommendations Skilled nursing facility  Wound Plan Continue hydrotherapy, selective and enzymatic debridement  Wound Therapy Goals - Improve the function of patient's integumentary system by progressing the wound(s) through the phases of wound healing  by:  Decrease Necrotic Tissue to 25%  Decrease Necrotic Tissue -  Progress Progressing toward goal  Increase Granulation Tissue to 75%  Increase Granulation Tissue - Progress Progressing toward goal  Improve Drainage Characteristics Min  Improve Drainage Characteristics - Progress Progressing toward goal  Goals/treatment plan/discharge plan were made with and agreed upon by patient/family Yes  Time For Goal Achievement 2 weeks  Wound Therapy - Potential for Goals Poor  Tresa Endo PT [phone]

## 2017-03-27 DIAGNOSIS — I998 Other disorder of circulatory system: Secondary | ICD-10-CM

## 2017-03-27 DIAGNOSIS — M4628 Osteomyelitis of vertebra, sacral and sacrococcygeal region: Secondary | ICD-10-CM

## 2017-03-27 DIAGNOSIS — I10 Essential (primary) hypertension: Secondary | ICD-10-CM

## 2017-03-27 DIAGNOSIS — T148XXA Other injury of unspecified body region, initial encounter: Secondary | ICD-10-CM

## 2017-03-27 DIAGNOSIS — E08621 Diabetes mellitus due to underlying condition with foot ulcer: Secondary | ICD-10-CM

## 2017-03-27 DIAGNOSIS — I700 Atherosclerosis of aorta: Secondary | ICD-10-CM

## 2017-03-27 DIAGNOSIS — L089 Local infection of the skin and subcutaneous tissue, unspecified: Secondary | ICD-10-CM

## 2017-03-27 DIAGNOSIS — L98423 Non-pressure chronic ulcer of back with necrosis of muscle: Secondary | ICD-10-CM

## 2017-03-27 DIAGNOSIS — E119 Type 2 diabetes mellitus without complications: Secondary | ICD-10-CM

## 2017-03-27 DIAGNOSIS — I6300 Cerebral infarction due to thrombosis of unspecified precerebral artery: Secondary | ICD-10-CM

## 2017-03-27 DIAGNOSIS — I739 Peripheral vascular disease, unspecified: Secondary | ICD-10-CM

## 2017-03-27 DIAGNOSIS — D649 Anemia, unspecified: Secondary | ICD-10-CM

## 2017-03-27 DIAGNOSIS — L97413 Non-pressure chronic ulcer of right heel and midfoot with necrosis of muscle: Secondary | ICD-10-CM

## 2017-03-27 LAB — CBC
HCT: 24.9 % — ABNORMAL LOW (ref 36.0–46.0)
Hemoglobin: 7.9 g/dL — ABNORMAL LOW (ref 12.0–15.0)
MCH: 26.4 pg (ref 26.0–34.0)
MCHC: 31.7 g/dL (ref 30.0–36.0)
MCV: 83.3 fL (ref 78.0–100.0)
Platelets: 113 10*3/uL — ABNORMAL LOW (ref 150–400)
RBC: 2.99 MIL/uL — ABNORMAL LOW (ref 3.87–5.11)
RDW: 16.8 % — ABNORMAL HIGH (ref 11.5–15.5)
WBC: 6.2 10*3/uL (ref 4.0–10.5)

## 2017-03-27 LAB — GLUCOSE, CAPILLARY
Glucose-Capillary: 70 mg/dL (ref 65–99)
Glucose-Capillary: 96 mg/dL (ref 65–99)
Glucose-Capillary: 101 mg/dL — ABNORMAL HIGH (ref 65–99)

## 2017-03-27 LAB — CULTURE, BLOOD (ROUTINE X 2)
Culture: NO GROWTH
Culture: NO GROWTH
SPECIAL REQUESTS: ADEQUATE
SPECIAL REQUESTS: ADEQUATE

## 2017-03-27 NOTE — Progress Notes (Signed)
Physical Therapy Discharge Patient Details Name: SHANEY DECKMAN MRN: [id number] DOB: 04-30-1930 Today's Date: 03/27/2017 Time:  -     Patient discharged from PT services secondary to maximum benefit from Hydrotherapy and mobility achieved. Plans for wet to dry dressing by nursing. Dc to SNF.  goals met and no further PT needs identified.  Please see latest therapy progress note for current level of functioning and progress toward goals.    Progress and discharge plan discussed with patient and/or caregiver: NA GP     Claretha Cooper 03/27/2017, 5:30 PM Tresa Endo PT [phone]

## 2017-03-27 NOTE — Progress Notes (Signed)
Date: March 27, 2017 Rhonda Davis, BSN, RN3, CCM [phone] Chart and notes review for patient progress and needs. Will follow for case management and discharge needs. Next review date: [id number] 

## 2017-03-27 NOTE — Progress Notes (Signed)
03/27/17 1700  Hyrotherapy note [id number]  Subjective Assessment   Subjective patient able to give  therapist recipe for Skillet cornbread and collards.  Patient and Family Stated Goals none stated  Date of Onset (unknown-POA)  Evaluation and Treatment  Evaluation and Treatment Procedures Explained to Patient/Family Yes  Evaluation and Treatment Procedures agreed to  Pressure Injury 03/17/17 Unstageable - Full thickness tissue loss in which the base of the ulcer is covered by slough (yellow, tan, gray, green or brown) and/or eschar (tan, brown or black) in the wound bed. *PT*  Date First Assessed/Time First Assessed: 03/17/17 1340   Location: Ischial tuberosity  Location Orientation: Left  Staging: Unstageable - Full thickness tissue loss in which the base of the ulcer is covered by slough (yellow, tan, gray, green or brown...  Dressing Type ABD;Gauze (Comment);Moist to moist (Santyl)  Dressing Changed  Dressing Change Frequency Twice a day  State of Healing Non-healing  Site / Wound Assessment Pink;Yellow  % Wound base Red or Granulating 75%  % Wound base Yellow/Fibrinous Exudate 25%  Peri-wound Assessment Excoriated;Pink  Wound Length (cm) 2.5 cm  Wound Width (cm) 3 cm  Wound Depth (cm) 1.5 cm  Wound Surface Area (cm^2) 7.5 cm^2  Wound Volume (cm^3) 11.25 cm^3  Margins Unattached edges (unapproximated)  Drainage Amount Minimal  Drainage Description Serosanguineous  Treatment Cleansed;Debridement (Selective);Hydrotherapy (Pulse lavage) (minimal slough debrided in undermining)  Pressure Injury 03/17/17 Stage IV - Full thickness tissue loss with exposed bone, tendon or muscle. *PT*  S/P I&D 03/16/17  Date First Assessed/Time First Assessed: 03/17/17 1340   Location: Sacrum  Staging: Stage IV - Full thickness tissue loss with exposed bone, tendon or muscle.  Wound Description (Comments): *PT*  S/P I&D 03/16/17  Present on Admission: Yes  Dressing Type ABD;Gauze (Comment);Moist to  moist (Santyl)  Dressing Changed  Dressing Change Frequency Twice a day  State of Healing Non-healing  Site / Wound Assessment Black;Brown;Pink;Yellow  % Wound base Red or Granulating 50%  % Wound base Yellow/Fibrinous Exudate 50%  Peri-wound Assessment (mostly intact, small area in/near gluteal cleft)  Wound Length (cm) 17 cm  Wound Width (cm) 23 cm  Wound Depth (cm) 3 cm  Wound Surface Area (cm^2) 391 cm^2  Wound Volume (cm^3) 1173 cm^3  Margins Unattached edges (unapproximated)  Drainage Amount Moderate  Drainage Description Serosanguineous  Treatment Debridement (Selective);Hydrotherapy (Pulse lavage);Packing (Saline gauze) (santyl)  Hydrotherapy  Pulsed lavage therapy - wound location sacral  Pulsed Lavage with Suction (psi) 8 psi  Pulsed Lavage with Suction - Normal Saline Used 1000 mL  Pulsed Lavage Tip Tip with splash shield  Selective Debridement  Selective Debridement - Location sacrum  Selective Debridement - Tools Used Forceps;Scissors  Selective Debridement - Tissue Removed brown and yellow tissue  Wound Therapy - Assess/Plan/Recommendations  Wound Therapy - Clinical Statement 83 yo female admitted from home with multiple wounds including sacral wound found to have osteomyelitis. S/P Incision and Drainage 03/16/17.[name] from [place] into inspect wound and collaborated with Dr. Lucia Gaskins, who indicates wound can be managed by wet to dry dressings and no further debridement is indicated. PT will discontinue Hydrotherapy.   Wound Therapy - Functional Problem List Limited mobility due to hx of CVA and recent fall in 09/2016 (per pt). Long periods of time spent sitting in wheelchair without pressure redistribution  Factors Delaying/Impairing Wound Healing Diabetes Mellitus;Incontinence;Immobility;Infection - systemic/local  Hydrotherapy Plan Debridement;Dressing change;Patient/family education;Pulsatile lavage with suction  Wound Therapy - Frequency 6X / week  Wound Therapy -  Current Recommendations Case manager/social work  Wound Therapy - Follow Up Recommendations Skilled nursing facility  Wound Plan Continue hydrotherapy, selective and enzymatic debridement  Wound Therapy Goals - Improve the function of patient's integumentary system by progressing the wound(s) through the phases of wound healing by:  Decrease Necrotic Tissue to 25%  Decrease Necrotic Tissue - Progress Progressing toward goal  Increase Granulation Tissue to 75%  Increase Granulation Tissue - Progress Progressing toward goal  Improve Drainage Characteristics Min  Improve Drainage Characteristics - Progress Progressing toward goal  Goals/treatment plan/discharge plan were made with and agreed upon by patient/family Yes  Time For Goal Achievement (maximum benefit achieved for wound. )  Wound Therapy - Potential for Goals Poor  Tresa Endo PT [phone]

## 2017-03-27 NOTE — Progress Notes (Addendum)
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 Kae Heller on 03/16/2017.  ID: On Linezolid 12/18 >>> Continue hydrotherapy. We will follow.  Recommend SNF placement when medically stable. Wet to dry dressings at discharge. Pt ready for discharge from a surgical standpoint.     LOS: 14 days    Subjective: Pt doing okay. Home health nurse and social worker at bedside. Home health nurse asked what the plan is for wound care at discharge and I stated I was not sure at this point. She informed me that the pt lives with her daughter who also has had a stroke.   I would expect that there is no way a family can take care of Julia Floyd.  Her decubiti is clean - needs daily damp to dry dressing changes. There is no more debridement needed.  Objective: Vital signs in last 24 hours: Temp:  [98.6 F (37 C)-99.3 F (37.4 C)] 99.3 F (37.4 C) (12/27 2143) Pulse Rate:  [84-85] 85 (12/27 2143) Resp:  [18] 18 (12/27 2143) BP: (93-123)/(45-56) 123/56 (12/27 2143) SpO2:  [100 %] 100 % (12/27 2143) Last BM Date: 03/25/17  Intake/Output from previous day: 12/27 0701 - 12/28 0700 In: 360 [P.O.:360] Out: -  Intake/Output this shift: No intake/output data recorded.  PE: Gen:  Alert, NAD, pleasant, cooperative Pulm:  Rate and effort normal Skin: see wound below     Anti-infectives: Anti-infectives (From admission, onward)   Start     Dose/Rate Route Frequency Ordered Stop   03/17/17 1515  linezolid (ZYVOX) tablet 600 mg     600 mg Oral  Every 12 hours 03/17/17 1509     03/16/17 1421  piperacillin-tazobactam (ZOSYN) 3.375 GM/50ML IVPB    Comments:  Waldron Session   : cabinet override      03/16/17 1421 03/16/17 1549   03/15/17 0400  vancomycin (VANCOCIN) IVPB 750 mg/150 ml premix  Status:  Discontinued     750 mg 150 mL/hr over 60 Minutes Intravenous Every 24 hours 03/14/17 0111 03/17/17 1509   03/14/17 0800  clindamycin (CLEOCIN) IVPB 900 mg  Status:  Discontinued     900 mg 100 mL/hr over 30 Minutes Intravenous Every 8 hours 03/14/17 0216 03/14/17 1407   03/14/17 0600  piperacillin-tazobactam (ZOSYN) IVPB 3.375 g  Status:  Discontinued     3.375 g 12.5 mL/hr over 240 Minutes Intravenous Every 8 hours 03/14/17 0247 03/17/17 1509   03/13/17 2345  vancomycin (VANCOCIN) 1,500 mg in sodium chloride 0.9 % 500 mL IVPB     1,500 mg 250 mL/hr over 120 Minutes Intravenous  Once 03/13/17 2332 03/14/17 0305   03/13/17 2330  clindamycin (CLEOCIN) IVPB 600 mg     600 mg 100 mL/hr over 30 Minutes Intravenous  Once 03/13/17 2327 03/14/17 0045   03/13/17 2330  piperacillin-tazobactam (ZOSYN) IVPB 3.375 g     3.375 g 100 mL/hr over 30 Minutes Intravenous  Once 03/13/17 2327 03/14/17 0045      Lab Results:  Recent Labs    03/27/17 0534  WBC 6.2  HGB 7.9*  HCT 24.9*  PLT 113*   BMET No results for input(s): NA, K, CL, CO2, GLUCOSE, BUN, CREATININE, CALCIUM in the last 72 hours. PT/INR No results for input(s): LABPROT, INR in the last 72 hours. CMP     Component Value Date/Time   NA 136 03/22/2017 0614   K 4.4 03/22/2017 0614   CL 111 03/22/2017 0614   CO2 20 (L) 03/22/2017 0614   GLUCOSE 79 03/22/2017 0614   BUN 10 03/22/2017 0614   CREATININE 0.71 03/22/2017 0614   CALCIUM 8.3 (L) 03/22/2017 0614   PROT 5.8 (L) 03/20/2017 0629   ALBUMIN 1.7 (L) 03/20/2017 0629   AST 30 03/20/2017 0629   ALT 11 (L) 03/20/2017 0629   ALKPHOS 50 03/20/2017 0629   BILITOT 0.6 03/20/2017 0629   GFRNONAA >60 03/22/2017 0614   GFRAA  >60 03/22/2017 0614   Lipase  No results found for: LIPASE  Studies/Results: No results found.    Kalman Drape , [facility] Surgery 03/27/2017, 2:27 PM Pager: [phone]  Agree with above.  I would expect that there is no way a family can take care of Julia Floyd.  Her decubiti is clean - needs daily damp to dry dressing changes. There is no more debridement needed.  Alphonsa Overall, MD, [facility] Surgery Pager: [phone] Office phone:  [phone]

## 2017-03-27 NOTE — Progress Notes (Signed)
Palliative Medicine RN Note: Rec'd a call from patient's granddaughter Julia Floyd [phone]. She is asking about SNF placement and reporting that she hasn't gotten a call from SW yet about where Julia Floyd will be placed. I left a message for the SW listed on AMION requesting she call Julia Floyd.  Marjie Skiff Hazel Wrinkle, RN, BSN, [facility] 03/27/2017 1:45 PM Office [phone]

## 2017-03-27 NOTE — Progress Notes (Signed)
PROGRESS NOTE  TIENA MANANSALA  PZW:[id number] DOB: [birth year] DOA: 03/13/2017 PCP: Josetta Huddle, MD   Brief Narrative:  Julia Floyd is an 83 y.o. female with a history of CVA/ICH s/p TPA with residual deficits, HTN, T2DM who presented from home with increasing discharge from sacral wound and worsening right heel wound. CT abd/pelvis concerning for necrotizing fasciitis. Admitted for IV abx, underwent operative debridement of sacral wound/osteomyelitis 12/17. On linezolid per ID recommendations. [name] demonstrated severe peripheral arterial disease for which vascular surgery has been consulted.  Assessment & Plan: Principal Problem:   Sacral osteomyelitis ([place]) Active Problems:   Stroke (cerebrum) ([place]) -  Suspect stroke/TIA s/p tPA with multifocal ICH - etiology unclear, concerning for underlying CAA   Benign essential HTN   Diabetes mellitus type 2 in nonobese (HCC)   Skin ulcer of sacrum with necrosis of muscle (HCC)   Normochromic normocytic anemia   Open wound of right heel   Aortic atherosclerosis (HCC)   Peripheral arterial disease ([place])   Encounter for palliative care   Goals of care, counseling/discussion  Unstageable sacral pressure ulcer with osteomyelitis: s/p debridement 12/17. - Continuing linezolid per ID through 12/31. Platelets down but not critical. Will continue daily CBC while admitted.  - Continue wound care/hydrotherapy per general surgery. Full wound-healing not anticipated given non-ambulatory status. - CSW consulted for SNF disposition.  1 of 2 blood cultures w/CoNS: Suspect contaminant.  - Monitor  Critical limb ischemia, PAD, complicated by right heel pressure wound:  [name] 0.37 (R) and 0.28 (L). Patient/family continues to desire medical/surgical interventions at this point.  - Discussed with vascular surgery, Dr. Bridgett Larsson, who will evaluate the patient. Not certain she would be revascularization candidate, may require amputation if consistent  with goals of care.  - Wound care per WOC  Normocytic anemia:  - Monitor CBC.  - Continue daily B12 at discharge.  History of T2DM: (last was 5.1%) - Will stop SSI, at inpatient goal consistently without insulin - Holding metformin - Update A1c   Hypertension: Chronic, stable.  - Holding norvasc due to borderline-low BP  History of CVA/ICH s/p TPA: Residual deficits have left her bedbound/wheelchair bound, and cared for by sister who now has significant health problems of her own.  - Suspect disposition to SNF would be safest.   - Pt not taking statin at home.  DVT prophylaxis: SCDs Code Status: Full Family Communication: None at bedside this AM. Disposition Plan: Anticipate DC to SNF.   Consultants:   General surgery  Palliative care  Vascular surgery  Procedures:  Dr. Kae Heller 12/17: Incision and debridement of sacral stage IV decubitus (15x20x3cm including subcutaneous, fascia, muscle and periosteum) and left upper posterior thigh full thickness wound (3x2x2cm, subcutaneous)  Antimicrobials:  Linezolid 12/18 - 12/31   Subjective: Tired, no fevers or other complaints. Pain is controlled. Would want some surgeries if it helped her live longer, but wants to know more details.   Objective: Vitals:   03/25/17 2124 03/26/17 0511 03/26/17 1448 03/26/17 2143  BP: (!) 131/51 (!) 128/50 (!) 93/45 (!) 123/56  Pulse: 93 85 84 85  Resp: 18 18 18 18   Temp: 99.5 F (37.5 C) 98.1 F (36.7 C) 98.6 F (37 C) 99.3 F (37.4 C)  TempSrc: Oral Oral Oral Oral  SpO2: 100% 100% 100% 100%  Weight:      Height:       No intake or output data in the 24 hours ending 03/27/17 1511 Filed Weights   03/16/17  1025 03/17/17 0602 03/21/17 0655  Weight: 76.5 kg (168 lb 10.4 oz) 77.6 kg (171 lb 1.2 oz) 82.9 kg (182 lb 12.2 oz)    Gen: Frail elderly female in no distress Pulm: Non-labored breathing. Clear to auscultation bilaterally.  CV: Regular rate and rhythm. No murmur, rub, or  gallop. No JVD, no pedal edema. GI: Abdomen soft, non-tender, non-distended, with normoactive bowel sounds. No organomegaly or masses felt. Ext: Poorly palpable DP and PT pulses, cap refill delayed at toes. ROM intact.  Skin: Sacral decubiti wounds with dressings in place. Black eschar on right heel  Neuro: Alert and oriented. Flaccid left hemiparesis. Psych: Judgement and insight appear normal. Mood & affect appropriate.   Data Reviewed: I have personally reviewed following labs and imaging studies  CBC: Recent Labs  Lab 03/21/17 0629 03/22/17 0614 03/27/17 0534  WBC 5.9 7.5 6.2  HGB 8.4* 8.5* 7.9*  HCT 26.7* 27.0* 24.9*  MCV 84.5 84.1 83.3  PLT 170 177 852*   Basic Metabolic Panel: Recent Labs  Lab 03/21/17 0629 03/22/17 0614  NA 136 136  K 4.1 4.4  CL 111 111  CO2 22 20*  GLUCOSE 77 79  BUN 10 10  CREATININE 0.79 0.71  CALCIUM 8.3* 8.3*  MG 1.9  --    GFR: Estimated Creatinine Clearance: 56.8 mL/min (by C-G formula based on SCr of 0.71 mg/dL). Liver Function Tests: No results for input(s): AST, ALT, ALKPHOS, BILITOT, PROT, ALBUMIN in the last 168 hours. No results for input(s): LIPASE, AMYLASE in the last 168 hours. No results for input(s): AMMONIA in the last 168 hours. Coagulation Profile: No results for input(s): INR, PROTIME in the last 168 hours. Cardiac Enzymes: No results for input(s): CKTOTAL, CKMB, CKMBINDEX, TROPONINI in the last 168 hours. BNP (last 3 results) No results for input(s): PROBNP in the last 8760 hours. HbA1C: No results for input(s): HGBA1C in the last 72 hours. CBG: Recent Labs  Lab 03/26/17 1208 03/26/17 1648 03/26/17 2141 03/27/17 0803 03/27/17 1137  GLUCAP 91 80 105* 70 96   Lipid Profile: No results for input(s): CHOL, HDL, LDLCALC, TRIG, CHOLHDL, LDLDIRECT in the last 72 hours. Thyroid Function Tests: No results for input(s): TSH, T4TOTAL, FREET4, T3FREE, THYROIDAB in the last 72 hours. Anemia Panel: No results for  input(s): VITAMINB12, FOLATE, FERRITIN, TIBC, IRON, RETICCTPCT in the last 72 hours. Urine analysis:    Component Value Date/Time   COLORURINE YELLOW 03/21/2017 1928   APPEARANCEUR HAZY (A) 03/21/2017 1928   LABSPEC 1.018 03/21/2017 1928   PHURINE 5.0 03/21/2017 1928   GLUCOSEU NEGATIVE 03/21/2017 1928   HGBUR NEGATIVE 03/21/2017 1928   BILIRUBINUR NEGATIVE 03/21/2017 1928   KETONESUR NEGATIVE 03/21/2017 1928   PROTEINUR NEGATIVE 03/21/2017 1928   NITRITE NEGATIVE 03/21/2017 1928   LEUKOCYTESUR SMALL (A) 03/21/2017 1928   Recent Results (from the past 240 hour(s))  Culture, blood (routine x 2)     Status: None   Collection Time: 03/21/17  4:42 PM  Result Value Ref Range Status   Specimen Description BLOOD LEFT WRIST  Final   Special Requests   Final    BOTTLES DRAWN AEROBIC AND ANAEROBIC Blood Culture adequate volume   Culture   Final    NO GROWTH 5 DAYS Performed at [facility], [place] [address] [place], [place] [address]    Report Status 03/27/2017 FINAL  Final  Culture, blood (routine x 2)     Status: None   Collection Time: 03/21/17  4:48 PM  Result Value Ref Range Status   Specimen Description BLOOD LEFT FOREARM  Final   Special Requests IN PEDIATRIC BOTTLE Blood Culture adequate volume  Final   Culture   Final    NO GROWTH 5 DAYS Performed at [facility], [address] [place], [place] [address]    Report Status 03/27/2017 FINAL  Final  Culture, Urine     Status: None   Collection Time: 03/21/17  7:28 PM  Result Value Ref Range Status   Specimen Description URINE, RANDOM  Final   Special Requests NONE  Final   Culture   Final    NO GROWTH Performed at [facility], [place] [address], [place], [place] [address]    Report Status 03/23/2017 FINAL  Final      Radiology Studies: No results found.  Scheduled Meds: . collagenase   Topical TID  . feeding supplement (ENSURE ENLIVE)  237 mL Oral BID BM  . furosemide  20 mg Oral Daily  .  gabapentin  100-200 mg Oral QHS  . insulin aspart  0-9 Units Subcutaneous TID WC  . linezolid  600 mg Oral Q12H  . multivitamin with minerals  1 tablet Oral Daily   Continuous Infusions:   LOS: 14 days   Time spent: 25 minutes.  Vance Gather, MD [facility] Pager [phone]  If 7PM-7AM, please contact night-coverage [email or web address] Password [email or web address] 03/27/2017, 3:11 PM

## 2017-03-27 NOTE — Care Management Important Message (Signed)
Important Message  Patient Details  Name: Julia Floyd MRN: [id number] Date of Birth: April 07, 1930   Medicare Important Message Given:  Yes    Kerin Salen 03/27/2017, 10:58 AMImportant Message  Patient Details  Name: Julia Floyd MRN: [id number] Date of Birth: [birth year]   Medicare Important Message Given:  Yes    Kerin Salen 03/27/2017, 10:57 AM

## 2017-03-27 NOTE — Consult Note (Addendum)
Requested by:  Dr. Vance Gather ([facility])  Reason for consultation: BLE PAD   History of Present Illness   Julia Floyd is a 83 y.o. ([birth year]) female non-ambulatory who presents with chief complaint: BLE PAD.  Pt has limited memory resulting in most of history is reconstructed from the chart.  Patient has not been walking for an unknown time period having had CVA/ICH with neuro deficits.  She is not aware of how long her heels have been ischemic.  She undergone sacral debridement during this admission.  When asked about pain in her feet, she notes only occasionally does she have any pain and this usual resolves quickly.  She thinks she can occasionally feel her feet.  She denies any fever or chills.  Atherosclerotic risk factors include: HTN, DM and prior smoking..  Past Medical History:  Diagnosis Date  . DDD (degenerative disc disease)   . Depression   . Diabetes ([place])   . HTN (hypertension)   . Psychotic disorder ([place])    secondary to the below factors. 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 Bipolar disorder, most likely type 3, this episode being manic  . Sixth cranial nerve palsy   . Stroke ([place])   . Trigeminal neuralgia   . Uterine fibroid     Past Surgical History:  Procedure Laterality Date  . BRONCHOSCOPY    . IRRIGATION AND DEBRIDEMENT ABSCESS N/A 03/16/2017   Procedure: IRRIGATION AND DEBRIDEMENT ABSCESS;  Surgeon: [name], MD;  Location: [facility];  Service: General;  Laterality: N/A;  . TRACHEOSTOMY      Social History   Socioeconomic History  . Marital status: Widowed    Spouse name: Not on file  . Number of children: Not on file  . Years of education: Not on file  . Highest education level: Not on file  Social Needs  . Financial resource strain: Not on file  . Food insecurity - worry: Not on file  . Food insecurity - inability: Not on file  . Transportation needs - medical: Not on file  . Transportation  needs - non-medical: Not on file  Occupational History  . Not on file  Tobacco Use  . Smoking status: Former Research scientist (life sciences)  . Smokeless tobacco: Never Used  Substance and Sexual Activity  . Alcohol use: No  . Drug use: No  . Sexual activity: No  Other Topics Concern  . Not on file  Social History Narrative  . Not on file    Family History  Problem Relation Age of Onset  . Heart attack Unknown   . Diabetes Unknown     Current Facility-Administered Medications  Medication Dose Route Frequency Provider Last Rate Last Dose  . acetaminophen (TYLENOL) tablet 650 mg  650 mg Oral Q6H PRN Rise Patience, MD   650 mg at 03/26/17 2104   Or  . acetaminophen (TYLENOL) suppository 650 mg  650 mg Rectal Q6H PRN Rise Patience, MD      . collagenase (SANTYL) ointment   Topical TID Gardiner Barefoot, NP      . feeding supplement (ENSURE ENLIVE) (ENSURE ENLIVE) liquid 237 mL  237 mL Oral BID BM Bonnielee Haff, MD   237 mL at 03/25/17 1049  . furosemide (LASIX) tablet 20 mg  20 mg Oral Daily Samuella Cota, MD   20 mg at 03/27/17 6440  . gabapentin (NEURONTIN) capsule 100-200 mg  100-200 mg Oral QHS Samuella Cota, MD  100 mg at 03/26/17 2104  . linezolid (ZYVOX) tablet 600 mg  600 mg Oral Q12H Thayer Headings, MD   600 mg at 03/27/17 0905  . multivitamin with minerals tablet 1 tablet  1 tablet Oral Daily Samuella Cota, MD   1 tablet at 03/27/17 9242  . ondansetron (ZOFRAN) tablet 4 mg  4 mg Oral Q6H PRN Rise Patience, MD       Or  . ondansetron [facility]) injection 4 mg  4 mg Intravenous Q6H PRN Rise Patience, MD        No Known Allergies  REVIEW OF SYSTEMS (negative unless checked):   Cardiac:  []  Chest pain or chest pressure? []  Shortness of breath upon activity? []  Shortness of breath when lying flat? []  Irregular heart rhythm?  Vascular:  []  Pain in calf, thigh, or hip brought on by walking? []  Pain in feet at night that wakes you up from your  sleep? []  Blood clot in your veins? []  Leg swelling?  Pulmonary:  []  Oxygen at home? []  Productive cough? []  Wheezing?  Neurologic:  [x]  Sudden weakness in arms or legs? [x]  Sudden numbness in arms or legs? []  Sudden onset of difficult speaking or slurred speech? []  Temporary loss of vision in one eye? []  Problems with dizziness?  Gastrointestinal:  []  Blood in stool? []  Vomited blood?  Genitourinary:  []  Burning when urinating? []  Blood in urine?  Psychiatric:  []  Major depression  Hematologic:  []  Bleeding problems? []  Problems with blood clotting?  Dermatologic:  [x]  Rashes or ulcers?  Constitutional:  []  Fever or chills?  Ear/Nose/Throat:  []  Change in hearing? []  Nose bleeds? []  Sore throat?  Musculoskeletal:  []  Back pain? []  Joint pain? []  Muscle pain?   Physical Examination     Vitals:   03/25/17 2124 03/26/17 0511 03/26/17 1448 03/26/17 2143  BP: (!) 131/51 (!) 128/50 (!) 93/45 (!) 123/56  Pulse: 93 85 84 85  Resp: 18 18 18 18   Temp: 99.5 F (37.5 C) 98.1 F (36.7 C) 98.6 F (37 C) 99.3 F (37.4 C)  TempSrc: Oral Oral Oral Oral  SpO2: 100% 100% 100% 100%  Weight:      Height:       Body mass index is 29.5 kg/m.  General Somulent, Confused, Cachectic, Ill appearing  Head Fults/AT, Temporalis wasting  Ear/Nose/ Throat Hearing grossly intact, nares without erythema or drainage, oropharynx without Erythema or Exudate, Mallampati score: 3,   Eyes PERRL, EOMI,    Neck Supple, mid-line trachea,    Pulmonary Sym exp, good B air movt, CTA B  Cardiac RRR, Nl S1, S2, no Murmurs, No rubs, No S3,S4  Vascular Vessel Right Left  Radial Palpable Palpable  Brachial Palpable Palpable  Carotid Palpable, No Bruit Palpable, No Bruit  Aorta Not palpable N/A  Femoral Palpable Palpable  Popliteal Not palpable Not palpable  PT Not palpable Not palpable  DP Not palpable Faintly palpable    Gastro- intestinal soft, non-distended, non-tender to  palpation, No guarding or rebound, no HSM, no masses, no CVAT B, No palpable prominent aortic pulse,    Musculo- skeletal M/S 4-5/5 BUE, BLE 1-2/5 , B heel dry gangrene (R>L), ischemic patch of skin on R dorsum, both knee somewhat contracted, both legs warm down to distal calves  Neurologic Limited exam due to confusion, decreased sensation in both legs, Motor exam as listed above  Psychiatric Somewhat confuse, limited memory  Dermatologic See M/S exam for extremity exam, No  rashes otherwise noted  Lymphatic  Palpable lymph nodes: None    Non-Invasive Vascular Imaging   ABI (03/18/17)  R:   ABI: 0.37,   PT: mono  DP: mono  L:   ABI: 0.28,   PT: none  DP: mono   Laboratory   CBC CBC Latest Ref Rng & Units 03/27/2017 03/22/2017 03/21/2017  WBC 4.0 - 10.5 K/uL 6.2 7.5 5.9  Hemoglobin 12.0 - 15.0 g/dL 7.9(L) 8.5(L) 8.4(L)  Hematocrit 36.0 - 46.0 % 24.9(L) 27.0(L) 26.7(L)  Platelets 150 - 400 K/uL 113(L) 177 170    BMP BMP Latest Ref Rng & Units 03/22/2017 03/21/2017 03/20/2017  Glucose 65 - 99 mg/dL 79 77 83  BUN 6 - 20 mg/dL 10 10 11   Creatinine 0.44 - 1.00 mg/dL 0.71 0.79 0.80  Sodium 135 - 145 mmol/L 136 136 136  Potassium 3.5 - 5.1 mmol/L 4.4 4.1 3.3(L)  Chloride 101 - 111 mmol/L 111 111 109  CO2 22 - 32 mmol/L 20(L) 22 24  Calcium 8.9 - 10.3 mg/dL 8.3(L) 8.3(L) 8.4(L)    Coagulation Lab Results  Component Value Date   INR 1.03 10/07/2016   INR 1.05 08/18/2016   No results found for: PTT  Lipids    Component Value Date/Time   CHOL 154 10/08/2016 0417   TRIG 263 (H) 10/08/2016 0417   HDL 14 (L) 10/08/2016 0417   CHOLHDL 11.0 10/08/2016 0417   VLDL 53 (H) 10/08/2016 0417   LDLCALC 87 10/08/2016 0417    Radiology     R foot 2 view (03/13/17): 1. Soft tissue ulceration of the heel without radiographic evidence of acute osteomyelitis. 2. Disuse osteopenia status post cast removal. Mild posttraumatic deformity from trimalleolar fracture is  noted.   Medical Decision Making   Julia Floyd is a 83 y.o. female non-ambulatory who presents with: sacral decubitus ulcer, B heel ulcer, B critical BLE PAD, elderly   Pt's decubitus ulcer and bilateral heel ulcers are consistent with chronic bed bound status.  I suspect both feet cannot be salvaged as debridement of each heel is likely to unroof the calcaneus.    Normally this would result in B AKA given she does not ambulate.  Given this patient is relatively asx in regards to her feet, I would recommend a palliative approach of pain control as needed, wound care to both feet, considering of sterilization of the skin in both feet daily, and abx as needed.  The patient is somewhat confused, but in brief minutes of lucidity, she seem to suggest she would NOT want any amputations.  At this point, I have nothing more to offer this patient.  Thank you for allowing [place] to participate in this patient's care.   Adele Barthel, MD, FACS [facility] Office: [phone] Pager: [phone]  03/27/2017, 3:26 PM

## 2017-03-28 LAB — CBC
HEMATOCRIT: 24.5 % — AB (ref 36.0–46.0)
Hemoglobin: 7.7 g/dL — ABNORMAL LOW (ref 12.0–15.0)
MCH: 26.2 pg (ref 26.0–34.0)
MCHC: 31.4 g/dL (ref 30.0–36.0)
MCV: 83.3 fL (ref 78.0–100.0)
Platelets: 115 10*3/uL — ABNORMAL LOW (ref 150–400)
RBC: 2.94 MIL/uL — ABNORMAL LOW (ref 3.87–5.11)
RDW: 16.6 % — AB (ref 11.5–15.5)
WBC: 6.2 10*3/uL (ref 4.0–10.5)

## 2017-03-28 LAB — BASIC METABOLIC PANEL
Anion gap: 5 (ref 5–15)
BUN: 9 mg/dL (ref 6–20)
CALCIUM: 8.3 mg/dL — AB (ref 8.9–10.3)
CHLORIDE: 105 mmol/L (ref 101–111)
CO2: 24 mmol/L (ref 22–32)
CREATININE: 0.72 mg/dL (ref 0.44–1.00)
GFR calc non Af Amer: >60 mL/min (ref >60)
Glucose, Bld: 85 mg/dL (ref 65–99)
Potassium: 4.4 mmol/L (ref 3.5–5.1)
SODIUM: 134 mmol/L — AB (ref 135–145)

## 2017-03-28 NOTE — Progress Notes (Signed)
LCSW following for SNF placement.   Patient and family chose bed at [facility].   LCSW confirmed bed with facility.  [facility] will not have bed available until Monday 12/31.  Carolin Coy [place] [facility] [place]

## 2017-03-28 NOTE — Progress Notes (Signed)
PROGRESS NOTE  Julia Floyd  WIO:[id number] DOB: [birth year] DOA: 03/13/2017 PCP: Josetta Huddle, MD   Brief Narrative:  Julia Floyd is an 83 y.o. female with a history of CVA/ICH s/p TPA with residual deficits, HTN, T2DM who presented from home with increasing discharge from sacral wound and worsening right heel wound. CT abd/pelvis concerning for necrotizing fasciitis. Admitted for IV abx, underwent operative debridement of sacral wound/osteomyelitis 12/17. On linezolid per ID recommendations. [name] demonstrated severe peripheral arterial disease for which vascular surgery has been consulted.  Assessment & Plan: Principal Problem:   Sacral osteomyelitis ([place]) Active Problems:   Stroke (cerebrum) ([place]) -  Suspect stroke/TIA s/p tPA with multifocal ICH - etiology unclear, concerning for underlying CAA   Benign essential HTN   Diabetes mellitus type 2 in nonobese (HCC)   Skin ulcer of sacrum with necrosis of muscle (HCC)   Normochromic normocytic anemia   Open wound of right heel   Aortic atherosclerosis (HCC)   Peripheral arterial disease ([place])   Encounter for palliative care   Goals of care, counseling/discussion  Unstageable sacral pressure ulcer with osteomyelitis: s/p debridement 12/17. - Continuing linezolid per ID through 12/31. Platelets down but not critical. Will continue daily CBC while admitted.  - Continue wound care/hydrotherapy per general surgery. Full wound-healing not anticipated given non-ambulatory status. - CSW consulted for SNF disposition.  Thrombocytopenia: Has had this in the past. No bleeding.  - Continue monitoring CBC daily while on linezolid.  - SCDs for DVT ppx  1 of 2 blood cultures w/CoNS: Suspect contaminant.  - Monitor  Critical limb ischemia, PAD, complicated by right heel pressure wound:  [name] 0.37 (R) and 0.28 (L).  - Consulted vascular surgery, Dr. Bridgett Larsson, who evaluated patient 12/28, felt conservative measures appropriate.  -  Wound care per WOC.  Normocytic anemia:  - Monitor CBC, transfuse if hgb <7 - Check iron panel  - Continue daily B12 at discharge.  History of T2DM: (last was 5.1%).  - At inpatient goal consistently without insulin - Holding metformin  Hypertension: Chronic, stable.  - Holding norvasc due to borderline-low BP  History of CVA/ICH s/p TPA: Residual deficits have left her bedbound/wheelchair bound, and cared for by sister who now has significant health problems of her own.  - Awaiting bed availability 12/31.  - Pt not taking statin at home.  DVT prophylaxis: SCDs Code Status: Full Family Communication: None at bedside this [facility] phone numbers listed on demographics yesterday and today without answer.  Disposition Plan: Medically stable for DC to SNF, bed will be available 12/31.  Consultants:   General surgery  Palliative care  Vascular surgery Procedures:  Dr. Kae Heller 12/17: Incision and debridement of sacral stage IV decubitus (15x20x3cm including subcutaneous, fascia, muscle and periosteum) and left upper posterior thigh full thickness wound (3x2x2cm, subcutaneous) Antimicrobials: Linezolid 12/18 - 12/31   Subjective: Tired but no other complaints. Reports she's in no pain, specifically no pain in extremities or sacrum/bottom.   Objective: BP (!) 122/59 (BP Location: Left Arm)   Pulse 90   Temp 98.4 F (36.9 C) (Oral)   Resp 17   Ht 5\' 6"  (1.676 m)   Wt 80.6 kg (177 lb 11.1 oz)   SpO2 100%   BMI 28.68 kg/m   Gen: Frail elderly female in no distress Pulm: Non-labored breathing. Clear to auscultation bilaterally.  CV: Regular rate and rhythm. No murmur, rub, or gallop. No JVD, no pedal edema. GI: Abdomen soft, non-tender, non-distended, with normoactive bowel  sounds. No organomegaly or masses felt. Ext: Poorly palpable DP and PT pulses, cap refill delayed at toes. ROM intact.  Skin: Sacral decubiti wounds with dressings in place. Black eschar on right heel. Poor  cap refill throughout LE's  Neuro: Alert and oriented. Incomplete left hemiparesis. Psych: Judgement and insight appear normal. Mood & affect appropriate.   CBC: Recent Labs  Lab 03/22/17 0614 03/27/17 0534 03/28/17 0606  WBC 7.5 6.2 6.2  HGB 8.5* 7.9* 7.7*  HCT 27.0* 24.9* 24.5*  MCV 84.1 83.3 83.3  PLT 177 113* 115*     LOS: 15 days   Time spent: 25 minutes.  Vance Gather, MD [facility] Pager [phone]  If 7PM-7AM, please contact night-coverage [email or web address] Password [email or web address] 03/28/2017, 1:09 PM

## 2017-03-29 LAB — CBC
HEMATOCRIT: 25.5 % — AB (ref 36.0–46.0)
Hemoglobin: 7.9 g/dL — ABNORMAL LOW (ref 12.0–15.0)
MCH: 25.8 pg — ABNORMAL LOW (ref 26.0–34.0)
MCHC: 31 g/dL (ref 30.0–36.0)
MCV: 83.3 fL (ref 78.0–100.0)
PLATELETS: 97 10*3/uL — AB (ref 150–400)
RBC: 3.06 MIL/uL — ABNORMAL LOW (ref 3.87–5.11)
RDW: 16.6 % — AB (ref 11.5–15.5)
WBC: 5.8 10*3/uL (ref 4.0–10.5)

## 2017-03-29 LAB — IRON AND TIBC
IRON: 52 ug/dL (ref 28–170)
Saturation Ratios: 35 % — ABNORMAL HIGH (ref 10.4–31.8)
TIBC: 148 ug/dL — AB (ref 250–450)
UIBC: 96 ug/dL

## 2017-03-29 LAB — FERRITIN: FERRITIN: 1477 ng/mL — AB (ref 11–307)

## 2017-03-29 NOTE — Progress Notes (Signed)
PROGRESS NOTE  Julia Floyd  PJA:[id number] DOB: Oct 06, 1930 DOA: 03/13/2017 PCP: Josetta Huddle, MD   Brief Narrative:  Julia Floyd is an 83 y.o. female with a history of CVA/ICH s/p TPA with residual deficits, HTN, T2DM who presented from home with increasing discharge from sacral wound and worsening right heel wound. CT abd/pelvis concerning for necrotizing fasciitis. Admitted for IV abx, underwent operative debridement of sacral wound/osteomyelitis 12/17. On linezolid per ID recommendations. [name] demonstrated severe peripheral arterial disease for which vascular surgery was consulted. They recommended conservative measures. this was discussed with the patient and granddaughter. If remains stable, plan is to discharge to [facility] 12/31.   Assessment & Plan: Principal Problem:   Sacral osteomyelitis ([place]) Active Problems:   Stroke (cerebrum) ([place]) -  Suspect stroke/TIA s/p tPA with multifocal ICH - etiology unclear, concerning for underlying CAA   Benign essential HTN   Diabetes mellitus type 2 in nonobese (HCC)   Skin ulcer of sacrum with necrosis of muscle (HCC)   Normochromic normocytic anemia   Open wound of right heel   Aortic atherosclerosis (HCC)   Peripheral arterial disease ([place])   Encounter for palliative care   Goals of care, counseling/discussion  Unstageable sacral pressure ulcer with osteomyelitis: s/p debridement 12/17. - Continuing linezolid per ID through 12/31. Platelets down but not critical. Check CBC in AM and in the next few days after discharge. WBC remains normal. - Continue wound care/hydrotherapy per general surgery. Full wound-healing not anticipated given non-ambulatory status. - CSW consulted for SNF disposition.  Thrombocytopenia: Has had this in the past. No bleeding.  - Continue monitoring CBC daily while on linezolid.  - SCDs for DVT ppx  1 of 2 blood cultures w/CoNS: Suspect contaminant.  - Monitor  Critical limb ischemia, PAD,  complicated by right heel pressure wound:  [name] 0.37 (R) and 0.28 (L).  - Consulted vascular surgery, Dr. Bridgett Larsson, who evaluated patient 12/28, felt conservative measures appropriate.  - Wound care per WOC.  Normocytic anemia:  - Monitor CBC, transfuse if hgb <7 - Check iron panel, pending - Continue daily B12 at discharge.  History of T2DM: (last was 5.1%).  - At inpatient goal consistently without insulin - Holding metformin  Hypertension: Chronic, stable.  - Holding norvasc due to borderline-low BP  History of CVA/ICH s/p TPA: Residual deficits have left her bedbound/wheelchair bound, and cared for by sister who now has significant health problems of her own.  - Awaiting bed availability 12/31.  - Pt not taking statin at home.  DVT prophylaxis: SCDs Code Status: Full Family Communication: Some extended family at bedside who were able to reach great grandson who got pt's granddaughter on the phone. Discussed plan including limb ischemia in detail, confirmed no interventions to be undertaken at this time.  Disposition Plan: Medically stable for DC to SNF, bed will be available 12/31.  Consultants:   General surgery  Palliative care  Vascular surgery Procedures:  Dr. Kae Heller 12/17: Incision and debridement of sacral stage IV decubitus (15x20x3cm including subcutaneous, fascia, muscle and periosteum) and left upper posterior thigh full thickness wound (3x2x2cm, subcutaneous) Antimicrobials: Linezolid 12/18 - 12/31   Subjective: Watching church on TV. Denies pain or fever.  Objective: BP (!) 137/45 (BP Location: Left Arm)   Pulse 92   Temp (!) 101.1 F (38.4 C) (Oral) Comment: rn notify  Resp 18   Ht 5\' 6"  (1.676 m)   Wt 80.6 kg (177 lb 11.1 oz)   SpO2 100%  BMI 28.68 kg/m   Gen: Frail elderly female in no distress Pulm: Non-labored breathing. Clear to auscultation bilaterally.  CV: Regular rate and rhythm. No murmur, rub, or gallop. No JVD, no pedal edema. GI:  Abdomen soft, non-tender, non-distended, with normoactive bowel sounds. No organomegaly or masses felt. Ext: Poorly palpable DP and PT pulses, cap refill delayed at toes. ROM intact.  Skin: Sacral decubiti wounds with dressings in place. Black eschar on right heel. Poor cap refill throughout LE's as above. No purulence or fluctuance.  Neuro: Alert and oriented. Incomplete left hemiparesis. Psych: Judgement and insight appear impaired by incomplete short term recall. Mood & affect appropriate.   CBC: Recent Labs  Lab 03/27/17 0534 03/28/17 0606 03/29/17 0559  WBC 6.2 6.2 5.8  HGB 7.9* 7.7* 7.9*  HCT 24.9* 24.5* 25.5*  MCV 83.3 83.3 83.3  PLT 113* 115* 97*     LOS: 16 days   Time spent: 25 minutes.  Vance Gather, MD [facility] Pager [phone]  If 7PM-7AM, please contact night-coverage [email or web address] Password [email or web address] 03/29/2017, 2:28 PM

## 2017-03-30 LAB — CBC
HCT: 22.6 % — ABNORMAL LOW (ref 36.0–46.0)
HEMOGLOBIN: 7.4 g/dL — AB (ref 12.0–15.0)
MCH: 27.1 pg (ref 26.0–34.0)
MCHC: 32.7 g/dL (ref 30.0–36.0)
MCV: 82.8 fL (ref 78.0–100.0)
Platelets: 96 10*3/uL — ABNORMAL LOW (ref 150–400)
RBC: 2.73 MIL/uL — AB (ref 3.87–5.11)
RDW: 16.8 % — ABNORMAL HIGH (ref 11.5–15.5)
WBC: 6.3 10*3/uL (ref 4.0–10.5)

## 2017-03-30 MED ORDER — LINEZOLID 600 MG PO TABS: 600.0000 mg | ORAL_TABLET | Freq: Two times a day (BID) | ORAL | 0 refills | Status: DC

## 2017-03-30 MED ORDER — CYCLOBENZAPRINE HCL 5 MG PO TABS: 5.0000 mg | ORAL_TABLET | Freq: Three times a day (TID) | ORAL | 0 refills | Status: AC | PRN

## 2017-03-30 NOTE — Discharge Summary (Signed)
Physician Discharge Summary  Julia Floyd:[id number] DOB: [birth year] DOA: 03/13/2017  PCP: Josetta Huddle, MD  Admit date: 03/13/2017 Discharge date: 03/30/2017  Admitted From: Home Disposition: SNF   Recommendations for Outpatient Follow-up:  1. Follow up with PCP in 1-2 weeks 2. Please obtain CBC this week after completing last dose of zyvox 12/31 PM.  3. Monitor blood glucose (holding metformin) 4. Monitor blood pressure (holding norvasc)  Home Health: N/A Equipment/Devices: Per SNF Discharge Condition: Stable CODE STATUS: Full Diet recommendation: Heart healthy, carb-modified  Brief/Interim Summary: Julia Floyd is an 83 y.o. female with a history of CVA/ICH s/p TPA with residual deficits, HTN, T2DM who presented from home with increasing discharge from sacral wound and worsening right heel wound. CT abd/pelvis concerning for necrotizing fasciitis. Admitted for IV abx, underwent operative debridement of sacral wound/osteomyelitis 12/17. On linezolid per ID recommendations. [name] demonstrated severe peripheral arterial disease for which vascular surgery was consulted. They recommended conservative measures. this was discussed with the patient and granddaughter. If remains stable, plan is to discharge to [facility] 12/31.   Discharge Diagnoses:  Principal Problem:   Sacral osteomyelitis ([place]) Active Problems:   Stroke (cerebrum) ([place]) -  Suspect stroke/TIA s/p tPA with multifocal ICH - etiology unclear, concerning for underlying CAA   Benign essential HTN   Diabetes mellitus type 2 in nonobese (HCC)   Skin ulcer of sacrum with necrosis of muscle (HCC)   Normochromic normocytic anemia   Open wound of right heel   Aortic atherosclerosis (HCC)   Peripheral arterial disease ([place])   Encounter for palliative care   Goals of care, counseling/discussion  Unstageable sacral pressure ulcer with osteomyelitis: s/p debridement 12/17. - Continuing linezolid per ID through  12/31. Platelets down but not critical. Check CBC in the next few days after discharge. Had a single isolated fever 12/30 otherwise flat fever curve and WBC remains normal. Pt felt well during that time. - Continue wound care w/ BID wet-to-dry changes, would recommend wound care follow up. Full wound-healing not anticipated given non-ambulatory status. - CSW consulted for SNF disposition.  Thrombocytopenia: Has had this in the past. No bleeding. Possibly worsened by zyvox.  - Monitor CBC.   1 of 2 blood cultures w/CoNS: Suspect contaminant.  - Monitor  Critical limb ischemia, PAD, complicated by right heel pressure wound:  [name] 0.37 (R) and 0.28 (L).  - Consulted vascular surgery, Dr. Bridgett Larsson, who evaluated patient 12/28, felt conservative measures appropriate.  - Wound care per WOC.  Normocytic anemia of chronic disease: Ferritin 1,477.   - Monitor CBC, transfuse if hgb <7 - Continue daily B12 at discharge.  Muscle spasms: Intermittent - Continue prn flexeril  History of T2DM: (last was 5.1%).  - At inpatient goal consistently without insulin - Will hold metformin for now.   Hypertension: Chronic, stable.  - Holding norvasc due to borderline-low BP  History of CVA/ICH s/p TPA: Residual deficits have left her bedbound/wheelchair bound, and cared for by sister who now has significant health problems of her own.  - Pt not taking statin at home.  Discharge Instructions Discharge Instructions    Change dressing (specify)   Complete by:  As directed    Wet to dry dressing changes BID   Diet - low sodium heart healthy   Complete by:  As directed    Diet Carb Modified   Complete by:  As directed      Allergies as of 03/30/2017   No Known Allergies  Medication List    STOP taking these medications   amLODipine 10 MG tablet Commonly known as:  NORVASC   metFORMIN 500 MG tablet Commonly known as:  GLUCOPHAGE   oxyCODONE-acetaminophen 5-325 MG tablet Commonly  known as:  PERCOCET   pravastatin 40 MG tablet Commonly known as:  PRAVACHOL     TAKE these medications   cyanocobalamin 500 MCG tablet Take 1 tablet (500 mcg total) by mouth daily. Vitamin B12   cyclobenzaprine 5 MG tablet Commonly known as:  FLEXERIL Take 1 tablet (5 mg total) by mouth 3 (three) times daily as needed for muscle spasms.   FISH OIL PO Take 1 capsule daily by mouth.   furosemide 20 MG tablet Commonly known as:  LASIX Take 1 tablet (20 mg total) by mouth daily.   gabapentin 100 MG capsule Commonly known as:  NEURONTIN Take 1-2 capsules (100-200 mg total) by mouth at bedtime.   linezolid 600 MG tablet Commonly known as:  ZYVOX Take 1 tablet (600 mg total) by mouth every 12 (twelve) hours.   VITAMIN D (CHOLECALCIFEROL) PO Take 1 capsule daily by mouth.            Discharge Care Instructions  (From admission, onward)        Start     Ordered   03/30/17 0000  Change dressing (specify)    Comments:  Wet to dry dressing changes BID   03/30/17 1230      Contact information for follow-up providers    Josetta Huddle, MD Follow up.   Specialty:  Internal Medicine Contact information: [address] [place] [address] [phone]            Contact information for after-discharge care    Destination    [facility] Follow up.   Service:  Skilled Nursing Contact information: [place] [phone]                 No Known Allergies  Consultants:   General surgery  Palliative care  Vascular surgery Procedures:  Dr. Kae Heller 12/17: Incision and debridement of sacral stage IV decubitus (15x20x3cm including subcutaneous, fascia, muscle and periosteum) and left upper posterior thigh full thickness wound (3x2x2cm, subcutaneous)  Ct Abdomen Pelvis Wo Contrast  Result Date: 03/13/2017 CLINICAL DATA:  Acute onset of right leg pain. Evaluate known sacral ulceration. EXAM:  CT ABDOMEN AND PELVIS WITHOUT CONTRAST TECHNIQUE: Multidetector CT imaging of the abdomen and pelvis was performed following the standard protocol without IV contrast. COMPARISON:  MRI of the lumbar spine performed 08/09/2012 FINDINGS: Lower chest: There is elevation of the left hemidiaphragm. Scattered coronary artery calcifications are seen. The visualized right lung base is clear. Hepatobiliary: The liver is unremarkable in appearance. Minimal sludge is suggested within the gallbladder. The common bile duct remains normal in caliber. Pancreas: The pancreas is within normal limits. Spleen: The spleen is unremarkable in appearance. Adrenals/Urinary Tract: The adrenal glands are unremarkable in appearance. Mild right-sided hydronephrosis is noted; this may reflect mild mass-effect from the patient's uterine fibroids. No renal or ureteral stones are identified. No significant perinephric stranding is seen. Stomach/Bowel: The stomach is unremarkable in appearance. The small bowel is within normal limits. The appendix is not visualized; there is no evidence for appendicitis. The colon is unremarkable in appearance. Vascular/Lymphatic: Scattered calcification is seen along the abdominal aorta and its branches. The abdominal aorta is otherwise grossly unremarkable. The inferior vena cava is grossly unremarkable. No  retroperitoneal lymphadenopathy is seen. No pelvic sidewall lymphadenopathy is identified. Reproductive: The bladder is significantly distended and grossly unremarkable in appearance. Numerous prominent uterine fibroids are noted, many of which demonstrate calcification. No suspicious adnexal masses are seen. Other: A prominent sacral decubitus ulceration is noted on the left side, extending through the medial aspect of the left gluteal musculature. It measures approximately 9 x 8 cm in size. The pattern of air within the ulceration is somewhat unusual, with largely intact overlying skin. Would correlate  clinically to exclude necrotizing fasciitis. There is suggestion of mild erosion at the left side of the sacrum, reflecting osteomyelitis. Stool extends across the anorectal canal, which appears slightly deviated to the right. Musculoskeletal: No acute osseous abnormalities are identified. Mild disc space narrowing is noted at the lower lumbar spine, with endplate sclerotic change and vacuum phenomenon at L4-L5. The visualized musculature is unremarkable in appearance. IMPRESSION: 1. Prominent sacral decubitus ulceration on the left side, extending through the medial aspect of the left gluteal musculature. It measures approximately 9 x 8 cm in size. The pattern of air within the ulceration is somewhat unusual, with largely intact overlying skin. Would correlate clinically to exclude necrotizing fasciitis. 2. Suggestion of mild erosion at the left-sided sacrum, concerning for underlying osteomyelitis. 3. Mild right-sided hydronephrosis may reflect mild mass-effect from the patient's uterine fibroids. 4. Numerous prominent uterine fibroids, many of which are calcified. 5. Scattered coronary artery calcifications seen. Aortic Atherosclerosis (ICD10-I70.0). These results were called by telephone at the time of interpretation on 03/13/2017 at 11:06 pm to Dr. Addison Lank, who verbally acknowledged these results. Electronically Signed   By: Garald Balding M.D.   On: 03/13/2017 23:08   Dg Chest Port 1 View  Result Date: 03/21/2017 CLINICAL DATA:  Fever. EXAM: PORTABLE CHEST 1 VIEW COMPARISON:  01/14/2017 and 08/18/2016 FINDINGS: Patient slightly rotated to the right. Lungs are adequately inflated without focal airspace consolidation or effusion. Cardiomediastinal silhouette and remainder of the exam is unchanged. IMPRESSION: No active disease. Electronically Signed   By: Marin Olp M.D.   On: 03/21/2017 17:41   Dg Foot 2 Views Right  Result Date: 03/13/2017 CLINICAL DATA:  Foot wound along the heel. EXAM:  RIGHT FOOT - 2 VIEW COMPARISON:  01/14/2017 FINDINGS: Removal of overlying fiberglass cast since prior. Disuse osteopenia is noted with mild posttraumatic change in malleoli. No new fracture is identified. There is osteoarthritic joint space narrowing of the toes and midfoot articulations. Soft tissue ulceration is noted of the heel without underlying cortical bony destruction of the calcaneus. Small calcaneal enthesophytes are present along the plantar dorsal aspect. IMPRESSION: 1. Soft tissue ulceration of the heel without radiographic evidence of acute osteomyelitis. 2. Disuse osteopenia status post cast removal. Mild posttraumatic deformity from trimalleolar fracture is noted. Electronically Signed   By: Ashley Royalty M.D.   On: 03/13/2017 21:09    Subjective: No pain, denies fevers, chills, night sweats. No bleeding, chest pain, dyspnea.   Discharge Exam: Vitals:   03/29/17 2055 03/30/17 0600  BP: (!) 114/44 (!) 114/50  Pulse: 92 92  Resp: 16 18  Temp: 98.7 F (37.1 C) 99.4 F (37.4 C)  SpO2: 100% 100%   General: Pt is alert, awake, not in acute distress Cardiovascular: RRR, S1/S2 +, no rubs, no gallops Respiratory: CTA bilaterally, no wheezing, no rhonchi Abdominal: Soft, NT, ND, bowel sounds + Ext: Poorly palpable DP and PT pulses, cap refill delayed at toes. ROM intact.  Skin: Sacral decubiti wounds with dressings  in place. Black eschar on right heel. Poor cap refill throughout LE's as above. No purulence or fluctuance.  Neuro: Alert and oriented. Incomplete left hemiparesis. Psych: Judgement and insight appear impaired by incomplete short term recall. Mood & affect appropriate.    Labs: BNP (last 3 results) No results for input(s): BNP in the last 8760 hours. Basic Metabolic Panel: Recent Labs  Lab 03/28/17 0606  NA 134*  K 4.4  CL 105  CO2 24  GLUCOSE 85  BUN 9  CREATININE 0.72  CALCIUM 8.3*   Liver Function Tests: No results for input(s): AST, ALT, ALKPHOS,  BILITOT, PROT, ALBUMIN in the last 168 hours. No results for input(s): LIPASE, AMYLASE in the last 168 hours. No results for input(s): AMMONIA in the last 168 hours. CBC: Recent Labs  Lab 03/27/17 0534 03/28/17 0606 03/29/17 0559 03/30/17 0607  WBC 6.2 6.2 5.8 6.3  HGB 7.9* 7.7* 7.9* 7.4*  HCT 24.9* 24.5* 25.5* 22.6*  MCV 83.3 83.3 83.3 82.8  PLT 113* 115* 97* 96*   Cardiac Enzymes: No results for input(s): CKTOTAL, CKMB, CKMBINDEX, TROPONINI in the last 168 hours. BNP: Invalid input(s): POCBNP CBG: Recent Labs  Lab 03/26/17 1648 03/26/17 2141 03/27/17 0803 03/27/17 1137 03/27/17 1610  GLUCAP 80 105* 70 96 101*   D-Dimer No results for input(s): DDIMER in the last 72 hours. Hgb A1c No results for input(s): HGBA1C in the last 72 hours. Lipid Profile No results for input(s): CHOL, HDL, LDLCALC, TRIG, CHOLHDL, LDLDIRECT in the last 72 hours. Thyroid function studies No results for input(s): TSH, T4TOTAL, T3FREE, THYROIDAB in the last 72 hours.  Invalid input(s): FREET3 Anemia work up Recent Labs    03/29/17 0559  FERRITIN 1,477*  TIBC 148*  IRON 52   Urinalysis    Component Value Date/Time   COLORURINE YELLOW 03/21/2017 1928   APPEARANCEUR HAZY (A) 03/21/2017 1928   LABSPEC 1.018 03/21/2017 1928   PHURINE 5.0 03/21/2017 1928   GLUCOSEU NEGATIVE 03/21/2017 1928   HGBUR NEGATIVE 03/21/2017 1928   BILIRUBINUR NEGATIVE 03/21/2017 1928   KETONESUR NEGATIVE 03/21/2017 1928   PROTEINUR NEGATIVE 03/21/2017 1928   NITRITE NEGATIVE 03/21/2017 1928   LEUKOCYTESUR SMALL (A) 03/21/2017 1928    Microbiology Recent Results (from the past 240 hour(s))  Culture, blood (routine x 2)     Status: None   Collection Time: 03/21/17  4:42 PM  Result Value Ref Range Status   Specimen Description BLOOD LEFT WRIST  Final   Special Requests   Final    BOTTLES DRAWN AEROBIC AND ANAEROBIC Blood Culture adequate volume   Culture   Final    NO GROWTH 5 DAYS Performed at [facility], [address], [place], [place] [address]    Report Status 03/27/2017 FINAL  Final  Culture, blood (routine x 2)     Status: None   Collection Time: 03/21/17  4:48 PM  Result Value Ref Range Status   Specimen Description BLOOD LEFT FOREARM  Final   Special Requests IN PEDIATRIC BOTTLE Blood Culture adequate volume  Final   Culture   Final    NO GROWTH 5 DAYS Performed at [facility], [place] [address], [place],  [address]    Report Status 03/27/2017 FINAL  Final  Culture, Urine     Status: None   Collection Time: 03/21/17  7:28 PM  Result Value Ref Range Status   Specimen Description URINE, RANDOM  Final   Special Requests NONE  Final  Culture   Final    NO GROWTH Performed at [facility], [place] [address], [place], [place] [address]    Report Status 03/23/2017 FINAL  Final    Time coordinating discharge: Approximately 40 minutes  Vance Gather, MD  [facility] 03/30/2017, 12:33 PM Pager [phone]

## 2017-03-30 NOTE — Clinical Social Work Placement (Addendum)
[facility] called for transport.  Nurse given number for report.   CLINICAL SOCIAL WORK PLACEMENT  NOTE  Date:  03/30/2017  Patient Details  Name: Julia Floyd MRN: [id number] Date of Birth: 03/05/1931  Clinical Social Work is seeking post-discharge placement for this patient at the [place] level of care (*CSW will initial, date and re-position this form in  chart as items are completed):  Yes   Patient/family provided with [facility] Work Department's list of facilities offering this level of care within the geographic area requested by the patient (or if unable, by the patient's family).  Yes   Patient/family informed of their freedom to choose among providers that offer the needed level of care, that participate in Medicare, Medicaid or managed care program needed by the patient, have an available bed and are willing to accept the patient.  Yes   Patient/family informed of [facility]'s ownership interest in [facility] and [facility], as well as of the fact that they are under no obligation to receive care at these facilities.  PASRR submitted to EDS on       PASRR number received on       Existing PASRR number confirmed on 03/30/17([phone] [id number])     FL2 transmitted to all facilities in geographic area requested by pt/family on       FL2 transmitted to all facilities within larger geographic area on       Patient informed that his/her managed care company has contracts with or will negotiate with certain facilities, including the following:  [employer]     Yes   Patient/family informed of bed offers received.  Patient chooses bed at [facility]     Physician recommends and patient chooses bed at      Patient to be transferred to [facility] on 03/30/17.  Patient to be transferred to facility by PTAR      Patient family notified on 03/30/17 of transfer.  Name of family member notified:  Daughter at bedside,      PHYSICIAN Please prepare priority discharge summary, including medications     Additional Comment:    _______________________________________________ Lia Hopping, LCSW 03/30/2017, 12:09 PM

## 2017-03-30 NOTE — NC FL2 (Signed)
[place] LEVEL OF CARE SCREENING TOOL     IDENTIFICATION  Patient Name: Julia Floyd Birthdate: 05/04/1930 Sex: female Admission Date (Current Location): 03/13/2017  [facility] and [place] Number:  Herbalist and Address:  [facility],  [place], [place]      Provider Number: [id number]  Attending Physician Name and Address:  Patrecia Pour, MD  Relative Name and Phone Number:       Current Level of Care: Hospital Recommended Level of Care: [place] Prior Approval Number:    Date Approved/Denied:   PASRR Number:   [id number])       Discharge Plan: SNF    Current Diagnoses: Patient Active Problem List   Diagnosis Date Noted  . Encounter for palliative care   . Goals of care, counseling/discussion   . Peripheral arterial disease ([place]) 03/18/2017  . Sacral osteomyelitis ([place]) 03/17/2017  . Open wound of right heel 03/17/2017  . Aortic atherosclerosis ([place]) 03/17/2017  . Skin ulcer of sacrum with necrosis of muscle ([place]) 03/14/2017  . Normochromic normocytic anemia 03/14/2017  . Closed trimalleolar fracture, right, initial encounter 02/05/2017  . Closed dislocation of right ankle 01/14/2017  . Dysphagia 01/14/2017  . History of CVA (cerebrovascular accident) 01/14/2017  . Acute urinary retention 01/14/2017  . Alzheimer's dementia 01/14/2017  . Benign essential HTN   . Neuropathic pain   . Urinary incontinence   . Constipation   . Diabetes mellitus type 2 in nonobese (HCC)   . Acute blood loss anemia   . Gait disturbance, post-stroke   . Intraparenchymal hemorrhage of brain ([place]) 10/14/2016  . Received tissue plasminogen activator (t-PA) less than 24 hours prior to arrival 10/09/2016  . ICH (intracerebral hemorrhage) ([place]) 10/09/2016  . Cytotoxic brain edema ([place]) 10/09/2016  . Stroke (cerebrum) ([place]) -  Suspect stroke/TIA s/p tPA with multifocal ICH - etiology unclear, concerning for  underlying CAA 10/07/2016  . Diabetes ([place])   . Depression   . DDD (degenerative disc disease)   . Uterine fibroid   . Trigeminal neuralgia   . Sixth cranial nerve palsy     Orientation RESPIRATION BLADDER Height & Weight     Self, Time, Place, Situation  Normal External catheter Weight: 173 lb 4.5 oz (78.6 kg) Height:  5\' 6"  (167.6 cm)  BEHAVIORAL SYMPTOMS/MOOD NEUROLOGICAL BOWEL NUTRITION STATUS      Incontinent Diet(See dc summary)  AMBULATORY STATUS COMMUNICATION OF NEEDS Skin   Total Care Verbally PU Stage and Appropriate Care                       Personal Care Assistance Level of Assistance  Total care   Feeding assistance: Limited assistance       Functional Limitations Info  Sight, Hearing, Speech Sight Info: Adequate Hearing Info: Adequate Speech Info: Adequate    SPECIAL CARE FACTORS FREQUENCY  PT (By licensed PT)     PT Frequency: 6x/week(Hydrotherapy)              Contractures      Additional Factors Info  Code Status, Allergies Code Status Info: Full Allergies Info: NKA           Current Medications (03/30/2017):  This is the current hospital active medication list Current Facility-Administered Medications  Medication Dose Route Frequency Provider Last Rate Last Dose  . acetaminophen (TYLENOL) tablet 650 mg  650 mg Oral Q6H PRN Rise Patience, MD  650 mg at 03/29/17 1743   Or  . acetaminophen (TYLENOL) suppository 650 mg  650 mg Rectal Q6H PRN Rise Patience, MD      . collagenase (SANTYL) ointment   Topical TID Gardiner Barefoot, NP      . feeding supplement (ENSURE ENLIVE) (ENSURE ENLIVE) liquid 237 mL  237 mL Oral BID BM Bonnielee Haff, MD   237 mL at 03/30/17 0934  . furosemide (LASIX) tablet 20 mg  20 mg Oral Daily Samuella Cota, MD   20 mg at 03/30/17 0934  . gabapentin (NEURONTIN) capsule 100-200 mg  100-200 mg Oral QHS Samuella Cota, MD   100 mg at 03/29/17 2101  . linezolid (ZYVOX) tablet 600 mg   600 mg Oral Q12H Patrecia Pour, MD   600 mg at 03/30/17 0933  . multivitamin with minerals tablet 1 tablet  1 tablet Oral Daily Samuella Cota, MD   1 tablet at 03/30/17 1117  . ondansetron (ZOFRAN) tablet 4 mg  4 mg Oral Q6H PRN Rise Patience, MD       Or  . ondansetron [facility]) injection 4 mg  4 mg Intravenous Q6H PRN Rise Patience, MD         Discharge Medications: Please see discharge summary for a list of discharge medications.  Relevant Imaging Results:  Relevant Lab Results:   Additional Information SSN- [id number]  Lia Hopping, LCSW

## 2017-03-30 NOTE — Progress Notes (Addendum)
IV removed. Dressing changed before DC. Papers given to EMS. Attempted # given by Education officer, museum twice as well as number from internet for [facility]. Both numbers did not go through to give report.

## 2017-03-30 NOTE — Consult Note (Addendum)
[facility] Nurse wound follow up Wound type:Pressure injuries to left IT and sacrum (POA) Measurement:Per PT note on Friday Wound bed: Left IT is 75% red/25% necrotic Sacrum is 50% red/50% necrotic Drainage (amount, consistency, odor) Small to moderate amount of serous exudate, mild odor consistent with necrotic tissue Periwound: macerated, intact in the immediate periwound area Dressing procedure/placement/frequency: Per [facility] notes on Friday, PT hydrotherapy treatments are discontinued as patient is believed to have achieved maximum benefit from those treatments.  Wound to the Left IT and sacrum are with reduced necrotic tissue but remain non-healing. Patient continues on mattress replacement with low air loss therapy; suggest this therapy continue in SNF. Per LCSW note, patient is likely to transfer to skilled facility today; no Discharge Summary noted and no note yet today from SW.  Discussed plan for discharge today with Bedside RN, C. Young and she will follow up with medical staff. [place] nursing team will not follow, but not routinely, and will remain available to this patient, the nursing and medical teams.  Please call if needed in between visits. Thanks, Maudie Flakes, MSN, RN, Between, Arther Abbott  Pager# [phone]

## 2017-03-30 NOTE — Progress Notes (Signed)
[id number]/Rhonda Davis,BSN,RN3,CCM/[phone]/Chart reviewed for cm needs. 

## 2017-03-30 NOTE — Progress Notes (Signed)
14 Days Post-Op   Subjective/Chief Complaint: No complaints   Objective: Vital signs in last 24 hours: Temp:  [98.7 F (37.1 C)-101.1 F (38.4 C)] 99.4 F (37.4 C) (12/31 0600) Pulse Rate:  [92] 92 (12/31 0600) Resp:  [16-18] 18 (12/31 0600) BP: (114-137)/(44-50) 114/50 (12/31 0600) SpO2:  [100 %] 100 % (12/31 0600) Weight:  [78.6 kg (173 lb 4.5 oz)] 78.6 kg (173 lb 4.5 oz) (12/31 0600) Last BM Date: 03/28/17  Intake/Output from previous day: 12/30 0701 - 12/31 0700 In: 700 [P.O.:700] Out: 150 [Urine:150] Intake/Output this shift: No intake/output data recorded.  General appearance: alert and cooperative Resp: clear to auscultation bilaterally Cardio: regular rate and rhythm GI: soft, nontender  Lab Results:  Recent Labs    03/29/17 0559 03/30/17 0607  WBC 5.8 6.3  HGB 7.9* 7.4*  HCT 25.5* 22.6*  PLT 97* 96*   BMET Recent Labs    03/28/17 0606  NA 134*  K 4.4  CL 105  CO2 24  GLUCOSE 85  BUN 9  CREATININE 0.72  CALCIUM 8.3*   PT/INR No results for input(s): LABPROT, INR in the last 72 hours. ABG No results for input(s): PHART, HCO3 in the last 72 hours.  Invalid input(s): PCO2, PO2  Studies/Results: No results found.  Anti-infectives: Anti-infectives (From admission, onward)   Start     Dose/Rate Route Frequency Ordered Stop   03/17/17 1515  linezolid (ZYVOX) tablet 600 mg     600 mg Oral Every 12 hours 03/17/17 1509 03/31/17 0959   03/16/17 1421  piperacillin-tazobactam (ZOSYN) 3.375 GM/50ML IVPB    Comments:  Waldron Session   : cabinet override      03/16/17 1421 03/16/17 1549   03/15/17 0400  vancomycin (VANCOCIN) IVPB 750 mg/150 ml premix  Status:  Discontinued     750 mg 150 mL/hr over 60 Minutes Intravenous Every 24 hours 03/14/17 0111 03/17/17 1509   03/14/17 0800  clindamycin (CLEOCIN) IVPB 900 mg  Status:  Discontinued     900 mg 100 mL/hr over 30 Minutes Intravenous Every 8 hours 03/14/17 0216 03/14/17 1407   03/14/17 0600   piperacillin-tazobactam (ZOSYN) IVPB 3.375 g  Status:  Discontinued     3.375 g 12.5 mL/hr over 240 Minutes Intravenous Every 8 hours 03/14/17 0247 03/17/17 1509   03/13/17 2345  vancomycin (VANCOCIN) 1,500 mg in sodium chloride 0.9 % 500 mL IVPB     1,500 mg 250 mL/hr over 120 Minutes Intravenous  Once 03/13/17 2332 03/14/17 0305   03/13/17 2330  clindamycin (CLEOCIN) IVPB 600 mg     600 mg 100 mL/hr over 30 Minutes Intravenous  Once 03/13/17 2327 03/14/17 0045   03/13/17 2330  piperacillin-tazobactam (ZOSYN) IVPB 3.375 g     3.375 g 100 mL/hr over 30 Minutes Intravenous  Once 03/13/17 2327 03/14/17 0045      Assessment/Plan: s/p Procedure(s): IRRIGATION AND DEBRIDEMENT ABSCESS (N/A) Advance diet  Continue wound care for sacral decub. Will check at next dressing change No new recs  LOS: 17 days    TOTH III,PAUL S 03/30/2017

## 2017-04-08 ENCOUNTER — Emergency Department (HOSPITAL_COMMUNITY): Payer: Medicare Other

## 2017-04-08 ENCOUNTER — Inpatient Hospital Stay (HOSPITAL_COMMUNITY)
Admission: EM | Admit: 2017-04-08 | Discharge: 2017-04-22 | DRG: 871 | Disposition: A | Discharge status: Skilled Nursing Facility | Payer: Medicare Other | Attending: Family Medicine | Admitting: Family Medicine

## 2017-04-08 ENCOUNTER — Encounter (HOSPITAL_COMMUNITY): Payer: Self-pay | Admitting: Emergency Medicine

## 2017-04-08 DIAGNOSIS — E1169 Type 2 diabetes mellitus with other specified complication: Secondary | ICD-10-CM | POA: Diagnosis present

## 2017-04-08 DIAGNOSIS — H4920 Sixth [abducent] nerve palsy, unspecified eye: Secondary | ICD-10-CM | POA: Diagnosis present

## 2017-04-08 DIAGNOSIS — L97429 Non-pressure chronic ulcer of left heel and midfoot with unspecified severity: Secondary | ICD-10-CM | POA: Diagnosis not present

## 2017-04-08 DIAGNOSIS — R6520 Severe sepsis without septic shock: Secondary | ICD-10-CM | POA: Diagnosis present

## 2017-04-08 DIAGNOSIS — I959 Hypotension, unspecified: Secondary | ICD-10-CM

## 2017-04-08 DIAGNOSIS — L89159 Pressure ulcer of sacral region, unspecified stage: Secondary | ICD-10-CM | POA: Diagnosis present

## 2017-04-08 DIAGNOSIS — E11621 Type 2 diabetes mellitus with foot ulcer: Secondary | ICD-10-CM | POA: Diagnosis not present

## 2017-04-08 DIAGNOSIS — I69398 Other sequelae of cerebral infarction: Secondary | ICD-10-CM

## 2017-04-08 DIAGNOSIS — D259 Leiomyoma of uterus, unspecified: Secondary | ICD-10-CM | POA: Diagnosis present

## 2017-04-08 DIAGNOSIS — I7090 Unspecified atherosclerosis: Secondary | ICD-10-CM | POA: Diagnosis not present

## 2017-04-08 DIAGNOSIS — R1310 Dysphagia, unspecified: Secondary | ICD-10-CM | POA: Diagnosis present

## 2017-04-08 DIAGNOSIS — I69391 Dysphagia following cerebral infarction: Secondary | ICD-10-CM

## 2017-04-08 DIAGNOSIS — M4628 Osteomyelitis of vertebra, sacral and sacrococcygeal region: Secondary | ICD-10-CM | POA: Diagnosis present

## 2017-04-08 DIAGNOSIS — L89154 Pressure ulcer of sacral region, stage 4: Secondary | ICD-10-CM | POA: Diagnosis present

## 2017-04-08 DIAGNOSIS — E1151 Type 2 diabetes mellitus with diabetic peripheral angiopathy without gangrene: Secondary | ICD-10-CM | POA: Diagnosis present

## 2017-04-08 DIAGNOSIS — T368X5A Adverse effect of other systemic antibiotics, initial encounter: Secondary | ICD-10-CM | POA: Diagnosis present

## 2017-04-08 DIAGNOSIS — R54 Age-related physical debility: Secondary | ICD-10-CM | POA: Diagnosis present

## 2017-04-08 DIAGNOSIS — N179 Acute kidney failure, unspecified: Secondary | ICD-10-CM | POA: Diagnosis not present

## 2017-04-08 DIAGNOSIS — F319 Bipolar disorder, unspecified: Secondary | ICD-10-CM | POA: Diagnosis present

## 2017-04-08 DIAGNOSIS — E872 Acidosis: Secondary | ICD-10-CM | POA: Diagnosis not present

## 2017-04-08 DIAGNOSIS — I10 Essential (primary) hypertension: Secondary | ICD-10-CM | POA: Diagnosis present

## 2017-04-08 DIAGNOSIS — G500 Trigeminal neuralgia: Secondary | ICD-10-CM | POA: Diagnosis present

## 2017-04-08 DIAGNOSIS — R509 Fever, unspecified: Secondary | ICD-10-CM | POA: Diagnosis not present

## 2017-04-08 DIAGNOSIS — F0280 Dementia in other diseases classified elsewhere without behavioral disturbance: Secondary | ICD-10-CM | POA: Diagnosis present

## 2017-04-08 DIAGNOSIS — Z7401 Bed confinement status: Secondary | ICD-10-CM

## 2017-04-08 DIAGNOSIS — F0390 Unspecified dementia without behavioral disturbance: Secondary | ICD-10-CM | POA: Diagnosis not present

## 2017-04-08 DIAGNOSIS — E46 Unspecified protein-calorie malnutrition: Secondary | ICD-10-CM | POA: Diagnosis not present

## 2017-04-08 DIAGNOSIS — E440 Moderate protein-calorie malnutrition: Secondary | ICD-10-CM | POA: Diagnosis not present

## 2017-04-08 DIAGNOSIS — E43 Unspecified severe protein-calorie malnutrition: Secondary | ICD-10-CM | POA: Diagnosis not present

## 2017-04-08 DIAGNOSIS — R5081 Fever presenting with conditions classified elsewhere: Secondary | ICD-10-CM | POA: Diagnosis not present

## 2017-04-08 DIAGNOSIS — L98423 Non-pressure chronic ulcer of back with necrosis of muscle: Secondary | ICD-10-CM | POA: Diagnosis not present

## 2017-04-08 DIAGNOSIS — L89610 Pressure ulcer of right heel, unstageable: Secondary | ICD-10-CM | POA: Diagnosis present

## 2017-04-08 DIAGNOSIS — D638 Anemia in other chronic diseases classified elsewhere: Secondary | ICD-10-CM | POA: Diagnosis present

## 2017-04-08 DIAGNOSIS — R269 Unspecified abnormalities of gait and mobility: Secondary | ICD-10-CM | POA: Diagnosis not present

## 2017-04-08 DIAGNOSIS — L89620 Pressure ulcer of left heel, unstageable: Secondary | ICD-10-CM | POA: Diagnosis present

## 2017-04-08 DIAGNOSIS — A419 Sepsis, unspecified organism: Principal | ICD-10-CM | POA: Diagnosis present

## 2017-04-08 DIAGNOSIS — R0602 Shortness of breath: Secondary | ICD-10-CM

## 2017-04-08 DIAGNOSIS — Z6827 Body mass index (BMI) 27.0-27.9, adult: Secondary | ICD-10-CM

## 2017-04-08 DIAGNOSIS — D696 Thrombocytopenia, unspecified: Secondary | ICD-10-CM | POA: Diagnosis present

## 2017-04-08 DIAGNOSIS — E876 Hypokalemia: Secondary | ICD-10-CM | POA: Diagnosis not present

## 2017-04-08 DIAGNOSIS — Z993 Dependence on wheelchair: Secondary | ICD-10-CM | POA: Diagnosis not present

## 2017-04-08 DIAGNOSIS — G309 Alzheimer's disease, unspecified: Secondary | ICD-10-CM | POA: Diagnosis present

## 2017-04-08 DIAGNOSIS — Z87891 Personal history of nicotine dependence: Secondary | ICD-10-CM | POA: Diagnosis not present

## 2017-04-08 DIAGNOSIS — Z515 Encounter for palliative care: Secondary | ICD-10-CM | POA: Diagnosis not present

## 2017-04-08 DIAGNOSIS — Z8673 Personal history of transient ischemic attack (TIA), and cerebral infarction without residual deficits: Secondary | ICD-10-CM | POA: Diagnosis not present

## 2017-04-08 DIAGNOSIS — E11622 Type 2 diabetes mellitus with other skin ulcer: Secondary | ICD-10-CM | POA: Diagnosis not present

## 2017-04-08 DIAGNOSIS — Z930 Tracheostomy status: Secondary | ICD-10-CM | POA: Diagnosis not present

## 2017-04-08 DIAGNOSIS — I69393 Ataxia following cerebral infarction: Secondary | ICD-10-CM

## 2017-04-08 DIAGNOSIS — L97419 Non-pressure chronic ulcer of right heel and midfoot with unspecified severity: Secondary | ICD-10-CM | POA: Diagnosis not present

## 2017-04-08 DIAGNOSIS — Z7189 Other specified counseling: Secondary | ICD-10-CM | POA: Diagnosis not present

## 2017-04-08 LAB — COMPREHENSIVE METABOLIC PANEL
ALT: 12 U/L — AB (ref 14–54)
AST: 45 U/L — ABNORMAL HIGH (ref 15–41)
Albumin: 1.8 g/dL — ABNORMAL LOW (ref 3.5–5.0)
Alkaline Phosphatase: 67 U/L (ref 38–126)
Anion gap: 6 (ref 5–15)
BILIRUBIN TOTAL: 0.6 mg/dL (ref 0.3–1.2)
BUN: 21 mg/dL — ABNORMAL HIGH (ref 6–20)
CALCIUM: 8.2 mg/dL — AB (ref 8.9–10.3)
CO2: 24 mmol/L (ref 22–32)
CREATININE: 0.85 mg/dL (ref 0.44–1.00)
Chloride: 104 mmol/L (ref 101–111)
GFR calc Af Amer: >60 mL/min (ref >60)
Glucose, Bld: 129 mg/dL — ABNORMAL HIGH (ref 65–99)
Potassium: 3.7 mmol/L (ref 3.5–5.1)
Sodium: 134 mmol/L — ABNORMAL LOW (ref 135–145)
TOTAL PROTEIN: 6 g/dL — AB (ref 6.5–8.1)

## 2017-04-08 LAB — PROTIME-INR
INR: 1.14
Prothrombin Time: 14.6 seconds (ref 11.4–15.2)

## 2017-04-08 LAB — LACTIC ACID, PLASMA: Lactic Acid, Venous: 1.1 mmol/L (ref 0.5–1.9)

## 2017-04-08 LAB — CBC WITH DIFFERENTIAL/PLATELET
BASOS ABS: 0 10*3/uL (ref 0.0–0.1)
BASOS PCT: 0 %
EOS ABS: 0 10*3/uL (ref 0.0–0.7)
EOS PCT: 0 %
HCT: 19.9 % — ABNORMAL LOW (ref 36.0–46.0)
Hemoglobin: 6.5 g/dL — CL (ref 12.0–15.0)
Lymphocytes Relative: 18 %
Lymphs Abs: 1.8 10*3/uL (ref 0.7–4.0)
MCH: 26.2 pg (ref 26.0–34.0)
MCHC: 32.7 g/dL (ref 30.0–36.0)
MCV: 80.2 fL (ref 78.0–100.0)
Monocytes Absolute: 1.5 10*3/uL — ABNORMAL HIGH (ref 0.1–1.0)
Monocytes Relative: 16 %
Neutro Abs: 6.6 10*3/uL (ref 1.7–7.7)
Neutrophils Relative %: 66 %
PLATELETS: 235 10*3/uL (ref 150–400)
RBC: 2.48 MIL/uL — AB (ref 3.87–5.11)
RDW: 17.2 % — ABNORMAL HIGH (ref 11.5–15.5)
WBC: 9.9 10*3/uL (ref 4.0–10.5)

## 2017-04-08 LAB — PREPARE RBC (CROSSMATCH)

## 2017-04-08 LAB — I-STAT CG4 LACTIC ACID, ED
LACTIC ACID, VENOUS: 1.01 mmol/L (ref 0.5–1.9)
Lactic Acid, Venous: 1.06 mmol/L (ref 0.5–1.9)

## 2017-04-08 LAB — PROCALCITONIN: PROCALCITONIN: 0.81 ng/mL

## 2017-04-08 LAB — APTT: aPTT: 37 seconds — ABNORMAL HIGH (ref 24–36)

## 2017-04-08 MED ORDER — ONDANSETRON HCL 4 MG/2ML IJ SOLN: 4.0000 mg | Freq: Four times a day (QID) | INTRAMUSCULAR | Status: DC | PRN

## 2017-04-08 MED ORDER — PIPERACILLIN-TAZOBACTAM 3.375 G IVPB 30 MIN
3.3750 g | Freq: Once | INTRAVENOUS | Status: AC
  Administered 2017-04-08: 3.375 g via INTRAVENOUS
  Filled 2017-04-08: qty 50

## 2017-04-08 MED ORDER — ONDANSETRON HCL 4 MG PO TABS: 4.0000 mg | ORAL_TABLET | Freq: Four times a day (QID) | ORAL | Status: DC | PRN

## 2017-04-08 MED ORDER — ENOXAPARIN SODIUM 40 MG/0.4ML ~~LOC~~ SOLN
40.0000 mg | Freq: Every day | SUBCUTANEOUS | Status: DC
  Administered 2017-04-10 – 2017-04-14 (×5): 40 mg via SUBCUTANEOUS
  Filled 2017-04-08 (×7): qty 0.4

## 2017-04-08 MED ORDER — VANCOMYCIN HCL IN DEXTROSE 1-5 GM/200ML-% IV SOLN
1000.0000 mg | INTRAVENOUS | Status: DC
  Administered 2017-04-09 – 2017-04-16 (×8): 1000 mg via INTRAVENOUS
  Filled 2017-04-08 (×8): qty 200

## 2017-04-08 MED ORDER — ACETAMINOPHEN 325 MG PO TABS
650.0000 mg | ORAL_TABLET | Freq: Four times a day (QID) | ORAL | Status: DC | PRN
  Administered 2017-04-09: 650 mg via ORAL
  Filled 2017-04-08: qty 2

## 2017-04-08 MED ORDER — PIPERACILLIN-TAZOBACTAM 3.375 G IVPB
3.3750 g | Freq: Three times a day (TID) | INTRAVENOUS | Status: DC
  Administered 2017-04-09 – 2017-04-16 (×23): 3.375 g via INTRAVENOUS
  Filled 2017-04-08 (×23): qty 50

## 2017-04-08 MED ORDER — ACETAMINOPHEN 650 MG RE SUPP
650.0000 mg | Freq: Four times a day (QID) | RECTAL | Status: DC | PRN
  Administered 2017-04-09 – 2017-04-15 (×2): 650 mg via RECTAL
  Filled 2017-04-08 (×2): qty 1

## 2017-04-08 MED ORDER — SODIUM CHLORIDE 0.9 % IV BOLUS (SEPSIS)
1000.0000 mL | Freq: Once | INTRAVENOUS | Status: AC
  Administered 2017-04-08: 1000 mL via INTRAVENOUS

## 2017-04-08 MED ORDER — VANCOMYCIN HCL IN DEXTROSE 1-5 GM/200ML-% IV SOLN
1000.0000 mg | Freq: Once | INTRAVENOUS | Status: AC
Start: 2017-04-08 — End: 2017-04-08
  Administered 2017-04-08: 1000 mg via INTRAVENOUS
  Filled 2017-04-08: qty 200

## 2017-04-08 MED ORDER — SODIUM CHLORIDE 0.9 % IV SOLN
Freq: Once | INTRAVENOUS | Status: AC
  Administered 2017-04-08: 21:00:00 via INTRAVENOUS

## 2017-04-08 NOTE — ED Notes (Signed)
Hospitalist at bedside 

## 2017-04-08 NOTE — ED Triage Notes (Addendum)
Per [facility] pt from [facility] reporting increased lethargy onset of today. Pt has history of stroke with left sided deficits. Staff gave patient 325mg  tylenol at 4 due to fever.  CBG 134

## 2017-04-08 NOTE — Progress Notes (Signed)
Pharmacy Antibiotic Note  Julia Floyd is a 84 y.o. female admitted on 04/08/2017 with sepsis.  Pharmacy has been consulted for vancomycin and zosyn dosing.  Plan: Vancomycin 1g IV q24h for goal AUC 400-500.  Zosyn 3.375g IV q8h (4 hour infusion time).  Daily SCr.  Height: 5\' 6"  (167.6 cm) Weight: 173 lb (78.5 kg) IBW/kg (Calculated) : 59.3  Temp (24hrs), Avg:99.7 F (37.6 C), Min:99.6 F (37.6 C), Max:99.7 F (37.6 C)  Recent Labs  Lab 04/08/17 1900 04/08/17 1909  WBC 9.9  --   CREATININE 0.85  --   LATICACIDVEN  --  1.01    Estimated Creatinine Clearance: 52.1 mL/min (by C-G formula based on SCr of 0.85 mg/dL).    No Known Allergies  Antimicrobials this admission: 1/9 Vanc >>  1/9 Zosyn >>   Dose adjustments this admission:   Microbiology results: 1/9 BCx:  Thank you for allowing pharmacy to be a part of this patient's care.  Hershal Coria 04/08/2017 9:24 PM

## 2017-04-08 NOTE — Progress Notes (Signed)
A consult was received from an ED physician for vancomycin and zosyn per pharmacy dosing.  The patient's profile has been reviewed for ht/wt/allergies/indication/available labs.   A one time order has been placed for vancomycin 1g and zosyn 3.375g.  Further antibiotics/pharmacy consults should be ordered by admitting physician if indicated.                       Thank you, Hershal Coria 04/08/2017  8:57 PM

## 2017-04-08 NOTE — H&P (Addendum)
History and Physical    Julia Floyd TFT:[id number] DOB: 1930/11/02 DOA: 04/08/2017  PCP: Josetta Huddle, MD  Patient coming from: Home  I have personally briefly reviewed patient's old medical records in [place]  Chief Complaint: Fatigue  HPI: Julia Floyd is a 84 y.o. female with medical history significant of dementia, stroke, chronically bed bound with multiple ulcers: B heel ulcers and large stage 4 sacral decubitus likely with osteomyelitis on CT scan 03/13/17.  Admitted from 12/14-12/31 on ABx, got 5 day course of zosyn / vanc followed by LZD until discharge.  LZD apparently stopped after DC.  Patient now back in ED with fever and AMS.  Quit eating and drinking today, normally takes POs.   ED Course: Tm 99.7 (apparently had true fever at SNF PTA however), HGB 6.5 down from 7.4 at discharge 9 days ago.  Lactate neg.  BP dropped into low 80s in ED.  Improved to 70W systolic right now after 237SE of a 1L bolus given.   Review of Systems: Unable to perform due to dementia.  Past Medical History:  Diagnosis Date  . DDD (degenerative disc disease)   . Depression   . Diabetes ([place])   . HTN (hypertension)   . Psychotic disorder ([place])    secondary to the below factors. 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 Bipolar disorder, most likely type 3, this episode being manic  . Sixth cranial nerve palsy   . Stroke ([place])   . Trigeminal neuralgia   . Uterine fibroid     Past Surgical History:  Procedure Laterality Date  . BRONCHOSCOPY    . IRRIGATION AND DEBRIDEMENT ABSCESS N/A 03/16/2017   Procedure: IRRIGATION AND DEBRIDEMENT ABSCESS;  Surgeon: [name], MD;  Location: [facility];  Service: General;  Laterality: N/A;  . TRACHEOSTOMY       reports that she has quit smoking. she has never used smokeless tobacco. She reports that she does not drink alcohol or use drugs.  No Known Allergies  Family History  Problem Relation Age of  Onset  . Heart attack Unknown   . Diabetes Unknown      Prior to Admission medications   Medication Sig Start Date End Date Taking? Authorizing Provider  acetaminophen (TYLENOL) 325 MG tablet Take 650 mg by mouth 2 (two) times daily.   Yes [provider]  cyanocobalamin 500 MCG tablet Take 1 tablet (500 mcg total) by mouth daily. Vitamin B12 10/29/16  Yes Angiulli, Lavon Paganini, PA-C  cyclobenzaprine (FLEXERIL) 5 MG tablet Take 1 tablet (5 mg total) by mouth 3 (three) times daily as needed for muscle spasms. 03/30/17  Yes Patrecia Pour, MD  furosemide (LASIX) 20 MG tablet Take 1 tablet (20 mg total) by mouth daily. 10/29/16  Yes Angiulli, Lavon Paganini, PA-C  gabapentin (NEURONTIN) 100 MG capsule Take 1-2 capsules (100-200 mg total) by mouth at bedtime. 10/29/16  Yes Angiulli, Lavon Paganini, PA-C  Multiple Vitamins-Minerals (DECUBI-VITE) CAPS Take 1 capsule by mouth daily.   Yes [provider]  Omega-3 Fatty Acids (FISH OIL PO) Take 1 capsule daily by mouth.   Yes [provider]  VITAMIN D, CHOLECALCIFEROL, PO Take 1 capsule daily by mouth.   Yes [provider]    Physical Exam: Vitals:   04/08/17 1950 04/08/17 2031 04/08/17 2047 04/08/17 2100  BP:  (!) 88/40 (!) 83/37 (!) 98/38  Pulse:  98 98 94  Resp:  (!) 24 (!) 22 (!) 21  Temp: 99.7 F (37.6 C)     TempSrc: Rectal     SpO2:  100% 100% 100%  Weight:      Height:        Constitutional: NAD, calm, comfortable Eyes: PERRL, lids and conjunctivae normal ENMT: Mucous membranes are moist. Posterior pharynx clear of any exudate or lesions.Normal dentition.  Neck: normal, supple, no masses, no thyromegaly Respiratory: clear to auscultation bilaterally, no wheezing, no crackles. Normal respiratory effort. No accessory muscle use.  Cardiovascular: Regular rate and rhythm, no murmurs / rubs / gallops. No extremity edema. 2+ pedal pulses. No carotid bruits.  Abdomen: no tenderness, no masses palpated. No  hepatosplenomegaly. Bowel sounds positive.  Musculoskeletal: no clubbing / cyanosis. No joint deformity upper and lower extremities. Good ROM, no contractures. Normal muscle tone.  Skin: Large sacral ulcer with wound vac in place.  B heel ulcers under dressings. Neurologic: CN 2-12 grossly intact. Sensation intact, DTR normal. Strength 5/5 in all 4.  Psychiatric: Oriented to self only.   Labs on Admission: I have personally reviewed following labs and imaging studies  CBC: Recent Labs  Lab 04/08/17 1900  WBC 9.9  NEUTROABS 6.6  HGB 6.5*  HCT 19.9*  MCV 80.2  PLT 841   Basic Metabolic Panel: Recent Labs  Lab 04/08/17 1900  NA 134*  K 3.7  CL 104  CO2 24  GLUCOSE 129*  BUN 21*  CREATININE 0.85  CALCIUM 8.2*   GFR: Estimated Creatinine Clearance: 52.1 mL/min (by C-G formula based on SCr of 0.85 mg/dL). Liver Function Tests: Recent Labs  Lab 04/08/17 1900  AST 45*  ALT 12*  ALKPHOS 67  BILITOT 0.6  PROT 6.0*  ALBUMIN 1.8*   No results for input(s): LIPASE, AMYLASE in the last 168 hours. No results for input(s): AMMONIA in the last 168 hours. Coagulation Profile: No results for input(s): INR, PROTIME in the last 168 hours. Cardiac Enzymes: No results for input(s): CKTOTAL, CKMB, CKMBINDEX, TROPONINI in the last 168 hours. BNP (last 3 results) No results for input(s): PROBNP in the last 8760 hours. HbA1C: No results for input(s): HGBA1C in the last 72 hours. CBG: No results for input(s): GLUCAP in the last 168 hours. Lipid Profile: No results for input(s): CHOL, HDL, LDLCALC, TRIG, CHOLHDL, LDLDIRECT in the last 72 hours. Thyroid Function Tests: No results for input(s): TSH, T4TOTAL, FREET4, T3FREE, THYROIDAB in the last 72 hours. Anemia Panel: No results for input(s): VITAMINB12, FOLATE, FERRITIN, TIBC, IRON, RETICCTPCT in the last 72 hours. Urine analysis:    Component Value Date/Time   COLORURINE YELLOW 03/21/2017 1928   APPEARANCEUR HAZY (A)  03/21/2017 1928   LABSPEC 1.018 03/21/2017 1928   PHURINE 5.0 03/21/2017 1928   GLUCOSEU NEGATIVE 03/21/2017 1928   HGBUR NEGATIVE 03/21/2017 1928   BILIRUBINUR NEGATIVE 03/21/2017 1928   KETONESUR NEGATIVE 03/21/2017 1928   PROTEINUR NEGATIVE 03/21/2017 1928   NITRITE NEGATIVE 03/21/2017 1928   LEUKOCYTESUR SMALL (A) 03/21/2017 1928    Radiological Exams on Admission: Dg Chest 2 View  Result Date: 04/08/2017 CLINICAL DATA:  Fever and lethargy. EXAM: CHEST  2 VIEW COMPARISON:  03/21/2017 FINDINGS: Cardiomediastinal silhouette is normal. Mediastinal contours appear intact. Calcific atherosclerotic disease and tortuosity of the aorta. There is no evidence of focal airspace consolidation, pleural effusion or pneumothorax. Osseous structures are without acute abnormality. Soft tissues are grossly normal. IMPRESSION: No active cardiopulmonary disease. Electronically Signed   By: Fidela Salisbury M.D.   On: 04/08/2017 19:53   Dg Pelvis 1-2  Views  Result Date: 04/08/2017 CLINICAL DATA:  Pelvic pain.  Question osteomyelitis. EXAM: PELVIS - 1-2 VIEW COMPARISON:  03/13/2017 abdomen and pelvis CT. FINDINGS: Detection of osteomyelitis is not possible due to degree of calcified fibroids. Osteopenia, and bowel gas. There was a large ulcer over the left lower sacrum on previous CT. No evidence of fracture or dislocation. IMPRESSION: Nondiagnostic for sacral osteomyelitis due to overlapping calcified fibroids and bowel gas. No acute finding. Electronically Signed   By: Monte Fantasia M.D.   On: 04/08/2017 20:57   Ct Head Wo Contrast  Result Date: 04/08/2017 CLINICAL DATA:  Altered level consciousness. History of hypertension, dementia, stroke and LEFT-sided deficits. EXAM: CT HEAD WITHOUT CONTRAST TECHNIQUE: Contiguous axial images were obtained from the base of the skull through the vertex without intravenous contrast. COMPARISON:  MRI of the head January 11, 2017 and CT HEAD October 09, 2016. FINDINGS: BRAIN:  No intraparenchymal hemorrhage, mass effect nor midline shift. The ventricles and sulci are normal for age. Patchy supratentorial white matter hypodensities, with multifocal subcortical supra and infratentorial infarcts at site of prior hemorrhage. No acute large vascular territory infarcts. No abnormal extra-axial fluid collections. Basal cisterns are patent. VASCULAR: Moderate calcific atherosclerosis of the carotid siphons. SKULL: No skull fracture. No significant scalp soft tissue swelling. SINUSES/ORBITS: LEFT maxillary mucosal retention cyst. No paranasal sinus air-fluid levels. Mastoid air cells are well aerated. Included ocular globes and orbital contents are non-suspicious. OTHER: None. IMPRESSION: 1. No acute intracranial process. 2. Moderate chronic small vessel ischemic disease. Multifocal small old infarcts corresponding to hemorrhage on prior CT. Electronically Signed   By: Elon Alas M.D.   On: 04/08/2017 19:50    EKG: Independently reviewed.  Assessment/Plan Principal Problem:   Sepsis associated hypotension (HCC) Active Problems:   Gait disturbance, post-stroke   Dysphagia   Alzheimer's dementia   Skin ulcer of sacrum with necrosis of muscle (HCC)   Sacral osteomyelitis (HCC)   Anemia of chronic disease    1. Sepsis associated hypotension -  1. Source: osteomyelitis of sacrum vs UTI 2. UA pending still 3. BCx pending 4. IVF: 1L NS bolus then 2u PRBC transfusion 5. Zosyn and vanc empirically 6. Tele monitor 7. Wound care consult 2. Anemia - 1. HGB 6.5 down from 7.4 at time of discharge 2. Looks like anemia of chronic disease, see also iron studies from last admit 3. Transfusing 2 units PRBC as above 4. Repeat CBC in AM 3. Dysphagia - 1. Looks like she was on Dys 2 diet with thin liquids last admit 4. Alzheimer's dementia - chronic and stable 5. Chronic debility - 1. Pal care consult 2. Still full code at the moment per family  DVT prophylaxis: Lovenox Code  Status: Full Family Communication: Family at bedsie Disposition Plan: SNF after admit Consults called: Pal care Admission status: Admit to inpatient - inpatient status for sepsis associated hypotension   GARDNER, [place] [facility] Pager [phone]  If 7AM-7PM, please contact day team taking care of patient [email or web address] Password [facility]  04/08/2017, 9:31 PM

## 2017-04-08 NOTE — ED Notes (Signed)
Portable xray at bedside.

## 2017-04-08 NOTE — ED Notes (Signed)
Family at bedside. 

## 2017-04-08 NOTE — ED Provider Notes (Addendum)
[facility] DEPT Provider Note   CSN: [id number] Arrival date & time: 04/08/17  1752     History   Chief Complaint Chief Complaint  Patient presents with  . Fatigue    HPI Julia Floyd is a 84 y.o. female.  HPI   84 year old female with past medical history significant for dementia, with psychosis.Marland Kitchen  History of stroke, 6th nerve palsy, depression, chronically nonmobile with multiple ulcers.  Including large sacral ulcer with wound VAC.  Patient here with fever and altered mental status.  [facility]  The family reports that she had some twitching in her right face.  I have reviewed a video that it appears just to be muscle twitching.  Patient also reportedly to have a fever.  No fever on arrival here.    Past Medical History:  Diagnosis Date  . DDD (degenerative disc disease)   . Depression   . Diabetes ([place])   . HTN (hypertension)   . Psychotic disorder ([place])    secondary to the below factors. 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 Bipolar disorder, most likely type 3, this episode being manic  . Sixth cranial nerve palsy   . Stroke ([place])   . Trigeminal neuralgia   . Uterine fibroid     Patient Active Problem List   Diagnosis Date Noted  . Encounter for palliative care   . Goals of care, counseling/discussion   . Peripheral arterial disease ([place]) 03/18/2017  . Sacral osteomyelitis ([place]) 03/17/2017  . Open wound of right heel 03/17/2017  . Aortic atherosclerosis ([place]) 03/17/2017  . Skin ulcer of sacrum with necrosis of muscle ([place]) 03/14/2017  . Normochromic normocytic anemia 03/14/2017  . Closed trimalleolar fracture, right, initial encounter 02/05/2017  . Closed dislocation of right ankle 01/14/2017  . Dysphagia 01/14/2017  . History of CVA (cerebrovascular accident) 01/14/2017  . Acute urinary retention 01/14/2017  . Alzheimer's dementia 01/14/2017  . Benign essential HTN   .  Neuropathic pain   . Urinary incontinence   . Constipation   . Diabetes mellitus type 2 in nonobese (HCC)   . Acute blood loss anemia   . Gait disturbance, post-stroke   . Intraparenchymal hemorrhage of brain ([place]) 10/14/2016  . Received tissue plasminogen activator (t-PA) less than 24 hours prior to arrival 10/09/2016  . ICH (intracerebral hemorrhage) ([place]) 10/09/2016  . Cytotoxic brain edema ([place]) 10/09/2016  . Stroke (cerebrum) ([place]) -  Suspect stroke/TIA s/p tPA with multifocal ICH - etiology unclear, concerning for underlying CAA 10/07/2016  . Diabetes ([place])   . Depression   . DDD (degenerative disc disease)   . Uterine fibroid   . Trigeminal neuralgia   . Sixth cranial nerve palsy     Past Surgical History:  Procedure Laterality Date  . BRONCHOSCOPY    . IRRIGATION AND DEBRIDEMENT ABSCESS N/A 03/16/2017   Procedure: IRRIGATION AND DEBRIDEMENT ABSCESS;  Surgeon: [name], MD;  Location: [facility]S;  Service: General;  Laterality: N/A;  . TRACHEOSTOMY      OB History    No data available       Home Medications    Prior to Admission medications   Medication Sig Start Date End Date Taking? Authorizing Provider  acetaminophen (TYLENOL) 325 MG tablet Take 650 mg by mouth 2 (two) times daily.   Yes [provider]  cyanocobalamin 500 MCG tablet Take 1 tablet (500 mcg total) by mouth daily. Vitamin B12 10/29/16  Yes [place],  Lavon Paganini, PA-C  cyclobenzaprine (FLEXERIL) 5 MG tablet Take 1 tablet (5 mg total) by mouth 3 (three) times daily as needed for muscle spasms. 03/30/17  Yes Patrecia Pour, MD  furosemide (LASIX) 20 MG tablet Take 1 tablet (20 mg total) by mouth daily. 10/29/16  Yes Ang[place]ull[place], Lavon Paganini, PA-C  gabapentin (NEURONTIN) 100 MG capsule Take 1-2 capsules (100-200 mg total) by mouth at bedtime. 10/29/16  Yes Angiulli, Lavon Paganini, PA-C  Multiple Vitamins-Minerals (DECUBI-VITE) CAPS Take 1 capsule by mouth daily.   Yes [provider]  Omega-3 Fatty  Acids (FISH OIL PO) Take 1 capsule daily by mouth.   Yes [provider]  VITAMIN D, CHOLECALCIFEROL, PO Take 1 capsule daily by mouth.   Yes [provider]  linezolid (ZYVOX) 600 MG tablet Take 1 tablet (600 mg total) by mouth every 12 (twelve) hours. Patient not taking: Reported on 04/08/2017 03/30/17   Patrecia Pour, MD    Family History Family History  Problem Relation Age of Onset  . Heart attack Unknown   . Diabetes Unknown     Social History Social History   Tobacco Use  . Smoking status: Former Research scientist (life sciences)  . Smokeless tobacco: Never Used  Substance Use Topics  . Alcohol use: No  . Drug use: No     Allergies   Patient has no known allergies.   Review of Systems Review of Systems  Unable to perform ROS: Dementia  Constitutional: Positive for fever. Negative for activity change.     Physical Exam Updated Vital Signs BP (!) 88/40   Pulse 98   Temp 99.7 F (37.6 C) (Rectal)   Resp (!) 24   Ht 5\' 6"  (1.676 m)   Wt 78.5 kg (173 lb)   SpO2 100%   BMI 27.92 kg/m   Physical Exam  Constitutional: She appears well-developed and well-nourished.  Frail elderly female.  HENT:  Head: Normocephalic and atraumatic.  Eyes: Right eye exhibits no discharge. Left eye exhibits no discharge.  Cardiovascular: Normal rate.  No murmur heard. Pulmonary/Chest: Effort normal and breath sounds normal. She has no wheezes. She has no rales.  Abdominal: Soft. She exhibits no distension. There is no tenderness.  Large sacral ulcer with wound VAC in place.  Neurological:  Oriented to self only.  Skin: Skin is warm and dry. She is not diaphoretic.  2 black eschars in bilateral heels, no surrounding erythema.  Nursing note and vitals reviewed.    ED Treatments / Results  Labs (all labs ordered are listed, but only abnormal results are displayed) Labs Reviewed  COMPREHENSIVE METABOLIC PANEL - Abnormal; Notable for the following components:      Result Value    Sodium 134 (*)    Glucose, Bld 129 (*)    BUN 21 (*)    Calcium 8.2 (*)    Total Protein 6.0 (*)    Albumin 1.8 (*)    AST 45 (*)    ALT 12 (*)    All other components within normal limits  CBC WITH DIFFERENTIAL/PLATELET - Abnormal; Notable for the following components:   RBC 2.48 (*)    Hemoglobin 6.5 (*)    HCT 19.9 (*)    RDW 17.2 (*)    Monocytes Absolute 1.5 (*)    All other components within normal limits  CULTURE, BLOOD (ROUTINE X 2)  CULTURE, BLOOD (ROUTINE X 2)  URINALYSIS, ROUTINE W REFLEX MICROSCOPIC  I-STAT CG4 LACTIC ACID, ED  TYPE AND SCREEN  EKG  EKG Interpretation None       Radiology Dg Chest 2 View  Result Date: 04/08/2017 CLINICAL DATA:  Fever and lethargy. EXAM: CHEST  2 VIEW COMPARISON:  03/21/2017 FINDINGS: Cardiomediastinal silhouette is normal. Mediastinal contours appear intact. Calcific atherosclerotic disease and tortuosity of the aorta. There is no evidence of focal airspace consolidation, pleural effusion or pneumothorax. Osseous structures are without acute abnormality. Soft tissues are grossly normal. IMPRESSION: No active cardiopulmonary disease. Electronically Signed   By: Fidela Salisbury M.D.   On: 04/08/2017 19:53   Ct Head Wo Contrast  Result Date: 04/08/2017 CLINICAL DATA:  Altered level consciousness. History of hypertension, dementia, stroke and LEFT-sided deficits. EXAM: CT HEAD WITHOUT CONTRAST TECHNIQUE: Contiguous axial images were obtained from the base of the skull through the vertex without intravenous contrast. COMPARISON:  MRI of the head January 11, 2017 and CT HEAD October 09, 2016. FINDINGS: BRAIN: No intraparenchymal hemorrhage, mass effect nor midline shift. The ventricles and sulci are normal for age. Patchy supratentorial white matter hypodensities, with multifocal subcortical supra and infratentorial infarcts at site of prior hemorrhage. No acute large vascular territory infarcts. No abnormal extra-axial fluid collections.  Basal cisterns are patent. VASCULAR: Moderate calcific atherosclerosis of the carotid siphons. SKULL: No skull fracture. No significant scalp soft tissue swelling. SINUSES/ORBITS: LEFT maxillary mucosal retention cyst. No paranasal sinus air-fluid levels. Mastoid air cells are well aerated. Included ocular globes and orbital contents are non-suspicious. OTHER: None. IMPRESSION: 1. No acute intracranial process. 2. Moderate chronic small vessel ischemic disease. Multifocal small old infarcts corresponding to hemorrhage on prior CT. Electronically Signed   By: Elon Alas M.D.   On: 04/08/2017 19:50    Procedures Procedures (including critical care time)  Medications Ordered in ED Medications - No data to display   Initial Impression / Assessment and Plan / ED Course  I have reviewed the triage vital signs and the nursing notes.  Pertinent labs & imaging results that were available during my care of the patient were reviewed by me and considered in my medical decision making (see chart for details).     84 year old female with past medical history significant for dementia, with psychosis.Marland Kitchen  History of stroke, 6th nerve palsy, depression, chronically nonmobile with multiple ulcers.  Including large sacral ulcer with wound VAC.  Patient here with fever and altered mental status.  [facility]  The family reports that she had some twitching in her right face.  I have reviewed a video that it appears just to be muscle twitching.  Patient also reportedly to have a fever.  No fever on arrival here.  8:41 PM Had extensive conversation with family patient's CODE STATUS.  Patient is currently nonmobile, demented but wishes to be full code.  Patient has multiple wound vacs, and appears very frail given her age we discussed [place] but patient family adamant about current status.  Today patient had some strange muscle twitching which I saw video and I do not believe it  represents any kind of stroke that the family is worried by it.  However patient was febrile to prior to arrival.  Lactic was negative but initially thought to be septic.  Patient has low blood pressures however she is nonmobile and frail and thin so I think is likely secondary to this.  Patient's last hospitalization she was treated for osteomyelitis with L Linezolid.  Patient also seen by Dr. Bridgett Larsson at that point for vascular insufficiency, nothing to  be done.  Patient also transfused 2 units.  Thought to be stable iron deficiency anemia.Patient does have a low hemoglobin 6.7 which is new today.  Will admit to trend hemoglobin.  I think patient's fever and low blood pressures today are likely secondary to osteomyelitis again.  Patient was stopped on Linezolid prior to discharge.  Will treat with braod spectrum abx, admit for further care.   Patient transietnly hypotensive, improved with fluids. Lactic negative.  CRITICAL CARE Performed by: Gardiner Sleeper Total critical care time: 60 minutes Critical care time was exclusive of separately billable procedures and treating other patients. Critical care was necessary to treat or prevent imminent or life-threatening deterioration. Critical care was time spent personally by me on the following activities: development of treatment plan with patient and/or surrogate as well as nursing, discussions with consultants, evaluation of patient's response to treatment, examination of patient, obtaining history from patient or surrogate, ordering and performing treatments and interventions, ordering and review of laboratory studies, ordering and review of radiographic studies, pulse oximetry and re-evaluation of patient's condition.   Final Clinical Impressions(s) / ED Diagnoses   Final diagnoses:  None    ED Discharge Orders    None       Macarthur Critchley, MD 04/08/17 2332    Macarthur Critchley, MD 05/21/2017 1513

## 2017-04-08 NOTE — ED Notes (Signed)
Bed: WA07 Expected date:  Expected time:  Means of arrival:  Comments: Ems/fever

## 2017-04-09 ENCOUNTER — Other Ambulatory Visit: Payer: Self-pay

## 2017-04-09 DIAGNOSIS — I69398 Other sequelae of cerebral infarction: Secondary | ICD-10-CM

## 2017-04-09 DIAGNOSIS — L98423 Non-pressure chronic ulcer of back with necrosis of muscle: Secondary | ICD-10-CM

## 2017-04-09 DIAGNOSIS — R269 Unspecified abnormalities of gait and mobility: Secondary | ICD-10-CM

## 2017-04-09 LAB — BASIC METABOLIC PANEL
ANION GAP: 7 (ref 5–15)
BUN: 20 mg/dL (ref 6–20)
CALCIUM: 7.7 mg/dL — AB (ref 8.9–10.3)
CHLORIDE: 108 mmol/L (ref 101–111)
CO2: 21 mmol/L — AB (ref 22–32)
CREATININE: 0.72 mg/dL (ref 0.44–1.00)
GFR calc non Af Amer: >60 mL/min (ref >60)
Glucose, Bld: 115 mg/dL — ABNORMAL HIGH (ref 65–99)
Potassium: 3.6 mmol/L (ref 3.5–5.1)
Sodium: 136 mmol/L (ref 135–145)

## 2017-04-09 LAB — LACTIC ACID, PLASMA: LACTIC ACID, VENOUS: 1 mmol/L (ref 0.5–1.9)

## 2017-04-09 LAB — CBC
HEMATOCRIT: 26 % — AB (ref 36.0–46.0)
Hemoglobin: 8.7 g/dL — ABNORMAL LOW (ref 12.0–15.0)
MCH: 27.4 pg (ref 26.0–34.0)
MCHC: 33.5 g/dL (ref 30.0–36.0)
MCV: 82 fL (ref 78.0–100.0)
Platelets: 206 10*3/uL (ref 150–400)
RBC: 3.17 MIL/uL — ABNORMAL LOW (ref 3.87–5.11)
RDW: 16.3 % — AB (ref 11.5–15.5)
WBC: 11.4 10*3/uL — ABNORMAL HIGH (ref 4.0–10.5)

## 2017-04-09 MED ORDER — COLLAGENASE 250 UNIT/GM EX OINT
TOPICAL_OINTMENT | Freq: Two times a day (BID) | CUTANEOUS | Status: DC
  Administered 2017-04-09 – 2017-04-22 (×24): via TOPICAL
  Filled 2017-04-09 (×3): qty 90

## 2017-04-09 NOTE — Consult Note (Signed)
Consultation Note Date: 04/09/2017   Patient Name: Julia Floyd  DOB: 1930-06-26  MRN: [id number]  Age / Sex: 84 y.o., female  PCP: Josetta Huddle, MD Referring Physician: Patrecia Pour, MD  Reason for Consultation: Establishing goals of care  HPI/Patient Profile: 84 y.o. female   admitted on 04/08/2017    Clinical Assessment and Goals of Care:  84 yo lady with dementia, stroke, bed bound, was hospitalized recently with sacral stage IV decubitus, left upper thigh wound, bilateral heel wound, also has peripheral arterial disease, DM, dysphagia. Patient was seen by palliative team at that time, patient elected full code/full scope, her family elected for the patient to go for SNF rehab, to get antibiotics and wound care.   The patient is re admitted to the hospital now, she came from [facility] with increasing lethargy, she is admitted with sepsis, possible source being her sacral decubitus, she is placed on IV antibiotics, surgical colleagues have been re consulted, and palliative medicine has also been consulted to continue to address goals of care.   Ms Kachel is resting in bed in the ED, her grand daughter Yvetta Coder is at bedside, I introduced myself and palliative care as follows: Palliative medicine is specialized medical care for people living with serious illness. It focuses on providing relief from the symptoms and stress of a serious illness. The goal is to improve quality of life for both the patient and the family.  Goals , wishes and values important to the patient attempted to be addressed, brief life review also performed, see below, thank you for the consult.   NEXT OF KIN  there is no established HCPOA agent, there is a grand daughter Alexis Frock [phone] who is primary contact and who is currently at the bedside in the ED.  Patient has 2 daughters, one grand son and a friend  Caren Griffins. As far as Yvetta Coder knows, the patient has not made papers designating some one to make medical decisions on her behalf.   SUMMARY OF RECOMMENDATIONS    1. Patient maintains that she would want full scope of resuscitation attempt at end of life, discussed also with grand daughter at bedside, patient wishes to discuss more with her family members, she also would like to think some more about who ought to be her designated health care power of attorney.  2. Continue current treatment. PMT to follow hospital course, monitor trajectory and address decision making and further discussions.   Code Status/Advance Care Planning:  Full code    Symptom Management:    as above   Palliative Prophylaxis:   Bowel Regimen  Additional Recommendations (Limitations, Scope, Preferences):  Full Scope Treatment  Psycho-social/Spiritual:   Desire for further Chaplaincy support:yes  Additional Recommendations: Caregiving  Support/Resources  Prognosis:   Guarded   Discharge Planning: To Be Determined      Primary Diagnoses: Present on Admission: . Sepsis associated hypotension ([place]) . Alzheimer's dementia . Anemia of chronic disease . Sacral osteomyelitis ([place]) . Skin ulcer of sacrum  with necrosis of muscle ([place])   I have reviewed the medical record, interviewed the patient and family, and examined the patient. The following aspects are pertinent.  Past Medical History:  Diagnosis Date  . DDD (degenerative disc disease)   . Depression   . Diabetes ([place])   . HTN (hypertension)   . Psychotic disorder ([place])    secondary to the below factors. 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 Bipolar disorder, most likely type 3, this episode being manic  . Sixth cranial nerve palsy   . Stroke ([place])   . Trigeminal neuralgia   . Uterine fibroid    Social History   Socioeconomic History  . Marital status: Widowed    Spouse name: None  . Number of children: None  .  Years of education: None  . Highest education level: None  Social Needs  . Financial resource strain: None  . Food insecurity - worry: None  . Food insecurity - inability: None  . Transportation needs - medical: None  . Transportation needs - non-medical: None  Occupational History  . None  Tobacco Use  . Smoking status: Former Research scientist (life sciences)  . Smokeless tobacco: Never Used  Substance and Sexual Activity  . Alcohol use: No  . Drug use: No  . Sexual activity: No  Other Topics Concern  . None  Social History Narrative  . None   Family History  Problem Relation Age of Onset  . Heart attack Unknown   . Diabetes Unknown    Scheduled Meds: . collagenase   Topical BID  . enoxaparin (LOVENOX) injection  40 mg Subcutaneous QHS   Continuous Infusions: . piperacillin-tazobactam (ZOSYN)  IV 3.375 g (04/09/17 1211)  . vancomycin Stopped (04/09/17 0850)   PRN Meds:.acetaminophen **OR** acetaminophen, ondansetron **OR** ondansetron (ZOFRAN) IV Medications Prior to Admission:  Prior to Admission medications   Medication Sig Start Date End Date Taking? Authorizing Provider  acetaminophen (TYLENOL) 325 MG tablet Take 650 mg by mouth 2 (two) times daily.   Yes [provider]  cyanocobalamin 500 MCG tablet Take 1 tablet (500 mcg total) by mouth daily. Vitamin B12 10/29/16  Yes Angiulli, Lavon Paganini, PA-C  cyclobenzaprine (FLEXERIL) 5 MG tablet Take 1 tablet (5 mg total) by mouth 3 (three) times daily as needed for muscle spasms. 03/30/17  Yes Patrecia Pour, MD  furosemide (LASIX) 20 MG tablet Take 1 tablet (20 mg total) by mouth daily. 10/29/16  Yes Angiulli, Lavon Paganini, PA-C  gabapentin (NEURONTIN) 100 MG capsule Take 1-2 capsules (100-200 mg total) by mouth at bedtime. 10/29/16  Yes Angiulli, Lavon Paganini, PA-C  Multiple Vitamins-Minerals (DECUBI-VITE) CAPS Take 1 capsule by mouth daily.   Yes [provider]  Omega-3 Fatty Acids (FISH OIL PO) Take 1 capsule daily by mouth.   Yes [provider]  VITAMIN D, CHOLECALCIFEROL, PO Take 1 capsule daily by mouth.   Yes [provider]   No Known Allergies Review of Systems +weakness +pain in back  Physical Exam Awake, opens eyes, answers a few questions appropriately Clear S1 S2 Abdomen soft Has wounds on both feet Bed bound, confused at times Picture of wound noted in surgical colleague's note.   Vital Signs: BP (!) 147/73 (BP Location: Right Arm)   Pulse 99   Temp (!) 100.4 F (38 C) (Rectal)   Resp 20   Ht 5\' 6"  (1.676 m)   Wt 78.5 kg (173 lb)   SpO2 99%   BMI 27.92 kg/m  Pain Assessment: 0-10   Pain Score: 6    SpO2: SpO2: 99 % O2 Device:SpO2: 99 % O2 Flow Rate: .   IO: Intake/output summary:   Intake/Output Summary (Last 24 hours) at 04/09/2017 1545 Last data filed at 04/09/2017 0850 Gross per 24 hour  Intake 2715 ml  Output -  Net 2715 ml    LBM:   Baseline Weight: Weight: 78.5 kg (173 lb) Most recent weight: Weight: 78.5 kg (173 lb)     Palliative Assessment/Data:   PPS 20-30%  Time In:  1400 Time Out:  1500 Time Total:  60 min  Greater than 50%  of this time was spent counseling and coordinating care related to the above assessment and plan.  Signed by: Loistine Chance, MD  [phone] Please contact Palliative Medicine Team phone at [phone] for questions and concerns.  For individual provider: See Shea Evans

## 2017-04-09 NOTE — ED Notes (Signed)
PA changed patients wound dressing.

## 2017-04-09 NOTE — Consult Note (Signed)
[facility] Nurse wound consult note Reason for Consult: sacral stage IV, left ischial healing stage IV, unstageable bilateral heel wounds, left lateral leg wound and left lateral foot, medial right great toe DTIs.  Wound type:pressure Pressure Injury POA: Yes Measurement: left lateral leg  Wound bed: sacral wound 17cm x 10cm x 3cm with a 4cm undermining from 12-3 o'clock. 75% red wound bed with 25% tan loose slough, malodorous, moderate tan exudate. Left ischial 3cm x 7cm x 2cm with undermining from 12-2 o'clock, wound bed red, slick, non granulating. Left lateral leg 4cm x 2cm black DTI with 1cm partial thickness wound draining dark exudate. Left lateral foot just proximal to 5th digit 6cm x 2cm dark DTI.  Left medial heel 5cm x 4cm unstageable black hardened eschar. Left lateral heel scar tissue from healing wound. Right medial foot at base of great toe 1cm x 2cm black DTI. Dorsal surface of right foot with unstageable 2cm x 1.5cm black hardened eschar. Right heel with 5cm x 8cm unstageable with black hardened eschar. Drainage (amount, consistency, odor) see above Periwound: intact, dry Dressing procedure/placement/frequency:I have provided nurses with orders for foam dressing to left lateral DTI area that has draining partial tissue wound. To bilateral heels and eschar on dorsal surface of right foot paint with Betadine BID, let dry, open to air, place feet in [employer] which pt brought with her. To sacral wound I will order Santyl BID dressing changes with Hydrotherapy to start tomorrow, 04/10/17, daily except Sunday. To left ischial wound, wet to dry with NS when performing sacral dressing BID. Will reassess beginning of week and discuss with PT whether Hydrotherapy needs to continue. Nutritional level poor recommend supplements or nutritional consult, please order if you agree. We will follow this patient and remain available to this patient, nursing, and the medical and surgical teams.  Please  re-consult if we need to assist further in between visits.   Fara Olden, RN-C, WTA-C Wound Treatment Associate

## 2017-04-09 NOTE — Progress Notes (Signed)
CC:  Fever and altered Mental status  Subjective: Complicated elderly female with dementia who is bedbound.  Hospitalized in December 2018 and underwent incision and debridement of the sacral stage IV decubitus(15 x 17 x 3 cm, including subcutaneous fascia, muscle, and periosteum.  Left upper posterior full-thickness wound 3 x 2 x 2 cm), 03/16/17 Dr. Jens Som.  Patient was also seen by Dr. Bridgett Larsson vascular surgery during this admission with decubitus ulcers and bilateral heel ulcers consistent with chronic bedbound status.  It was his opinion that he could not save both feet with debridements and unroofing of her current ulcers.  He said this would result in bilateral AKA's and he recommended palliative approach to care.  Patient presented last evening from [facility] rehab with increased lethargic.  There is also some discussion of some twitching of her face which is discussed but not well documented. Workup in the ED shows her temperature is 99.6 99.7 range.  Respiratory rate is in the 20s.  Her heart rate is in the 90s.  Blood pressure vacillates but she is somewhat hypotensive on admission. Admission labs: Sodium is 134 glucose is 129 creatinine is 0.85.  Albumin is low 1.8 AST 45 ALT 12 total protein is 6 total bilirubin is 0.6.  Lactate is normal at 1.06.  Pro-calcitonin 0.81 WBC is 9.9, hemoglobin 6.5, hematocrit 19.9 platelets 235,000. CT of the head on admission shows no acute intracranial process there is moderate chronic small vessel ischemic disease and multifocal small old infarcts corresponding to hemorrhage on a prior CT.  Chest x-ray showed no active cardiopulmonary disease.  Two-view pelvic: Was nondiagnostic for sacral osteomyelitis secondary to overlapping calcified fibroids in bowel  gas.  There were no acute findings. CT from 03/13/17: Suggestion of mild erosion at the left-sided sacrum, concerning for underlying osteomyelitis. She was seen and admitted for sepsis and  we are asked to see.  She subsequently been transfused for her anemia.  It was his opinion her dysphagia was stable on her current dysphagia 2 diet. Alzheimer's disease and chronic debility were stable alsso.  ROS could not be obtained secondary to patient's dementia.  All information came from granddaughter who is with her.  Objective: Vital signs in last 24 hours: Temp:  [98.4 F (36.9 C)-99.7 F (37.6 C)] 99.1 F (37.3 C) (01/10 1217) Pulse Rate:  [82-99] 94 (01/10 1214) Resp:  [15-25] 20 (01/10 1214) BP: (83-151)/(36-72) 126/67 (01/10 1214) SpO2:  [99 %-100 %] 100 % (01/10 1214) Weight:  [78.5 kg (173 lb)] 78.5 kg (173 lb) (01/09 1821)    Intake/Output from previous day: 01/09 0701 - 01/10 0700 In: 2465 [Blood:915; IV Piggyback:1250] Out: -  Intake/Output this shift: Total I/O In: 250 [IV Piggyback:250] Out: -   General appearance: Patient is awake and trying to talk.  But she cannot really answer many questions.  She thinks.  she is in [facility] but cannot tell the year. Resp: clear to auscultation bilaterally Cardio: Tachycardic.  It looks regular but I cannot tell from the current tracing whether it sinus or atrial fibrillation heart rates in the 100 range. GI: soft, non-tender; bowel sounds normal; no masses,  no organomegaly Extremities: Both feet have large and dry calcaneal ulcers but they are dry.  I do not feel any crepitus and there is no wet drainage. Skin: Skin color, texture, turgor normal. No rashes or lesions or I have a picture below of her wound on 02/2817 and then the one  taken today on 04/09/17 She has a wick in place, for urinary control Neurologic: Patient is confused and has difficulty answering questions.  It takes 2 people to move or even to her side.  She is completely helpless to move about the bed on her own.  She is also incontinent.        Wound from  12/28/    This is the left buttocks.  Rectum is just below the large open  portion.  The upper portion of the wound is a bit soupy and still somewhat necrotic.  Bone is palpable.  I would say 80-90% of this open wound looks good.  There is about 10% bit soupy at the very top portion.  This involves the undermining which is about 2-3 cm deep.  Lab Results:  Recent Labs    04/08/17 1900 04/09/17 0527  WBC 9.9 11.4*  HGB 6.5* 8.7*  HCT 19.9* 26.0*  PLT 235 206    BMET Recent Labs    04/08/17 1900 04/09/17 0527  NA 134* 136  K 3.7 3.6  CL 104 108  CO2 24 21*  GLUCOSE 129* 115*  BUN 21* 20  CREATININE 0.85 0.72  CALCIUM 8.2* 7.7*   PT/INR Recent Labs    04/08/17 2115  LABPROT 14.6  INR 1.14    Recent Labs  Lab 04/08/17 1900  AST 45*  ALT 12*  ALKPHOS 67  BILITOT 0.6  PROT 6.0*  ALBUMIN 1.8*     Lipase  No results found for: LIPASE related   Medications: . enoxaparin (LOVENOX) injection  40 mg Subcutaneous QHS    Assessment/Plan Sacral decubitus Incision and debridement of sacral stage IV decubitus (15x20x3cm including subcutaneous, fascia, muscle and periosteum) and left upper posterior thigh full thickness wound (3x2x2cm, subcutaneous)03/16/17, Dr. Jens Som 03/16/17 we are asked to evaluate the abdominal wall  Osteomyelitis -probable by CT and on exam.  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  The upper portion has some necrosis and a little bit of soupy drainage at its base but I think with some hydrotherapy and continue dressing changes this should clean up.  I would not wholy attribute her sepsis to this area. Her granddaughter is there with her and she notes that  patient is a full code including CPR.  I will order wet-to-dry dressings and hydrotherapy for the open buttocks wound.      LOS: 1 day    Galileah Piggee 04/09/2017 [phone]

## 2017-04-09 NOTE — ED Notes (Signed)
Bladder scan showed <912 mL. Paged MD.

## 2017-04-09 NOTE — ED Notes (Signed)
Dr. Ree Kida about temp increase after unit, Dr said no issue

## 2017-04-09 NOTE — ED Notes (Signed)
Patient has wound on left side of hip and bottom area. Wound care at bedside.

## 2017-04-09 NOTE — ED Notes (Signed)
Bed: WA09 Expected date:  Expected time:  Means of arrival:  Comments: Hold for rm 7

## 2017-04-09 NOTE — Progress Notes (Signed)
PROGRESS NOTE  Julia Floyd  [facility]:[id number] DOB: 10-Sep-1930 DOA: 04/08/2017 PCP: Josetta Huddle, MD   Brief Narrative: Julia Floyd is an 84 y.o. female with a history of CVA/ICH s/p TPA with residual deficits, HTN, T2DM and sacral decubitus ulcer requiring debridement followed by hydrotherapy recently discharged 03/30/2017 to SNF who returned from SNF for fever and AMS, having decreased per oral intake. On arrival temperature was 99.35F, BP borderline hypotensive with improvement following IVF's. Lactate normal and hemoglobin down from recent discharge (7.4) at 6.5. CT of the head on admission shows no acute intracranial process there is moderate chronic small vessel ischemic disease and multifocal small old infarcts corresponding to hemorrhage on a prior CT.  Chest x-ray showed no active cardiopulmonary disease.  Two-view pelvic: Was nondiagnostic for sacral osteomyelitis secondary to overlapping calcified fibroids in bowel  gas.  There were no acute findings. IV fluids, vancomycin and zosyn were given empirically for osteomyelitis. Cultures drawn and 2u PRBCs provided. Admission to SDU requested, though the patient has remained in the ED due to limited bed availability.   Assessment & Plan: Principal Problem:   Sepsis associated hypotension (HCC) Active Problems:   Gait disturbance, post-stroke   Dysphagia   Alzheimer's dementia   Skin ulcer of sacrum with necrosis of muscle (HCC)   Sacral osteomyelitis (HCC)   Anemia of chronic disease  Sepsis likely due to osteomyelitis of sacrum: Lactate reassuring. - Monitor blood cultures - Continue IV abx.  - Still requires SDU-level monitoring.   Unstageable sacral pressure ulcer with osteomyelitis: s/p debridement 12/17 at recent admission. - Appreciate general surgery recommendations.  - Consult wound care, PT for hydrotherapy.   Normocytic anemia of chronic disease: Ferritin 1,477.   - Monitor CBC after 2u PRBCs given.    Thrombocytopenia: Resolved, likely due to linezolid. - Monitor CBC.   Critical limb ischemia, PAD, complicated by right heel pressure wound: [name] 0.37 (R) and 0.28 (L).  - Consulted vascular surgery, Dr. Bridgett Larsson, during last admission 12/28, felt conservative measures appropriate.  - Wound care per WOC.   History of T2DM: (last was 5.1%).  - Hold metformin  Hypertension: - Hold norvasc due to hypotension  History of CVA/ICH with dysphagia: Residual deficits have left her bedbound/wheelchair bound. - Dysphagia 2 diet  DVT prophylaxis: Lovenox Code Status: Full Family Communication: Granddaughter at bedside Disposition Plan: To SDU when bed available Consultants: General surgery  Procedures:   None  Antimicrobials:  Vanc/zosyn 1/9 >>    Subjective: Tired but denies pain. Not eating. Granddaughter states she's lethargic compared to baseline.   Objective: Vitals:   04/09/17 1540 04/09/17 1541 04/09/17 1600 04/09/17 1700  BP:  (!) 125/59 (!) 126/52 (!) 122/50  Pulse:  100 95 92  Resp:  20 19 18   Temp: (!) 100.4 F (38 C)     TempSrc: Rectal     SpO2:  100% 100% 99%  Weight:      Height:        Intake/Output Summary (Last 24 hours) at 04/09/2017 1720 Last data filed at 04/09/2017 1650 Gross per 24 hour  Intake 2765 ml  Output -  Net 2765 ml   Filed Weights   04/08/17 1821  Weight: 78.5 kg (173 lb)   Gen: Elderly female in no distress, tired-appearing Pulm: Non-labored breathing. Clear to auscultation bilaterally.  CV: Regular rate and rhythm. No murmur, rub, or gallop. No JVD, no pedal edema. GI: Abdomen soft, non-tender, non-distended, with normoactive bowel sounds. No organomegaly or  masses felt. Ext: Dry calcaneal ulcers bilaterally without fluctuance or purulence.  Skin: Sacral ulcer as pictured in general surgery note, some wet slough on superior wound, not contaminated.  Neuro: Confused Psych: Judgement and insight appear impaired. Mood & affect  appropriate.   Data Reviewed: I have personally reviewed following labs and imaging studies  CBC: Recent Labs  Lab 04/08/17 1900 04/09/17 0527  WBC 9.9 11.4*  NEUTROABS 6.6  --   HGB 6.5* 8.7*  HCT 19.9* 26.0*  MCV 80.2 82.0  PLT 235 962   Basic Metabolic Panel: Recent Labs  Lab 04/08/17 1900 04/09/17 0527  NA 134* 136  K 3.7 3.6  CL 104 108  CO2 24 21*  GLUCOSE 129* 115*  BUN 21* 20  CREATININE 0.85 0.72  CALCIUM 8.2* 7.7*   GFR: Estimated Creatinine Clearance: 55.4 mL/min (by C-G formula based on SCr of 0.72 mg/dL). Liver Function Tests: Recent Labs  Lab 04/08/17 1900  AST 45*  ALT 12*  ALKPHOS 67  BILITOT 0.6  PROT 6.0*  ALBUMIN 1.8*   No results for input(s): LIPASE, AMYLASE in the last 168 hours. No results for input(s): AMMONIA in the last 168 hours. Coagulation Profile: Recent Labs  Lab 04/08/17 2115  INR 1.14   Cardiac Enzymes: No results for input(s): CKTOTAL, CKMB, CKMBINDEX, TROPONINI in the last 168 hours. BNP (last 3 results) No results for input(s): PROBNP in the last 8760 hours. HbA1C: No results for input(s): HGBA1C in the last 72 hours. CBG: No results for input(s): GLUCAP in the last 168 hours. Lipid Profile: No results for input(s): CHOL, HDL, LDLCALC, TRIG, CHOLHDL, LDLDIRECT in the last 72 hours. Thyroid Function Tests: No results for input(s): TSH, T4TOTAL, FREET4, T3FREE, THYROIDAB in the last 72 hours. Anemia Panel: No results for input(s): VITAMINB12, FOLATE, FERRITIN, TIBC, IRON, RETICCTPCT in the last 72 hours. Urine analysis:    Component Value Date/Time   COLORURINE YELLOW 03/21/2017 1928   APPEARANCEUR HAZY (A) 03/21/2017 1928   LABSPEC 1.018 03/21/2017 1928   PHURINE 5.0 03/21/2017 1928   GLUCOSEU NEGATIVE 03/21/2017 1928   HGBUR NEGATIVE 03/21/2017 1928   BILIRUBINUR NEGATIVE 03/21/2017 1928   KETONESUR NEGATIVE 03/21/2017 1928   PROTEINUR NEGATIVE 03/21/2017 1928   NITRITE NEGATIVE 03/21/2017 1928    LEUKOCYTESUR SMALL (A) 03/21/2017 1928   Recent Results (from the past 240 hour(s))  Blood culture (routine x 2)     Status: None (Preliminary result)   Collection Time: 04/08/17  7:54 PM  Result Value Ref Range Status   Specimen Description BLOOD RIGHT ANTECUBITAL  Final   Special Requests   Final    BOTTLES DRAWN AEROBIC AND ANAEROBIC Blood Culture adequate volume   Culture   Final    NO GROWTH < 24 HOURS Performed at [facility], [place] [address], [place], [place] [address]    Report Status PENDING  Incomplete  Blood culture (routine x 2)     Status: None (Preliminary result)   Collection Time: 04/08/17  7:56 PM  Result Value Ref Range Status   Specimen Description BLOOD LEFT ARM  Final   Special Requests   Final    BOTTLES DRAWN AEROBIC AND ANAEROBIC Blood Culture results may not be optimal due to an inadequate volume of blood received in culture bottles   Culture   Final    NO GROWTH < 24 HOURS Performed at [facility], [place] [address] [place],  [address]    Report Status PENDING  Incomplete      Radiology Studies: Dg Chest 2 View  Result Date: 04/08/2017 CLINICAL DATA:  Fever and lethargy. EXAM: CHEST  2 VIEW COMPARISON:  03/21/2017 FINDINGS: Cardiomediastinal silhouette is normal. Mediastinal contours appear intact. Calcific atherosclerotic disease and tortuosity of the aorta. There is no evidence of focal airspace consolidation, pleural effusion or pneumothorax. Osseous structures are without acute abnormality. Soft tissues are grossly normal. IMPRESSION: No active cardiopulmonary disease. Electronically Signed   By: Fidela Salisbury M.D.   On: 04/08/2017 19:53   Dg Pelvis 1-2 Views  Result Date: 04/08/2017 CLINICAL DATA:  Pelvic pain.  Question osteomyelitis. EXAM: PELVIS - 1-2 VIEW COMPARISON:  03/13/2017 abdomen and pelvis CT. FINDINGS: Detection of osteomyelitis is not possible due to degree of calcified fibroids. Osteopenia, and bowel gas. There was  a large ulcer over the left lower sacrum on previous CT. No evidence of fracture or dislocation. IMPRESSION: Nondiagnostic for sacral osteomyelitis due to overlapping calcified fibroids and bowel gas. No acute finding. Electronically Signed   By: Monte Fantasia M.D.   On: 04/08/2017 20:57   Ct Head Wo Contrast  Result Date: 04/08/2017 CLINICAL DATA:  Altered level consciousness. History of hypertension, dementia, stroke and LEFT-sided deficits. EXAM: CT HEAD WITHOUT CONTRAST TECHNIQUE: Contiguous axial images were obtained from the base of the skull through the vertex without intravenous contrast. COMPARISON:  MRI of the head January 11, 2017 and CT HEAD October 09, 2016. FINDINGS: BRAIN: No intraparenchymal hemorrhage, mass effect nor midline shift. The ventricles and sulci are normal for age. Patchy supratentorial white matter hypodensities, with multifocal subcortical supra and infratentorial infarcts at site of prior hemorrhage. No acute large vascular territory infarcts. No abnormal extra-axial fluid collections. Basal cisterns are patent. VASCULAR: Moderate calcific atherosclerosis of the carotid siphons. SKULL: No skull fracture. No significant scalp soft tissue swelling. SINUSES/ORBITS: LEFT maxillary mucosal retention cyst. No paranasal sinus air-fluid levels. Mastoid air cells are well aerated. Included ocular globes and orbital contents are non-suspicious. OTHER: None. IMPRESSION: 1. No acute intracranial process. 2. Moderate chronic small vessel ischemic disease. Multifocal small old infarcts corresponding to hemorrhage on prior CT. Electronically Signed   By: Elon Alas M.D.   On: 04/08/2017 19:50    Scheduled Meds: . collagenase   Topical BID  . enoxaparin (LOVENOX) injection  40 mg Subcutaneous QHS   Continuous Infusions: . piperacillin-tazobactam (ZOSYN)  IV Stopped (04/09/17 1650)  . vancomycin Stopped (04/09/17 0850)     LOS: 1 day   Time spent: 25 minutes.  Vance Gather,  MD [facility] Pager [phone]  If 7PM-7AM, please contact night-coverage [email or web address] Password [email or web address] 04/09/2017, 5:20 PM

## 2017-04-09 NOTE — ED Notes (Signed)
ED TO INPATIENT HANDOFF REPORT  Name/Age/Gender Julia Floyd A Dannenberg 84 y.o. female  Code Status    Code Status Orders  (From admission, onward)        Start     Ordered   04/08/17 2115  Full code  Continuous     04/08/17 2118    Code Status History    Date Active Date Inactive Code Status Order ID Comments User Context   03/14/2017 02:16 03/30/2017 16:29 Full Code [id number]  Rise Patience, MD Inpatient   10/14/2016 19:10 10/29/2016 20:19 Full Code [id number]  Cathlyn Parsons, PA-C Inpatient   10/14/2016 19:09 10/14/2016 19:10 Full Code [id number]  Cathlyn Parsons, PA-C Inpatient   10/10/2016 11:10 10/14/2016 18:50 Full Code [id number]  Charm Rings, NP Inpatient   10/07/2016 21:01 10/10/2016 11:10 DNR [id number]  Greta Doom, MD Inpatient    Advance Directive Documentation     Most Recent Value  Type of Advance Directive  Living will, Healthcare Power of Attorney  Pre-existing out of facility DNR order (yellow form or pink MOST form)  No data  "MOST" Form in Place?  No data      Home/SNF/Other Rehab  Chief Complaint Altered Mental Status  Level of Care/Admitting Diagnosis ED Disposition    ED Disposition Condition [facility] Area: [place] [100102]  Level of Care: Stepdown [14]  Admit to SDU based on following criteria: Hemodynamic compromise or significant risk of instability:  Patient requiring short term acute titration and management of vasoactive drips, and invasive monitoring (i.e., CVP and Arterial line).  Diagnosis: Sepsis associated hypotension [facility]) [[id number]]  Admitting Physician: Doreatha Massed  Attending Physician: Etta Quill [phone]  Estimated length of stay: past midnight tomorrow  Certification:: I certify this patient will need inpatient services for at least 2 midnights  PT Class (Do Not Modify): Inpatient [101]  PT Acc Code (Do Not Modify): Private [1]       Medical  History Past Medical History:  Diagnosis Date  . DDD (degenerative disc disease)   . Depression   . Diabetes ([place])   . HTN (hypertension)   . Psychotic disorder ([place])    secondary to the below factors. 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 Bipolar disorder, most likely type 3, this episode being manic  . Sixth cranial nerve palsy   . Stroke ([place])   . Trigeminal neuralgia   . Uterine fibroid     Allergies No Known Allergies  IV Location/Drains/Wounds Patient Lines/Drains/Airways Status   Active Line/Drains/Airways    Name:   Placement date:   Placement time:   Site:   Days:   Peripheral IV 04/08/17 Left Wrist   04/08/17    1815    Wrist   1   Peripheral IV 04/08/17 Right Antecubital   04/08/17    2019    Antecubital   1   External Urinary Catheter   03/14/17    0205    -   26   Incision (Closed) 03/16/17 Sacrum   03/16/17    1622     24   Pressure Injury Stage II -  Partial thickness loss of dermis presenting as a shallow open ulcer with a red, pink wound bed without slough.   -    -        Pressure Injury 03/17/17 Unstageable - Full thickness tissue loss in which the base of the ulcer is covered by  slough (yellow, tan, gray, green or brown) and/or eschar (tan, brown or black) in the wound bed. *PT*   03/17/17    1340     23   Pressure Injury 03/17/17 Stage IV - Full thickness tissue loss with exposed bone, tendon or muscle. *PT*  S/P I&D 03/16/17   03/17/17    1340     23   Pressure Injury 03/23/17 Deep Tissue Injury - Purple or maroon localized area of discolored intact skin or blood-filled blister due to damage of underlying soft tissue from pressure and/or shear.   03/23/17    -     17   Wound / Incision (Open or Dehisced) 03/14/17 Non-pressure wound Foot Right;Anterior unstageable- covered with eschar   03/14/17    0207    Foot   26   Wound / Incision (Open or Dehisced) 03/14/17 Non-pressure wound Heel Right unstageable- covered with eschar   03/14/17    0211     Heel   26          Labs/Imaging Results for orders placed or performed during the hospital encounter of 04/08/17 (from the past 48 hour(s))  Comprehensive metabolic panel     Status: Abnormal   Collection Time: 04/08/17  7:00 PM  Result Value Ref Range   Sodium 134 (L) 135 - 145 mmol/L   Potassium 3.7 3.5 - 5.1 mmol/L   Chloride 104 101 - 111 mmol/L   CO2 24 22 - 32 mmol/L   Glucose, Bld 129 (H) 65 - 99 mg/dL   BUN 21 (H) 6 - 20 mg/dL   Creatinine, Ser 0.85 0.44 - 1.00 mg/dL   Calcium 8.2 (L) 8.9 - 10.3 mg/dL   Total Protein 6.0 (L) 6.5 - 8.1 g/dL   Albumin 1.8 (L) 3.5 - 5.0 g/dL   AST 45 (H) 15 - 41 U/L   ALT 12 (L) 14 - 54 U/L   Alkaline Phosphatase 67 38 - 126 U/L   Total Bilirubin 0.6 0.3 - 1.2 mg/dL   GFR calc non Af Amer >60 >60 mL/min   GFR calc Af Amer >60 >60 mL/min    Comment: (NOTE) The eGFR has been calculated using the CKD EPI equation. This calculation has not been validated in all clinical situations. eGFR's persistently <60 mL/min signify possible Chronic Kidney Disease.    Anion gap 6 5 - 15  CBC with Differential/Platelet     Status: Abnormal   Collection Time: 04/08/17  7:00 PM  Result Value Ref Range   WBC 9.9 4.0 - 10.5 K/uL   RBC 2.48 (L) 3.87 - 5.11 MIL/uL   Hemoglobin 6.5 (LL) 12.0 - 15.0 g/dL    Comment: REPEATED TO VERIFY CRITICAL RESULT CALLED TO, READ BACK BY AND VERIFIED WITH: OXEMDINE,J. RN '@1920'$  ON 01.09.19 BY COHEN,K    HCT 19.9 (L) 36.0 - 46.0 %   MCV 80.2 78.0 - 100.0 fL   MCH 26.2 26.0 - 34.0 pg   MCHC 32.7 30.0 - 36.0 g/dL   RDW 17.2 (H) 11.5 - 15.5 %   Platelets 235 150 - 400 K/uL   Neutrophils Relative % 66 %   Neutro Abs 6.6 1.7 - 7.7 K/uL   Lymphocytes Relative 18 %   Lymphs Abs 1.8 0.7 - 4.0 K/uL   Monocytes Relative 16 %   Monocytes Absolute 1.5 (H) 0.1 - 1.0 K/uL   Eosinophils Relative 0 %   Eosinophils Absolute 0.0 0.0 - 0.7 K/uL   Basophils Relative 0 %  Basophils Absolute 0.0 0.0 - 0.1 K/uL  I-Stat CG4 Lactic  Acid, ED     Status: None   Collection Time: 04/08/17  7:09 PM  Result Value Ref Range   Lactic Acid, Venous 1.01 0.5 - 1.9 mmol/L  Blood culture (routine x 2)     Status: None (Preliminary result)   Collection Time: 04/08/17  7:54 PM  Result Value Ref Range   Specimen Description BLOOD RIGHT ANTECUBITAL    Special Requests      BOTTLES DRAWN AEROBIC AND ANAEROBIC Blood Culture adequate volume   Culture      NO GROWTH < 24 HOURS Performed at [facility] [address] [place], [place] [address]    Report Status PENDING   Blood culture (routine x 2)     Status: None (Preliminary result)   Collection Time: 04/08/17  7:56 PM  Result Value Ref Range   Specimen Description BLOOD LEFT ARM    Special Requests      BOTTLES DRAWN AEROBIC AND ANAEROBIC Blood Culture results may not be optimal due to an inadequate volume of blood received in culture bottles   Culture      NO GROWTH < 24 HOURS Performed at [facility] [address], [place], [place] [address]    Report Status PENDING   Type and screen [place]     Status: None (Preliminary result)   Collection Time: 04/08/17  7:56 PM  Result Value Ref Range   ABO/RH(D) B POS    Antibody Screen NEG    Sample Expiration 04/11/2017    Unit Number [id number]    Blood Component Type RED CELLS,LR    Unit division 00    Status of Unit ISSUED,FINAL    Transfusion Status OK TO TRANSFUSE    Crossmatch Result Compatible    Unit Number [id number]    Blood Component Type RBC LR PHER1    Unit division 00    Status of Unit ISSUED    Transfusion Status OK TO TRANSFUSE    Crossmatch Result Compatible   Lactic acid, plasma     Status: None   Collection Time: 04/08/17  9:15 PM  Result Value Ref Range   Lactic Acid, Venous 1.1 0.5 - 1.9 mmol/L  Protime-INR     Status: None   Collection Time: 04/08/17  9:15 PM  Result Value Ref Range   Prothrombin Time 14.6 11.4 - 15.2 seconds   INR 1.14   APTT      Status: Abnormal   Collection Time: 04/08/17  9:15 PM  Result Value Ref Range   aPTT 37 (H) 24 - 36 seconds    Comment:        IF BASELINE aPTT IS ELEVATED, SUGGEST PATIENT RISK ASSESSMENT BE USED TO DETERMINE APPROPRIATE ANTICOAGULANT THERAPY.   Procalcitonin     Status: None   Collection Time: 04/08/17  9:28 PM  Result Value Ref Range   Procalcitonin 0.81 ng/mL    Comment:        Interpretation: PCT > 0.5 ng/mL and <= 2 ng/mL: Systemic infection (sepsis) is possible, but other conditions are known to elevate PCT as well. (NOTE)       Sepsis PCT Algorithm           Lower Respiratory Tract  Infection PCT Algorithm    ----------------------------     ----------------------------         PCT < 0.25 ng/mL                PCT < 0.10 ng/mL         Strongly encourage             Strongly discourage   discontinuation of antibiotics    initiation of antibiotics    ----------------------------     -----------------------------       PCT 0.25 - 0.50 ng/mL            PCT 0.10 - 0.25 ng/mL               OR       >80% decrease in PCT            Discourage initiation of                                            antibiotics      Encourage discontinuation           of antibiotics    ----------------------------     -----------------------------         PCT >= 0.50 ng/mL              PCT 0.26 - 0.50 ng/mL                AND       <80% decrease in PCT             Encourage initiation of                                             antibiotics       Encourage continuation           of antibiotics    ----------------------------     -----------------------------        PCT >= 0.50 ng/mL                  PCT > 0.50 ng/mL               AND         increase in PCT                  Strongly encourage                                      initiation of antibiotics    Strongly encourage escalation           of antibiotics                                      -----------------------------                                           PCT <= 0.25 ng/mL  OR                                        > 80% decrease in PCT                                     Discontinue / Do not initiate                                             antibiotics   I-Stat CG4 Lactic Acid, ED     Status: None   Collection Time: 04/08/17  9:29 PM  Result Value Ref Range   Lactic Acid, Venous 1.06 0.5 - 1.9 mmol/L  Prepare RBC     Status: None   Collection Time: 04/08/17  9:30 PM  Result Value Ref Range   Order Confirmation ORDER PROCESSED BY BLOOD BANK   Lactic acid, plasma     Status: None   Collection Time: 04/09/17  5:27 AM  Result Value Ref Range   Lactic Acid, Venous 1.0 0.5 - 1.9 mmol/L  CBC     Status: Abnormal   Collection Time: 04/09/17  5:27 AM  Result Value Ref Range   WBC 11.4 (H) 4.0 - 10.5 K/uL   RBC 3.17 (L) 3.87 - 5.11 MIL/uL   Hemoglobin 8.7 (L) 12.0 - 15.0 g/dL    Comment: DELTA CHECK NOTED POST TRANSFUSION SPECIMEN    HCT 26.0 (L) 36.0 - 46.0 %   MCV 82.0 78.0 - 100.0 fL   MCH 27.4 26.0 - 34.0 pg   MCHC 33.5 30.0 - 36.0 g/dL   RDW 16.3 (H) 11.5 - 15.5 %   Platelets 206 150 - 400 K/uL  Basic metabolic panel     Status: Abnormal   Collection Time: 04/09/17  5:27 AM  Result Value Ref Range   Sodium 136 135 - 145 mmol/L   Potassium 3.6 3.5 - 5.1 mmol/L   Chloride 108 101 - 111 mmol/L   CO2 21 (L) 22 - 32 mmol/L   Glucose, Bld 115 (H) 65 - 99 mg/dL   BUN 20 6 - 20 mg/dL   Creatinine, Ser 0.72 0.44 - 1.00 mg/dL   Calcium 7.7 (L) 8.9 - 10.3 mg/dL   GFR calc non Af Amer >60 >60 mL/min   GFR calc Af Amer >60 >60 mL/min    Comment: (NOTE) The eGFR has been calculated using the CKD EPI equation. This calculation has not been validated in all clinical situations. eGFR's persistently <60 mL/min signify possible Chronic Kidney Disease.    Anion gap 7 5 - 15   Dg Chest 2 View  Result Date:  04/08/2017 CLINICAL DATA:  Fever and lethargy. EXAM: CHEST  2 VIEW COMPARISON:  03/21/2017 FINDINGS: Cardiomediastinal silhouette is normal. Mediastinal contours appear intact. Calcific atherosclerotic disease and tortuosity of the aorta. There is no evidence of focal airspace consolidation, pleural effusion or pneumothorax. Osseous structures are without acute abnormality. Soft tissues are grossly normal. IMPRESSION: No active cardiopulmonary disease. Electronically Signed   By: Fidela Salisbury M.D.   On: 04/08/2017 19:53   Dg Pelvis 1-2 Views  Result Date: 04/08/2017 CLINICAL DATA:  Pelvic pain.  Question osteomyelitis. EXAM: PELVIS -  1-2 VIEW COMPARISON:  03/13/2017 abdomen and pelvis CT. FINDINGS: Detection of osteomyelitis is not possible due to degree of calcified fibroids. Osteopenia, and bowel gas. There was a large ulcer over the left lower sacrum on previous CT. No evidence of fracture or dislocation. IMPRESSION: Nondiagnostic for sacral osteomyelitis due to overlapping calcified fibroids and bowel gas. No acute finding. Electronically Signed   By: Monte Fantasia M.D.   On: 04/08/2017 20:57   Ct Head Wo Contrast  Result Date: 04/08/2017 CLINICAL DATA:  Altered level consciousness. History of hypertension, dementia, stroke and LEFT-sided deficits. EXAM: CT HEAD WITHOUT CONTRAST TECHNIQUE: Contiguous axial images were obtained from the base of the skull through the vertex without intravenous contrast. COMPARISON:  MRI of the head January 11, 2017 and CT HEAD October 09, 2016. FINDINGS: BRAIN: No intraparenchymal hemorrhage, mass effect nor midline shift. The ventricles and sulci are normal for age. Patchy supratentorial white matter hypodensities, with multifocal subcortical supra and infratentorial infarcts at site of prior hemorrhage. No acute large vascular territory infarcts. No abnormal extra-axial fluid collections. Basal cisterns are patent. VASCULAR: Moderate calcific atherosclerosis of the  carotid siphons. SKULL: No skull fracture. No significant scalp soft tissue swelling. SINUSES/ORBITS: LEFT maxillary mucosal retention cyst. No paranasal sinus air-fluid levels. Mastoid air cells are well aerated. Included ocular globes and orbital contents are non-suspicious. OTHER: None. IMPRESSION: 1. No acute intracranial process. 2. Moderate chronic small vessel ischemic disease. Multifocal small old infarcts corresponding to hemorrhage on prior CT. Electronically Signed   By: Elon Alas M.D.   On: 04/08/2017 19:50    Pending Labs Unresulted Labs (From admission, onward)   Start     Ordered   04/10/17 0500  Creatinine, serum  Daily,   R     04/08/17 2128   04/10/17 0500  CBC  Tomorrow morning,   R     04/09/17 1739   04/10/17 2774  Basic metabolic panel  Tomorrow morning,   R     04/09/17 1739   04/09/17 1740  Urinalysis, Routine w reflex microscopic  Once,   R     04/09/17 1739   04/09/17 1740  Culture, Urine  Once,   R     04/09/17 1739   04/08/17 2108  Urinalysis, Routine w reflex microscopic  STAT,   R     04/08/17 2107      Vitals/Pain Today's Vitals   04/09/17 2130 04/09/17 2200 04/09/17 2300 04/09/17 2333  BP: (!) 112/54 (!) 116/50 (!) 102/49   Pulse: 95 95 91   Resp: 16  16   Temp:      TempSrc:      SpO2: 100% 100% 100% 100%  Weight:      Height:      PainSc:        Isolation Precautions No active isolations  Medications Medications  acetaminophen (TYLENOL) tablet 650 mg ( Oral See Alternative 04/09/17 1427)    Or  acetaminophen (TYLENOL) suppository 650 mg (650 mg Rectal Given 04/09/17 1427)  ondansetron (ZOFRAN) tablet 4 mg (not administered)    Or  ondansetron (ZOFRAN) injection 4 mg (not administered)  enoxaparin (LOVENOX) injection 40 mg (0 mg Subcutaneous Hold 04/09/17 2245)  vancomycin (VANCOCIN) IVPB 1000 mg/200 mL premix (0 mg Intravenous Stopped 04/09/17 0850)  piperacillin-tazobactam (ZOSYN) IVPB 3.375 g (3.375 g Intravenous New Bag/Given  04/09/17 2029)  collagenase (SANTYL) ointment ( Topical Given 04/09/17 1534)  sodium chloride 0.9 % bolus 1,000 mL (0 mLs Intravenous Stopped 04/08/17  2213)  vancomycin (VANCOCIN) IVPB 1000 mg/200 mL premix (0 mg Intravenous Stopped 04/08/17 2242)  piperacillin-tazobactam (ZOSYN) IVPB 3.375 g (0 g Intravenous Stopped 04/08/17 2156)  0.9 %  sodium chloride infusion ( Intravenous Restarted 04/09/17 0516)    Mobility non-ambulatory

## 2017-04-10 ENCOUNTER — Encounter (HOSPITAL_COMMUNITY): Payer: Self-pay

## 2017-04-10 LAB — BPAM RBC
BLOOD PRODUCT EXPIRATION DATE: 201901142359
BLOOD PRODUCT EXPIRATION DATE: 201902022359
ISSUE DATE / TIME: 201901100213
ISSUE DATE / TIME: 201901092231
UNIT TYPE AND RH: 7300
Unit Type and Rh: 1700

## 2017-04-10 LAB — URINALYSIS, ROUTINE W REFLEX MICROSCOPIC
Bilirubin Urine: NEGATIVE
Glucose, UA: NEGATIVE mg/dL
HGB URINE DIPSTICK: NEGATIVE
Ketones, ur: NEGATIVE mg/dL
Leukocytes, UA: NEGATIVE
Nitrite: NEGATIVE
Protein, ur: NEGATIVE mg/dL
SPECIFIC GRAVITY, URINE: 1.015 (ref 1.005–1.030)
pH: 5 (ref 5.0–8.0)

## 2017-04-10 LAB — TYPE AND SCREEN
ABO/RH(D): B POS
Antibody Screen: NEGATIVE
Unit division: 0
Unit division: 0

## 2017-04-10 LAB — BASIC METABOLIC PANEL
Anion gap: 7 (ref 5–15)
BUN: 20 mg/dL (ref 6–20)
CHLORIDE: 108 mmol/L (ref 101–111)
CO2: 21 mmol/L — AB (ref 22–32)
Calcium: 8 mg/dL — ABNORMAL LOW (ref 8.9–10.3)
Creatinine, Ser: 0.81 mg/dL (ref 0.44–1.00)
GFR calc Af Amer: >60 mL/min (ref >60)
GFR calc non Af Amer: >60 mL/min (ref >60)
GLUCOSE: 97 mg/dL (ref 65–99)
POTASSIUM: 3.6 mmol/L (ref 3.5–5.1)
SODIUM: 136 mmol/L (ref 135–145)

## 2017-04-10 LAB — MRSA PCR SCREENING: MRSA BY PCR: NEGATIVE

## 2017-04-10 LAB — CBC
HCT: 27.8 % — ABNORMAL LOW (ref 36.0–46.0)
HEMOGLOBIN: 9.2 g/dL — AB (ref 12.0–15.0)
MCH: 27 pg (ref 26.0–34.0)
MCHC: 33.1 g/dL (ref 30.0–36.0)
MCV: 81.5 fL (ref 78.0–100.0)
Platelets: 232 10*3/uL (ref 150–400)
RBC: 3.41 MIL/uL — AB (ref 3.87–5.11)
RDW: 17 % — ABNORMAL HIGH (ref 11.5–15.5)
WBC: 12 10*3/uL — ABNORMAL HIGH (ref 4.0–10.5)

## 2017-04-10 MED ORDER — JUVEN PO PACK
1.0000 | PACK | Freq: Two times a day (BID) | ORAL | Status: DC
  Administered 2017-04-10 – 2017-04-22 (×11): 1 via ORAL
  Filled 2017-04-10 (×25): qty 1

## 2017-04-10 MED ORDER — ENSURE ENLIVE PO LIQD
237.0000 mL | Freq: Three times a day (TID) | ORAL | Status: DC
  Administered 2017-04-10 – 2017-04-12 (×4): 237 mL via ORAL

## 2017-04-10 MED ORDER — ADULT MULTIVITAMIN W/MINERALS CH
1.0000 | ORAL_TABLET | Freq: Every day | ORAL | Status: DC
  Administered 2017-04-10 – 2017-04-22 (×10): 1 via ORAL
  Filled 2017-04-10 (×12): qty 1

## 2017-04-10 NOTE — Progress Notes (Signed)
Initial Nutrition Assessment  DOCUMENTATION CODES:   Not applicable  INTERVENTION:  - Will order Ensure Enlive po BID, each supplement provides 350 kcal and 20 grams of protein - Will order Juven BID, each packet provides 80 kcal and 14 grams of amino acids. - Will order daily multivitamin with minerals. - Continue to encourage PO intakes.  NUTRITION DIAGNOSIS:   Increased nutrient needs related to wound healing as evidenced by estimated needs.  GOAL:   Patient will meet greater than or equal to 90% of their needs  MONITOR:   PO intake, Supplement acceptance, Weight trends, Labs, Skin  REASON FOR ASSESSMENT:   Malnutrition Screening Tool  ASSESSMENT:   84 y.o. female with a history of CVA/ICH s/p TPA with residual deficits, HTN, T2DM and sacral decubitus ulcer requiring debridement followed by hydrotherapy recently discharged 03/30/2017 to SNF who returned from SNF for fever and AMS, having decreased per oral intake.   BMI indicates overweight (appropriate for age). No intakes documented since admission. Pt very drowsy at time of RD visit and was unable to keep eyes open, did not provide any responses. No family/visitors present at this time.   Pt was last seen by an RD on 03/25/17. At that time, pt reported having a "fair" appetite and was eating an average of 25% at meals. Based on documentation from that time, it appears pt has had worsening in wounds.   Medications reviewed. Labs reviewed; Ca: 8 mg/dL.       NUTRITION - FOCUSED PHYSICAL EXAM:    Most Recent Value  Orbital Region  No depletion  Upper Arm Region  Mild depletion  Thoracic and Lumbar Region  Unable to assess  Buccal Region  No depletion  Temple Region  No depletion  Clavicle Bone Region  No depletion  Clavicle and Acromion Bone Region  No depletion  Scapular Bone Region  Mild depletion  Dorsal Hand  No depletion  Patellar Region  No depletion  Anterior Thigh Region  No depletion  Posterior  Calf Region  No depletion  Edema (RD Assessment)  Mild  Hair  Reviewed  Eyes  Unable to assess  Mouth  Unable to assess  Skin  Reviewed  Nails  Reviewed       Diet Order:  DIET DYS 2 Room service appropriate? Yes; Fluid consistency: Thin  EDUCATION NEEDS:   No education needs have been identified at this time  Skin:  Skin Integrity Issues:: Stage IV, Stage II, Unstageable, DTI, Other (Comment) DTI: L heel Stage II: L buttocks Stage IV: sacrum Unstageable: (full thickness) bilateral feet Other: non-pressure injuries to L leg, R heel, R foot  Last BM:  PTA/unknown  Height:   Ht Readings from Last 1 Encounters:  04/10/17 5\' 6"  (1.676 m)    Weight:   Wt Readings from Last 1 Encounters:  04/08/17 173 lb (78.5 kg)    Ideal Body Weight:  59.09 kg  BMI:  Body mass index is 27.92 kg/m.  Estimated Nutritional Needs:   Kcal:  2200-2355 (28-30 kcal/kg)  Protein:  118-133 grams (1.5-1.7 grams/kg)  Fluid:  >/= 2.2 L/day     Jarome Matin, MS, RD, LDN, [facility] Inpatient Clinical Dietitian Pager # [phone] After hours/weekend pager # [phone]

## 2017-04-10 NOTE — Plan of Care (Signed)
  Nutrition: Adequate nutrition will be maintained 04/10/2017 1353 - Progressing by Dorene Sorrow, RN   Elimination: Will not experience complications related to bowel motility 04/10/2017 1353 - Progressing by Dorene Sorrow, RN   Pain Managment: General experience of comfort will improve 04/10/2017 1353 - Progressing by Dorene Sorrow, RN

## 2017-04-10 NOTE — Progress Notes (Signed)
04/10/17 1034 I Paged the PA [phone] once PT arrived to the floor for hydrotherapy. Per PT, PA never arrived and hydrotherapy took about 1 hour.

## 2017-04-10 NOTE — Progress Notes (Signed)
Subjective/Chief Complaint: No complains. Very tired   Objective: Vital signs in last 24 hours: Temp:  [99 F (37.2 C)-101.6 F (38.7 C)] 99.7 F (37.6 C) (01/11 0730) Pulse Rate:  [82-102] 102 (01/11 0600) Resp:  [16-24] 17 (01/11 0600) BP: (102-164)/(36-78) 133/55 (01/11 0600) SpO2:  [99 %-100 %] 100 % (01/11 0600)    Intake/Output from previous day: 01/10 0701 - 01/11 0700 In: 400 [IV Piggyback:400] Out: 1500 [Urine:1500] Intake/Output this shift: No intake/output data recorded.  General appearance: slowed mentation Resp: clear to auscultation bilaterally Cardio: regular rate and rhythm GI: soft, nontender  Lab Results:  Recent Labs    04/09/17 0527 04/10/17 0304  WBC 11.4* 12.0*  HGB 8.7* 9.2*  HCT 26.0* 27.8*  PLT 206 232   BMET Recent Labs    04/09/17 0527 04/10/17 0304  NA 136 136  K 3.6 3.6  CL 108 108  CO2 21* 21*  GLUCOSE 115* 97  BUN 20 20  CREATININE 0.72 0.81  CALCIUM 7.7* 8.0*   PT/INR Recent Labs    04/08/17 2115  LABPROT 14.6  INR 1.14   ABG No results for input(s): PHART, HCO3 in the last 72 hours.  Invalid input(s): PCO2, PO2  Studies/Results: Dg Chest 2 View  Result Date: 04/08/2017 CLINICAL DATA:  Fever and lethargy. EXAM: CHEST  2 VIEW COMPARISON:  03/21/2017 FINDINGS: Cardiomediastinal silhouette is normal. Mediastinal contours appear intact. Calcific atherosclerotic disease and tortuosity of the aorta. There is no evidence of focal airspace consolidation, pleural effusion or pneumothorax. Osseous structures are without acute abnormality. Soft tissues are grossly normal. IMPRESSION: No active cardiopulmonary disease. Electronically Signed   By: Fidela Salisbury M.D.   On: 04/08/2017 19:53   Dg Pelvis 1-2 Views  Result Date: 04/08/2017 CLINICAL DATA:  Pelvic pain.  Question osteomyelitis. EXAM: PELVIS - 1-2 VIEW COMPARISON:  03/13/2017 abdomen and pelvis CT. FINDINGS: Detection of osteomyelitis is not possible due to  degree of calcified fibroids. Osteopenia, and bowel gas. There was a large ulcer over the left lower sacrum on previous CT. No evidence of fracture or dislocation. IMPRESSION: Nondiagnostic for sacral osteomyelitis due to overlapping calcified fibroids and bowel gas. No acute finding. Electronically Signed   By: Monte Fantasia M.D.   On: 04/08/2017 20:57   Ct Head Wo Contrast  Result Date: 04/08/2017 CLINICAL DATA:  Altered level consciousness. History of hypertension, dementia, stroke and LEFT-sided deficits. EXAM: CT HEAD WITHOUT CONTRAST TECHNIQUE: Contiguous axial images were obtained from the base of the skull through the vertex without intravenous contrast. COMPARISON:  MRI of the head January 11, 2017 and CT HEAD October 09, 2016. FINDINGS: BRAIN: No intraparenchymal hemorrhage, mass effect nor midline shift. The ventricles and sulci are normal for age. Patchy supratentorial white matter hypodensities, with multifocal subcortical supra and infratentorial infarcts at site of prior hemorrhage. No acute large vascular territory infarcts. No abnormal extra-axial fluid collections. Basal cisterns are patent. VASCULAR: Moderate calcific atherosclerosis of the carotid siphons. SKULL: No skull fracture. No significant scalp soft tissue swelling. SINUSES/ORBITS: LEFT maxillary mucosal retention cyst. No paranasal sinus air-fluid levels. Mastoid air cells are well aerated. Included ocular globes and orbital contents are non-suspicious. OTHER: None. IMPRESSION: 1. No acute intracranial process. 2. Moderate chronic small vessel ischemic disease. Multifocal small old infarcts corresponding to hemorrhage on prior CT. Electronically Signed   By: Elon Alas M.D.   On: 04/08/2017 19:50    Anti-infectives: Anti-infectives (From admission, onward)   Start     Dose/Rate  Route Frequency Ordered Stop   04/09/17 0800  vancomycin (VANCOCIN) IVPB 1000 mg/200 mL premix     1,000 mg 200 mL/hr over 60 Minutes Intravenous  Every 24 hours 04/08/17 2128     04/09/17 0400  piperacillin-tazobactam (ZOSYN) IVPB 3.375 g     3.375 g 12.5 mL/hr over 240 Minutes Intravenous Every 8 hours 04/08/17 2128     04/08/17 2115  vancomycin (VANCOCIN) IVPB 1000 mg/200 mL premix     1,000 mg 200 mL/hr over 60 Minutes Intravenous  Once 04/08/17 2104 04/08/17 2242   04/08/17 2115  piperacillin-tazobactam (ZOSYN) IVPB 3.375 g     3.375 g 100 mL/hr over 30 Minutes Intravenous  Once 04/08/17 2104 04/08/17 2156      Assessment/Plan: s/p * No surgery found * will examine wound during hydrotherapy.  No plan for surgery at this point  LOS: 2 days    TOTH III,Adelin Ventrella S 04/10/2017

## 2017-04-10 NOTE — Care Management Note (Signed)
Case Management Note  Patient Details  Name: ZURII HEWES MRN: [id number] Date of Birth: 1930/10/30  Subjective/Objective:                  sepsis  Action/Plan: Date: April 10, 2017 Velva Harman, BSN, [place], [place] Chart and notes review for patient progress and needs. Will follow for case management and discharge needs. Next review date: [id number] Expected Discharge Date:  (unknown)               Expected Discharge Plan:  [place]  In-House Referral:  Clinical Social Work  Discharge planning Services  CM Consult  Post Acute Care Choice:    Choice offered to:     DME Arranged:    DME Agency:     HH Arranged:    [place] Agency:     Status of Service:  In process, will continue to follow  If discussed at Long Length of Stay Meetings, dates discussed:    Additional Comments:  Leeroy Cha, RN 04/10/2017, 9:00 AM

## 2017-04-10 NOTE — Progress Notes (Signed)
HYDROTHERAPY EVALUATION   04/10/17 1300  Subjective Assessment  Subjective Pt did not speak during session. She did express discomfort with repositioning.  Patient and Family Stated Goals none stated  Date of Onset (unknown-present on last admission 03/14/17)  Prior Treatments hydrotherapy initiated 03/17/17  Evaluation and Treatment  Evaluation and Treatment Procedures Explained to Patient/Family Yes  Evaluation and Treatment Procedures Patient unable to consent due to mental status  Pressure Injury 04/10/17 Stage IV - Full thickness tissue loss with exposed bone, tendon or muscle. *PT*  S/P I&D 03/16/17  Date First Assessed/Time First Assessed: 04/10/17 1035  Location: Sacrum  Staging: Stage IV - Full thickness tissue loss with exposed bone, tendon or muscle.  Wound Description (Comments): *PT*  S/P I&D 03/16/17  Present on Admission: Yes  Dressing Type ABD;Barrier Film (skin prep);Gauze (Comment);Moist to dry (Santyl)  Dressing Changed  Dressing Change Frequency Twice a day  State of Healing Non-healing  Site / Wound Assessment Brown;Yellow;Pink;Red  % Wound base Red or Granulating 70%  % Wound base Yellow/Fibrinous Exudate 30%  Peri-wound Assessment Intact  Wound Length (cm) 15 cm  Wound Width (cm) 13 cm  Wound Depth (cm) 3 cm  Wound Surface Area (cm^2) 195 cm^2  Wound Volume (cm^3) 585 cm^3  Undermining (cm) 3 (12:00-2:00)  Margins Unattached edges (unapproximated)  Drainage Amount Moderate  Drainage Description Serosanguineous  Treatment Debridement (Selective);Hydrotherapy (Pulse lavage);Packing (Saline gauze) (Santyl)  Pressure Injury 04/10/17 Unstageable - Full thickness tissue loss in which the base of the ulcer is covered by slough (yellow, tan, gray, green or brown) and/or eschar (tan, brown or black) in the wound bed. *PT*  Date First Assessed/Time First Assessed: 03/17/17 1340   Location: Ischial tuberosity  Location Orientation: Left  Staging: Unstageable - Full  thickness tissue loss in which the base of the ulcer is covered by slough (yellow, tan, gray, green or brown...  Dressing Type ABD;Gauze (Comment);Barrier Film (skin prep);Moist to dry (Santyl)  Dressing Changed  Dressing Change Frequency Twice a day  State of Healing Early/partial granulation  Site / Wound Assessment Yellow;Pink  % Wound base Red or Granulating 90%  % Wound base Yellow/Fibrinous Exudate 10%  Peri-wound Assessment Pink  Wound Length (cm) 4 cm  Wound Width (cm) 6 cm  Wound Depth (cm) 1.5 cm  Wound Surface Area (cm^2) 24 cm^2  Wound Volume (cm^3) 36 cm^3  Margins Unattached edges (unapproximated)  Drainage Amount Minimal  Drainage Description Serosanguineous  Treatment Debridement (Selective);Hydrotherapy (Pulse lavage);Packing (Saline gauze) (Santyl)  Hydrotherapy  Pulsed Lavage with Suction (psi) 8 psi  Pulsed Lavage with Suction - Normal Saline Used 1000 mL  Pulsed Lavage Tip Tip with splash shield  Pulsed lavage therapy - wound location sacral  Selective Debridement  Selective Debridement - Location sacrum  Selective Debridement - Tools Used Forceps;Scissors  Selective Debridement - Tissue Removed brown and yellow tissue  Wound Therapy - Assess/Plan/Recommendations  Wound Therapy - Clinical Statement 84 yo female admitted from SNF with multiple wounds including sacral wound found to have osteomyelitis. S/P Incision and Drainage 03/16/17. Hx CVA-primarily bedbound.   Factors Delaying/Impairing Wound Healing Diabetes Mellitus;Incontinence;Immobility;Infection - systemic/local  Hydrotherapy Plan Debridement;Dressing change;Patient/family education;Pulsatile lavage with suction  Wound Therapy - Frequency 6X / week  Wound Therapy - Current Recommendations Case manager/social work  Wound Therapy - Follow Up Recommendations Skilled nursing facility  Wound Plan Consulted for hydrotherapy-per WOCN plan is for wound vac placement if possible. Performed hydrotherapy and  dressing change for L IT wound and sacral  wound today. Hydrotherapy for IT wound no longer needed. Will continue hydrotherapy/debridement on sacral wound only.   Wound Therapy Goals - Improve the function of patient's integumentary system by progressing the wound(s) through the phases of wound healing by:  Decrease Necrotic Tissue to 20  Decrease Necrotic Tissue - Progress Goal set today  Increase Granulation Tissue to 80  Increase Granulation Tissue - Progress Goal set today  Goals/treatment plan/discharge plan were made with and agreed upon by patient/family No, Patient unable to participate in goals/treatment/discharge plan and family unavailable  Time For Goal Achievement 2 weeks  Wound Therapy - Potential for Goals Poor   Weston Anna, MPT [phone]

## 2017-04-10 NOTE — Progress Notes (Signed)
PROGRESS NOTE  Julia Floyd  UMP:[id number] DOB: 06-28-1930 DOA: 04/08/2017 PCP: Josetta Huddle, MD   Brief Narrative: Julia Floyd is an 84 y.o. female with a history of CVA/ICH s/p TPA with residual deficits, HTN, T2DM and sacral decubitus ulcer requiring debridement followed by hydrotherapy recently discharged 03/30/2017 to SNF who returned from SNF for fever and AMS, having decreased per oral intake. On arrival temperature was 99.7F, BP borderline hypotensive with improvement following IVF's. Lactate normal and hemoglobin down from recent discharge (7.4) at 6.5. CT of the head on admission shows no acute intracranial process there is moderate chronic small vessel ischemic disease and multifocal small old infarcts corresponding to hemorrhage on a prior CT.  Chest x-ray showed no active cardiopulmonary disease.  Two-view pelvic: Was nondiagnostic for sacral osteomyelitis secondary to overlapping calcified fibroids in bowel  gas.  There were no acute findings. IV fluids, vancomycin and zosyn were given empirically for osteomyelitis. Cultures drawn and 2u PRBCs provided. Admission to SDU requested, though the patient has remained in the ED due to limited bed availability.   Assessment & Plan: Principal Problem:   Sepsis associated hypotension (HCC) Active Problems:   Gait disturbance, post-stroke   Dysphagia   Alzheimer's dementia   Skin ulcer of sacrum with necrosis of muscle (HCC)   Sacral osteomyelitis (HCC)   Anemia of chronic disease  Sepsis likely due to osteomyelitis of sacrum: Lactate reassuring. - Monitor blood cultures - Continue IV abx.  - Still requires SDU-level monitoring.   Unstageable sacral pressure ulcer with osteomyelitis: s/p debridement 12/17 at recent admission. - Appreciate general surgery recommendations: Now undergoing hydrotherapy. No need for repeat debridement. - RD consulted, nutritional supplement as ordered.  Normocytic anemia of chronic  disease: Ferritin 1,477.   - Hgb 6.5 on admission, up to 9.2 w/2u PRBCs with improved blood pressure.  Thrombocytopenia: Resolved, likely due to linezolid given at previous admission.  Critical limb ischemia, PAD, complicated by bilateral heel pressure wounds: [name] 0.37 (R) and 0.28 (L).  - Consulted vascular surgery, Dr. Bridgett Larsson, during last admission 12/28, felt conservative measures appropriate.  - Wound care per WOC.   History of T2DM: (last was 5.1%).  - Hold metformin  Hypertension: - Hold norvasc due to hypotension  History of CVA/ICH with dysphagia: Residual deficits have left her bedbound/wheelchair bound. - Dysphagia 2 diet  DVT prophylaxis: Lovenox Code Status: Full Family Communication: None at bedside Disposition Plan: Transfer to floor once hypotension improving, possibly 1/12. Consultants: General surgery  Procedures:   None  Antimicrobials:  Vanc/zosyn 1/9 >>    Subjective: Tired, denies pain, but is too weak to speak more than a word at a time.   Objective: Vitals:   04/10/17 1000 04/10/17 1100 04/10/17 1140 04/10/17 1200  BP: (!) 102/37 106/78  103/60  Pulse: 95 89  90  Resp: (!) 25 (!) 30  (!) 23  Temp:   99.2 F (37.3 C)   TempSrc:   Oral   SpO2: 97% 98%  98%  Weight:      Height:        Intake/Output Summary (Last 24 hours) at 04/10/2017 1335 Last data filed at 04/10/2017 0600 Gross per 24 hour  Intake 150 ml  Output 1500 ml  Net -1350 ml   Filed Weights   04/08/17 1821  Weight: 78.5 kg (173 lb)   Gen: Elderly female in no distress resting quietly Pulm: Non-labored tachypnea. Clear to auscultation bilaterally.  CV: Regular rate and rhythm. No murmur,  rub, or gallop. No JVD, no pedal edema. GI: Abdomen soft, non-tender, non-distended, with normoactive bowel sounds. No organomegaly or masses felt. Ext: Dry calcaneal ulcers bilaterally without fluctuance or purulence. Heel boots in place. Skin: Sacral ulcer as pictured in general  surgery note, some wet slough on superior wound, not contaminated. Not examined today. Neuro: Confused Psych: Judgement and insight appear impaired. Mood & affect appropriate.   Data Reviewed: I have personally reviewed following labs and imaging studies  CBC: Recent Labs  Lab 04/08/17 1900 04/09/17 0527 04/10/17 0304  WBC 9.9 11.4* 12.0*  NEUTROABS 6.6  --   --   HGB 6.5* 8.7* 9.2*  HCT 19.9* 26.0* 27.8*  MCV 80.2 82.0 81.5  PLT 235 206 166   Basic Metabolic Panel: Recent Labs  Lab 04/08/17 1900 04/09/17 0527 04/10/17 0304  NA 134* 136 136  K 3.7 3.6 3.6  CL 104 108 108  CO2 24 21* 21*  GLUCOSE 129* 115* 97  BUN 21* 20 20  CREATININE 0.85 0.72 0.81  CALCIUM 8.2* 7.7* 8.0*   GFR: Estimated Creatinine Clearance: 54.7 mL/min (by C-G formula based on SCr of 0.81 mg/dL).   Liver Function Tests: Recent Labs  Lab 04/08/17 1900  AST 45*  ALT 12*  ALKPHOS 67  BILITOT 0.6  PROT 6.0*  ALBUMIN 1.8*   Coagulation Profile: Recent Labs  Lab 04/08/17 2115  INR 1.14    Urine analysis:    Component Value Date/Time   COLORURINE AMBER (A) 04/10/2017 0550   APPEARANCEUR CLEAR 04/10/2017 0550   LABSPEC 1.015 04/10/2017 0550   PHURINE 5.0 04/10/2017 0550   GLUCOSEU NEGATIVE 04/10/2017 0550   HGBUR NEGATIVE 04/10/2017 0550   BILIRUBINUR NEGATIVE 04/10/2017 0550   KETONESUR NEGATIVE 04/10/2017 0550   PROTEINUR NEGATIVE 04/10/2017 0550   NITRITE NEGATIVE 04/10/2017 0550   LEUKOCYTESUR NEGATIVE 04/10/2017 0550   Recent Results (from the past 240 hour(s))  Blood culture (routine x 2)     Status: None (Preliminary result)   Collection Time: 04/08/17  7:54 PM  Result Value Ref Range Status   Specimen Description BLOOD RIGHT ANTECUBITAL  Final   Special Requests   Final    BOTTLES DRAWN AEROBIC AND ANAEROBIC Blood Culture adequate volume   Culture   Final    NO GROWTH < 24 HOURS Performed at [facility], [place] [address], [place], [place] [address]    Report  Status PENDING  Incomplete  Blood culture (routine x 2)     Status: None (Preliminary result)   Collection Time: 04/08/17  7:56 PM  Result Value Ref Range Status   Specimen Description BLOOD LEFT ARM  Final   Special Requests   Final    BOTTLES DRAWN AEROBIC AND ANAEROBIC Blood Culture results may not be optimal due to an inadequate volume of blood received in culture bottles   Culture   Final    NO GROWTH < 24 HOURS Performed at [facility], [place] [address], [place], [place] [address]    Report Status PENDING  Incomplete  MRSA PCR Screening     Status: None   Collection Time: 04/10/17 12:45 AM  Result Value Ref Range Status   MRSA by PCR NEGATIVE NEGATIVE Final    Comment:        The GeneXpert MRSA Assay (FDA approved for NASAL specimens only), is one component of a comprehensive MRSA colonization surveillance program. It is not intended to diagnose MRSA infection nor to guide or monitor  treatment for MRSA infections.       Radiology Studies: Dg Chest 2 View  Result Date: 04/08/2017 CLINICAL DATA:  Fever and lethargy. EXAM: CHEST  2 VIEW COMPARISON:  03/21/2017 FINDINGS: Cardiomediastinal silhouette is normal. Mediastinal contours appear intact. Calcific atherosclerotic disease and tortuosity of the aorta. There is no evidence of focal airspace consolidation, pleural effusion or pneumothorax. Osseous structures are without acute abnormality. Soft tissues are grossly normal. IMPRESSION: No active cardiopulmonary disease. Electronically Signed   By: Fidela Salisbury M.D.   On: 04/08/2017 19:53   Dg Pelvis 1-2 Views  Result Date: 04/08/2017 CLINICAL DATA:  Pelvic pain.  Question osteomyelitis. EXAM: PELVIS - 1-2 VIEW COMPARISON:  03/13/2017 abdomen and pelvis CT. FINDINGS: Detection of osteomyelitis is not possible due to degree of calcified fibroids. Osteopenia, and bowel gas. There was a large ulcer over the left lower sacrum on previous CT. No evidence of fracture or  dislocation. IMPRESSION: Nondiagnostic for sacral osteomyelitis due to overlapping calcified fibroids and bowel gas. No acute finding. Electronically Signed   By: Monte Fantasia M.D.   On: 04/08/2017 20:57   Ct Head Wo Contrast  Result Date: 04/08/2017 CLINICAL DATA:  Altered level consciousness. History of hypertension, dementia, stroke and LEFT-sided deficits. EXAM: CT HEAD WITHOUT CONTRAST TECHNIQUE: Contiguous axial images were obtained from the base of the skull through the vertex without intravenous contrast. COMPARISON:  MRI of the head January 11, 2017 and CT HEAD October 09, 2016. FINDINGS: BRAIN: No intraparenchymal hemorrhage, mass effect nor midline shift. The ventricles and sulci are normal for age. Patchy supratentorial white matter hypodensities, with multifocal subcortical supra and infratentorial infarcts at site of prior hemorrhage. No acute large vascular territory infarcts. No abnormal extra-axial fluid collections. Basal cisterns are patent. VASCULAR: Moderate calcific atherosclerosis of the carotid siphons. SKULL: No skull fracture. No significant scalp soft tissue swelling. SINUSES/ORBITS: LEFT maxillary mucosal retention cyst. No paranasal sinus air-fluid levels. Mastoid air cells are well aerated. Included ocular globes and orbital contents are non-suspicious. OTHER: None. IMPRESSION: 1. No acute intracranial process. 2. Moderate chronic small vessel ischemic disease. Multifocal small old infarcts corresponding to hemorrhage on prior CT. Electronically Signed   By: Elon Alas M.D.   On: 04/08/2017 19:50    Scheduled Meds: . collagenase   Topical BID  . enoxaparin (LOVENOX) injection  40 mg Subcutaneous QHS  . feeding supplement (ENSURE ENLIVE)  237 mL Oral TID BM  . multivitamin with minerals  1 tablet Oral Daily  . nutrition supplement (JUVEN)  1 packet Oral BID BM   Continuous Infusions: . piperacillin-tazobactam (ZOSYN)  IV 3.375 g (04/10/17 1239)  . vancomycin Stopped  (04/10/17 0915)     LOS: 2 days   Time spent: 25 minutes.  Vance Gather, MD [facility] Pager [phone]  If 7PM-7AM, please contact night-coverage [email or web address] Password [email or web address] 04/10/2017, 1:35 PM

## 2017-04-11 LAB — URINE CULTURE: Culture: NO GROWTH

## 2017-04-11 LAB — CREATININE, SERUM
Creatinine, Ser: 0.84 mg/dL (ref 0.44–1.00)
GFR calc Af Amer: >60 mL/min (ref >60)
GFR calc non Af Amer: >60 mL/min (ref >60)

## 2017-04-11 MED ORDER — SODIUM CHLORIDE 0.45 % IV SOLN
INTRAVENOUS | Status: DC
  Administered 2017-04-11: 08:00:00 via INTRAVENOUS

## 2017-04-11 NOTE — Progress Notes (Signed)
PROGRESS NOTE  Julia Floyd  ZOX:[id number] DOB: 07-06-1930 DOA: 04/08/2017 PCP: Josetta Huddle, MD   Brief Narrative: Julia Floyd is an 84 y.o. female with a history of CVA/ICH s/p TPA with residual deficits, HTN, T2DM and sacral decubitus ulcer requiring debridement followed by hydrotherapy recently discharged 03/30/2017 to SNF who returned from SNF for fever and AMS, having decreased per oral intake. On arrival temperature was 99.30F, BP borderline hypotensive with improvement following IVF's. Lactate normal and hemoglobin down from recent discharge (7.4) at 6.5. CT of the head on admission shows no acute intracranial process there is moderate chronic small vessel ischemic disease and multifocal small old infarcts corresponding to hemorrhage on a prior CT.  Chest x-ray showed no active cardiopulmonary disease.  Two-view pelvic: Was nondiagnostic for sacral osteomyelitis secondary to overlapping calcified fibroids in bowel  gas.  There were no acute findings. IV fluids, vancomycin and zosyn were given empirically for osteomyelitis. Cultures drawn and 2u PRBCs provided. Admission to SDU requested.  Assessment & Plan: Principal Problem:   Sepsis associated hypotension (HCC) Active Problems:   Gait disturbance, post-stroke   Dysphagia   Alzheimer's dementia   Skin ulcer of sacrum with necrosis of muscle (HCC)   Sacral osteomyelitis (HCC)   Anemia of chronic disease  Sepsis likely due to osteomyelitis of sacrum: Lactate reassuring. - Monitor blood cultures, NGTD. Urine culture no growth.  - Continue broad IV abx.  - Start IVF's. - Still requires SDU-level monitoring.   Unstageable sacral pressure ulcer with osteomyelitis: s/p debridement 12/17 at recent admission. - Appreciate general surgery recommendations: Now undergoing hydrotherapy. No need for repeat debridement. - RD consulted, nutritional supplement as ordered.  Normocytic anemia of chronic disease: Ferritin 1,477.     - Hgb 6.5 on admission, up to 9.2 w/2u PRBCs with improved blood pressure.  Thrombocytopenia: Resolved, likely due to linezolid given at previous admission.  Critical limb ischemia, PAD, complicated by bilateral heel pressure wounds: [name] 0.37 (R) and 0.28 (L).  - Consulted vascular surgery, Dr. Bridgett Larsson, during last admission 12/28, felt conservative measures appropriate.  - Wound care per WOC, floating heels  History of T2DM: (last was 5.1%).  - Hold metformin  Hypertension: - Hold norvasc due to hypotension  History of CVA/ICH with dysphagia: Residual deficits have left her bedbound/wheelchair bound. - Dysphagia 2 diet  DVT prophylaxis: Lovenox Code Status: Full Family Communication: None at bedside Disposition Plan: Transfer to floor once hypotension improving, possibly 1/13. Consultants: General surgery  Procedures:   None  Antimicrobials:  Vanc/zosyn 1/9 >>    Subjective: Had an ok night, actually eating some. Fever to 100.40F today.  Objective: Vitals:   04/11/17 0900 04/11/17 1000 04/11/17 1100 04/11/17 1200  BP: (!) 107/32 (!) 131/40 (!) 109/49 (!) 109/42  Pulse: 87 88 88 89  Resp: 19 (!) 28 (!) 22 20  Temp:    (!) 100.4 F (38 C)  TempSrc:    Oral  SpO2: 98% 99% 99% 99%  Weight:      Height:        Intake/Output Summary (Last 24 hours) at 04/11/2017 1418 Last data filed at 04/11/2017 1200 Gross per 24 hour  Intake 920 ml  Output 750 ml  Net 170 ml   Filed Weights   04/08/17 1821  Weight: 78.5 kg (173 lb)   Gen: Elderly female in no distress resting quietly Pulm: Non-labored tachypnea. Clear to auscultation bilaterally.  CV: Regular rate and rhythm. No murmur, rub, or gallop. No JVD,  no pedal edema. GI: Abdomen soft, non-tender, non-distended, with normoactive bowel sounds. No organomegaly or masses felt. Ext: Dry calcaneal ulcers bilaterally without fluctuance or purulence. Heel boots in place. Skin: Sacral ulcer as pictured in general  surgery note, some wet slough on superior wound, not contaminated. Not examined today. Neuro: Confused but more alert today. No new focal deficits.  Psych: Judgement and insight appear impaired. Mood & affect appropriate.   Data Reviewed: I have personally reviewed following labs and imaging studies  CBC: Recent Labs  Lab 04/08/17 1900 04/09/17 0527 04/10/17 0304  WBC 9.9 11.4* 12.0*  NEUTROABS 6.6  --   --   HGB 6.5* 8.7* 9.2*  HCT 19.9* 26.0* 27.8*  MCV 80.2 82.0 81.5  PLT 235 206 628   Basic Metabolic Panel: Recent Labs  Lab 04/08/17 1900 04/09/17 0527 04/10/17 0304 04/11/17 0340  NA 134* 136 136  --   K 3.7 3.6 3.6  --   CL 104 108 108  --   CO2 24 21* 21*  --   GLUCOSE 129* 115* 97  --   BUN 21* 20 20  --   CREATININE 0.85 0.72 0.81 0.84  CALCIUM 8.2* 7.7* 8.0*  --    GFR: Estimated Creatinine Clearance: 52.7 mL/min (by C-G formula based on SCr of 0.84 mg/dL).   Liver Function Tests: Recent Labs  Lab 04/08/17 1900  AST 45*  ALT 12*  ALKPHOS 67  BILITOT 0.6  PROT 6.0*  ALBUMIN 1.8*   Coagulation Profile: Recent Labs  Lab 04/08/17 2115  INR 1.14    Urine analysis:    Component Value Date/Time   COLORURINE AMBER (A) 04/10/2017 0550   APPEARANCEUR CLEAR 04/10/2017 0550   LABSPEC 1.015 04/10/2017 0550   PHURINE 5.0 04/10/2017 0550   GLUCOSEU NEGATIVE 04/10/2017 0550   HGBUR NEGATIVE 04/10/2017 0550   BILIRUBINUR NEGATIVE 04/10/2017 0550   KETONESUR NEGATIVE 04/10/2017 0550   PROTEINUR NEGATIVE 04/10/2017 0550   NITRITE NEGATIVE 04/10/2017 0550   LEUKOCYTESUR NEGATIVE 04/10/2017 0550   Recent Results (from the past 240 hour(s))  Blood culture (routine x 2)     Status: None (Preliminary result)   Collection Time: 04/08/17  7:54 PM  Result Value Ref Range Status   Specimen Description BLOOD RIGHT ANTECUBITAL  Final   Special Requests   Final    BOTTLES DRAWN AEROBIC AND ANAEROBIC Blood Culture adequate volume   Culture   Final    NO GROWTH 3  DAYS Performed at [facility], [place] [address], [place], [place] [address]    Report Status PENDING  Incomplete  Blood culture (routine x 2)     Status: None (Preliminary result)   Collection Time: 04/08/17  7:56 PM  Result Value Ref Range Status   Specimen Description BLOOD LEFT ARM  Final   Special Requests   Final    BOTTLES DRAWN AEROBIC AND ANAEROBIC Blood Culture results may not be optimal due to an inadequate volume of blood received in culture bottles   Culture   Final    NO GROWTH 3 DAYS Performed at [facility], [place] [address] [place], [place] [address]    Report Status PENDING  Incomplete  MRSA PCR Screening     Status: None   Collection Time: 04/10/17 12:45 AM  Result Value Ref Range Status   MRSA by PCR NEGATIVE NEGATIVE Final    Comment:        The GeneXpert MRSA Assay (FDA approved for  NASAL specimens only), is one component of a comprehensive MRSA colonization surveillance program. It is not intended to diagnose MRSA infection nor to guide or monitor treatment for MRSA infections.   Culture, Urine     Status: None   Collection Time: 04/10/17  5:50 AM  Result Value Ref Range Status   Specimen Description URINE, CATHETERIZED  Final   Special Requests NONE  Final   Culture   Final    NO GROWTH Performed at [facility], [address], [place], [place] [address]    Report Status 04/11/2017 FINAL  Final      Radiology Studies: No results found.  Scheduled Meds: . collagenase   Topical BID  . enoxaparin (LOVENOX) injection  40 mg Subcutaneous QHS  . feeding supplement (ENSURE ENLIVE)  237 mL Oral TID BM  . multivitamin with minerals  1 tablet Oral Daily  . nutrition supplement (JUVEN)  1 packet Oral BID BM   Continuous Infusions: . sodium chloride 100 mL/hr at 04/11/17 1200  . piperacillin-tazobactam (ZOSYN)  IV 3.375 g (04/11/17 1106)  . vancomycin Stopped (04/11/17 0900)     LOS: 3 days   Time spent: 25 minutes.  Vance Gather, MD [facility] Pager [phone]  If 7PM-7AM, please contact night-coverage [email or web address] Password [facility] 04/11/2017, 2:18 PM

## 2017-04-11 NOTE — Progress Notes (Signed)
HYDROTHERAPY    04/11/17    Subjective Assessment  Subjective Pt did not speak during session. She did express discomfort with repositioning.  Patient and Family Stated Goals none stated  Date of Onset (unknown-present on last admission 03/14/17)  Prior Treatments hydrotherapy initiated 03/17/17  Evaluation and Treatment  Evaluation and Treatment Procedures Explained to Patient/Family Yes  Evaluation and Treatment Procedures Patient unable to consent due to mental status  Pressure Injury 04/10/17 Stage IV - Full thickness tissue loss with exposed bone, tendon or muscle. *PT*  S/P I&D 03/16/17  Date First Assessed/Time First Assessed: 04/10/17 1035  Location: Sacrum  Staging: Stage IV - Full thickness tissue loss with exposed bone, tendon or muscle.  Wound Description (Comments): *PT*  S/P I&D 03/16/17  Present on Admission: Yes  Dressing Type ABD;Barrier Film (skin prep);Gauze (Comment);Moist to dry (Santyl)  Dressing Changed  Dressing Change Frequency Twice a day  State of Healing Non-healing  Site / Wound Assessment Brown;Yellow;Pink;Red  % Wound base Red or Granulating 70%  % Wound base Yellow/Fibrinous Exudate 30%  Peri-wound Assessment Intact  Wound Length (cm) 15 cm  Wound Width (cm) 13 cm  Wound Depth (cm) 3 cm  Wound Surface Area (cm^2) 195 cm^2  Wound Volume (cm^3) 585 cm^3  Undermining (cm) 3 (12:00-2:00)  Margins Unattached edges (unapproximated)  Drainage Amount Moderate  Drainage Description Serosanguineous  Treatment Debridement (Selective);Hydrotherapy (Pulse lavage);Packing (Saline gauze) (Santyl)  Pressure Injury 04/10/17 Unstageable - Full thickness tissue loss in which the base of the ulcer is covered by slough (yellow, tan, gray, green or brown) and/or eschar (tan, brown or black) in the wound bed. *PT*  Date First Assessed/Time First Assessed: 03/17/17 1340   Location: Ischial tuberosity  Location Orientation: Left  Staging: Unstageable - Full thickness tissue  loss in which the base of the ulcer is covered by slough (yellow, tan, gray, green or brown...  Dressing Type ABD;Gauze (Comment);Barrier Film (skin prep);Moist to dry (Santyl)  Dressing Changed  Dressing Change Frequency Twice a day  State of Healing Early/partial granulation  Site / Wound Assessment Yellow;Pink  % Wound base Red or Granulating 90%  % Wound base Yellow/Fibrinous Exudate 10%  Peri-wound Assessment Pink  Wound Length (cm) 4 cm  Wound Width (cm) 6 cm  Wound Depth (cm) 1.5 cm  Wound Surface Area (cm^2) 24 cm^2  Wound Volume (cm^3) 36 cm^3  Margins Unattached edges (unapproximated)  Drainage Amount Minimal  Drainage Description Serosanguineous  Treatment Debridement (Selective);Hydrotherapy (Pulse lavage);Packing (Saline gauze) (Santyl)  Hydrotherapy  Pulsed Lavage with Suction (psi) 8 psi  Pulsed Lavage with Suction - Normal Saline Used 1000 mL  Pulsed Lavage Tip Tip with splash shield  Pulsed lavage therapy - wound location sacral  Selective Debridement  Selective Debridement - Location sacrum  Selective Debridement - Tools Used Forceps;Scissors  Selective Debridement - Tissue Removed brown and yellow tissue  Wound Therapy - Assess/Plan/Recommendations  Wound Therapy - Clinical Statement 84 yo female admitted from SNF with multiple wounds including sacral wound found to have osteomyelitis. S/P Incision and Drainage 03/16/17. Hx CVA-primarily bedbound.   Factors Delaying/Impairing Wound Healing Diabetes Mellitus;Incontinence;Immobility;Infection - systemic/local  Hydrotherapy Plan Debridement;Dressing change;Patient/family education;Pulsatile lavage with suction  Wound Therapy - Frequency 6X / week  Wound Therapy - Current Recommendations Case manager/social work  Wound Therapy - Follow Up Recommendations Skilled nursing facility  Wound Plan Consulted for hydrotherapy-per WOCN plan is for wound vac placement if possible. Performed hydrotherapy and dressing change for  L IT wound and  sacral wound today. Hydrotherapy for IT wound no longer needed. Will continue hydrotherapy/debridement on sacral wound only.   Wound Therapy Goals - Improve the function of patient's integumentary system by progressing the wound(s) through the phases of wound healing by:  Decrease Necrotic Tissue to 20  Decrease Necrotic Tissue - Progress Goal set today  Increase Granulation Tissue to 80  Increase Granulation Tissue - Progress Goal set today  Goals/treatment plan/discharge plan were made with and agreed upon by patient/family No, Patient unable to participate in goals/treatment/discharge plan and family unavailable  Time For Goal Achievement 2 weeks  Wound Therapy - Potential for Goals Poor    Pt tolerated well 14:46 - 15:18  Rica Koyanagi  PTA [facility]  Acute  Rehab Pager      [phone]

## 2017-04-11 NOTE — Progress Notes (Signed)
Pharmacy Antibiotic Note  Julia Floyd is a 84 y.o. female admitted on 04/08/2017 with sepsis.  Pharmacy has been consulted for vancomycin and zosyn dosing.  04/11/2017  BCx: NGTD Scr 0.84, CrCl ~ 52.7 mls/min WBC 12.0 (1/11) afebrile  Plan: Continue Vancomycin 1g IV q24h for goal AUC 400-500.  Continue Zosyn 3.375g IV q8h (4 hour infusion time).  Daily SCr. F/u LOT  Height: 5\' 6"  (167.6 cm) Weight: 173 lb (78.5 kg) IBW/kg (Calculated) : 59.3  Temp (24hrs), Avg:98.6 F (37 C), Min:97.8 F (36.6 C), Max:99.1 F (37.3 C)  Recent Labs  Lab 04/08/17 1900 04/08/17 1909 04/08/17 2115 04/08/17 2129 04/09/17 0527 04/10/17 0304 04/11/17 0340  WBC 9.9  --   --   --  11.4* 12.0*  --   CREATININE 0.85  --   --   --  0.72 0.81 0.84  LATICACIDVEN  --  1.01 1.1 1.06 1.0  --   --     Estimated Creatinine Clearance: 52.7 mL/min (by C-G formula based on SCr of 0.84 mg/dL).    No Known Allergies  Antimicrobials this admission: 1/9 Vanc >>  1/9 Zosyn >>   Dose adjustments this admission:   Microbiology results: 1/9 BCx: NGTD 1/11 MRSA PCR: neg  Thank you for allowing pharmacy to be a part of this patient's care.  Dolly Rias RPh 04/11/2017, 12:49 PM Pager [phone]

## 2017-04-12 DIAGNOSIS — R509 Fever, unspecified: Secondary | ICD-10-CM | POA: Diagnosis present

## 2017-04-12 DIAGNOSIS — Z7401 Bed confinement status: Secondary | ICD-10-CM

## 2017-04-12 DIAGNOSIS — I10 Essential (primary) hypertension: Secondary | ICD-10-CM

## 2017-04-12 DIAGNOSIS — F0390 Unspecified dementia without behavioral disturbance: Secondary | ICD-10-CM

## 2017-04-12 DIAGNOSIS — Z87891 Personal history of nicotine dependence: Secondary | ICD-10-CM

## 2017-04-12 DIAGNOSIS — E11621 Type 2 diabetes mellitus with foot ulcer: Secondary | ICD-10-CM

## 2017-04-12 DIAGNOSIS — L97429 Non-pressure chronic ulcer of left heel and midfoot with unspecified severity: Secondary | ICD-10-CM

## 2017-04-12 DIAGNOSIS — E11622 Type 2 diabetes mellitus with other skin ulcer: Secondary | ICD-10-CM

## 2017-04-12 DIAGNOSIS — E46 Unspecified protein-calorie malnutrition: Secondary | ICD-10-CM

## 2017-04-12 DIAGNOSIS — L89159 Pressure ulcer of sacral region, unspecified stage: Secondary | ICD-10-CM

## 2017-04-12 DIAGNOSIS — I7090 Unspecified atherosclerosis: Secondary | ICD-10-CM

## 2017-04-12 DIAGNOSIS — L97419 Non-pressure chronic ulcer of right heel and midfoot with unspecified severity: Secondary | ICD-10-CM

## 2017-04-12 DIAGNOSIS — Z8673 Personal history of transient ischemic attack (TIA), and cerebral infarction without residual deficits: Secondary | ICD-10-CM

## 2017-04-12 DIAGNOSIS — E440 Moderate protein-calorie malnutrition: Secondary | ICD-10-CM | POA: Diagnosis present

## 2017-04-12 LAB — BASIC METABOLIC PANEL
Anion gap: 7 (ref 5–15)
BUN: 18 mg/dL (ref 6–20)
CHLORIDE: 108 mmol/L (ref 101–111)
CO2: 21 mmol/L — ABNORMAL LOW (ref 22–32)
CREATININE: 0.76 mg/dL (ref 0.44–1.00)
Calcium: 7.8 mg/dL — ABNORMAL LOW (ref 8.9–10.3)
GFR calc non Af Amer: >60 mL/min (ref >60)
GLUCOSE: 82 mg/dL (ref 65–99)
Potassium: 3.3 mmol/L — ABNORMAL LOW (ref 3.5–5.1)
Sodium: 136 mmol/L (ref 135–145)

## 2017-04-12 LAB — CBC
HCT: 25.8 % — ABNORMAL LOW (ref 36.0–46.0)
HEMOGLOBIN: 8.3 g/dL — AB (ref 12.0–15.0)
MCH: 26.8 pg (ref 26.0–34.0)
MCHC: 32.2 g/dL (ref 30.0–36.0)
MCV: 83.2 fL (ref 78.0–100.0)
PLATELETS: 202 10*3/uL (ref 150–400)
RBC: 3.1 MIL/uL — AB (ref 3.87–5.11)
RDW: 18.1 % — ABNORMAL HIGH (ref 11.5–15.5)
WBC: 9.6 10*3/uL (ref 4.0–10.5)

## 2017-04-12 LAB — GLUCOSE, CAPILLARY: Glucose-Capillary: 76 mg/dL (ref 65–99)

## 2017-04-12 MED ORDER — POTASSIUM CHLORIDE IN NACL 20-0.45 MEQ/L-% IV SOLN
INTRAVENOUS | Status: DC
  Administered 2017-04-12 (×2): via INTRAVENOUS
  Filled 2017-04-12 (×3): qty 1000

## 2017-04-12 NOTE — Progress Notes (Signed)
PROGRESS NOTE  Julia Floyd  ZOX:[id number] DOB: 03-Mar-1931 DOA: 04/08/2017 PCP: Josetta Huddle, MD   Brief Narrative: Julia Floyd is an 84 y.o. female with a history of CVA/ICH s/p TPA with residual deficits, HTN, T2DM and sacral decubitus ulcer requiring debridement followed by hydrotherapy recently discharged 03/30/2017 to SNF who returned from SNF for fever and AMS, having decreased per oral intake. On arrival temperature was 99.32F, BP borderline hypotensive with improvement following IVF's. Lactate normal and hemoglobin down from recent discharge (7.4) at 6.5. CT of the head on admission shows no acute intracranial process there is moderate chronic small vessel ischemic disease and multifocal small old infarcts corresponding to hemorrhage on a prior CT.  Chest x-ray showed no active cardiopulmonary disease.  Two-view pelvic: Was nondiagnostic for sacral osteomyelitis secondary to overlapping calcified fibroids in bowel  gas.  There were no acute findings. IV fluids, vancomycin and zosyn were given empirically for osteomyelitis. Cultures drawn and 2u PRBCs provided. Admission to SDU requested.  Assessment & Plan: Principal Problem:   Sepsis associated hypotension (HCC) Active Problems:   Gait disturbance, post-stroke   Dysphagia   Alzheimer's dementia   Skin ulcer of sacrum with necrosis of muscle (HCC)   Sacral osteomyelitis (HCC)   Anemia of chronic disease  Sepsis likely due to osteomyelitis of sacrum: Lactate reassuring. Urine culture no growth. - Leukocytosis resolved, though continuing to have fevers. I've asked for ID assistance.   - Monitor blood cultures, NGTD.  - Continue broad IV abx pending ID evaluation. - Start IVF's. - Still requiring SDU-level monitoring. Full code.   Unstageable sacral pressure ulcer with osteomyelitis: s/p debridement 12/17 at recent admission. - Appreciate general surgery recommendations: Now undergoing hydrotherapy (1/10, 1/12). No need  for repeat debridement. - RD consulted, nutritional supplement as ordered.  Normocytic anemia of chronic disease: Ferritin 1,477.   - Hgb 6.5 on admission, up to 9.2 w/2u PRBCs. Monitoring CBC.   Thrombocytopenia: Resolved, likely due to linezolid given at previous admission.  Hypokalemia:  - Replace in IVF's and recheck in AM  Critical limb ischemia, PAD, complicated by bilateral heel pressure wounds: [name] 0.37 (R) and 0.28 (L).  - Consulted vascular surgery, Dr. Bridgett Larsson, during last admission 12/28, felt conservative measures appropriate.  - Wound care per WOC, floating heels.   History of T2DM: (last was 5.1%).  - Holding metformin  Hypertension: - Holding norvasc due to hypotension  History of CVA/ICH with dysphagia: Residual deficits have left her bedbound/wheelchair bound. - Dysphagia 2 diet  DVT prophylaxis: Lovenox Code Status: Full Family Communication: None at bedside Disposition Plan: Transfer to floor once hypotension improving. Consultants: General surgery, ID, palliative care  Procedures:   None  Antimicrobials:  Vanc/zosyn 1/9 >>    Subjective: Fever again this morning. Denies pain.  Objective: Vitals:   04/12/17 0731 04/12/17 0800 04/12/17 0900 04/12/17 1000  BP:  (!) 120/35 (!) 123/27 (!) 105/24  Pulse:  84 83 80  Resp:  (!) 23 15 (!) 22  Temp: (!) 101.8 F (38.8 C)     TempSrc: Oral     SpO2:  99% 99% 100%  Weight:      Height:        Intake/Output Summary (Last 24 hours) at 04/12/2017 1208 Last data filed at 04/12/2017 1100 Gross per 24 hour  Intake 2570 ml  Output 350 ml  Net 2220 ml   Filed Weights   04/08/17 1821  Weight: 78.5 kg (173 lb)   Gen: Elderly  female in no distress resting quietly Pulm: Non-labored tachypnea. Clear to auscultation bilaterally.  CV: Regular rate and rhythm. No murmur, rub, or gallop. No JVD, no pedal edema. GI: Abdomen soft, non-tender, non-distended, with normoactive bowel sounds. No organomegaly  or masses felt. Ext: Dry calcaneal ulcers bilaterally without fluctuance or purulence. Heel boots in place. Skin: Sacral ulcer as pictured in general surgery note, some wet slough on superior wound, not contaminated. Not examined today. Neuro: Alert, oriented to place only. No new focal deficits.  Psych: Judgement and insight appear impaired. Mood & affect appropriate.   Data Reviewed: I have personally reviewed following labs and imaging studies  CBC: Recent Labs  Lab 04/08/17 1900 04/09/17 0527 04/10/17 0304 04/12/17 0334  WBC 9.9 11.4* 12.0* 9.6  NEUTROABS 6.6  --   --   --   HGB 6.5* 8.7* 9.2* 8.3*  HCT 19.9* 26.0* 27.8* 25.8*  MCV 80.2 82.0 81.5 83.2  PLT 235 206 232 332   Basic Metabolic Panel: Recent Labs  Lab 04/08/17 1900 04/09/17 0527 04/10/17 0304 04/11/17 0340 04/12/17 0334  NA 134* 136 136  --  136  K 3.7 3.6 3.6  --  3.3*  CL 104 108 108  --  108  CO2 24 21* 21*  --  21*  GLUCOSE 129* 115* 97  --  82  BUN 21* 20 20  --  18  CREATININE 0.85 0.72 0.81 0.84 0.76  CALCIUM 8.2* 7.7* 8.0*  --  7.8*   GFR: Estimated Creatinine Clearance: 55.4 mL/min (by C-G formula based on SCr of 0.76 mg/dL).   Liver Function Tests: Recent Labs  Lab 04/08/17 1900  AST 45*  ALT 12*  ALKPHOS 67  BILITOT 0.6  PROT 6.0*  ALBUMIN 1.8*   Coagulation Profile: Recent Labs  Lab 04/08/17 2115  INR 1.14    Urine analysis:    Component Value Date/Time   COLORURINE AMBER (A) 04/10/2017 0550   APPEARANCEUR CLEAR 04/10/2017 0550   LABSPEC 1.015 04/10/2017 0550   PHURINE 5.0 04/10/2017 0550   GLUCOSEU NEGATIVE 04/10/2017 0550   HGBUR NEGATIVE 04/10/2017 0550   BILIRUBINUR NEGATIVE 04/10/2017 0550   KETONESUR NEGATIVE 04/10/2017 0550   PROTEINUR NEGATIVE 04/10/2017 0550   NITRITE NEGATIVE 04/10/2017 0550   LEUKOCYTESUR NEGATIVE 04/10/2017 0550   Recent Results (from the past 240 hour(s))  Blood culture (routine x 2)     Status: None (Preliminary result)   Collection  Time: 04/08/17  7:54 PM  Result Value Ref Range Status   Specimen Description BLOOD RIGHT ANTECUBITAL  Final   Special Requests   Final    BOTTLES DRAWN AEROBIC AND ANAEROBIC Blood Culture adequate volume   Culture   Final    NO GROWTH 3 DAYS Performed at [facility], [place] [address], [place], [place] [address]    Report Status PENDING  Incomplete  Blood culture (routine x 2)     Status: None (Preliminary result)   Collection Time: 04/08/17  7:56 PM  Result Value Ref Range Status   Specimen Description BLOOD LEFT ARM  Final   Special Requests   Final    BOTTLES DRAWN AEROBIC AND ANAEROBIC Blood Culture results may not be optimal due to an inadequate volume of blood received in culture bottles   Culture   Final    NO GROWTH 3 DAYS Performed at [facility], [place] [address], [place],  [address]    Report Status PENDING  Incomplete  MRSA PCR  Screening     Status: None   Collection Time: 04/10/17 12:45 AM  Result Value Ref Range Status   MRSA by PCR NEGATIVE NEGATIVE Final    Comment:        The GeneXpert MRSA Assay (FDA approved for NASAL specimens only), is one component of a comprehensive MRSA colonization surveillance program. It is not intended to diagnose MRSA infection nor to guide or monitor treatment for MRSA infections.   Culture, Urine     Status: None   Collection Time: 04/10/17  5:50 AM  Result Value Ref Range Status   Specimen Description URINE, CATHETERIZED  Final   Special Requests NONE  Final   Culture   Final    NO GROWTH Performed at [facility], [address], [place], [place] [address]    Report Status 04/11/2017 FINAL  Final      Radiology Studies: No results found.  Scheduled Meds: . collagenase   Topical BID  . enoxaparin (LOVENOX) injection  40 mg Subcutaneous QHS  . feeding supplement (ENSURE ENLIVE)  237 mL Oral TID BM  . multivitamin with minerals  1 tablet Oral Daily  . nutrition supplement (JUVEN)  1 packet  Oral BID BM   Continuous Infusions: . 0.45 % NaCl with KCl 20 mEq / L 100 mL/hr at 04/12/17 1100  . piperacillin-tazobactam (ZOSYN)  IV Stopped (04/12/17 0745)  . vancomycin Stopped (04/12/17 0918)     LOS: 4 days   Time spent: 25 minutes.  Vance Gather, MD [facility] Pager [phone]  If 7PM-7AM, please contact night-coverage [email or web address] Password [facility] 04/12/2017, 12:08 PM

## 2017-04-12 NOTE — Consult Note (Signed)
[facility] for Infectious Disease    Date of Admission:  04/08/2017           Day 4 vancomycin        Day 4 piperacillin tazobactam       Reason for Consult: Fever    Referring Provider: Dr. Vance Gather  Assessment: I suspect that her intermittent fevers are from her sacral wound and osteomy.  I note that she had intermittent fevers while on empiric antibiotics during her last hospitalization.  I favor continuing current antibiotic therapy and continuing to address appropriate goals of care with her family.  A long course of brought in therapy could address infection in her wound but will not do anything to help her heal the chronic wound.  She is unable to offload pressure and she is malnourished.  The chance of her ever healing this wound is essentially nil.  Plan: 1. Continue current antibiotics for now  Principal Problem:   Fever Active Problems:   Sacral decubitus ulcer   Sacral osteomyelitis (HCC)   Gait disturbance, post-stroke   Dysphagia   Alzheimer's dementia   Sepsis associated hypotension (HCC)   Anemia of chronic disease   Moderate protein-calorie malnutrition (HCC)   Scheduled Meds: . collagenase   Topical BID  . enoxaparin (LOVENOX) injection  40 mg Subcutaneous QHS  . feeding supplement (ENSURE ENLIVE)  237 mL Oral TID BM  . multivitamin with minerals  1 tablet Oral Daily  . nutrition supplement (JUVEN)  1 packet Oral BID BM   Continuous Infusions: . 0.45 % NaCl with KCl 20 mEq / L 100 mL/hr at 04/12/17 1200  . piperacillin-tazobactam (ZOSYN)  IV Stopped (04/12/17 0745)  . vancomycin Stopped (04/12/17 0918)   PRN Meds:.acetaminophen **OR** acetaminophen, ondansetron **OR** ondansetron (ZOFRAN) IV  HPI: Julia Floyd is a 84 y.o. female with a history of CVA, dementia, diabetes, hypertension and atherosclerosis.  She is bedridden.  She has a large sacral decubitus as well as bilateral heel decubiti.  She was hospitalized from 03/13/2017  to 03/30/2017 and treated for sacral wound infection and probable sacral osteomyelitis with empiric antibiotic therapy.  She was readmitted on 04/08/2017 with fever and decreased mental status.  She was started on empiric vancomycin and piperacillin tazobactam but has continued to have occasional fever.  Admission UA, urine culture and blood cultures are normal/negative.  Chest x-ray did not reveal any evidence of pneumonia.   Review of Systems: Review of Systems  Unable to perform ROS: Dementia    Past Medical History:  Diagnosis Date  . DDD (degenerative disc disease)   . Depression   . Diabetes ([place])   . HTN (hypertension)   . Psychotic disorder ([place])    secondary to the below factors. 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 Bipolar disorder, most likely type 3, this episode being manic  . Sixth cranial nerve palsy   . Stroke ([place])   . Trigeminal neuralgia   . Uterine fibroid     Social History   Tobacco Use  . Smoking status: Former Research scientist (life sciences)  . Smokeless tobacco: Never Used  Substance Use Topics  . Alcohol use: No  . Drug use: No    Family History  Problem Relation Age of Onset  . Heart attack Unknown   . Diabetes Unknown    No Known Allergies  OBJECTIVE: Blood pressure (!) 124/32, pulse 85, temperature 99.8 F (37.7 C), temperature source Oral, resp. rate Marland Kitchen)  25, height 5\' 6"  (1.676 m), weight 173 lb (78.5 kg), SpO2 99 %.  Physical Exam  Constitutional:  She is resting quietly in bed.  She answers to her name.  Cardiovascular: Normal rate and regular rhythm.  No murmur heard. Pulmonary/Chest: Effort normal and breath sounds normal. She has no wheezes. She has no rales.  Abdominal: Soft. She exhibits no distension. There is no tenderness.  Pictures of large sacral wound in the general surgery note on 04/09/2017 and WOC note reviewed.  Neurological: She is alert.  Skin: No rash noted.  IV site looks good.    Lab Results Lab Results  Component  Value Date   WBC 9.6 04/12/2017   HGB 8.3 (L) 04/12/2017   HCT 25.8 (L) 04/12/2017   MCV 83.2 04/12/2017   PLT 202 04/12/2017    Lab Results  Component Value Date   CREATININE 0.76 04/12/2017   BUN 18 04/12/2017   NA 136 04/12/2017   K 3.3 (L) 04/12/2017   CL 108 04/12/2017   CO2 21 (L) 04/12/2017    Lab Results  Component Value Date   ALT 12 (L) 04/08/2017   AST 45 (H) 04/08/2017   ALKPHOS 67 04/08/2017   BILITOT 0.6 04/08/2017     Microbiology: Recent Results (from the past 240 hour(s))  Blood culture (routine x 2)     Status: None (Preliminary result)   Collection Time: 04/08/17  7:54 PM  Result Value Ref Range Status   Specimen Description BLOOD RIGHT ANTECUBITAL  Final   Special Requests   Final    BOTTLES DRAWN AEROBIC AND ANAEROBIC Blood Culture adequate volume   Culture   Final    NO GROWTH 3 DAYS Performed at [facility], [place] [address], [place], [place] [address]    Report Status PENDING  Incomplete  Blood culture (routine x 2)     Status: None (Preliminary result)   Collection Time: 04/08/17  7:56 PM  Result Value Ref Range Status   Specimen Description BLOOD LEFT ARM  Final   Special Requests   Final    BOTTLES DRAWN AEROBIC AND ANAEROBIC Blood Culture results may not be optimal due to an inadequate volume of blood received in culture bottles   Culture   Final    NO GROWTH 3 DAYS Performed at [facility], [place] [address] [place], [place] [address]    Report Status PENDING  Incomplete  MRSA PCR Screening     Status: None   Collection Time: 04/10/17 12:45 AM  Result Value Ref Range Status   MRSA by PCR NEGATIVE NEGATIVE Final    Comment:        The GeneXpert MRSA Assay (FDA approved for NASAL specimens only), is one component of a comprehensive MRSA colonization surveillance program. It is not intended to diagnose MRSA infection nor to guide or monitor treatment for MRSA infections.   Culture, Urine     Status: None   Collection  Time: 04/10/17  5:50 AM  Result Value Ref Range Status   Specimen Description URINE, CATHETERIZED  Final   Special Requests NONE  Final   Culture   Final    NO GROWTH Performed at [facility], [address], [place],  [address]    Report Status 04/11/2017 FINAL  Final    Michel Bickers, MD [facility] [phone] pager   [phone] cell 04/12/2017, 12:52 PM

## 2017-04-13 ENCOUNTER — Other Ambulatory Visit: Payer: Self-pay

## 2017-04-13 LAB — CULTURE, BLOOD (ROUTINE X 2)
CULTURE: NO GROWTH
CULTURE: NO GROWTH
Special Requests: ADEQUATE

## 2017-04-13 LAB — BASIC METABOLIC PANEL
ANION GAP: 6 (ref 5–15)
BUN: 14 mg/dL (ref 6–20)
CALCIUM: 7.7 mg/dL — AB (ref 8.9–10.3)
CO2: 20 mmol/L — ABNORMAL LOW (ref 22–32)
Chloride: 108 mmol/L (ref 101–111)
Creatinine, Ser: 0.74 mg/dL (ref 0.44–1.00)
Glucose, Bld: 72 mg/dL (ref 65–99)
POTASSIUM: 4.1 mmol/L (ref 3.5–5.1)
Sodium: 134 mmol/L — ABNORMAL LOW (ref 135–145)

## 2017-04-13 MED ORDER — SODIUM CHLORIDE 0.9 % IV SOLN
INTRAVENOUS | Status: DC
Start: 2017-04-13 — End: 2017-04-17
  Administered 2017-04-13 – 2017-04-16 (×6): via INTRAVENOUS
  Filled 2017-04-13 (×12): qty 1000

## 2017-04-13 NOTE — Progress Notes (Addendum)
  Subjective: Awake.  Answers questions reasonably appropriately.  Very feeble and deconditioned and lethargic. Temp 101.8  Remains on Zosyn and vancomycin. ID following   Objective: Vital signs in last 24 hours: Temp:  [99.8 F (37.7 C)-101.8 F (38.8 C)] 100.3 F (37.9 C) (01/14 0339) Pulse Rate:  [80-101] 93 (01/14 0300) Resp:  [15-31] 31 (01/14 0300) BP: (105-131)/(24-42) 121/39 (01/14 0200) SpO2:  [97 %-100 %] 99 % (01/14 0300) Last BM Date: 04/12/17  Intake/Output from previous day: 01/13 0701 - 01/14 0700 In: 2720 [I.V.:2370; IV Piggyback:350] Out: 375 [Urine:375] Intake/Output this shift: Total I/O In: 850 [I.V.:800; IV Piggyback:50] Out: 75 [Urine:75]   PE General appearance: slowed mentation Resp: clear to auscultation bilaterally Cardio: regular rate and rhythm GI: soft, nontender  Wound:  Not examined at this hour.  Plan to evaluate today    04/13/16   Lab Results:  Recent Labs    04/12/17 0334  WBC 9.6  HGB 8.3*  HCT 25.8*  PLT 202   BMET Recent Labs    04/12/17 0334 04/13/17 0333  NA 136 134*  K 3.3* 4.1  CL 108 108  CO2 21* 20*  GLUCOSE 82 72  BUN 18 14  CREATININE 0.76 0.74  CALCIUM 7.8* 7.7*   PT/INR No results for input(s): LABPROT, INR in the last 72 hours. ABG No results for input(s): PHART, HCO3 in the last 72 hours.  Invalid input(s): PCO2, PO2  Studies/Results: No results found.  Anti-infectives: Anti-infectives (From admission, onward)   Start     Dose/Rate Route Frequency Ordered Stop   04/09/17 0800  vancomycin (VANCOCIN) IVPB 1000 mg/200 mL premix     1,000 mg 200 mL/hr over 60 Minutes Intravenous Every 24 hours 04/08/17 2128     04/09/17 0400  piperacillin-tazobactam (ZOSYN) IVPB 3.375 g     3.375 g 12.5 mL/hr over 240 Minutes Intravenous Every 8 hours 04/08/17 2128     04/08/17 2115  vancomycin (VANCOCIN) IVPB 1000 mg/200 mL premix     1,000 mg 200 mL/hr over 60 Minutes Intravenous  Once 04/08/17 2104  04/08/17 2242   04/08/17 2115  piperacillin-tazobactam (ZOSYN) IVPB 3.375 g     3.375 g 100 mL/hr over 30 Minutes Intravenous  Once 04/08/17 2104 04/08/17 2156      Assessment/Plan:  Discussed care of sacral decubitus with bedside nurse and with charge nurse. I have asked them to call our group this morning when dressing changes and hydrotherapy to be done No plans for surgery at this point.   Consider debridement only for uncontrolled sepsis.  Primary team to address goals of care.   LOS: 5 days    Julia Floyd 04/13/2017

## 2017-04-13 NOTE — Progress Notes (Signed)
PROGRESS NOTE  Julia Floyd  IWO:[id number] DOB: June 23, 1930 DOA: 04/08/2017 PCP: Josetta Huddle, MD   Brief Narrative: Julia Floyd is an 84 y.o. female with a history of CVA/ICH s/p TPA with residual deficits, HTN, T2DM and sacral decubitus ulcer requiring debridement followed by hydrotherapy recently discharged 03/30/2017 to SNF who returned from SNF for fever and AMS, having decreased per oral intake. On arrival temperature was 99.13F, BP borderline hypotensive with improvement following IVF's. Lactate normal and hemoglobin down from recent discharge (7.4) at 6.5. CT of the head on admission shows no acute intracranial process there is moderate chronic small vessel ischemic disease and multifocal small old infarcts corresponding to hemorrhage on a prior CT.  Chest x-ray showed no active cardiopulmonary disease.  Two-view pelvic: Was nondiagnostic for sacral osteomyelitis secondary to overlapping calcified fibroids in bowel  gas.  There were no acute findings. IV fluids, vancomycin and zosyn were given empirically for osteomyelitis. Cultures drawn and 2u PRBCs provided. Admission to SDU and pt has continued to have recurrent fevers. ID consulted, felt fevers would continue despite adequate treatment. Pt not significantly improving, palliative care consulted for further goals of care discussions.   Assessment & Plan: Principal Problem:   Fever Active Problems:   Gait disturbance, post-stroke   Dysphagia   Alzheimer's dementia   Sacral decubitus ulcer   Sacral osteomyelitis (HCC)   Sepsis associated hypotension (HCC)   Anemia of chronic disease   Moderate protein-calorie malnutrition (HCC)  Sepsis likely due to osteomyelitis of sacrum: Lactate reassuring. Urine culture no growth. - Leukocytosis resolved. ID believes recurrent fevers are due to wound with very poor chance of healing, continuing vanc/zosyn for now.  - Monitor blood cultures, NGTD.  - Continue IVF's - Still full  code. Plan to discuss goals of care with patient's granddaughter.   Unstageable sacral pressure ulcer with osteomyelitis: s/p debridement 12/17 at recent admission. - Appreciate general surgery recommendations: Now undergoing hydrotherapy (1/10, 1/12, 1/14). No need for repeat debridement. - RD consulted, nutritional supplement as ordered. - Doubt this will ever heal.  Normocytic anemia of chronic disease: Ferritin 1,477.   - Hgb 6.5 on admission, up to 9.2 w/2u PRBCs. Monitoring CBC intermittently  Thrombocytopenia: Resolved, likely due to linezolid given at previous admission.  Hypokalemia: Improved with replacement.  Critical limb ischemia, PAD, complicated by bilateral heel pressure wounds: [name] 0.37 (R) and 0.28 (L).  - Consulted vascular surgery, Dr. Bridgett Larsson, during last admission 12/28, felt conservative measures appropriate.  - Wound care per WOC, floating heels.   History of T2DM: (last was 5.1%).  - Holding metformin  Hypertension: - Holding norvasc due to hypotension  History of CVA/ICH with dysphagia: Residual deficits have left her bedbound/wheelchair bound. - Dysphagia 2 diet  DVT prophylaxis: Lovenox Code Status: Full Family Communication: None at bedside; called granddaughter without answer today Disposition Plan: Transfer to floor once hypotension improving, pending goals of care discussion. Consultants: General surgery, ID, palliative care  Procedures:   None  Antimicrobials:  Vanc/zosyn 1/9 >>    Subjective: Fever again this morning. Denies pain. Not interactive.  Objective: Vitals:   04/13/17 0400 04/13/17 0500 04/13/17 0800 04/13/17 1200  BP: (!) 120/37     Pulse: 91 99    Resp: (!) 28 14    Temp:   99.5 F (37.5 C) 99 F (37.2 C)  TempSrc:   Oral Oral  SpO2: 99% 97%    Weight:      Height:  Intake/Output Summary (Last 24 hours) at 04/13/2017 1320 Last data filed at 04/13/2017 0600 Gross per 24 hour  Intake 1900 ml  Output  600 ml  Net 1300 ml   Filed Weights   04/08/17 1821  Weight: 78.5 kg (173 lb)   Gen: Elderly female in no distress resting quietly, withdrawn Pulm: Non-labored tachypnea. Clear to auscultation bilaterally.  CV: Regular rate and rhythm. No murmur, rub, or gallop. No JVD, no pedal edema. GI: Abdomen soft, non-tender, non-distended, with normoactive bowel sounds. No organomegaly or masses felt. Ext: Dry calcaneal ulcers bilaterally without fluctuance or purulence. Heel boots in place. Skin: Sacral ulcer as pictured in general surgery note 1/14 Neuro: Alert, not oriented, very meek phonation No new focal deficits.  Psych: Judgement and insight appear impaired. Withdrawn.   Data Reviewed: I have personally reviewed following labs and imaging studies  CBC: Recent Labs  Lab 04/08/17 1900 04/09/17 0527 04/10/17 0304 04/12/17 0334  WBC 9.9 11.4* 12.0* 9.6  NEUTROABS 6.6  --   --   --   HGB 6.5* 8.7* 9.2* 8.3*  HCT 19.9* 26.0* 27.8* 25.8*  MCV 80.2 82.0 81.5 83.2  PLT 235 206 232 347   Basic Metabolic Panel: Recent Labs  Lab 04/08/17 1900 04/09/17 0527 04/10/17 0304 04/11/17 0340 04/12/17 0334 04/13/17 0333  NA 134* 136 136  --  136 134*  K 3.7 3.6 3.6  --  3.3* 4.1  CL 104 108 108  --  108 108  CO2 24 21* 21*  --  21* 20*  GLUCOSE 129* 115* 97  --  82 72  BUN 21* 20 20  --  18 14  CREATININE 0.85 0.72 0.81 0.84 0.76 0.74  CALCIUM 8.2* 7.7* 8.0*  --  7.8* 7.7*   GFR: Estimated Creatinine Clearance: 55.4 mL/min (by C-G formula based on SCr of 0.74 mg/dL).   Liver Function Tests: Recent Labs  Lab 04/08/17 1900  AST 45*  ALT 12*  ALKPHOS 67  BILITOT 0.6  PROT 6.0*  ALBUMIN 1.8*   Coagulation Profile: Recent Labs  Lab 04/08/17 2115  INR 1.14    Urine analysis:    Component Value Date/Time   COLORURINE AMBER (A) 04/10/2017 0550   APPEARANCEUR CLEAR 04/10/2017 0550   LABSPEC 1.015 04/10/2017 0550   PHURINE 5.0 04/10/2017 0550   GLUCOSEU NEGATIVE  04/10/2017 0550   HGBUR NEGATIVE 04/10/2017 0550   BILIRUBINUR NEGATIVE 04/10/2017 0550   KETONESUR NEGATIVE 04/10/2017 0550   PROTEINUR NEGATIVE 04/10/2017 0550   NITRITE NEGATIVE 04/10/2017 0550   LEUKOCYTESUR NEGATIVE 04/10/2017 0550   Recent Results (from the past 240 hour(s))  Blood culture (routine x 2)     Status: None (Preliminary result)   Collection Time: 04/08/17  7:54 PM  Result Value Ref Range Status   Specimen Description BLOOD RIGHT ANTECUBITAL  Final   Special Requests   Final    BOTTLES DRAWN AEROBIC AND ANAEROBIC Blood Culture adequate volume   Culture   Final    NO GROWTH 4 DAYS Performed at [facility], [place] [address] [place], [place] [address]    Report Status PENDING  Incomplete  Blood culture (routine x 2)     Status: None (Preliminary result)   Collection Time: 04/08/17  7:56 PM  Result Value Ref Range Status   Specimen Description BLOOD LEFT ARM  Final   Special Requests   Final    BOTTLES DRAWN AEROBIC AND ANAEROBIC Blood Culture results may not be optimal due  to an inadequate volume of blood received in culture bottles   Culture   Final    NO GROWTH 4 DAYS Performed at [facility], [place] [address] [place], [place] [address]    Report Status PENDING  Incomplete  MRSA PCR Screening     Status: None   Collection Time: 04/10/17 12:45 AM  Result Value Ref Range Status   MRSA by PCR NEGATIVE NEGATIVE Final    Comment:        The GeneXpert MRSA Assay (FDA approved for NASAL specimens only), is one component of a comprehensive MRSA colonization surveillance program. It is not intended to diagnose MRSA infection nor to guide or monitor treatment for MRSA infections.   Culture, Urine     Status: None   Collection Time: 04/10/17  5:50 AM  Result Value Ref Range Status   Specimen Description URINE, CATHETERIZED  Final   Special Requests NONE  Final   Culture   Final    NO GROWTH Performed at [facility], [address],  [place], [place] [address]    Report Status 04/11/2017 FINAL  Final      Radiology Studies: No results found.  Scheduled Meds: . collagenase   Topical BID  . enoxaparin (LOVENOX) injection  40 mg Subcutaneous QHS  . feeding supplement (ENSURE ENLIVE)  237 mL Oral TID BM  . multivitamin with minerals  1 tablet Oral Daily  . nutrition supplement (JUVEN)  1 packet Oral BID BM   Continuous Infusions: . 0.45 % NaCl with KCl 20 mEq / L 100 mL/hr at 04/13/17 0300  . piperacillin-tazobactam (ZOSYN)  IV 3.375 g (04/13/17 1206)  . vancomycin Stopped (04/13/17 1039)     LOS: 5 days   Time spent: 25 minutes.  Vance Gather, MD [facility] Pager [phone]  If 7PM-7AM, please contact night-coverage [email or web address] Password [email or web address] 04/13/2017, 1:20 PM

## 2017-04-13 NOTE — Progress Notes (Signed)
Interim progress note:   Patient's daughter at the bedside at the time of my reevaluation today. I took the opportunity to share my concern that Julia Floyd is not showing meaningful improvement in mental status despite aggressive measures for 5 days. This in addition to her very poor functional status portend a guarded prognosis. We discussed that her current quality of life is minimal and if this continues despite our aggressive measures, I would recommend going forward with hospice/palliative care discussions. This was met with understanding, though did seem to take her daughter by some surprise. Pt had eyes closed throughout conversation, did not speak.   Plan: Continue full code for now, revisit goals of care discussions over the next 1 - 2 days pending clinical progress.   Vance Gather, MD 04/13/2017 3:44 PM

## 2017-04-13 NOTE — Progress Notes (Signed)
   04/10/17 1707  Advance Directives (For Healthcare)  Does Patient Have a Medical Advance Directive? Yes  Type of Paramedic of [place];Living will  Copy of [place] in Chart? Yes  Copy of Living Will in Chart? Yes    Chaplain paged by RN to assist family with advance directives.  Noted pt's history of dementia and AMS on this admission.  Met with family and patient.  Pt was able to state name, birthday,.location ([place]), and describe documents.  Chaplain went over document with pt, who indicated that she wished Paulette and Neecy Nanda Quinton) to make decisions for her if she was not able.  Pt again stated this in front of witnesses.   Chaplain notarized documents.  Family with original and copy.  Copy placed in chart.    WL / [place], MDiv

## 2017-04-13 NOTE — Progress Notes (Signed)
Date:  April 13, 2017 Chart reviewed for concurrent status and case management needs.  Will continue to follow patient progress. Remains lethargic, sarcal wound , to begin hydrotherapy, continues on iv abx due to sepsis. Discharge Planning: following for needs.  None present at this time of review. Expected discharge date: January 172019 Cosmo Tetreault, BSN, [place], [place]

## 2017-04-13 NOTE — Progress Notes (Signed)
HYDROTHERAPY EVALUATION    04/13/17 1100  Subjective Assessment  Subjective pt did not speak during session  Patient and Family Stated Goals none stated  Date of Onset (unknown. POA on 03/14/17)  Prior Treatments hydrotherapy initiated 03/17/17  Evaluation and Treatment  Evaluation and Treatment Procedures Explained to Patient/Family Yes  Evaluation and Treatment Procedures Patient unable to consent due to mental status  Pressure Injury 03/17/17 Stage IV - Full thickness tissue loss with exposed bone, tendon or muscle. *PT*  S/P I&D 03/16/17  Date First Assessed/Time First Assessed: 03/17/17 1340   Location: Sacrum  Staging: Stage IV - Full thickness tissue loss with exposed bone, tendon or muscle.  Wound Description (Comments): *PT*  S/P I&D 03/16/17  Present on Admission: Yes  Dressing Type ABD;Gauze (Comment);Barrier Film (skin prep);Moist to dry (Santyl)  Dressing Changed  Dressing Change Frequency Twice a day  State of Healing Non-healing  Site / Wound Assessment Pink;Yellow;Brown  % Wound base Red or Granulating 60%  % Wound base Yellow/Fibrinous Exudate 40%  Peri-wound Assessment Maceration;Excoriated;Bleeding;Black  Margins Unattached edges (unapproximated)  Drainage Amount Moderate  Drainage Description Serosanguineous  Treatment Debridement (Selective);Hydrotherapy (Pulse lavage);Packing (Saline gauze) (Santyl)  Pressure Injury 03/17/17 Unstageable - Full thickness tissue loss in which the base of the ulcer is covered by slough (yellow, tan, gray, green or brown) and/or eschar (tan, brown or black) in the wound bed. *PT*  Date First Assessed/Time First Assessed: 03/17/17 1340   Location: Ischial tuberosity  Location Orientation: Left  Staging: Unstageable - Full thickness tissue loss in which the base of the ulcer is covered by slough (yellow, tan, gray, green or brown...  Dressing Type Gauze (Comment);Moist to dry  Dressing Changed  Dressing Change Frequency Twice a day   State of Healing Early/partial granulation  Site / Wound Assessment Pink;Yellow  % Wound base Red or Granulating 90%  % Wound base Yellow/Fibrinous Exudate 10%  Peri-wound Assessment Pink;Maceration  Drainage Amount Minimal  Drainage Description Serosanguineous  Treatment Packing (Saline gauze) dressing changed (comes off when removing sacral dressing)  Hydrotherapy  Pulsed Lavage with Suction (psi) 8 psi  Pulsed Lavage with Suction - Normal Saline Used 1000 mL  Pulsed Lavage Tip Tip with splash shield  Pulsed lavage therapy - wound location sacral  Selective Debridement  Selective Debridement - Location sacrum  Selective Debridement - Tools Used Forceps;Scissors  Selective Debridement - Tissue Removed brown and yellow tissue  Wound Therapy - Assess/Plan/Recommendations  Wound Therapy - Clinical Statement 84 yo female admitted from SNF with multiple wounds including sacral wound found to have osteomyelitis. S/P Incision and Drainage 03/16/17. Hx CVA-primarily bedbound.   Wound Therapy - Functional Problem List Limited mobility due to hx of CVA and recent fall in 09/2016 (per pt). Long periods of time spent sitting in wheelchair without pressure redistribution  Factors Delaying/Impairing Wound Healing Diabetes Mellitus;Incontinence;Immobility;Infection - systemic/local  Hydrotherapy Plan Debridement;Dressing change;Patient/family education;Pulsatile lavage with suction  Wound Therapy - Frequency 6X / week  Wound Therapy - Current Recommendations Case manager/social work  Wound Therapy - Follow Up Recommendations Skilled nursing facility  Wound Plan hydrotherapy to sacral wound. Wound appears worse on today compared to evaluation. Also noted periwound skin issues-see note.   Wound Therapy Goals - Improve the function of patient's integumentary system by progressing the wound(s) through the phases of wound healing by:  Decrease Necrotic Tissue to 20  Decrease Necrotic Tissue - Progress  Progressing toward goal  Increase Granulation Tissue to 80  Increase Granulation Tissue -  Progress Progressing toward goal  Goals/treatment plan/discharge plan were made with and agreed upon by patient/family No, Patient unable to participate in goals/treatment/discharge plan and family unavailable  Time For Goal Achievement 2 weeks  Wound Therapy - Potential for Goals Poor   Weston Anna, MPT [phone]

## 2017-04-14 ENCOUNTER — Other Ambulatory Visit: Payer: Self-pay

## 2017-04-14 LAB — BASIC METABOLIC PANEL
ANION GAP: 4 — AB (ref 5–15)
BUN: 14 mg/dL (ref 6–20)
CO2: 20 mmol/L — AB (ref 22–32)
Calcium: 7.5 mg/dL — ABNORMAL LOW (ref 8.9–10.3)
Chloride: 111 mmol/L (ref 101–111)
Creatinine, Ser: 0.82 mg/dL (ref 0.44–1.00)
GFR calc Af Amer: >60 mL/min (ref >60)
GFR calc non Af Amer: >60 mL/min (ref >60)
GLUCOSE: 86 mg/dL (ref 65–99)
POTASSIUM: 4.3 mmol/L (ref 3.5–5.1)
Sodium: 135 mmol/L (ref 135–145)

## 2017-04-14 MED ORDER — RESOURCE THICKENUP CLEAR PO POWD
ORAL | Status: DC | PRN
  Filled 2017-04-14 (×2): qty 125

## 2017-04-14 MED ORDER — SODIUM CHLORIDE 0.9 % IV BOLUS (SEPSIS)
1000.0000 mL | Freq: Once | INTRAVENOUS | Status: AC
  Administered 2017-04-14: 1000 mL via INTRAVENOUS

## 2017-04-14 MED ORDER — ENSURE ENLIVE PO LIQD
237.0000 mL | Freq: Two times a day (BID) | ORAL | Status: DC
  Administered 2017-04-14 – 2017-04-21 (×7): 237 mL via ORAL

## 2017-04-14 NOTE — Progress Notes (Signed)
Nutrition Follow-up  DOCUMENTATION CODES:   Not applicable  INTERVENTION:  - Will decrease Ensure Enlive from TID to BID, each supplement provides 350 kcal and 20 grams of protein. - Will order Magic Cup TID with meals, each supplement provides 290 kcal and 9 grams of protein. - Continue Juven BID, each packet provides 80 kcal and 14 grams of amino acids.  - Continue to encourage PO intakes when pt is alert/awake enough to safely consume items.  - Will continue to monitor GOC/POC.  - Will order for new weight to be obtained today.   NUTRITION DIAGNOSIS:   Increased nutrient needs related to wound healing as evidenced by estimated needs. -ongoing  GOAL:   Patient will meet greater than or equal to 90% of their needs -likely unmet  MONITOR:   PO intake, Supplement acceptance, Weight trends, Labs, Skin, Other (Comment)(GOC/POC)  REASON FOR ASSESSMENT:   Consult Wound healing, Poor PO  ASSESSMENT:   84 y.o. female with a history of CVA/ICH s/p TPA with residual deficits, HTN, T2DM and sacral decubitus ulcer requiring debridement followed by hydrotherapy recently discharged 03/30/2017 to SNF who returned from SNF for fever and AMS, having decreased per oral intake.   1/15 No intakes documented since last assessment. Per Dr. Hadley Floyd note yesterday at 1:20 PM, "pt has continuedt o have recurrent fevers. ID consulted, felt fevers would continue despite adequate treatment. Pt not significantly improving, palliative care consulted for further goals of care discussions." His note at 3:32 PM states "not showing meaningful improvement in mental status despite aggressive measures for 5 days. This in addition to her very poor functional status and guarded prognosis." Plan at that time was for pt to remain full code but to revisit Julia Floyd in the following 1-2 days (today versus tomorrow). RNs working with pt this AM, attempting to turn/clean wounds. Pt mainly unresponsive this AM except when attempts  made to move her. No new weight since 1/9.   Medications reviewed; daily multivitamin with minerals.  Labs reviewed; Ca: 7.5 mg/dL.  IVF: NS-10 mEq IV KCl @ 100 mL/hr.     1/11 - No intakes documented since admission.  - Pt very drowsy at time of RD visit and was unable to keep eyes open, did not provide any responses.  - No family/visitors present at this time.  - Pt was last seen by an RD on 03/25/17.  - At that time, pt reported having a "fair" appetite and was eating an average of 25% at meals.  - Based on documentation from that time, it appears pt has had worsening in wounds.          Diet Order:  DIET DYS 2 Room service appropriate? Yes; Fluid consistency: Thin  EDUCATION NEEDS:   No education needs have been identified at this time  Skin:  Skin Integrity Issues:: Stage IV, Stage II, Unstageable, DTI, Other (Comment) DTI: L heel Stage II: L buttocks Stage IV: sacrum Unstageable: (full thickness) bilateral feet Other: non-pressure injuries to L leg, R heel, R foot  Last BM:  1/14  Height:   Ht Readings from Last 1 Encounters:  04/10/17 5\' 6"  (1.676 m)    Weight:   Wt Readings from Last 1 Encounters:  04/08/17 173 lb (78.5 kg)    Ideal Body Weight:  59.09 kg  BMI:  Body mass index is 27.92 kg/m.  Estimated Nutritional Needs:   Kcal:  2200-2355 (28-30 kcal/kg)  Protein:  118-133 grams (1.5-1.7 grams/kg)  Fluid:  >/= 2.2  L/day      Julia Matin, MS, RD, LDN, [facility] Inpatient Clinical Dietitian Pager # [phone] After hours/weekend pager # [phone]

## 2017-04-14 NOTE — Progress Notes (Signed)
HYDROTHERAPY TREATMENT     04/14/17 1200  Subjective Assessment  Subjective "no" when asked if she realized her daughter was here.  Patient and Family Stated Goals none stated  Date of Onset (unknown-POA on 03/14/17)  Prior Treatments hydrotherapy initiated 03/17/17  Evaluation and Treatment  Evaluation and Treatment Procedures Explained to Patient/Family Yes  Evaluation and Treatment Procedures Patient unable to consent due to mental status  Pressure Injury 03/17/17 Stage IV - Full thickness tissue loss with exposed bone, tendon or muscle. *PT*  S/P I&D 03/16/17  Date First Assessed/Time First Assessed: 03/17/17 1340   Location: Sacrum  Staging: Stage IV - Full thickness tissue loss with exposed bone, tendon or muscle.  Wound Description (Comments): *PT*  S/P I&D 03/16/17  Present on Admission: Yes  Dressing Type ABD;Barrier Film (skin prep);Gauze (Comment);Moist to dry (Santyl)  Dressing Changed  Dressing Change Frequency Twice a day  State of Healing Non-healing  Site / Wound Assessment Pink;Yellow;Brown  % Wound base Red or Granulating 60%  % Wound base Yellow/Fibrinous Exudate 40%  Peri-wound Assessment Maceration;Excoriated;Bleeding;Black  Margins Unattached edges (unapproximated)  Drainage Amount Moderate  Drainage Description Serosanguineous  Treatment Debridement (Selective);Hydrotherapy (Pulse lavage);Packing (Saline gauze) (Santyl)  Pressure Injury 03/17/17 Unstageable - Full thickness tissue loss in which the base of the ulcer is covered by slough (yellow, tan, gray, green or brown) and/or eschar (tan, brown or black) in the wound bed. *PT*  Date First Assessed/Time First Assessed: 03/17/17 1340   Location: Ischial tuberosity  Location Orientation: Left  Staging: Unstageable - Full thickness tissue loss in which the base of the ulcer is covered by slough (yellow, tan, gray, green or brown...  Treatment (replaced dressing. Tends to come off when removing sacral dressing)   Hydrotherapy  Pulsed Lavage with Suction (psi) 8 psi  Pulsed Lavage with Suction - Normal Saline Used 1000 mL  Pulsed Lavage Tip Tip with splash shield  Pulsed lavage therapy - wound location sacral  Selective Debridement  Selective Debridement - Location sacrum  Selective Debridement - Tools Used Forceps;Scissors  Selective Debridement - Tissue Removed brown and yellow tissue  Wound Therapy - Assess/Plan/Recommendations  Wound Therapy - Clinical Statement 84 yo female admitted from SNF with multiple wounds including sacral wound found to have osteomyelitis. S/P Incision and Drainage 03/16/17. Hx CVA-primarily bedbound.   Wound Therapy - Functional Problem List Limited mobility due to hx of CVA and recent fall in 09/2016 (per pt). Long periods of time spent sitting in wheelchair without pressure redistribution  Factors Delaying/Impairing Wound Healing Diabetes Mellitus;Incontinence;Immobility;Infection - systemic/local  Hydrotherapy Plan Debridement;Dressing change;Patient/family education;Pulsatile lavage with suction  Wound Therapy - Frequency 6X / week  Wound Therapy - Current Recommendations Case manager/social work  Wound Therapy - Follow Up Recommendations Skilled nursing facility  Wound Plan hydrotherapy to sacral wound. Wound appears worse on today compared to evaluation. Also noted periwound skin issues-see note.  Wound Therapy Goals - Improve the function of patient's integumentary system by progressing the wound(s) through the phases of wound healing by:  Decrease Necrotic Tissue to 20  Decrease Necrotic Tissue - Progress Progressing toward goal  Increase Granulation Tissue to 80  Increase Granulation Tissue - Progress Progressing toward goal  Goals/treatment plan/discharge plan were made with and agreed upon by patient/family No, Patient unable to participate in goals/treatment/discharge plan and family unavailable  Time For Goal Achievement 2 weeks  Wound Therapy - Potential  for Goals Poor   Weston Anna, MPT [phone]

## 2017-04-14 NOTE — Progress Notes (Signed)
PROGRESS NOTE  Julia Floyd  WNU:[id number] DOB: 1931/03/22 DOA: 04/08/2017 PCP: Josetta Huddle, MD   Brief Narrative: Julia Floyd is an 84 y.o. female with a history of CVA/ICH s/p TPA with residual deficits, HTN, T2DM and sacral decubitus ulcer requiring debridement followed by hydrotherapy recently discharged 03/30/2017 to SNF who returned from SNF for fever and AMS, having decreased per oral intake. On arrival temperature was 99.22F, BP borderline hypotensive with improvement following IVF's. Lactate normal and hemoglobin down from recent discharge (7.4) at 6.5. CT of the head on admission shows no acute intracranial process there is moderate chronic small vessel ischemic disease and multifocal small old infarcts corresponding to hemorrhage on a prior CT.  Chest x-ray showed no active cardiopulmonary disease.  Two-view pelvic: Was nondiagnostic for sacral osteomyelitis secondary to overlapping calcified fibroids in bowel  gas.  There were no acute findings. IV fluids, vancomycin and zosyn were given empirically for osteomyelitis. Cultures drawn and 2u PRBCs provided. Admission to SDU and pt has continued to have recurrent fevers. ID consulted, felt fevers would continue despite adequate treatment. Pt not significantly improving, palliative care consulted for further goals of care discussions.   Assessment & Plan: Principal Problem:   Fever Active Problems:   Gait disturbance, post-stroke   Dysphagia   Alzheimer's dementia   Sacral decubitus ulcer   Sacral osteomyelitis (HCC)   Sepsis associated hypotension (HCC)   Anemia of chronic disease   Moderate protein-calorie malnutrition (HCC)  Sepsis due to osteomyelitis of sacrum: Lactate reassuring. Urine culture no growth. - ID believes recurrent fevers are due to wound with very poor chance of healing, continuing vanc/zosyn for now. Still having fevers and decompensation in vital signs. - Monitor blood cultures, NGTD.  - Continue  IVF's. Worsening hypotension this AM, will give NS bolus.  - Still full code. I've discussed in detail with pt's daughter who currently still desires full code and ongoing management. Discussed with palliative care, Dr. Domingo Cocking, as I suspect a family meeting including daughter and granddaughter will be required if the patient continues to decline.   Unstageable sacral pressure ulcer with osteomyelitis: s/p debridement 12/17 at recent admission. - Appreciate general surgery recommendations: Now undergoing hydrotherapy (1/10, 1/12, 1/14). No need for repeat debridement. - RD consulted, nutritional supplement as ordered. - Doubt this will ever heal.  Normocytic anemia of chronic disease: Ferritin 1,477.   - Hgb 6.5 on admission, up to 9.2 w/2u PRBCs. Monitoring CBC intermittently  Thrombocytopenia: Resolved, likely due to linezolid given at previous admission.  Hypokalemia: Improved with replacement.  Critical limb ischemia, PAD, complicated by bilateral heel pressure wounds: [name] 0.37 (R) and 0.28 (L).  - Consulted vascular surgery, Dr. Bridgett Larsson, during last admission 12/28, felt conservative measures appropriate.  - Wound care per WOC, floating heels.   History of T2DM: (last was 5.1%).  - Holding metformin  Hypertension: - Holding norvasc due to hypotension  History of CVA/ICH with dysphagia: Residual deficits have left her bedbound/wheelchair bound. - Dysphagia 2 diet  DVT prophylaxis: Lovenox Code Status: Full Family Communication: None at bedside; called granddaughter without answer again today Disposition Plan: Uncertain, pending goals of care discussion. Consultants: General surgery, ID, palliative care  Procedures:   None  Antimicrobials:  Vanc/zosyn 1/9 >>    Subjective: Fevers continue, eating very poorly. Lethargic this morning.   Objective: Vitals:   04/14/17 0353 04/14/17 0800 04/14/17 0900 04/14/17 1000  BP:  (!) 94/23  (!) 114/41  Pulse:  88 86 84  Resp:  (!) 22 (!) 22 (!) 22  Temp: (!) 101.5 F (38.6 C) 99.6 F (37.6 C)    TempSrc: Axillary Oral    SpO2:  100% 100% 100%  Weight:      Height:        Intake/Output Summary (Last 24 hours) at 04/14/2017 1324 Last data filed at 04/14/2017 1100 Gross per 24 hour  Intake 2836.67 ml  Output 670 ml  Net 2166.67 ml   Filed Weights   04/08/17 1821  Weight: 78.5 kg (173 lb)   Gen: Elderly female in no distress lethargic Pulm: Non-labored, normal rate, 100%, clear.  CV: Regular rate and rhythm. No murmur, rub, or gallop. No JVD, no edema at all. GI: Abdomen soft, non-tender, non-distended, with normoactive bowel sounds. No organomegaly or masses felt. Ext: Dry calcaneal ulcers bilaterally without fluctuance or purulence. Heel boots in place. Skin: Sacral ulcer as pictured in general surgery note 1/14. Foam pad dressing in place. Neuro: lethargic, not oriented. No focal deficits on exam limited by diffuse weakness. Psych: Judgement and insight appear impaired. Withdrawn.   Data Reviewed: I have personally reviewed following labs and imaging studies  CBC: Recent Labs  Lab 04/08/17 1900 04/09/17 0527 04/10/17 0304 04/12/17 0334  WBC 9.9 11.4* 12.0* 9.6  NEUTROABS 6.6  --   --   --   HGB 6.5* 8.7* 9.2* 8.3*  HCT 19.9* 26.0* 27.8* 25.8*  MCV 80.2 82.0 81.5 83.2  PLT 235 206 232 093   Basic Metabolic Panel: Recent Labs  Lab 04/09/17 0527 04/10/17 0304 04/11/17 0340 04/12/17 0334 04/13/17 0333 04/14/17 0314  NA 136 136  --  136 134* 135  K 3.6 3.6  --  3.3* 4.1 4.3  CL 108 108  --  108 108 111  CO2 21* 21*  --  21* 20* 20*  GLUCOSE 115* 97  --  82 72 86  BUN 20 20  --  18 14 14   CREATININE 0.72 0.81 0.84 0.76 0.74 0.82  CALCIUM 7.7* 8.0*  --  7.8* 7.7* 7.5*   GFR: Estimated Creatinine Clearance: 54 mL/min (by C-G formula based on SCr of 0.82 mg/dL).   Liver Function Tests: Recent Labs  Lab 04/08/17 1900  AST 45*  ALT 12*  ALKPHOS 67  BILITOT 0.6  PROT  6.0*  ALBUMIN 1.8*   Coagulation Profile: Recent Labs  Lab 04/08/17 2115  INR 1.14    Urine analysis:    Component Value Date/Time   COLORURINE AMBER (A) 04/10/2017 0550   APPEARANCEUR CLEAR 04/10/2017 0550   LABSPEC 1.015 04/10/2017 0550   PHURINE 5.0 04/10/2017 0550   GLUCOSEU NEGATIVE 04/10/2017 0550   HGBUR NEGATIVE 04/10/2017 0550   BILIRUBINUR NEGATIVE 04/10/2017 0550   KETONESUR NEGATIVE 04/10/2017 0550   PROTEINUR NEGATIVE 04/10/2017 0550   NITRITE NEGATIVE 04/10/2017 0550   LEUKOCYTESUR NEGATIVE 04/10/2017 0550   Recent Results (from the past 240 hour(s))  Blood culture (routine x 2)     Status: None   Collection Time: 04/08/17  7:54 PM  Result Value Ref Range Status   Specimen Description BLOOD RIGHT ANTECUBITAL  Final   Special Requests   Final    BOTTLES DRAWN AEROBIC AND ANAEROBIC Blood Culture adequate volume   Culture   Final    NO GROWTH 5 DAYS Performed at [facility], [place] [address] [place], [place] [address]    Report Status 04/13/2017 FINAL  Final  Blood culture (routine x 2)  Status: None   Collection Time: 04/08/17  7:56 PM  Result Value Ref Range Status   Specimen Description BLOOD LEFT ARM  Final   Special Requests   Final    BOTTLES DRAWN AEROBIC AND ANAEROBIC Blood Culture results may not be optimal due to an inadequate volume of blood received in culture bottles   Culture   Final    NO GROWTH 5 DAYS Performed at [facility], [place] [address] [place], [place] [address]    Report Status 04/13/2017 FINAL  Final  MRSA PCR Screening     Status: None   Collection Time: 04/10/17 12:45 AM  Result Value Ref Range Status   MRSA by PCR NEGATIVE NEGATIVE Final    Comment:        The GeneXpert MRSA Assay (FDA approved for NASAL specimens only), is one component of a comprehensive MRSA colonization surveillance program. It is not intended to diagnose MRSA infection nor to guide or monitor treatment for MRSA infections.    Culture, Urine     Status: None   Collection Time: 04/10/17  5:50 AM  Result Value Ref Range Status   Specimen Description URINE, CATHETERIZED  Final   Special Requests NONE  Final   Culture   Final    NO GROWTH Performed at [facility], [address], [place], [place] [address]    Report Status 04/11/2017 FINAL  Final      Radiology Studies: No results found.  Scheduled Meds: . collagenase   Topical BID  . enoxaparin (LOVENOX) injection  40 mg Subcutaneous QHS  . feeding supplement (ENSURE ENLIVE)  237 mL Oral BID BM  . multivitamin with minerals  1 tablet Oral Daily  . nutrition supplement (JUVEN)  1 packet Oral BID BM   Continuous Infusions: . piperacillin-tazobactam (ZOSYN)  IV 3.375 g (04/14/17 1236)  . 0.9 % sodium chloride with kcl 100 mL/hr at 04/14/17 0800  . vancomycin Stopped (04/14/17 1100)     LOS: 6 days   Time spent: 25 minutes.  Vance Gather, MD [facility] Pager [phone]  If 7PM-7AM, please contact night-coverage [email or web address] Password [email or web address] 04/14/2017, 1:24 PM

## 2017-04-14 NOTE — Progress Notes (Signed)
Pharmacy Antibiotic Note  Julia Floyd is a 84 y.o. female admitted on 04/08/2017 with sepsis, recurrent fevers, sacral osteomyelitis.  Pharmacy has been consulted for vancomycin and zosyn dosing.  Plan:  Continue Vancomycin 1g IV q24h for goal AUC 400-500.   Continue Zosyn 3.375g IV q8h (4 hour infusion time).   Daily SCr.  Obtain vancomycin peak and trough levels for AUC calculations.  F/u LOT  Height: 5\' 6"  (167.6 cm) Weight: 173 lb (78.5 kg) IBW/kg (Calculated) : 59.3  Temp (24hrs), Avg:100.3 F (37.9 C), Min:99.2 F (37.3 C), Max:101.5 F (38.6 C)  Recent Labs  Lab 04/08/17 1900 04/08/17 1909 04/08/17 2115 04/08/17 2129 04/09/17 0527 04/10/17 0304 04/11/17 0340 04/12/17 0334 04/13/17 0333 04/14/17 0314  WBC 9.9  --   --   --  11.4* 12.0*  --  9.6  --   --   CREATININE 0.85  --   --   --  0.72 0.81 0.84 0.76 0.74 0.82  LATICACIDVEN  --  1.01 1.1 1.06 1.0  --   --   --   --   --     Estimated Creatinine Clearance: 54 mL/min (by C-G formula based on SCr of 0.82 mg/dL).    No Known Allergies  Antimicrobials this admission: 1/9 Vanc >>  1/9 Zosyn >>   Dose adjustments this admission: 1/16 1000 Vanc peak  1/17 0730 Vanc trough   Microbiology results: 1/9BCx: NGF 1/11 UCx: NGF 1/11 MRSA PCR: neg  Thank you for allowing pharmacy to be a part of this patient's care.  Gretta Arab PharmD, BCPS Pager [phone] 04/14/2017 2:13 PM

## 2017-04-15 DIAGNOSIS — Z515 Encounter for palliative care: Secondary | ICD-10-CM

## 2017-04-15 DIAGNOSIS — Z7189 Other specified counseling: Secondary | ICD-10-CM

## 2017-04-15 LAB — BASIC METABOLIC PANEL
ANION GAP: 4 — AB (ref 5–15)
BUN: 12 mg/dL (ref 6–20)
CO2: 18 mmol/L — AB (ref 22–32)
Calcium: 7.7 mg/dL — ABNORMAL LOW (ref 8.9–10.3)
Chloride: 115 mmol/L — ABNORMAL HIGH (ref 101–111)
Creatinine, Ser: 0.87 mg/dL (ref 0.44–1.00)
GFR, EST NON AFRICAN AMERICAN: 59 mL/min — AB (ref >60)
Glucose, Bld: 86 mg/dL (ref 65–99)
POTASSIUM: 4.1 mmol/L (ref 3.5–5.1)
Sodium: 137 mmol/L (ref 135–145)

## 2017-04-15 LAB — VANCOMYCIN, PEAK: Vancomycin Pk: 40 ug/mL (ref 30–40)

## 2017-04-15 MED ORDER — ALBUMIN HUMAN 25 % IV SOLN
25.0000 g | Freq: Four times a day (QID) | INTRAVENOUS | Status: AC
  Administered 2017-04-15 – 2017-04-16 (×4): 25 g via INTRAVENOUS
  Filled 2017-04-15 (×3): qty 50
  Filled 2017-04-15 (×2): qty 100

## 2017-04-15 NOTE — Progress Notes (Signed)
Patient has been sleepy today. Has only had few sips of water.Temp was 100.4 axillary at 12noon. Tylenol given rectally as ordered.Hands are swollen today. Patients daughter requests to talk with doctor. Dr. [place] Crane notifed.

## 2017-04-15 NOTE — NC FL2 (Signed)
[place] LEVEL OF CARE SCREENING TOOL     IDENTIFICATION  Patient Name: Julia Floyd Birthdate: [birth year] Sex: female Admission Date (Current Location): 04/08/2017  [facility] and [place] Number:  Herbalist and Address:  [facility],  [place], [place]      Provider Number: [id number]  Attending Physician Name and Address:  Kayleen Memos, DO  Relative Name and Phone Number:       Current Level of Care: Hospital Recommended Level of Care: [place] Prior Approval Number:    Date Approved/Denied:   PASRR Number:   [id number]  Discharge Plan: SNF    Current Diagnoses: Patient Active Problem List   Diagnosis Date Noted  . Fever 04/12/2017  . Moderate protein-calorie malnutrition ([place]) 04/12/2017  . Sepsis associated hypotension ([place]) 04/08/2017  . Anemia of chronic disease 04/08/2017  . Encounter for palliative care   . Goals of care, counseling/discussion   . Peripheral arterial disease ([place]) 03/18/2017  . Sacral osteomyelitis ([place]) 03/17/2017  . Open wound of right heel 03/17/2017  . Aortic atherosclerosis ([place]) 03/17/2017  . Sacral decubitus ulcer 03/14/2017  . Normochromic normocytic anemia 03/14/2017  . Closed trimalleolar fracture, right, initial encounter 02/05/2017  . Closed dislocation of right ankle 01/14/2017  . Dysphagia 01/14/2017  . History of CVA (cerebrovascular accident) 01/14/2017  . Acute urinary retention 01/14/2017  . Alzheimer's dementia 01/14/2017  . Benign essential HTN   . Neuropathic pain   . Urinary incontinence   . Constipation   . Diabetes mellitus type 2 in nonobese (HCC)   . Acute blood loss anemia   . Gait disturbance, post-stroke   . Intraparenchymal hemorrhage of brain ([place]) 10/14/2016  . Received tissue plasminogen activator (t-PA) less than 24 hours prior to arrival 10/09/2016  . ICH (intracerebral hemorrhage) ([place]) 10/09/2016  . Cytotoxic brain  edema ([place]) 10/09/2016  . Stroke (cerebrum) ([place]) -  Suspect stroke/TIA s/p tPA with multifocal ICH - etiology unclear, concerning for underlying CAA 10/07/2016  . Diabetes ([place])   . Depression   . DDD (degenerative disc disease)   . Uterine fibroid   . Trigeminal neuralgia   . Sixth cranial nerve palsy     Orientation RESPIRATION BLADDER Height & Weight     Self, Time, Place, Situation    External catheter Weight: 173 lb (78.5 kg) Height:  5\' 6"  (167.6 cm)  BEHAVIORAL SYMPTOMS/MOOD NEUROLOGICAL BOWEL NUTRITION STATUS      Incontinent Diet(See Discharge Summary)  AMBULATORY STATUS COMMUNICATION OF NEEDS Skin   Total Care Verbally PU Stage and Appropriate Care, Hydro Therapy PU Stage 1 Dressing: BID(Location: Ischial tuberosity  Location Orientation: Left  Staging: Unstageable - Full thickness tissue loss in which the base of the ulcer is covered by slough (yellow, tan, gray, green or brown...)                     Personal Care Assistance Level of Assistance  Total care           Functional Limitations Info  Sight, Hearing, Speech Sight Info: Adequate Hearing Info: Adequate Speech Info: Adequate    SPECIAL CARE FACTORS FREQUENCY  PT (By licensed PT), OT (By licensed OT)     PT Frequency: 6x/week OT Frequency: 6x/week            Contractures      Additional Factors Info  Code Status, Allergies Code Status Info: Fullcode  Allergies Info: NKA  Current Medications (04/15/2017):  This is the current hospital active medication list Current Facility-Administered Medications  Medication Dose Route Frequency Provider Last Rate Last Dose  . acetaminophen (TYLENOL) tablet 650 mg  650 mg Oral Q6H PRN Etta Quill, DO   650 mg at 04/09/17 1856   Or  . acetaminophen (TYLENOL) suppository 650 mg  650 mg Rectal Q6H PRN Etta Quill, DO   650 mg at 04/15/17 1157  . collagenase (SANTYL) ointment   Topical BID Vance Gather B, MD      . enoxaparin  (LOVENOX) injection 40 mg  40 mg Subcutaneous QHS Jennette Kettle M, DO   40 mg at 04/14/17 2127  . feeding supplement (ENSURE ENLIVE) (ENSURE ENLIVE) liquid 237 mL  237 mL Oral BID BM Patrecia Pour, MD   237 mL at 04/14/17 1500  . multivitamin with minerals tablet 1 tablet  1 tablet Oral Daily Patrecia Pour, MD   Stopped at 04/14/17 0945  . nutrition supplement (JUVEN) (JUVEN) powder packet 1 packet  1 packet Oral BID BM Patrecia Pour, MD   Stopped at 04/14/17 0945  . ondansetron (ZOFRAN) tablet 4 mg  4 mg Oral Q6H PRN Etta Quill, DO       Or  . ondansetron [facility]) injection 4 mg  4 mg Intravenous Q6H PRN Etta Quill, DO      . piperacillin-tazobactam (ZOSYN) IVPB 3.375 g  3.375 g Intravenous Q8H Dara Hoyer, RPH 12.5 mL/hr at 04/15/17 1157 3.375 g at 04/15/17 1157  . [place]   Oral PRN Josetta Huddle, MD      . sodium chloride 0.9 % 1,000 mL with potassium chloride 10 mEq infusion   Intravenous Continuous Patrecia Pour, MD 100 mL/hr at 04/15/17 1158    . vancomycin (VANCOCIN) IVPB 1000 mg/200 mL premix  1,000 mg Intravenous Q24H Dara Hoyer, [facility]   Stopped at 04/15/17 1002     Discharge Medications: Please see discharge summary for a list of discharge medications.  Relevant Imaging Results:  Relevant Lab Results:   Additional Information SSN- [id number]  Lia Hopping, LCSW

## 2017-04-15 NOTE — Progress Notes (Signed)
Palliative care progress note  Reason for consult: Goals of care in light of continued infection related to multiple wounds.  Patient known to me from prior admission.  Chart reviewed and discussed with bedside RN.  I saw and examined Julia Floyd and there is a significant change from when I met with her during prior admission.  She is lying in bed and does not respond to verbal or tactile stimulation.  Her granddaughter was at the bedside.  Her granddaughter and I discussed the significant change that we have seen since her recent admission in December.  She states that since she has been at skilled facility, her grandmother has continued to struggle with needing to get hydrotherapy.  Julia Floyd reports that she understands that her grandmother is critically ill and that she is going to continue to decline.  She is surprised at how quickly this is occurred.  She reports that the rest of her family, including her aunt, Julia Floyd, is really struggling with the fact that she is not improving despite aggressive therapies.  We reviewed prior conversations about the things and are most important to Julia Floyd and concern that she is not going to improve (at least not long-term) regardless of interventions due to the fact that she has a non-healable wound that will continue to be source of infection even if she continues on long course of antibiotics.  Her granddaughter reports that she will discuss further with her family is in agreement with meeting again with me tomorrow to continue conversation.  Total time: 30 minutes Greater than 50%  of this time was spent counseling and coordinating care related to the above assessment and plan.  Micheline Rough, MD [place] Team [phone]

## 2017-04-15 NOTE — Clinical Social Work Note (Signed)
Clinical Social Work Assessment  Patient Details  Name: Julia Floyd MRN: [id number] Date of Birth: 08/01/1930  Date of referral:  04/15/17               Reason for consult:  Facility Placement, Discharge Planning                Permission sought to share information with:  Case Manager, Customer service manager, Family Supports Permission granted to share information::  Yes, Verbal Permission Granted  Name::     Education administrator::  SNF  Relationship::  Development worker, international aid Information:     Housing/Transportation Living arrangements for the past 2 months:  Single Family Home Source of Information:  (Daughter and Granddaughter ) Patient Interpreter Needed:  None Criminal Activity/Legal Involvement Pertinent to Current Situation/Hospitalization:  No - Comment as needed Significant Relationships:  Adult Children, Other Family Members Lives with:  Adult Children Do you feel safe going back to the place where you live?  Yes Need for family participation in patient care:  Yes (Patient has dementia)  Care giving concerns:   Patient admitted NOT:[id number] Mental Status and fever.  Patient is bed bound with multiple ulcers and was receiving hydrotherapy at SNF.  Per physician note, palliative has been consulted.  CSW will continue to assist with discharge needs. Patient will need prior authorization before returning to SNF.   Social Worker assessment / plan:  CSW met with patient daughter and granddaughter at bedside. Patient discharged 12/31 to [facility]. Patient admitted from SNF, family agreeable to return for Hydrotherapy if patient progresses.   Plan: unknown. CSW will continue to follow and assist with discharge planning.   Employment status:  Retired Nurse, adult PT Recommendations:  [place] / Referral to community resources:     Patient/Family's Response to care:  Patient family is concern about patient  progression and is hoping to talk with physician about patient plan of care.   Patient/Family's Understanding of and Emotional Response to Diagnosis, Current Treatment, and Prognosis:  Patient daughter and granddaughter very knowledgeable and involved in patient care.   Emotional Assessment Appearance:  Appears older than stated age Attitude/Demeanor/Rapport:    Affect (typically observed):  Accepting Orientation:  Oriented to Self Alcohol / Substance use:  Not Applicable Psych involvement (Current and /or in the community):  No (Comment)  Discharge Needs  Concerns to be addressed:  No discharge needs identified Readmission within the last 30 days:  No Current discharge risk:  None Barriers to Discharge:  Continued Medical Work up   [employer], LCSW 04/15/2017, 3:12 PM

## 2017-04-15 NOTE — Progress Notes (Signed)
    [facility] for Infectious Disease    Date of Admission:  04/08/2017   Total days of antibiotics 8        Day 8 vanco/piptaz         ID: Julia Floyd is a 84 y.o. female with sepsis likely due to sacral osteo Principal Problem:   Fever Active Problems:   Gait disturbance, post-stroke   Dysphagia   Alzheimer's dementia   Sacral decubitus ulcer   Sacral osteomyelitis (HCC)   Sepsis associated hypotension (HCC)   Anemia of chronic disease   Moderate protein-calorie malnutrition (HCC)  Subjective: Still ongoing fever, not much improvement since admit   Assessment/Plan: Continue with current abtx of vancomycin and piptazo.   [facility] Cell: [phone] Pager: [phone]  04/15/2017, 2:31 PM

## 2017-04-15 NOTE — Progress Notes (Signed)
HYDROTHERAPY TREATMENT     04/15/17 1200  Subjective Assessment  Subjective pt kept eyes closed mostly. she did not speak much  Patient and Family Stated Goals none stated  Date of Onset (unknown-POA 03/14/17)  Prior Treatments hydrotherapy initiated 03/17/17  Evaluation and Treatment  Evaluation and Treatment Procedures Explained to Patient/Family Yes  Evaluation and Treatment Procedures Patient unable to consent due to mental status  Pressure Injury 03/17/17 Stage IV - Full thickness tissue loss with exposed bone, tendon or muscle. *PT*  S/P I&D 03/16/17  Date First Assessed/Time First Assessed: 03/17/17 1340   Location: Sacrum  Staging: Stage IV - Full thickness tissue loss with exposed bone, tendon or muscle.  Wound Description (Comments): *PT*  S/P I&D 03/16/17  Present on Admission: Yes  Dressing Type Barrier Film (skin prep);ABD;Moist to dry (Santyl)  Dressing Changed  Dressing Change Frequency Twice a day  State of Healing Non-healing  Site / Wound Assessment Pink;Yellow;Brown  % Wound base Red or Granulating 60%  % Wound base Yellow/Fibrinous Exudate 40%  Margins Unattached edges (unapproximated)  Drainage Amount Moderate  Drainage Description Serosanguineous  Treatment Debridement (Selective);Hydrotherapy (Pulse lavage);Packing (Saline gauze) (santyl)  Pressure Injury 03/17/17 Unstageable - Full thickness tissue loss in which the base of the ulcer is covered by slough (yellow, tan, gray, green or brown) and/or eschar (tan, brown or black) in the wound bed. *PT*  Date First Assessed/Time First Assessed: 03/17/17 1340   Location: Ischial tuberosity  Location Orientation: Left  Staging: Unstageable - Full thickness tissue loss in which the base of the ulcer is covered by slough (yellow, tan, gray, green or brown...  Treatment (replaced dressing (tends to come off when removing sacral dr)  Hydrotherapy  Pulsed Lavage with Suction (psi) 8 psi  Pulsed Lavage with Suction - Normal  Saline Used 1000 mL  Pulsed Lavage Tip Tip with splash shield  Pulsed lavage therapy - wound location sacral  Selective Debridement  Selective Debridement - Location sacrum  Selective Debridement - Tools Used Forceps;Scissors  Selective Debridement - Tissue Removed brown and yellow tissue  Wound Therapy - Assess/Plan/Recommendations  Wound Therapy - Clinical Statement 84 yo female admitted from SNF with multiple wounds including sacral wound found to have osteomyelitis. S/P Incision and Drainage 03/16/17. Hx CVA-primarily bedbound.   Wound Therapy - Functional Problem List Limited mobility due to hx of CVA and recent fall in 09/2016 (per pt). Long periods of time spent sitting in wheelchair without pressure redistribution  Factors Delaying/Impairing Wound Healing Diabetes Mellitus;Incontinence;Immobility;Infection - systemic/local  Hydrotherapy Plan Debridement;Dressing change;Patient/family education;Pulsatile lavage with suction  Wound Therapy - Frequency 6X / week  Wound Therapy - Current Recommendations Case manager/social work  Wound Therapy - Follow Up Recommendations Skilled nursing facility  Wound Plan hydrotherapy to sacral wound. Wound appears worse on today compared to evaluation. Also noted periwound skin issues-see note.  Wound Therapy Goals - Improve the function of patient's integumentary system by progressing the wound(s) through the phases of wound healing by:  Decrease Necrotic Tissue to 20  Decrease Necrotic Tissue - Progress Not progressing  Increase Granulation Tissue to 80  Increase Granulation Tissue - Progress Mot progressing  Goals/treatment plan/discharge plan were made with and agreed upon by patient/family No, Patient unable to participate in goals/treatment/discharge plan and family unavailable  Time For Goal Achievement 2 weeks  Wound Therapy - Potential for Goals Poor    Weston Anna, MPT [phone]

## 2017-04-15 NOTE — Progress Notes (Addendum)
PROGRESS NOTE  [name]  LKG:[id number] DOB: Oct 25, 1930 DOA: 04/08/2017 PCP: Josetta Huddle, MD   Brief Narrative: Julia Floyd is an 84 y.o. female with a history of CVA/ICH s/p TPA with residual deficits, HTN, T2DM and sacral decubitus ulcer requiring debridement followed by hydrotherapy recently discharged 03/30/2017 to SNF who returned from SNF for fever and AMS, having decreased per oral intake. IV vancomycin and zosyn given empirically for osteomyelitis. Cultures drawn and 2u PRBCs provided. Continued to have recurrent fevers. ID consulted, felt fevers would continue despite adequate treatment. Not significantly improving, palliative care consulted for further goals of care discussions.   No acute events reported overnight. Afebrile with Tmax 99.9. Patient seen and examined at her bedside. She is confused in the setting of advanced dementia. Aggressive when examined. Does not appear to be in acute distress.  Assessment & Plan: Principal Problem:   Fever Active Problems:   Gait disturbance, post-stroke   Dysphagia   Alzheimer's dementia   Sacral decubitus ulcer   Sacral osteomyelitis (HCC)   Sepsis associated hypotension (HCC)   Anemia of chronic disease   Moderate protein-calorie malnutrition (HCC)  Severe sepsis due to osteomyelitis of sacrum: - CT abd pelvis wo contrast- left sided sacrum concerning for osteomyelitis (03/13/17)  - ID believes recurrent fevers are due to wound with very poor chance of healing, continuing vanc/zosyn for now. Still having fevers and decompensation in vital signs. - blood cultures no growth 5 days - Receiving IVF's, albumin to maintain intravascular fluid - MAP in the low 60's-goal 65 - Full code - IV vanc and IV zosyn day#6  History of CVA/ICH with dysphagia: Residual deficits have left her bedbound/wheelchair bound. - Dysphagia 2 diet - hold off chemical DVT ppx/antiplatelets due to recent [place] post TPA 6 months ago -  neurochecks q4h  Anasarca in the setting of hypotension -cut down IV fluid to 50cc/hr NS -albumin added 25 gm q6H x4 -close monitoring of blood pressure -Maintain MAP 65  Unstageable sacral pressure ulcer with concern for osteomyelitis:  - s/p debridement 12/17 at recent admission. - Appreciate general surgery recommendations: Now undergoing hydrotherapy (1/10, 1/12, 1/14). No need for repeat debridement. - RD consulted, nutritional supplement as ordered.  Normocytic anemia of chronic disease: Ferritin 1,477.   - Hgb 6.5 on admission, up to 9.2 w/2u PRBCs. Monitoring CBC intermittently  Thrombocytopenia: Resolved, likely due to linezolid given at previous admission.  Hypokalemia: Improved with replacement.  Critical limb ischemia, PAD, complicated by bilateral heel pressure wounds: [name] 0.37 (R) and 0.28 (L).  - Consulted vascular surgery, Dr. Bridgett Larsson, during last admission 12/28, felt conservative measures appropriate.  - Wound care per WOC, floating heels.   History of T2DM:  - last A1C was 5.1%  - Holding metformin  Hypertension: - Holding norvasc due to hypotension   DVT prophylaxis: hold off chemical DVT PPX due to recent [place] post TPA (6 months ago); SCDs Code Status: Full Family Communication: Spoke with the patient's granddaughter 04/15/17 who states she is also medical POA. All questions answered to her satisfaction. Disposition Plan: Will stay another midnight to continue IV antibiotics and to maintain hemodynamical stability in the setting of severe sepsis. Consultants: General surgery, ID, palliative care  Procedures:   None  Antimicrobials:  Vanc/zosyn 1/9 >>     Objective: Vitals:   04/15/17 0018 04/15/17 0200 04/15/17 0353 04/15/17 0400  BP:  (!) 130/37  (!) 118/34  Pulse:  96  98  Resp:  (!) 28  (!)  28  Temp: 98.7 F (37.1 C)  99.9 F (37.7 C)   TempSrc: Oral  Axillary   SpO2:  100%  100%  Weight:      Height:        Intake/Output  Summary (Last 24 hours) at 04/15/2017 3267 Last data filed at 04/15/2017 0442 Gross per 24 hour  Intake 2970 ml  Output 525 ml  Net 2445 ml   Filed Weights   04/08/17 1821  Weight: 78.5 kg (173 lb)   Gen: Elderly female in no distress lethargic Pulm: Non-labored, normal rate, 100%, clear.  CV: Regular rate and rhythm. No murmur, rub, or gallop. No JVD, no edema at all. GI: Abdomen soft, non-tender, non-distended, with normoactive bowel sounds. No organomegaly or masses felt. Ext: Dry calcaneal ulcers bilaterally without fluctuance or purulence. Heel boots in place. Skin: Sacral ulcer as pictured in general surgery note 1/14. Foam pad dressing in place. Neuro: lethargic, not oriented. No focal deficits on exam limited by diffuse weakness. Psych: Judgement and insight appear impaired. Withdrawn.   Data Reviewed: I have personally reviewed following labs and imaging studies  CBC: Recent Labs  Lab 04/08/17 1900 04/09/17 0527 04/10/17 0304 04/12/17 0334  WBC 9.9 11.4* 12.0* 9.6  NEUTROABS 6.6  --   --   --   HGB 6.5* 8.7* 9.2* 8.3*  HCT 19.9* 26.0* 27.8* 25.8*  MCV 80.2 82.0 81.5 83.2  PLT 235 206 232 124   Basic Metabolic Panel: Recent Labs  Lab 04/10/17 0304 04/11/17 0340 04/12/17 0334 04/13/17 0333 04/14/17 0314 04/15/17 0301  NA 136  --  136 134* 135 137  K 3.6  --  3.3* 4.1 4.3 4.1  CL 108  --  108 108 111 115*  CO2 21*  --  21* 20* 20* 18*  GLUCOSE 97  --  82 72 86 86  BUN 20  --  18 14 14 12   CREATININE 0.81 0.84 0.76 0.74 0.82 0.87  CALCIUM 8.0*  --  7.8* 7.7* 7.5* 7.7*   GFR: Estimated Creatinine Clearance: 50.9 mL/min (by C-G formula based on SCr of 0.87 mg/dL).   Liver Function Tests: Recent Labs  Lab 04/08/17 1900  AST 45*  ALT 12*  ALKPHOS 67  BILITOT 0.6  PROT 6.0*  ALBUMIN 1.8*   Coagulation Profile: Recent Labs  Lab 04/08/17 2115  INR 1.14    Urine analysis:    Component Value Date/Time   COLORURINE AMBER (A) 04/10/2017 0550    APPEARANCEUR CLEAR 04/10/2017 0550   LABSPEC 1.015 04/10/2017 0550   PHURINE 5.0 04/10/2017 0550   GLUCOSEU NEGATIVE 04/10/2017 0550   HGBUR NEGATIVE 04/10/2017 0550   BILIRUBINUR NEGATIVE 04/10/2017 0550   KETONESUR NEGATIVE 04/10/2017 0550   PROTEINUR NEGATIVE 04/10/2017 0550   NITRITE NEGATIVE 04/10/2017 0550   LEUKOCYTESUR NEGATIVE 04/10/2017 0550   Recent Results (from the past 240 hour(s))  Blood culture (routine x 2)     Status: None   Collection Time: 04/08/17  7:54 PM  Result Value Ref Range Status   Specimen Description BLOOD RIGHT ANTECUBITAL  Final   Special Requests   Final    BOTTLES DRAWN AEROBIC AND ANAEROBIC Blood Culture adequate volume   Culture   Final    NO GROWTH 5 DAYS Performed at [facility], [place] [address] [place], [place] [address]    Report Status 04/13/2017 FINAL  Final  Blood culture (routine x 2)     Status: None   Collection Time: 04/08/17  7:56 PM  Result Value Ref Range Status   Specimen Description BLOOD LEFT ARM  Final   Special Requests   Final    BOTTLES DRAWN AEROBIC AND ANAEROBIC Blood Culture results may not be optimal due to an inadequate volume of blood received in culture bottles   Culture   Final    NO GROWTH 5 DAYS Performed at [facility], [place] [address] [place], [place] [address]    Report Status 04/13/2017 FINAL  Final  MRSA PCR Screening     Status: None   Collection Time: 04/10/17 12:45 AM  Result Value Ref Range Status   MRSA by PCR NEGATIVE NEGATIVE Final    Comment:        The GeneXpert MRSA Assay (FDA approved for NASAL specimens only), is one component of a comprehensive MRSA colonization surveillance program. It is not intended to diagnose MRSA infection nor to guide or monitor treatment for MRSA infections.   Culture, Urine     Status: None   Collection Time: 04/10/17  5:50 AM  Result Value Ref Range Status   Specimen Description URINE, CATHETERIZED  Final   Special Requests NONE  Final    Culture   Final    NO GROWTH Performed at [facility], [address], [place], [place] [address]    Report Status 04/11/2017 FINAL  Final      Radiology Studies: No results found.  Scheduled Meds: . collagenase   Topical BID  . enoxaparin (LOVENOX) injection  40 mg Subcutaneous QHS  . feeding supplement (ENSURE ENLIVE)  237 mL Oral BID BM  . multivitamin with minerals  1 tablet Oral Daily  . nutrition supplement (JUVEN)  1 packet Oral BID BM   Continuous Infusions: . piperacillin-tazobactam (ZOSYN)  IV 3.375 g (04/15/17 0437)  . 0.9 % sodium chloride with kcl 100 mL/hr at 04/15/17 0059  . vancomycin Stopped (04/14/17 1100)     LOS: 7 days   Time spent: 25 minutes.  Kayleen Memos, MD [facility] Pager [phone]  If 7PM-7AM, please contact night-coverage [email or web address] Password [email or web address] 04/15/2017, 7:12 AM

## 2017-04-16 DIAGNOSIS — M4628 Osteomyelitis of vertebra, sacral and sacrococcygeal region: Secondary | ICD-10-CM

## 2017-04-16 DIAGNOSIS — G309 Alzheimer's disease, unspecified: Secondary | ICD-10-CM

## 2017-04-16 DIAGNOSIS — D638 Anemia in other chronic diseases classified elsewhere: Secondary | ICD-10-CM

## 2017-04-16 DIAGNOSIS — R509 Fever, unspecified: Secondary | ICD-10-CM

## 2017-04-16 DIAGNOSIS — F0280 Dementia in other diseases classified elsewhere without behavioral disturbance: Secondary | ICD-10-CM

## 2017-04-16 DIAGNOSIS — E440 Moderate protein-calorie malnutrition: Secondary | ICD-10-CM

## 2017-04-16 DIAGNOSIS — L89154 Pressure ulcer of sacral region, stage 4: Secondary | ICD-10-CM

## 2017-04-16 LAB — CBC
HEMATOCRIT: 21.6 % — AB (ref 36.0–46.0)
HEMOGLOBIN: 7 g/dL — AB (ref 12.0–15.0)
MCH: 26.8 pg (ref 26.0–34.0)
MCHC: 32.4 g/dL (ref 30.0–36.0)
MCV: 82.8 fL (ref 78.0–100.0)
Platelets: 157 10*3/uL (ref 150–400)
RBC: 2.61 MIL/uL — ABNORMAL LOW (ref 3.87–5.11)
RDW: 18.9 % — AB (ref 11.5–15.5)
WBC: 8 10*3/uL (ref 4.0–10.5)

## 2017-04-16 LAB — BASIC METABOLIC PANEL
ANION GAP: 7 (ref 5–15)
BUN: 11 mg/dL (ref 6–20)
CALCIUM: 8.3 mg/dL — AB (ref 8.9–10.3)
CO2: 17 mmol/L — AB (ref 22–32)
Chloride: 116 mmol/L — ABNORMAL HIGH (ref 101–111)
Creatinine, Ser: 0.92 mg/dL (ref 0.44–1.00)
GFR calc Af Amer: >60 mL/min (ref >60)
GFR calc non Af Amer: 56 mL/min — ABNORMAL LOW (ref >60)
GLUCOSE: 63 mg/dL — AB (ref 65–99)
Potassium: 3.7 mmol/L (ref 3.5–5.1)
Sodium: 140 mmol/L (ref 135–145)

## 2017-04-16 LAB — VANCOMYCIN, TROUGH: Vancomycin Tr: 24 ug/mL (ref 15–20)

## 2017-04-16 MED ORDER — METOPROLOL TARTRATE 5 MG/5ML IV SOLN
5.0000 mg | Freq: Once | INTRAVENOUS | Status: AC | PRN
  Administered 2017-04-16: 5 mg via INTRAVENOUS
  Filled 2017-04-16: qty 5

## 2017-04-16 MED ORDER — VANCOMYCIN HCL IN DEXTROSE 1-5 GM/200ML-% IV SOLN
1000.0000 mg | INTRAVENOUS | Status: DC
  Administered 2017-04-18: 1000 mg via INTRAVENOUS
  Filled 2017-04-16: qty 200

## 2017-04-16 NOTE — Progress Notes (Signed)
Palliative care progress note  Reason for consult: Goals of care in light of continued infection related to multiple wounds.  Chart reviewed and discussed with bedside RN.  I saw and examined Julia Floyd and she is more awake and interactive today.  She was able to open eyes and answer a few simple questions for me today.  She still does not have insight into her medical condition.  I called and was able to reach her granddaughter, Julia Floyd.  She reports forgetting to call me to set up time for family meeting this AM.  She has a funeral to attend tomorrow and family will be unable to meet for a meeting.  We set up a meeting for Saturday at 1100.  Her granddaughter reports that family was encouraged by the fact that she is more interactive today and goals remains to continue with aggressive care to see if she continues to improve over the next day or two.  We discussed surgical evaluation today and the fact that her wound is not improving and will continue to be source of infection even if she continues on long course of antibiotics.  I continued to stress with family that even if she displays some short term improvement, she remains with a long standing, unfixable problem.  Her granddaughter reports that she will discuss further with her family.  Family meeting set up for Saturday at 1100.  Total time: 30 minutes Greater than 50%  of this time was spent counseling and coordinating care related to the above assessment and plan.  Micheline Rough, MD [facility] [phone]

## 2017-04-16 NOTE — Plan of Care (Signed)
  Elimination: Will not experience complications related to bowel motility 04/16/2017 1341 - Progressing by Dorene Sorrow, RN   Nutrition: Adequate nutrition will be maintained 04/16/2017 1341 - Not Progressing by Dorene Sorrow, RN   Safety: Ability to remain free from injury will improve 04/16/2017 1341 - Progressing by Dorene Sorrow, RN

## 2017-04-16 NOTE — Progress Notes (Signed)
   04/16/17 1600  Hydrotherapy note  Subjective Assessment  Subjective pt kept eyes closed mostly. she did not speak much  Patient and Family Stated Goals none stated  Date of Onset (unknown-POA 03/14/17)  Prior Treatments hydrotherapy initiated 03/17/17  Evaluation and Treatment  Evaluation and Treatment Procedures Explained to Patient/Family Yes  Evaluation and Treatment Procedures Patient unable to consent due to mental status  Pressure Injury 03/17/17 Stage IV - Full thickness tissue loss with exposed bone, tendon or muscle. *PT*  S/P I&D 03/16/17  Date First Assessed/Time First Assessed: 03/17/17 1340   Location: Sacrum  Staging: Stage IV - Full thickness tissue loss with exposed bone, tendon or muscle.  Wound Description (Comments): *PT*  S/P I&D 03/16/17  Present on Admission: Yes  Dressing Type Barrier Film (skin prep);ABD;Moist to dry (Santyl)  Dressing Changed  Dressing Change Frequency Twice a day  State of Healing Non-healing  Site / Wound Assessment Pink;Yellow;Brown  % Wound base Red or Granulating 60%  % Wound base Yellow/Fibrinous Exudate 40%  Peri-wound Assessment Erythema (blanchable)  Wound Length (cm) 17 cm  Wound Width (cm) 14 cm  Wound Depth (cm) 3 cm  Wound Surface Area (cm^2) 238 cm^2  Wound Volume (cm^3) 714 cm^3  Margins Unattached edges (unapproximated)  Drainage Amount Moderate  Drainage Description Serosanguineous  Treatment Hydrotherapy (Pulse lavage);Packing (Saline gauze)  Hydrotherapy  Pulsed Lavage with Suction (psi) 8 psi  Pulsed Lavage with Suction - Normal Saline Used 1000 mL  Pulsed Lavage Tip Tip with splash shield  Pulsed lavage therapy - wound location sacral  Wound Therapy - Assess/Plan/Recommendations  Wound Therapy - Clinical Statement 84 yo female admitted from SNF with multiple wounds including sacral wound found to have osteomyelitis. S/P Incision and Drainage 03/16/17. Hx CVA-primarily bedbound. The patient appeatrs in pain during  treatment noted by moaning and groaning. Will [place], PA in to inspect wound. It is strongly recommended to discontinue hydrotherapy as the wound has made no progress  since last admission.   Wound Therapy - Functional Problem List Limited mobility due to hx of CVA and recent fall in 09/2016 (per pt). Long periods of time spent sitting in wheelchair without pressure redistribution  Factors Delaying/Impairing Wound Healing Diabetes Mellitus;Incontinence;Immobility;Infection - systemic/local  Hydrotherapy Plan Debridement;Dressing change;Patient/family education;Pulsatile lavage with suction  Wound Therapy - Frequency 6X / week  Wound Therapy - Current Recommendations Case manager/social work  Wound Therapy - Follow Up Recommendations Skilled nursing facility  Wound Plan hydrotherapy to sacral wound. Wound appears worse on today compared to evaluation. Also noted periwound skin issues-see note.  Wound Therapy Goals - Improve the function of patient's integumentary system by progressing the wound(s) through the phases of wound healing by:  Decrease Necrotic Tissue to 20  Decrease Necrotic Tissue - Progress Not progressing  Increase Granulation Tissue to 80  Increase Granulation Tissue - Progress Mot progressing  Goals/treatment plan/discharge plan were made with and agreed upon by patient/family No, Patient unable to participate in goals/treatment/discharge plan and family unavailable  Time For Goal Achievement 2 weeks  Wound Therapy - Potential for Goals Poor  Tresa Endo PT [phone]

## 2017-04-16 NOTE — Progress Notes (Signed)
Date: April 16, 2017 Velva Harman, BSN, [place], [place] Chart and notes review for patient progress and needs. Will follow for case management and discharge needs.  Palliative care meetings with the family scheduled for [phone] to discuss next step and poss comfort care.  Decubitus are not responding to aggressive therapies. No cm or discharge needs present at time of this review. Next review date: [id number]

## 2017-04-16 NOTE — Progress Notes (Signed)
Pharmacy Antibiotic Note  Julia Floyd is a 84 y.o. female admitted on 04/08/2017 with sepsis, recurrent fevers, sacral osteomyelitis.  Pharmacy has been consulted for vancomycin and zosyn dosing.  Plan:  Vancomycin peak = 40 mcg/ml, trough 24 mcg/ml  Trough resulted during infusion of today's dose  Change Vancomycin to 1g IV q36h for goal AUC 400-500.   Continue Zosyn 3.375g IV q8h (4 hour infusion time).   Daily SCr.  F/u LOT  Height: 5\' 6"  (167.6 cm) Weight: 170 lb 3.1 oz (77.2 kg) IBW/kg (Calculated) : 59.3  Temp (24hrs), Avg:99.8 F (37.7 C), Min:98.9 F (37.2 C), Max:100.4 F (38 C)  Recent Labs  Lab 04/10/17 0304  04/12/17 0334 04/13/17 0333 04/14/17 0314 04/15/17 0301 04/15/17 1138 04/16/17 0727  WBC 12.0*  --  9.6  --   --   --   --  8.0  CREATININE 0.81   < > 0.76 0.74 0.82 0.87  --  0.92  VANCOTROUGH  --   --   --   --   --   --   --  24*  VANCOPEAK  --   --   --   --   --   --  40  --    < > = values in this interval not displayed.    Estimated Creatinine Clearance: 47.8 mL/min (by C-G formula based on SCr of 0.92 mg/dL).    No Known Allergies  Antimicrobials this admission: 1/9 Vanc >>  1/9 Zosyn >>   Dose adjustments this admission: 1/17 Vancomycin 1gm q24 to q36 hr  Microbiology results: 1/9BCx: NGF 1/11 UCx: NGF 1/11 MRSA PCR: neg  Thank you for allowing pharmacy to be a part of this patient's care.  Minda Ditto PharmD Pager [phone] 04/16/2017, 10:15 AM

## 2017-04-16 NOTE — Progress Notes (Signed)
CRITICAL VALUE ALERT  Critical Value:  Vancomycin Trough 24  Date & Time Notied:  04/16/17  0825  Provider Notified: Pharmacist Coralyn Mark, Dr Darrick Meigs  Orders Received/Actions taken: Per pharmacist continue with Vancomycin dose for this morning.

## 2017-04-16 NOTE — Progress Notes (Signed)
[facility] for Infectious Disease    Date of Admission:  04/08/2017   Total days of antibiotics 9        Day 9 vanco/piptazo          ID: Julia Floyd is a 84 y.o. female with hx of CVA, Alzheimer's dementia, bed-bound with large sacral ulcer and osteomyelitis Principal Problem:   Fever Active Problems:   Gait disturbance, post-stroke   Dysphagia   Alzheimer's dementia   Sacral decubitus ulcer   Sacral osteomyelitis (HCC)   Sepsis associated hypotension (HCC)   Anemia of chronic disease   Moderate protein-calorie malnutrition (HCC)    Subjective: Still having low grade fevers of 100.67F. She answers some simple questions and was able to undergo hydrotherapy. The pictures of the current status of the wound is noted in general surgery note from today. As it mentions there is still some areas that are worsening since we are unable to offload. Area at risk for fecal contaminantion 75% of wound has good granulation tissue but the remaining has exudate still.  Medications:  . collagenase   Topical BID  . feeding supplement (ENSURE ENLIVE)  237 mL Oral BID BM  . multivitamin with minerals  1 tablet Oral Daily  . nutrition supplement (JUVEN)  1 packet Oral BID BM    Objective: Vital signs in last 24 hours: Temp:  [98.8 F (37.1 C)-100.3 F (37.9 C)] 98.8 F (37.1 C) (01/17 1200) Pulse Rate:  [87-97] 97 (01/17 1200) Resp:  [16-28] 28 (01/17 1200) BP: (123-168)/(39-75) 161/56 (01/17 1200) SpO2:  [100 %] 100 % (01/17 1200) Weight:  [170 lb 3.1 oz (77.2 kg)] 170 lb 3.1 oz (77.2 kg) (01/17 0342) Physical Exam  Constitutional:  Answers yes to her name. appears chronically ill. . No distress.  HENT: [place]/AT, PERRLA, no scleral icterus Mouth/Throat: Oropharynx is clear and moist. No oropharyngeal exudate.  Cardiovascular: Normal rate, regular rhythm and normal heart sounds. Exam reveals no gallop and no friction rub.  No murmur heard.  Pulmonary/Chest: Effort normal and  breath sounds normal. No respiratory distress.  has no wheezes.  Neck = supple, no nuchal rigidity Abdominal: Soft. Bowel sounds are normal.  exhibits no distension. There is no tenderness.  Neurological: minimal movement of extremities when asked to move her appendages Skin: Skin is warm and dry. Ext: muscle wasting mild contracture from neuro deficit Psychiatric: flat affect   Lab Results Recent Labs    04/15/17 0301 04/16/17 0727  WBC  --  8.0  HGB  --  7.0*  HCT  --  21.6*  NA 137 140  K 4.1 3.7  CL 115* 116*  CO2 18* 17*  BUN 12 11  CREATININE 0.87 0.92    Microbiology: 1/9 blood cx ngtd 1/11 urine cx ngtd mrsa screen is negative Studies/Results: 12/14 CT IMPRESSION: 1. Prominent sacral decubitus ulceration on the left side, extending through the medial aspect of the left gluteal musculature. It measures approximately 9 x 8 cm in size. The pattern of air within the ulceration is somewhat unusual, with largely intact overlying skin. Would correlate clinically to exclude necrotizing fasciitis. 2. Suggestion of mild erosion at the left-sided sacrum, concerning for underlying osteomyelitis.  Assessment/Plan: Sacral osteomyelitis/sacral pressure ulcer = very extensive ulcer that is likely cause of her fevers. She is currently on broad spectrum abtx though she will still need to have her wound care/off loading/better nutrition in hopes for improvement. Given her underlying dementia, immobility/deconditioning from stroke, successful  treatment of this process is slim. For now continue on broad spectrum abtx. Await to see if goals of care discussion with family.  [facility] Cell: [phone] Pager: [phone]  04/16/2017, 4:07 PM

## 2017-04-16 NOTE — Progress Notes (Signed)
CC: Fever and altered mental status.  Subjective: No real change in the patient's mental status.  Ongoing dementia but seems comfortable except for and were trying to do her wound care.  She is incontinent with a loose bowel movement at the time of exam. Pictures of the wound are below but essentially the open area has not really changed any since her admission.  As you can see the good skin bordering the wound is slowly drying up and breaking down.  There is no significant change in the main body of the wound. Objective: Vital signs in last 24 hours: Temp:  [98.8 F (37.1 C)-100.3 F (37.9 C)] 98.8 F (37.1 C) (01/17 1200) Pulse Rate:  [87-97] 97 (01/17 1200) Resp:  [16-28] 28 (01/17 1200) BP: (116-168)/(35-75) 161/56 (01/17 1200) SpO2:  [100 %] 100 % (01/17 1200) Weight:  [77.2 kg (170 lb 3.1 oz)] 77.2 kg (170 lb 3.1 oz) (01/17 0342) Last BM Date: 04/16/17  Intake/Output from previous day: 01/16 0701 - 01/17 0700 In: 2277.5 [P.O.:10; I.V.:1617.5; IV Piggyback:650] Out: 785 [Urine:785] Intake/Output this shift: No intake/output data recorded.  General appearance: She is awake but does not speak.  She is clearly uncomfortable with manipulation and turning her over to do wound care.  She is also having massive stool incontinence. Skin: see pictures below        04/13/17  Picture:   Skin at 3 O'clock on this picture is slowly getting worse.   Today 04/16/17 broad picture    Again skin at 3 O'clock on this picture as above is slowly getting worse.   Lab Results:  Recent Labs    04/16/17 0727  WBC 8.0  HGB 7.0*  HCT 21.6*  PLT 157    BMET Recent Labs    04/15/17 0301 04/16/17 0727  NA 137 140  K 4.1 3.7  CL 115* 116*  CO2 18* 17*  GLUCOSE 86 63*  BUN 12 11  CREATININE 0.87 0.92  CALCIUM 7.7* 8.3*   PT/INR No results for input(s): LABPROT, INR in the last 72 hours.  No results for input(s): AST, ALT, ALKPHOS, BILITOT, PROT, ALBUMIN in the last  168 hours.   Lipase  No results found for: LIPASE   Medications: . collagenase   Topical BID  . feeding supplement (ENSURE ENLIVE)  237 mL Oral BID BM  . multivitamin with minerals  1 tablet Oral Daily  . nutrition supplement (JUVEN)  1 packet Oral BID BM   Anti-infectives (From admission, onward)   Start     Dose/Rate Route Frequency Ordered Stop   04/18/17 0800  vancomycin (VANCOCIN) IVPB 1000 mg/200 mL premix     1,000 mg 200 mL/hr over 60 Minutes Intravenous Every 36 hours 04/16/17 1016     04/09/17 0800  vancomycin (VANCOCIN) IVPB 1000 mg/200 mL premix  Status:  Discontinued     1,000 mg 200 mL/hr over 60 Minutes Intravenous Every 24 hours 04/08/17 2128 04/16/17 1016   04/09/17 0400  piperacillin-tazobactam (ZOSYN) IVPB 3.375 g     3.375 g 12.5 mL/hr over 240 Minutes Intravenous Every 8 hours 04/08/17 2128     04/08/17 2115  vancomycin (VANCOCIN) IVPB 1000 mg/200 mL premix     1,000 mg 200 mL/hr over 60 Minutes Intravenous  Once 04/08/17 2104 04/08/17 2242   04/08/17 2115  piperacillin-tazobactam (ZOSYN) IVPB 3.375 g     3.375 g 100 mL/hr over 30 Minutes Intravenous  Once 04/08/17 2104 04/08/17 2156  Assessment/Plan Sacral decubitus Incision and debridement of sacral stage IV decubitus (15x20x3cm including subcutaneous, fascia, muscle and periosteum) and left upper posterior thigh full thickness wound (3x2x2cm, subcutaneous)03/16/17, Dr. Jens Som  - Her open wound is about the same, soupy upper portion has cleared up, but the skin adjacent to the open wound at the 3 o'clock position above is continuing to decline.  The hydro has cleaned it up nicely but she is not progressing and is still unable to off load pressure.    Osteomyelitis -probable by CT and on exam.   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   HTN T2  Diabetes Mellitus Left heal ulcer/PAD/heel pressure ulcers Hx of stroke - stable Multiple uterine fibroids  QPY:[id number] II VTE: SCDs ID: per ID   Plan:  I do not see much improvement in her mentation or her wound.  She continues to lose good skin secondary to ongoing pressure, she cannot move or help you move her independently.  I would decrease hydrotherapy, continue dressing changes and see what can be arranged with family and Palliative.  I do not see this getting better.        LOS: 8 days    Chasitie Passey 04/16/2017 [phone]

## 2017-04-16 NOTE — Progress Notes (Signed)
[facility]  PROGRESS NOTE  Julia Floyd EHU:[id number] DOB: 1930/12/28 DOA: 04/08/2017 PCP: Josetta Huddle, MD   Brief HPI:   84 y.o. female with a history of CVA/ICH s/p TPA with residual deficits, HTN, T2DM and sacral decubitus ulcer requiring debridement followed by hydrotherapy recently discharged 03/30/2017 to SNF who returned from SNF for fever and AMS, having decreased per oral intake. IV vancomycin and zosyn given empirically for osteomyelitis. Cultures drawn and 2u PRBCs provided. Continued to have recurrent fevers. ID consulted, felt fevers would continue despite adequate treatment. Not significantly improving, palliative care consulted for further goals of care discussions.       Subjective   Patient seen and examined, lethargic confused.   Assessment/Plan:     1. Severe sepsis due to osteomyelitis of sacrum- CT abdomen pelvis without contrast showed left-sided sacrum concerning for osteomyelities. IT was consulted and recommend continue with vancomycin and Zosyn. Patient continues to have fever, blood cultures showed no growth. Continue vancomycin and Zosyn. Day # 7. 2. History of CVA/ICH with dysphagia-residual deficits have left patient bedbound/wheelchair-bound. Continue dysphagia 2 diet.  3. Anasarca/hypoalbuminemia-albumin is 1.8, patient given albumin 25 g Q6 hours x 4. 4. Unscheduled secular pressure ulcer- concern for [place] to mellitus, status post debridement on 12/17 her recent admission. Patient undergoing hydrotherapy, general surgery following. No need for repeat debridement. 5. Normocytic  anemia of chronic disease-ferritin 1477, hemoglobin 6.5 on admission. Up to 9.2 with two units P RBC. Today hemoglobin is 7.0 ,follow CBC in a.m. 6. Thrombocytopenia-resolved likely due to linezolid given during previous admission. 7. Hypokalemia-resolved 8. Critical limb ischemia/peripheral arterial disease-complicated by bilateral heat pressure wounds. ABI 0.37 on  right, 0.28 on left. Vascular surgery Dr. Bridgett Larsson was consulted during previous admission on 12/28, at that time he felt constant measures were appropriate. Wound care per wound nurse. 9. Hypertension-  Norvasc on hold due to hypotension    DVT prophylaxis: SCD, chemical DVT prophylaxis on hold due to recent ICH status post TPA six months ago  Code Status: Full code  Family Communication: No family present at bedside  Disposition Plan: to be  Determined, after discussion with palliative care   Consultants:  Palliative care  Procedures:  None   Continuous infusions . piperacillin-tazobactam (ZOSYN)  IV Stopped (04/16/17 0830)  . 0.9 % sodium chloride with kcl 50 mL/hr at 04/16/17 0520  . [START ON 04/18/2017] vancomycin        Antibiotics:   Anti-infectives (From admission, onward)   Start     Dose/Rate Route Frequency Ordered Stop   04/18/17 0800  vancomycin (VANCOCIN) IVPB 1000 mg/200 mL premix     1,000 mg 200 mL/hr over 60 Minutes Intravenous Every 36 hours 04/16/17 1016     04/09/17 0800  vancomycin (VANCOCIN) IVPB 1000 mg/200 mL premix  Status:  Discontinued     1,000 mg 200 mL/hr over 60 Minutes Intravenous Every 24 hours 04/08/17 2128 04/16/17 1016   04/09/17 0400  piperacillin-tazobactam (ZOSYN) IVPB 3.375 g     3.375 g 12.5 mL/hr over 240 Minutes Intravenous Every 8 hours 04/08/17 2128     04/08/17 2115  vancomycin (VANCOCIN) IVPB 1000 mg/200 mL premix     1,000 mg 200 mL/hr over 60 Minutes Intravenous  Once 04/08/17 2104 04/08/17 2242   04/08/17 2115  piperacillin-tazobactam (ZOSYN) IVPB 3.375 g     3.375 g 100 mL/hr over 30 Minutes Intravenous  Once 04/08/17 2104 04/08/17 2156       Objective  Vitals:   04/16/17 0200 04/16/17 0342 04/16/17 0400 04/16/17 0800  BP: (!) 140/48  (!) 147/75   Pulse: 90  95   Resp: (!) 22  16   Temp:  100.2 F (37.9 C)  99.4 F (37.4 C)  TempSrc:  Oral  Oral  SpO2: 100%  100%   Weight:  77.2 kg (170 lb 3.1 oz)     Height:        Intake/Output Summary (Last 24 hours) at 04/16/2017 1237 Last data filed at 04/16/2017 0500 Gross per 24 hour  Intake 1417.5 ml  Output 665 ml  Net 752.5 ml   Filed Weights   04/08/17 1821 04/16/17 0342  Weight: 78.5 kg (173 lb) 77.2 kg (170 lb 3.1 oz)     Physical Examination:   Physical Exam: Eyes: No icterus, extraocular muscles intact  Mouth: Oral mucosa is moist, no lesions on palate,  Neck: Supple, no deformities, masses, or tenderness Lungs: Normal respiratory effort, bilateral clear to auscultation, no crackles or wheezes.  Heart: Regular rate and rhythm, S1 and S2 normal, no murmurs, rubs auscultated Abdomen: BS normoactive,soft,nondistended,non-tender to palpation,no organomegaly Extremities: No pretibial edema, no erythema, no cyanosis, no clubbing Neuro : Alert, not oriented to time, place and person, No focal deficits      Data Reviewed: I have personally reviewed following labs and imaging studies  CBG: Recent Labs  Lab 04/12/17 0739  GLUCAP 76    CBC: Recent Labs  Lab 04/10/17 0304 04/12/17 0334 04/16/17 0727  WBC 12.0* 9.6 8.0  HGB 9.2* 8.3* 7.0*  HCT 27.8* 25.8* 21.6*  MCV 81.5 83.2 82.8  PLT 232 202 664    Basic Metabolic Panel: Recent Labs  Lab 04/12/17 0334 04/13/17 0333 04/14/17 0314 04/15/17 0301 04/16/17 0727  NA 136 134* 135 137 140  K 3.3* 4.1 4.3 4.1 3.7  CL 108 108 111 115* 116*  CO2 21* 20* 20* 18* 17*  GLUCOSE 82 72 86 86 63*  BUN 18 14 14 12 11   CREATININE 0.76 0.74 0.82 0.87 0.92  CALCIUM 7.8* 7.7* 7.5* 7.7* 8.3*    Recent Results (from the past 240 hour(s))  Blood culture (routine x 2)     Status: None   Collection Time: 04/08/17  7:54 PM  Result Value Ref Range Status   Specimen Description BLOOD RIGHT ANTECUBITAL  Final   Special Requests   Final    BOTTLES DRAWN AEROBIC AND ANAEROBIC Blood Culture adequate volume   Culture   Final    NO GROWTH 5 DAYS Performed at [facility], [address] [place], [place] [address]    Report Status 04/13/2017 FINAL  Final  Blood culture (routine x 2)     Status: None   Collection Time: 04/08/17  7:56 PM  Result Value Ref Range Status   Specimen Description BLOOD LEFT ARM  Final   Special Requests   Final    BOTTLES DRAWN AEROBIC AND ANAEROBIC Blood Culture results may not be optimal due to an inadequate volume of blood received in culture bottles   Culture   Final    NO GROWTH 5 DAYS Performed at [facility], [place] [address], [place], [place] [address]    Report Status 04/13/2017 FINAL  Final  MRSA PCR Screening     Status: None   Collection Time: 04/10/17 12:45 AM  Result Value Ref Range Status   MRSA by PCR NEGATIVE NEGATIVE Final    Comment:  The GeneXpert MRSA Assay (FDA approved for NASAL specimens only), is one component of a comprehensive MRSA colonization surveillance program. It is not intended to diagnose MRSA infection nor to guide or monitor treatment for MRSA infections.   Culture, Urine     Status: None   Collection Time: 04/10/17  5:50 AM  Result Value Ref Range Status   Specimen Description URINE, CATHETERIZED  Final   Special Requests NONE  Final   Culture   Final    NO GROWTH Performed at [facility], [address], [place], [place] [address]    Report Status 04/11/2017 FINAL  Final     Liver Function Tests: No results for input(s): AST, ALT, ALKPHOS, BILITOT, PROT, ALBUMIN in the last 168 hours. No results for input(s): LIPASE, AMYLASE in the last 168 hours. No results for input(s): AMMONIA in the last 168 hours.  Cardiac Enzymes: No results for input(s): CKTOTAL, CKMB, CKMBINDEX, TROPONINI in the last 168 hours. BNP (last 3 results) No results for input(s): BNP in the last 8760 hours.  ProBNP (last 3 results) No results for input(s): PROBNP in the last 8760 hours.    Studies: No results found.  Scheduled Meds: . collagenase   Topical BID  . feeding  supplement (ENSURE ENLIVE)  237 mL Oral BID BM  . multivitamin with minerals  1 tablet Oral Daily  . nutrition supplement (JUVEN)  1 packet Oral BID BM      Time spent: 20 min  [facility] Pager [phone]. If 7PM-7AM, please contact night-coverage at [email or web address], Office  [phone]  password [email or web address]  04/16/2017, 12:37 PM  LOS: 8 days

## 2017-04-16 NOTE — Progress Notes (Signed)
Pt transferred to telemetry at this time. Pt with stable vitals. RN updated about new order for PRN antihypertensive.

## 2017-04-17 LAB — CBC
HEMATOCRIT: 23.4 % — AB (ref 36.0–46.0)
Hemoglobin: 7.7 g/dL — ABNORMAL LOW (ref 12.0–15.0)
MCH: 26.9 pg (ref 26.0–34.0)
MCHC: 32.9 g/dL (ref 30.0–36.0)
MCV: 81.8 fL (ref 78.0–100.0)
PLATELETS: 151 10*3/uL (ref 150–400)
RBC: 2.86 MIL/uL — ABNORMAL LOW (ref 3.87–5.11)
RDW: 19.1 % — ABNORMAL HIGH (ref 11.5–15.5)
WBC: 8.4 10*3/uL (ref 4.0–10.5)

## 2017-04-17 MED ORDER — SODIUM CHLORIDE 0.45 % IV SOLN
INTRAVENOUS | Status: DC
  Administered 2017-04-17 – 2017-04-21 (×5): via INTRAVENOUS
  Filled 2017-04-17 (×8): qty 1000

## 2017-04-17 NOTE — Care Management Important Message (Deleted)
Important Message  Patient Details  Name: LULUBELLE SIMCOE MRN: [id number] Date of Birth: January 23, 1931   Medicare Important Message Given:  Yes    Kerin Salen 04/17/2017, 10:44 AMImportant Message  Patient Details  Name: MCKENSI REDINGER MRN: [id number] Date of Birth: Aug 09, 1930   Medicare Important Message Given:  Yes    Kerin Salen 04/17/2017, 10:44 AM

## 2017-04-17 NOTE — Progress Notes (Signed)
Palliative care progress note  Reason for consult: Goals of care in light of continued infection related to multiple wounds.  Chart reviewed and discussed with bedside RN.  I saw and examined Julia Floyd and she is more awake and interactive today.  Her daughter and grandson were at the bedside.  She was able to answer questions appropriately and carry on conversation with me.  We briefly discussed the fact that her wound is not improving and will continue to be source of infection even if she continues on long course of antibiotics.  I continued to stress with family that even if she displays some short term improvement, she remains with a long standing, unfixable problem.  Family that is present acknowledges concern but reports they will continue to "take it day by day."  Patient and daughter report no questions or concerns this evening.  Family meeting set up for Saturday at 1100.  Total time: 30 minutes Greater than 50%  of this time was spent counseling and coordinating care related to the above assessment and plan.  Micheline Rough, MD [facility] [phone]

## 2017-04-17 NOTE — Progress Notes (Signed)
   04/17/17 1300 Hydrotherapy treatment note 1220-1248  Subjective Assessment  Subjective awake at this time, states a few words  Patient and Family Stated Goals none stated  Date of Onset (unknown-POA 03/14/17)  Prior Treatments hydrotherapy initiated 03/17/17 last admission  Evaluation and Treatment  Evaluation and Treatment Procedures Explained to Patient/Family Yes  Evaluation and Treatment Procedures Patient unable to consent due to mental status  Pressure Injury 03/17/17 Stage IV - Full thickness tissue loss with exposed bone, tendon or muscle. *PT*  S/P I&D 03/16/17  Date First Assessed/Time First Assessed: 03/17/17 1340   Location: Sacrum  Staging: Stage IV - Full thickness tissue loss with exposed bone, tendon or muscle.  Wound Description (Comments): *PT*  S/P I&D 03/16/17  Present on Admission: Yes  Dressing Type ABD;Moist to dry (Santyl)  Dressing Changed  Dressing Change Frequency Twice a day  State of Healing Non-healing  Site / Wound Assessment Pink;Yellow;Brown  % Wound base Red or Granulating 50%  % Wound base Yellow/Fibrinous Exudate 40%  % Wound base Other/Granulation Tissue (Comment) 10% (bone and chips)  Peri-wound Assessment Erythema (blanchable)  Wound Length (cm) 15 cm  Wound Width (cm) 13 cm  Wound Depth (cm) 3 cm  Wound Surface Area (cm^2) 195 cm^2  Wound Volume (cm^3) 585 cm^3  Margins Unattached edges (unapproximated)  Drainage Amount Moderate  Drainage Description Serosanguineous, malodorous  Treatment Debridement (Selective);Packing (Saline gauze)  Hydrotherapy  Pulsed Lavage with Suction (psi) 8 psi  Pulsed Lavage with Suction - Normal Saline Used 1000 mL  Pulsed Lavage Tip Tip with splash shield  Pulsed lavage therapy - wound location sacral  Selective Debridement  Selective Debridement - Location sacrum  Selective Debridement - Tools Used Forceps;Scissors  Selective Debridement - Tissue Removed brown and yellow tissue  Wound Therapy -  Assess/Plan/Recommendations  Wound Therapy - Clinical Statement [patient is awake and sitting upright in the bed. Saying a few words.Patient found with large amount of loose BM contaminating the wound dressing. After treatment ended, nursing notified  to complete pericare. The wound  bed has bone and bone chips in the base that are very palpable. The slough in the bed is unchanged. Spoke to CCS who agrees with decreasing to MWF hydrotherapy.    Factors Delaying/Impairing Wound Healing Diabetes Mellitus;Incontinence;Immobility;Infection - systemic/local  Hydrotherapy Plan Debridement;Dressing change;Patient/family education;Pulsatile lavage with suction  Wound Therapy - Frequency 3X / week  Wound Therapy - Current Recommendations Case manager/social work  Wound Therapy - Follow Up Recommendations Skilled nursing facility  Wound Plan hydrotherapy decreased to M-W-F.  Wound Therapy Goals - Improve the function of patient's integumentary system by progressing the wound(s) through the phases of wound healing by:  Decrease Necrotic Tissue to 20  Decrease Necrotic Tissue - Progress Not progressing  Increase Granulation Tissue to 80  Increase Granulation Tissue - Progress not progressing  Goals/treatment plan/discharge plan were made with and agreed upon by patient/family No, Patient unable to participate in goals/treatment/discharge plan and family unavailable  Time For Goal Achievement 2 weeks  Wound Therapy - Potential for Goals Poor  Tresa Endo PT [phone]

## 2017-04-17 NOTE — Progress Notes (Addendum)
[facility]  PROGRESS NOTE  Julia Floyd:[id number] DOB: Jan 19, 1931 DOA: 04/08/2017 PCP: Josetta Huddle, MD   Brief HPI:   84 y.o. female with a history of CVA/ICH s/p TPA with residual deficits, HTN, T2DM and sacral decubitus ulcer requiring debridement followed by hydrotherapy recently discharged 03/30/2017 to SNF who returned from SNF for fever and AMS, having decreased per oral intake. IV vancomycin and zosyn given empirically for osteomyelitis. Cultures drawn and 2u PRBCs provided. Continued to have recurrent fevers. ID consulted, felt fevers would continue despite adequate treatment. Not significantly improving, palliative care consulted for further goals of care discussions.       Subjective   Patient seen and examined, denies any pain.   Assessment/Plan:     1. Severe sepsis due to osteomyelitis of sacrum- CT abdomen pelvis without contrast showed left-sided sacrum concerning for osteomyelities. IT was consulted and recommend continue with vancomycin and Zosyn. Patient continues to have intermittent fever, blood cultures showed no growth. Continue vancomycin.  Day # 8. ID and palliative care following. 2. History of CVA/ICH with dysphagia-residual deficits have left patient bedbound/wheelchair-bound. Continue dysphagia 2 diet.  3. Anasarca/hypoalbuminemia-albumin is 1.8, patient given albumin 25 g Q6 hours x 4. 4. Unstageable  sacral pressure ulcer- concern for [place] to mellitus, status post debridement on 12/17 her recent admission. Patient undergoing hydrotherapy, general surgery following. No need for repeat debridement. 5. Normocytic  anemia of chronic disease-ferritin 1477, hemoglobin 6.5 on admission. Up to 9.2 with two units P RBC. Today hemoglobin is 7.7,  ,follow CBC in a.m. 6. Thrombocytopenia-resolved likely due to linezolid given during previous admission. 7. Hypokalemia-resolved 8. Critical limb ischemia/peripheral arterial disease-complicated by bilateral   pressure wounds. ABI 0.37 on right, 0.28 on left. Vascular surgery Dr. Bridgett Larsson was consulted during previous admission on 12/28, at that time he felt constant measures were appropriate. Wound care per wound nurse. 9. Hypertension-  Norvasc on hold due to hypotension 10. Hyperchloremic metabolic acidosis- will change IV fluids to 1/2 normal saline+ 10 meq kcl at 50 ml/hr.    DVT prophylaxis: SCD, chemical DVT prophylaxis on hold due to recent ICH status post TPA six months ago  Code Status: Full code  Family Communication: No family present at bedside  Disposition Plan: to be  Determined, after discussion with palliative care   Consultants:  Palliative care  Procedures:  None   Continuous infusions . 0.9 % sodium chloride with kcl 50 mL/hr at 04/16/17 0520  . [START ON 04/18/2017] vancomycin        Antibiotics:   Anti-infectives (From admission, onward)   Start     Dose/Rate Route Frequency Ordered Stop   04/18/17 0800  vancomycin (VANCOCIN) IVPB 1000 mg/200 mL premix     1,000 mg 200 mL/hr over 60 Minutes Intravenous Every 36 hours 04/16/17 1016     04/09/17 0800  vancomycin (VANCOCIN) IVPB 1000 mg/200 mL premix  Status:  Discontinued     1,000 mg 200 mL/hr over 60 Minutes Intravenous Every 24 hours 04/08/17 2128 04/16/17 1016   04/09/17 0400  piperacillin-tazobactam (ZOSYN) IVPB 3.375 g  Status:  Discontinued     3.375 g 12.5 mL/hr over 240 Minutes Intravenous Every 8 hours 04/08/17 2128 04/16/17 2103   04/08/17 2115  vancomycin (VANCOCIN) IVPB 1000 mg/200 mL premix     1,000 mg 200 mL/hr over 60 Minutes Intravenous  Once 04/08/17 2104 04/08/17 2242   04/08/17 2115  piperacillin-tazobactam (ZOSYN) IVPB 3.375 g     3.375 g  100 mL/hr over 30 Minutes Intravenous  Once 04/08/17 2104 04/08/17 2156       Objective   Vitals:   04/16/17 2202 04/16/17 2300 04/17/17 0535 04/17/17 1252  BP: (!) 135/57  140/62 128/61  Pulse: 90  86 88  Resp:   20 18  Temp: 100 F (37.8  C) 99.1 F (37.3 C) 99.5 F (37.5 C) 98.7 F (37.1 C)  TempSrc: Oral Oral Oral Oral  SpO2: 100%  100% 100%  Weight:      Height:        Intake/Output Summary (Last 24 hours) at 04/17/2017 1329 Last data filed at 04/17/2017 0650 Gross per 24 hour  Intake 1350 ml  Output 850 ml  Net 500 ml   Filed Weights   04/08/17 1821 04/16/17 0342  Weight: 78.5 kg (173 lb) 77.2 kg (170 lb 3.1 oz)     Physical Examination:   Physical Exam: Eyes: No icterus, extraocular muscles intact  Mouth: Oral mucosa is moist, no lesions on palate,  Neck: Supple, no deformities, masses, or tenderness Lungs: Normal respiratory effort, bilateral clear to auscultation, no crackles or wheezes.  Heart: Regular rate and rhythm, S1 and S2 normal, no murmurs, rubs auscultated Abdomen: BS normoactive,soft,nondistended,non-tender to palpation,no organomegaly Extremities: No pretibial edema, no erythema, no cyanosis, no clubbing Neuro : Alert and oriented to time, place and person, No focal deficits       Data Reviewed: I have personally reviewed following labs and imaging studies  CBG: Recent Labs  Lab 04/12/17 0739  GLUCAP 76    CBC: Recent Labs  Lab 04/12/17 0334 04/16/17 0727 04/17/17 0517  WBC 9.6 8.0 8.4  HGB 8.3* 7.0* 7.7*  HCT 25.8* 21.6* 23.4*  MCV 83.2 82.8 81.8  PLT 202 157 062    Basic Metabolic Panel: Recent Labs  Lab 04/12/17 0334 04/13/17 0333 04/14/17 0314 04/15/17 0301 04/16/17 0727  NA 136 134* 135 137 140  K 3.3* 4.1 4.3 4.1 3.7  CL 108 108 111 115* 116*  CO2 21* 20* 20* 18* 17*  GLUCOSE 82 72 86 86 63*  BUN 18 14 14 12 11   CREATININE 0.76 0.74 0.82 0.87 0.92  CALCIUM 7.8* 7.7* 7.5* 7.7* 8.3*    Recent Results (from the past 240 hour(s))  Blood culture (routine x 2)     Status: None   Collection Time: 04/08/17  7:54 PM  Result Value Ref Range Status   Specimen Description BLOOD RIGHT ANTECUBITAL  Final   Special Requests   Final    BOTTLES DRAWN AEROBIC  AND ANAEROBIC Blood Culture adequate volume   Culture   Final    NO GROWTH 5 DAYS Performed at [facility], [address], [place], [place] [address]    Report Status 04/13/2017 FINAL  Final  Blood culture (routine x 2)     Status: None   Collection Time: 04/08/17  7:56 PM  Result Value Ref Range Status   Specimen Description BLOOD LEFT ARM  Final   Special Requests   Final    BOTTLES DRAWN AEROBIC AND ANAEROBIC Blood Culture results may not be optimal due to an inadequate volume of blood received in culture bottles   Culture   Final    NO GROWTH 5 DAYS Performed at [facility], [place] [address], [place], [place] [address]    Report Status 04/13/2017 FINAL  Final  MRSA PCR Screening     Status: None   Collection Time: 04/10/17 12:45  AM  Result Value Ref Range Status   MRSA by PCR NEGATIVE NEGATIVE Final    Comment:        The GeneXpert MRSA Assay (FDA approved for NASAL specimens only), is one component of a comprehensive MRSA colonization surveillance program. It is not intended to diagnose MRSA infection nor to guide or monitor treatment for MRSA infections.   Culture, Urine     Status: None   Collection Time: 04/10/17  5:50 AM  Result Value Ref Range Status   Specimen Description URINE, CATHETERIZED  Final   Special Requests NONE  Final   Culture   Final    NO GROWTH Performed at [facility], [address] [place], [place] [address]    Report Status 04/11/2017 FINAL  Final     Liver Function Tests: No results for input(s): AST, ALT, ALKPHOS, BILITOT, PROT, ALBUMIN in the last 168 hours. No results for input(s): LIPASE, AMYLASE in the last 168 hours. No results for input(s): AMMONIA in the last 168 hours.  Cardiac Enzymes: No results for input(s): CKTOTAL, CKMB, CKMBINDEX, TROPONINI in the last 168 hours. BNP (last 3 results) No results for input(s): BNP in the last 8760 hours.  ProBNP (last 3 results) No results for input(s): PROBNP in  the last 8760 hours.    Studies: No results found.  Scheduled Meds: . collagenase   Topical BID  . feeding supplement (ENSURE ENLIVE)  237 mL Oral BID BM  . multivitamin with minerals  1 tablet Oral Daily  . nutrition supplement (JUVEN)  1 packet Oral BID BM      Time spent: 20 min  [facility] Pager [phone]. If 7PM-7AM, please contact night-coverage at [email or web address], Office  [phone]  password [email or web address]  04/17/2017, 1:29 PM  LOS: 9 days

## 2017-04-17 NOTE — Care Management Important Message (Signed)
Important Message  Patient Details  Name: Julia Floyd MRN: [id number] Date of Birth: 1930/06/10   Medicare Important Message Given:  Yes    Kerin Salen 04/17/2017, 10:40 AMImportant Message  Patient Details  Name: Julia Floyd MRN: [id number] Date of Birth: 1931/02/14   Medicare Important Message Given:  Yes    Kerin Salen 04/17/2017, 10:40 AM

## 2017-04-17 NOTE — Care Management Note (Signed)
Case Management Note  Patient Details  Name: Julia Floyd MRN: [id number] Date of Birth: Nov 15, 1930  Subjective/Objective:Transfer from SDU. From SNF-CSW already following. Palliative following.                    Action/Plan:d/c SNF   Expected Discharge Date:  (unknown)               Expected Discharge Plan:  [place]  In-House Referral:  Clinical Social Work  Discharge planning Services  CM Consult  Post Acute Care Choice:    Choice offered to:     DME Arranged:    DME Agency:     HH Arranged:    [place] Agency:     Status of Service:  In process, will continue to follow  If discussed at Long Length of Stay Meetings, dates discussed:    Additional Comments:  Dessa Phi, RN 04/17/2017, 2:53 PM

## 2017-04-18 LAB — CBC
HEMATOCRIT: 24.6 % — AB (ref 36.0–46.0)
HEMOGLOBIN: 8 g/dL — AB (ref 12.0–15.0)
MCH: 26.7 pg (ref 26.0–34.0)
MCHC: 32.5 g/dL (ref 30.0–36.0)
MCV: 82 fL (ref 78.0–100.0)
Platelets: 125 10*3/uL — ABNORMAL LOW (ref 150–400)
RBC: 3 MIL/uL — AB (ref 3.87–5.11)
RDW: 19.3 % — ABNORMAL HIGH (ref 11.5–15.5)
WBC: 9.1 10*3/uL (ref 4.0–10.5)

## 2017-04-18 LAB — BASIC METABOLIC PANEL
ANION GAP: 5 (ref 5–15)
BUN: 16 mg/dL (ref 6–20)
CHLORIDE: 115 mmol/L — AB (ref 101–111)
CO2: 17 mmol/L — AB (ref 22–32)
CREATININE: 0.8 mg/dL (ref 0.44–1.00)
Calcium: 8.2 mg/dL — ABNORMAL LOW (ref 8.9–10.3)
GFR calc Af Amer: >60 mL/min (ref >60)
GFR calc non Af Amer: >60 mL/min (ref >60)
Glucose, Bld: 99 mg/dL (ref 65–99)
Potassium: 3.6 mmol/L (ref 3.5–5.1)
SODIUM: 137 mmol/L (ref 135–145)

## 2017-04-18 MED ORDER — PIPERACILLIN-TAZOBACTAM 3.375 G IVPB
3.3750 g | Freq: Three times a day (TID) | INTRAVENOUS | Status: DC
  Administered 2017-04-18 – 2017-04-22 (×12): 3.375 g via INTRAVENOUS
  Filled 2017-04-18 (×13): qty 50

## 2017-04-18 NOTE — Progress Notes (Signed)
[facility]  PROGRESS NOTE  Julia Floyd EXB:[id number] DOB: Dec 29, 1930 DOA: 04/08/2017 PCP: Josetta Huddle, MD   Brief HPI:   84 y.o. female with a history of CVA/ICH s/p TPA with residual deficits, HTN, T2DM and sacral decubitus ulcer requiring debridement followed by hydrotherapy recently discharged 03/30/2017 to SNF who returned from SNF for fever and AMS, having decreased per oral intake. IV vancomycin and zosyn given empirically for osteomyelitis. Cultures drawn and 2u PRBCs provided. Continued to have recurrent fevers. ID consulted, felt fevers would continue despite adequate treatment. Not significantly improving, palliative care consulted for further goals of care discussions.    Subjective   Patient in bed, appears comfortable, denies any headache, no fever, no chest pain or pressure, no shortness of breath , no abdominal pain. No focal weakness.   Assessment/Plan:     1. Severe sepsis due to osteomyelitis of sacrum- CT abdomen pelvis without contrast showed left-sided sacrum concerning for osteomyelities. ID was consulted case discussed with Dr. Graylon Good on 04/18/2017, plan is to place a PICC line, total 6 weeks of IV vancomycin and Zosyn with a 4-week follow-up with ID in the office, palliative care also involved, long-term prognosis extremely poor due to patient's advanced age, malnutrition and bedbound status, family explained in detail however at this time they want to pursue aggressive medical care and want to keep her full code. 2. History of CVA/ICH with dysphagia-residual deficits have left patient bedbound/wheelchair-bound. Continue dysphagia 2 diet.  3. Anasarca/hypoalbuminemia-albumin is 1.8, patient given albumin 25 g Q6 hours x 4. 4. Unstageable  sacral pressure ulcer- concern for [place] to mellitus, status post debridement on 12/17 her recent admission. Patient undergoing hydrotherapy, general surgery following. No need for repeat debridement. 5. Normocytic  anemia  of chronic disease-ferritin 1477, hemoglobin 6.5 on admission. Up to 9.2 with two units P RBC. Today hemoglobin is 7.7,  ,follow CBC in a.m. 6. Thrombocytopenia-resolved likely due to linezolid given during previous admission. 7. Hypokalemia-resolved 8. Peripheral arterial disease-complicated by bilateral  pressure wounds. ABI 0.37 on right, 0.28 on left. Vascular surgery Dr. Bridgett Larsson was consulted during previous admission on 12/28, at that time he felt constant measures were appropriate. Wound care per wound nurse. 9. Hypertension-  Norvasc on hold due to hypotension 10. Hyperchloremic metabolic acidosis- will change IV fluids to 1/2 normal saline+ 10 meq kcl at 50 ml/hr.    DVT prophylaxis: SCD, chemical DVT prophylaxis on hold due to recent ICH status post TPA six months ago  Code Status: Full code  Family Communication: No family present at bedside  Disposition Plan: Likely SNF in the morning   Consultants:  Palliative care  Procedures:  None   Continuous infusions . piperacillin-tazobactam (ZOSYN)  IV    . sodium chloride 0.45 % 1,000 mL with potassium chloride 10 mEq infusion 50 mL/hr at 04/17/17 1453  . vancomycin        Antibiotics:   Anti-infectives (From admission, onward)   Start     Dose/Rate Route Frequency Ordered Stop   04/18/17 1200  piperacillin-tazobactam (ZOSYN) IVPB 3.375 g     3.375 g 12.5 mL/hr over 240 Minutes Intravenous Every 8 hours 04/18/17 0957     04/18/17 0800  vancomycin (VANCOCIN) IVPB 1000 mg/200 mL premix     1,000 mg 200 mL/hr over 60 Minutes Intravenous Every 36 hours 04/16/17 1016     04/09/17 0800  vancomycin (VANCOCIN) IVPB 1000 mg/200 mL premix  Status:  Discontinued     1,000 mg 200  mL/hr over 60 Minutes Intravenous Every 24 hours 04/08/17 2128 04/16/17 1016   04/09/17 0400  piperacillin-tazobactam (ZOSYN) IVPB 3.375 g  Status:  Discontinued     3.375 g 12.5 mL/hr over 240 Minutes Intravenous Every 8 hours 04/08/17 2128 04/16/17  2103   04/08/17 2115  vancomycin (VANCOCIN) IVPB 1000 mg/200 mL premix     1,000 mg 200 mL/hr over 60 Minutes Intravenous  Once 04/08/17 2104 04/08/17 2242   04/08/17 2115  piperacillin-tazobactam (ZOSYN) IVPB 3.375 g     3.375 g 100 mL/hr over 30 Minutes Intravenous  Once 04/08/17 2104 04/08/17 2156       Objective   Vitals:   04/17/17 0535 04/17/17 1252 04/17/17 2045 04/18/17 0434  BP: 140/62 128/61 122/60 (!) 138/50  Pulse: 86 88 95 90  Resp: 20 18 18 18   Temp: 99.5 F (37.5 C) 98.7 F (37.1 C) 98.5 F (36.9 C) 98.4 F (36.9 C)  TempSrc: Oral Oral Oral Oral  SpO2: 100% 100% 100% 100%  Weight:      Height:        Intake/Output Summary (Last 24 hours) at 04/18/2017 1213 Last data filed at 04/18/2017 5573 Gross per 24 hour  Intake 15.83 ml  Output 150 ml  Net -134.17 ml   Filed Weights   04/08/17 1821 04/16/17 0342  Weight: 78.5 kg (173 lb) 77.2 kg (170 lb 3.1 oz)     Physical Examination:  Awake, pleasantly confused, No new F.N deficits, Normal affect [place].AT,PERRAL Supple Neck,No JVD, No cervical lymphadenopathy appriciated.  Symmetrical Chest wall movement, Good air movement bilaterally, CTAB RRR,No Gallops, Rubs or new Murmurs, No Parasternal Heave +ve B.Sounds, Abd Soft, No tenderness, No organomegaly appriciated, No rebound - guarding or rigidity.  Indwelling Foley catheter in place No Cyanosis, Clubbing or edema, No new Rash or bruise, kindly evaluate wound care note for sacral decubitus ulcer    Data Reviewed: I have personally reviewed following labs and imaging studies  CBG: Recent Labs  Lab 04/12/17 0739  GLUCAP 76    CBC: Recent Labs  Lab 04/12/17 0334 04/16/17 0727 04/17/17 0517 04/18/17 0628  WBC 9.6 8.0 8.4 9.1  HGB 8.3* 7.0* 7.7* 8.0*  HCT 25.8* 21.6* 23.4* 24.6*  MCV 83.2 82.8 81.8 82.0  PLT 202 157 151 125*    Basic Metabolic Panel: Recent Labs  Lab 04/13/17 0333 04/14/17 0314 04/15/17 0301 04/16/17 0727 04/18/17 0628   NA 134* 135 137 140 137  K 4.1 4.3 4.1 3.7 3.6  CL 108 111 115* 116* 115*  CO2 20* 20* 18* 17* 17*  GLUCOSE 72 86 86 63* 99  BUN 14 14 12 11 16   CREATININE 0.74 0.82 0.87 0.92 0.80  CALCIUM 7.7* 7.5* 7.7* 8.3* 8.2*    Recent Results (from the past 240 hour(s))  Blood culture (routine x 2)     Status: None   Collection Time: 04/08/17  7:54 PM  Result Value Ref Range Status   Specimen Description BLOOD RIGHT ANTECUBITAL  Final   Special Requests   Final    BOTTLES DRAWN AEROBIC AND ANAEROBIC Blood Culture adequate volume   Culture   Final    NO GROWTH 5 DAYS Performed at [facility], [address] [place], [place] [address]    Report Status 04/13/2017 FINAL  Final  Blood culture (routine x 2)     Status: None   Collection Time: 04/08/17  7:56 PM  Result Value Ref Range Status   Specimen Description  BLOOD LEFT ARM  Final   Special Requests   Final    BOTTLES DRAWN AEROBIC AND ANAEROBIC Blood Culture results may not be optimal due to an inadequate volume of blood received in culture bottles   Culture   Final    NO GROWTH 5 DAYS Performed at [facility], [place] [address], [place], [place] [address]    Report Status 04/13/2017 FINAL  Final  MRSA PCR Screening     Status: None   Collection Time: 04/10/17 12:45 AM  Result Value Ref Range Status   MRSA by PCR NEGATIVE NEGATIVE Final    Comment:        The GeneXpert MRSA Assay (FDA approved for NASAL specimens only), is one component of a comprehensive MRSA colonization surveillance program. It is not intended to diagnose MRSA infection nor to guide or monitor treatment for MRSA infections.   Culture, Urine     Status: None   Collection Time: 04/10/17  5:50 AM  Result Value Ref Range Status   Specimen Description URINE, CATHETERIZED  Final   Special Requests NONE  Final   Culture   Final    NO GROWTH Performed at [facility], [address] [place], [place] [address]    Report Status 04/11/2017  FINAL  Final     Liver Function Tests: No results for input(s): AST, ALT, ALKPHOS, BILITOT, PROT, ALBUMIN in the last 168 hours. No results for input(s): LIPASE, AMYLASE in the last 168 hours. No results for input(s): AMMONIA in the last 168 hours.  Cardiac Enzymes: No results for input(s): CKTOTAL, CKMB, CKMBINDEX, TROPONINI in the last 168 hours. BNP (last 3 results) No results for input(s): BNP in the last 8760 hours.  ProBNP (last 3 results) No results for input(s): PROBNP in the last 8760 hours.    Studies: No results found.  Scheduled Meds: . collagenase   Topical BID  . feeding supplement (ENSURE ENLIVE)  237 mL Oral BID BM  . multivitamin with minerals  1 tablet Oral Daily  . nutrition supplement (JUVEN)  1 packet Oral BID BM      Time spent: 20 min  Signature  Lala Lund M.D on 04/18/2017 at 12:13 PM  Between 7am to 7pm - Pager - [phone] ( page via [email or web address], text pages only, please mention full 10 digit call back number).  After 7pm go to [email or web address] - password [email or web address]

## 2017-04-18 NOTE — Progress Notes (Signed)
Palliative care progress note  Reason for consult: Goals of care in light of nonhealing wounds with recurrent infection  I met today with Ms. Digiacomo her daughter Jeanne Ivan), granddaughter Yvetta Coder), and grandson Nicole Kindred).   Family remembers meeting with me to discuss long-term goals back in December.  We reviewed that the most important things to Ms. Sramek are family and her faith.  Since that visit, she has been at [facility] place with little progress in healing of her wound.  She was brought back to [facility] long due to the fact that she became altered and septic.  She has completed advanced care documentation naming her daughter Jeanne Ivan) and granddaughter Yvetta Coder) as her surrogate decision makers  We discussed again prior conversations regarding the fact that she has a non-healable wound on her backside.    And the fact that she was only able to be out of the hospital for a very short period of time after her antibiotics were discontinued.  We also reviewed the fact that she has been receiving aggressive wound care with no real improvement in the wound itself.  We talked about options moving forward including continuation of medical care versus a focus on comfort.  Discussed at length that while she has improved this hospitalization on antibiotics, this is something that is going to recur as soon as they are stopped, even if she pursues a long course of antibiotics (up to 6 weeks per family report of what they have been hearing from other physicians).  We discussed options for continuation of wound care and why long-term antibiotics are unlikely to be beneficial as this is not going to heal.  We also discussed some regarding hydrotherapy and how this can be a painful procedure.  I made recommendation to discontinue hydrotherapy as does not appear that it is helping with wound healing.  I also discussed with them regarding CODE STATUS and the goal of medical interventions being to allow  her to be well enough to leave the hospital in a meaningful way and how if she suffers from cardiac or respiratory arrest these interventions are likely to result in her maintaining her current quality of life (which she reports is already significantly compromised from what she finds acceptable).  We reviewed a MOST form and discussed how to develop plan of care to focus on continuing therapies that would maximize chance of being well enough to return home and limiting therapies not in line with this goal.  Overall, her family reports understanding the complexity of her situation but also report needing some time to speak as a family to figure out what is the best steps moving forward.  I provided copies of Hard Choices for [place] for her family to review.  I recommend that palliative care follow her at skilled facility.  Please include this on the discharge summary if you feel it is appropriate.  Questions and concerns addressed. PMT will continue to support holistically.  I will be happy to meet again with family prior to discharge if they would like to discuss further or complete a most form.  Family has my card to call if I can be of further assistance.  Total time: 70 minutes Greater than 50% of this time was spent counseling and coordinating care related to the above assessment and plan. Extended billing time  Micheline Rough, MD [place] Team [phone]

## 2017-04-18 NOTE — Progress Notes (Signed)
Daughter, Jeanne Ivan, notified of unable to place PICC and that Dr. Candiss Norse plans to refer to IR.

## 2017-04-18 NOTE — Progress Notes (Signed)
Peripherally Inserted Central Catheter/Midline Placement  The IV Nurse has discussed with the patient and/or persons authorized to consent for the patient, the purpose of this procedure and the potential benefits and risks involved with this procedure.  The benefits include less needle sticks, lab draws from the catheter, and the patient may be discharged home with the catheter. Risks include, but not limited to, infection, bleeding, blood clot (thrombus formation), and puncture of an artery; nerve damage and irregular heartbeat and possibility to perform a PICC exchange if needed/ordered by physician.  Alternatives to this procedure were also discussed.  [facility] PICC patient education guide, fact sheet on infection prevention and patient information card has been provided to patient /or left at bedside.  Consent obtained from daughter via telephone.  PICC/Midline Placement Documentation     Assessed BUE for PICC placement.  With tourniquet still on, veins measured, best of ability to access, at 40% to 58%.  Unable to rotate or abduct BUE for full assessment.  Pt also unable to move at shoulders, with evidence of pain with attempts to move, crying out and wincing. Would recommend IR placement with possible IJ approach due to contractures and future difficulty with dressing changes/ line care if in UE.  Dr Ronnie Derby notified of the above   Rolena Infante 04/18/2017, 5:48 PM

## 2017-04-19 MED ORDER — SODIUM CHLORIDE 0.9 % IV SOLN
1250.0000 mg | INTRAVENOUS | Status: DC
  Filled 2017-04-19: qty 1250

## 2017-04-19 MED ORDER — HEPARIN SODIUM (PORCINE) 5000 UNIT/ML IJ SOLN
5000.0000 [IU] | Freq: Three times a day (TID) | INTRAMUSCULAR | Status: DC
Start: 2017-04-19 — End: 2017-04-19
  Administered 2017-04-19: 5000 [IU] via SUBCUTANEOUS
  Filled 2017-04-19: qty 1

## 2017-04-19 MED ORDER — SODIUM CHLORIDE 0.9 % IV BOLUS (SEPSIS)
500.0000 mL | Freq: Once | INTRAVENOUS | Status: AC
  Administered 2017-04-19: 500 mL via INTRAVENOUS

## 2017-04-19 NOTE — Progress Notes (Signed)
Pt with minimal urine output via foley charted today. Pt has only had 100 ml output during this shift. Bladder scan reveals 0 ml. On call made aware. New orders placed for 500 ml bolus. Will continue to monitor closely.

## 2017-04-19 NOTE — Progress Notes (Signed)
Clarified VTE prophylaxis order with MD. Md with order to continue SCD. MD with order for 500cc bolus NS, no need to update for decreased urine output to to age and status

## 2017-04-19 NOTE — Progress Notes (Signed)
Palliative care progress note  Reason for consult: Goals of care in light of nonhealing wounds with recurrent infection  I met today with Julia Floyd. No family present at the bedside.  She works remembering our meeting from the other day and states that this is still something that she is continuing to talk about with her family.  We discussed again concern that long-term antibiotics are unlikely to be beneficial as wound is not going to heal.    Questions and concerns addressed. PMT will continue to support holistically.  I will be happy to meet again with family prior to discharge if they would like to discuss further or complete a most form.  Family has my card to call if I can be of further assistance.  Total time: 20 minutes Greater than 50% of this time was spent counseling and coordinating care related to the above assessment and plan.  Micheline Rough, MD [place] Team [phone]

## 2017-04-19 NOTE — Progress Notes (Signed)
Pharmacy Antibiotic Note  Julia Floyd is a 84 y.o. female admitted on 04/08/2017 with sepsis, recurrent fevers, sacral osteomyelitis.  Pharmacy has been consulted for vancomycin and zosyn dosing.  Plan:  Due to patient going home with vanc, current dose schedule of 1g IV q36 would not be appropriate for home use, so will change dose from that to 1250mg  IV q48 which will give a calculated AUC based on peak and trough drawn on 1/16 and 1/17 of 475. First dose of vanc 1250mg  IV q48 will be tomorrow 1/21 at 0800  Continue Zosyn 3.375g IV q8h (4 hour infusion time).   Daily SCr.  Note plan for patient to get PICC line tomorrow 1/21 then be discharged home to complete 6 weeks total of Vanc/Zosyn - await OPAT consult  Height: 5\' 6"  (167.6 cm) Weight: 170 lb 3.1 oz (77.2 kg) IBW/kg (Calculated) : 59.3  Temp (24hrs), Avg:98.3 F (36.8 C), Min:97.8 F (36.6 C), Max:99 F (37.2 C)  Recent Labs  Lab 04/13/17 0333 04/14/17 0314 04/15/17 0301 04/15/17 1138 04/16/17 0727 04/17/17 0517 04/18/17 0628  WBC  --   --   --   --  8.0 8.4 9.1  CREATININE 0.74 0.82 0.87  --  0.92  --  0.80  VANCOTROUGH  --   --   --   --  24*  --   --   VANCOPEAK  --   --   --  40  --   --   --     Estimated Creatinine Clearance: 55 mL/min (by C-G formula based on SCr of 0.8 mg/dL).    No Known Allergies  Antimicrobials this admission: 1/9 Vanc >>  1/9 Zosyn >>   Dose adjustments this admission: 1/17 Vancomycin 1gm q24 to q36 hr 1/20: Vanc 1g q36 to 1250mg  q48  Microbiology results: 1/9BCx: NGF 1/11 UCx: NGF 1/11 MRSA PCR: neg   Thank you for allowing pharmacy to be a part of this patient's care.   Adrian Saran, PharmD, BCPS Pager [phone] 04/19/2017 12:44 PM

## 2017-04-19 NOTE — Progress Notes (Signed)
Attempted to feed pt. Pt  "not hungry". Offered pudding. Pt took one bite and refused more. Offered other options. Pt refused. Will cont to mon and offer meals and snacks

## 2017-04-19 NOTE — Progress Notes (Signed)
[facility]  PROGRESS NOTE  Julia Floyd ERD:[id number] DOB: September 18, 1930 DOA: 04/08/2017 PCP: Josetta Huddle, MD   Brief HPI:   85 y.o. female with a history of CVA/ICH s/p TPA with residual deficits, HTN, T2DM and sacral decubitus ulcer requiring debridement followed by hydrotherapy recently discharged 03/30/2017 to SNF who returned from SNF for fever and AMS, having decreased per oral intake. IV vancomycin and zosyn given empirically for osteomyelitis. Cultures drawn and 2u PRBCs provided. Continued to have recurrent fevers. ID consulted, felt fevers would continue despite adequate treatment. Not significantly improving, palliative care consulted for further goals of care discussions.    Subjective   Patient in bed, appears comfortable, denies any headache, no fever, no chest pain or pressure, no shortness of breath , no abdominal pain. No focal weakness.   Assessment/Plan:     1. Severe sepsis due to osteomyelitis of sacrum- CT abdomen pelvis without contrast showed left-sided sacrum concerning for osteomyelities. ID was consulted case discussed with Dr. Graylon Good on 04/18/2017, plan is to place a PICC line which IR on Monday, total 6 weeks of IV vancomycin and Zosyn with a 4-week follow-up with ID in the office, palliative care also involved, long-term prognosis extremely poor due to patient's advanced age, malnutrition and bedbound status, family explained in detail however at this time they want to pursue aggressive medical care and want to keep her full code.  Monday. 2. History of CVA/ICH with dysphagia-residual deficits have left patient bedbound/wheelchair-bound. Continue dysphagia 2 diet.  3. Anasarca/hypoalbuminemia-albumin is 1.8, patient given albumin 25 g Q6 hours x 4. 4. Unstageable  sacral pressure ulcer- concern for [place] to mellitus, status post debridement on 12/17 her recent admission. Patient undergoing hydrotherapy, general surgery following. No need for repeat  debridement. 5. Normocytic  anemia of chronic disease-ferritin 1477, hemoglobin 6.5 on admission. Up to 9.2 with two units P RBC. Today hemoglobin is 7.7,  ,follow CBC in a.m. 6. Thrombocytopenia-resolved likely due to linezolid given during previous admission. 7. Hypokalemia-resolved 8. Peripheral arterial disease-complicated by bilateral  pressure wounds. ABI 0.37 on right, 0.28 on left. Vascular surgery Dr. Bridgett Larsson was consulted during previous admission on 12/28, at that time he felt constant measures were appropriate. Wound care per wound nurse. 9. Hypertension-  Norvasc on hold due to hypotension 10. Hyperchloremic metabolic acidosis- will change IV fluids to 1/2 normal saline+ 10 meq kcl at 50 ml/hr.    DVT prophylaxis: SCD, chemical DVT prophylaxis on hold due to recent ICH status post TPA six months ago  Code Status: Full code  Family Communication: Family updated bedside on 04/18/2017 by me.    Disposition Plan: Likely SNF Monday morning after PICC line placed   Consultants:  Palliative care  Procedures:  None   Continuous infusions . piperacillin-tazobactam (ZOSYN)  IV Stopped (04/19/17 0950)  . sodium chloride 0.45 % 1,000 mL with potassium chloride 10 mEq infusion 50 mL/hr at 04/19/17 0550  . sodium chloride    . vancomycin Stopped (04/18/17 1100)    Antibiotics:   Anti-infectives (From admission, onward)   Start     Dose/Rate Route Frequency Ordered Stop   04/18/17 1200  piperacillin-tazobactam (ZOSYN) IVPB 3.375 g     3.375 g 12.5 mL/hr over 240 Minutes Intravenous Every 8 hours 04/18/17 0957     04/18/17 0800  vancomycin (VANCOCIN) IVPB 1000 mg/200 mL premix     1,000 mg 200 mL/hr over 60 Minutes Intravenous Every 36 hours 04/16/17 1016     04/09/17 0800  vancomycin (VANCOCIN) IVPB 1000 mg/200 mL premix  Status:  Discontinued     1,000 mg 200 mL/hr over 60 Minutes Intravenous Every 24 hours 04/08/17 2128 04/16/17 1016   04/09/17 0400  piperacillin-tazobactam  (ZOSYN) IVPB 3.375 g  Status:  Discontinued     3.375 g 12.5 mL/hr over 240 Minutes Intravenous Every 8 hours 04/08/17 2128 04/16/17 2103   04/08/17 2115  vancomycin (VANCOCIN) IVPB 1000 mg/200 mL premix     1,000 mg 200 mL/hr over 60 Minutes Intravenous  Once 04/08/17 2104 04/08/17 2242   04/08/17 2115  piperacillin-tazobactam (ZOSYN) IVPB 3.375 g     3.375 g 100 mL/hr over 30 Minutes Intravenous  Once 04/08/17 2104 04/08/17 2156       Objective   Vitals:   04/18/17 0434 04/18/17 1300 04/18/17 2040 04/19/17 0618  BP: (!) 138/50 (!) 112/44 (!) 105/54 (!) 126/45  Pulse: 90 92 (!) 105 93  Resp: 18 16 16 16   Temp: 98.4 F (36.9 C) 97.8 F (36.6 C) 98 F (36.7 C) 99 F (37.2 C)  TempSrc: Oral Oral Oral Oral  SpO2: 100% 100% 100% 100%  Weight:      Height:        Intake/Output Summary (Last 24 hours) at 04/19/2017 1155 Last data filed at 04/19/2017 1000 Gross per 24 hour  Intake 1376.67 ml  Output 200 ml  Net 1176.67 ml   Filed Weights   04/08/17 1821 04/16/17 0342  Weight: 78.5 kg (173 lb) 77.2 kg (170 lb 3.1 oz)     Physical Examination:  Awake, pleasantly confused, No new F.N deficits, Normal affect [place].AT,PERRAL Supple Neck,No JVD, No cervical lymphadenopathy appriciated.  Symmetrical Chest wall movement, Good air movement bilaterally, CTAB RRR,No Gallops, Rubs or new Murmurs, No Parasternal Heave +ve B.Sounds, Abd Soft, No tenderness, No organomegaly appriciated, No rebound - guarding or rigidity.  Indwelling Foley catheter in place, light yellow urine in the bag No Cyanosis, Clubbing or edema, No new Rash or bruise, kindly evaluate wound care note for sacral decubitus ulcer    Data Reviewed: I have personally reviewed following labs and imaging studies  CBG: No results for input(s): GLUCAP in the last 168 hours.  CBC: Recent Labs  Lab 04/16/17 0727 04/17/17 0517 04/18/17 0628  WBC 8.0 8.4 9.1  HGB 7.0* 7.7* 8.0*  HCT 21.6* 23.4* 24.6*  MCV 82.8 81.8  82.0  PLT 157 151 125*    Basic Metabolic Panel: Recent Labs  Lab 04/13/17 0333 04/14/17 0314 04/15/17 0301 04/16/17 0727 04/18/17 0628  NA 134* 135 137 140 137  K 4.1 4.3 4.1 3.7 3.6  CL 108 111 115* 116* 115*  CO2 20* 20* 18* 17* 17*  GLUCOSE 72 86 86 63* 99  BUN 14 14 12 11 16   CREATININE 0.74 0.82 0.87 0.92 0.80  CALCIUM 7.7* 7.5* 7.7* 8.3* 8.2*    Recent Results (from the past 240 hour(s))  MRSA PCR Screening     Status: None   Collection Time: 04/10/17 12:45 AM  Result Value Ref Range Status   MRSA by PCR NEGATIVE NEGATIVE Final    Comment:        The GeneXpert MRSA Assay (FDA approved for NASAL specimens only), is one component of a comprehensive MRSA colonization surveillance program. It is not intended to diagnose MRSA infection nor to guide or monitor treatment for MRSA infections.   Culture, Urine     Status: None   Collection Time: 04/10/17  5:50 AM  Result  Value Ref Range Status   Specimen Description URINE, CATHETERIZED  Final   Special Requests NONE  Final   Culture   Final    NO GROWTH Performed at [facility], [address] [place], [place] [address]    Report Status 04/11/2017 FINAL  Final     Liver Function Tests: No results for input(s): AST, ALT, ALKPHOS, BILITOT, PROT, ALBUMIN in the last 168 hours. No results for input(s): LIPASE, AMYLASE in the last 168 hours. No results for input(s): AMMONIA in the last 168 hours.  Cardiac Enzymes: No results for input(s): CKTOTAL, CKMB, CKMBINDEX, TROPONINI in the last 168 hours. BNP (last 3 results) No results for input(s): BNP in the last 8760 hours.  ProBNP (last 3 results) No results for input(s): PROBNP in the last 8760 hours.    Studies: No results found.  Scheduled Meds: . collagenase   Topical BID  . feeding supplement (ENSURE ENLIVE)  237 mL Oral BID BM  . multivitamin with minerals  1 tablet Oral Daily  . nutrition supplement (JUVEN)  1 packet Oral BID BM       Time spent: 20 min  Signature  Lala Lund M.D on 04/19/2017 at 11:55 AM  Between 7am to 7pm - Pager - [phone] ( page via [email or web address], text pages only, please mention full 10 digit call back number).  After 7pm go to [email or web address] - password [facility]

## 2017-04-19 NOTE — Progress Notes (Signed)
Bladder scanned 53ml urine. No bladder distention noted. No pain with palpation. Will con to mon

## 2017-04-20 ENCOUNTER — Inpatient Hospital Stay (HOSPITAL_COMMUNITY): Payer: Medicare Other

## 2017-04-20 LAB — URINALYSIS, ROUTINE W REFLEX MICROSCOPIC
BILIRUBIN URINE: NEGATIVE
Glucose, UA: NEGATIVE mg/dL
Ketones, ur: 5 mg/dL — AB
NITRITE: NEGATIVE
PH: 5 (ref 5.0–8.0)
Protein, ur: 100 mg/dL — AB
SPECIFIC GRAVITY, URINE: 1.019 (ref 1.005–1.030)

## 2017-04-20 LAB — BASIC METABOLIC PANEL
Anion gap: 5 (ref 5–15)
BUN: 21 mg/dL — AB (ref 6–20)
CHLORIDE: 115 mmol/L — AB (ref 101–111)
CO2: 16 mmol/L — AB (ref 22–32)
Calcium: 8.2 mg/dL — ABNORMAL LOW (ref 8.9–10.3)
Creatinine, Ser: 1.45 mg/dL — ABNORMAL HIGH (ref 0.44–1.00)
GFR calc Af Amer: 37 mL/min — ABNORMAL LOW (ref >60)
GFR calc non Af Amer: 32 mL/min — ABNORMAL LOW (ref >60)
GLUCOSE: 80 mg/dL (ref 65–99)
Potassium: 3.8 mmol/L (ref 3.5–5.1)
Sodium: 136 mmol/L (ref 135–145)

## 2017-04-20 LAB — PREPARE RBC (CROSSMATCH)

## 2017-04-20 LAB — CREATININE, URINE, RANDOM: CREATININE, URINE: 118.2 mg/dL

## 2017-04-20 LAB — CBC
HEMATOCRIT: 23 % — AB (ref 36.0–46.0)
Hemoglobin: 7.6 g/dL — ABNORMAL LOW (ref 12.0–15.0)
MCH: 26.9 pg (ref 26.0–34.0)
MCHC: 33 g/dL (ref 30.0–36.0)
MCV: 81.3 fL (ref 78.0–100.0)
Platelets: 102 10*3/uL — ABNORMAL LOW (ref 150–400)
RBC: 2.83 MIL/uL — ABNORMAL LOW (ref 3.87–5.11)
RDW: 19.8 % — AB (ref 11.5–15.5)
WBC: 9.7 10*3/uL (ref 4.0–10.5)

## 2017-04-20 LAB — HEMOGLOBIN AND HEMATOCRIT, BLOOD
HCT: 29 % — ABNORMAL LOW (ref 36.0–46.0)
Hemoglobin: 9.6 g/dL — ABNORMAL LOW (ref 12.0–15.0)

## 2017-04-20 LAB — SODIUM, URINE, RANDOM: Sodium, Ur: 23 mmol/L

## 2017-04-20 LAB — OSMOLALITY, URINE: OSMOLALITY UR: 364 mosm/kg (ref 300–900)

## 2017-04-20 MED ORDER — SODIUM CHLORIDE 0.9 % IV SOLN
1250.0000 mg | INTRAVENOUS | Status: DC
  Administered 2017-04-20: 1250 mg via INTRAVENOUS
  Filled 2017-04-20 (×2): qty 1250

## 2017-04-20 MED ORDER — FUROSEMIDE 10 MG/ML IJ SOLN
20.0000 mg | Freq: Once | INTRAMUSCULAR | Status: AC
  Administered 2017-04-20: 20 mg via INTRAVENOUS
  Filled 2017-04-20: qty 2

## 2017-04-20 MED ORDER — DIPHENHYDRAMINE HCL 50 MG/ML IJ SOLN: 25.0000 mg | Freq: Four times a day (QID) | INTRAMUSCULAR | Status: DC | PRN

## 2017-04-20 MED ORDER — SODIUM CHLORIDE 0.9 % IV SOLN
Freq: Once | INTRAVENOUS | Status: AC
  Administered 2017-04-20: 12:00:00 via INTRAVENOUS

## 2017-04-20 NOTE — Progress Notes (Signed)
Nutrition Follow-up  DOCUMENTATION CODES:   Not applicable  INTERVENTION:  - Continue Ensure Enlive BID, Magic Cup TID, Juven BID. - Continue to encourage PO intakes.  - Will continue to follow POC/GOC.   NUTRITION DIAGNOSIS:   Increased nutrient needs related to wound healing as evidenced by estimated needs. -ongoing  GOAL:   Patient will meet greater than or equal to 90% of their needs -unmet  MONITOR:   PO intake, Supplement acceptance, Weight trends, Labs, Skin, Other (Comment)(GOC/POC)  ASSESSMENT:   84 y.o. female with a history of CVA/ICH s/p TPA with residual deficits, HTN, T2DM and sacral decubitus ulcer requiring debridement followed by hydrotherapy recently discharged 03/30/2017 to SNF who returned from SNF for fever and AMS, having decreased per oral intake.   Pt has been eating 5-25% of meals, on average, since previous RD assessment on 04/14/17. She has mainly (>/= 75% of the time) been accepting oral nutrition supplements. Weight on 04/16/17 was -3 lbs/1.3 kg compared to weight on admission; no new weight since 1/17.   Per Dr. Keturah Barre note, pt with severe sepsis d/t osteomyelitis of the sacrum now on antibiotics and followed by ID with plan for PICC placement. Notes state "long-term prognosis extremely poor d/t pt's advanced age, malnutrition, and bedbound status; explained to family in detail however at this time they want to pursue aggressive medical care and want to keep her full code." Pt with anasarca. Plan for SNF once PICC placed and improvement in renal function.   Medications reviewed; 20 mg IV Lasix x1 today, daily multivitamin with minerals.  Labs reviewed; Cl: 115 mmol/L, creatinine: 1.45 mg/dL, Ca: 8.2 mg/dL, GFR: 32 mL/min.   IVF: 1/2 NS-10 mEq KCl @ 50 mL/hr.     Diet Order:  DIET DYS 2 Room service appropriate? Yes; Fluid consistency: Thin Fall precautions  EDUCATION NEEDS:   No education needs have been identified at this time  Skin:  Skin  Integrity Issues:: Stage IV, Stage II, Unstageable, DTI, Other (Comment) DTI: L heel Stage II: L buttocks Stage IV: sacrum Unstageable: (full thickness) bilateral feet Other: non-pressure injuries to L leg, R heel, R foot  Last BM:  04/17/17  Height:   Ht Readings from Last 1 Encounters:  04/10/17 5\' 6"  (1.676 m)    Weight:   Wt Readings from Last 1 Encounters:  04/16/17 170 lb 3.1 oz (77.2 kg)    Ideal Body Weight:  59.09 kg  BMI:  Body mass index is 27.47 kg/m.  Estimated Nutritional Needs:   Kcal:  2200-2355 (28-30 kcal/kg)  Protein:  118-133 grams (1.5-1.7 grams/kg)  Fluid:  >/= 2.2 L/day      Jarome Matin, MS, RD, LDN, [facility] Inpatient Clinical Dietitian Pager # [phone] After hours/weekend pager # [phone]

## 2017-04-20 NOTE — Progress Notes (Signed)
[facility]  PROGRESS NOTE  Julia Floyd YPP:[id number] DOB: Jun 16, 1930 DOA: 04/08/2017 PCP: Josetta Huddle, MD   Brief HPI:   84 y.o. female with a history of CVA/ICH s/p TPA with residual deficits, HTN, T2DM and sacral decubitus ulcer requiring debridement followed by hydrotherapy recently discharged 03/30/2017 to SNF who returned from SNF for fever and AMS, having decreased per oral intake. IV vancomycin and zosyn given empirically for osteomyelitis. Cultures drawn and 2u PRBCs provided. Continued to have recurrent fevers. ID consulted, felt fevers would continue despite adequate treatment. Not significantly improving, palliative care consulted for further goals of care discussions.    Subjective   Patient in bed, appears comfortable, denies any headache, no fever, no chest pain or pressure, no shortness of breath , no abdominal pain. No focal weakness.   Assessment/Plan:     1. Severe sepsis due to osteomyelitis of sacrum- CT abdomen pelvis without contrast showed left-sided sacrum concerning for osteomyelities. ID was consulted case discussed with Dr. Graylon Good on 04/18/2017, plan is to place a PICC line which IR on Monday, total 6 weeks of IV vancomycin and Zosyn with a 4-week follow-up with ID in the office, palliative care also involved, long-term prognosis extremely poor due to patient's advanced age, malnutrition and bedbound status, family explained in detail however at this time they want to pursue aggressive medical care and want to keep her full code.  Monday. 2. History of CVA/ICH with dysphagia-residual deficits have left patient bedbound/wheelchair-bound. Continue dysphagia 2 diet.  3. Anasarca/hypoalbuminemia-albumin is 1.8, patient given albumin 25 g Q6 hours x 4. 4. Unstageable  sacral pressure ulcer- concern for [place] to mellitus, status post debridement on 12/17 her recent admission. Patient undergoing hydrotherapy, general surgery following. No need for repeat  debridement. 5. Normocytic  anemia of chronic disease- ferritin 1477, hemoglobin 6.5 on admission. Up to 9.2 with two units P RBC.  Hemoglobin trended down after hydration for the last few days and on 04/20/2017 is around 7.6, it has this level for few days however with acute renal failure will go ahead and transfuse another unit on 04/20/2017 and monitor H&H.  No signs of active bleeding. 6. Thrombocytopenia-resolved likely due to linezolid given during previous admission. 7. Hypokalemia-resolved 8. Peripheral arterial disease-complicated by bilateral  pressure wounds. ABI 0.37 on right, 0.28 on left. Vascular surgery Dr. Bridgett Larsson was consulted during previous admission on 12/28, at that time he felt constant measures were appropriate. Wound care per wound nurse. 9. Hypertension-  Norvasc on hold due to hypotension 10. Hyperchloremic metabolic acidosis- will change IV fluids to 1/2 normal saline+ 10 meq kcl at 50 ml/hr. 11.  ARF.  Likely due to anemia transfuse, has Foley catheter hence no obstruction, will check urine electrolytes and monitor BMP.  Check urine culture as well.   DVT prophylaxis: SCD, chemical DVT prophylaxis on hold due to recent ICH status post TPA six months ago  Code Status: Full code  Family Communication: Family updated bedside on 04/18/2017 by me.    Disposition Plan: Likely SNF once renal failure is better and PICC line is placed  Consultants:  Palliative care  Procedures:  None   Continuous infusions . sodium chloride    . piperacillin-tazobactam (ZOSYN)  IV Stopped (04/20/17 0904)  . sodium chloride 0.45 % 1,000 mL with potassium chloride 10 mEq infusion 50 mL/hr at 04/20/17 0429  . vancomycin 1,250 mg (04/20/17 0916)    Antibiotics:   Anti-infectives (From admission, onward)   Start  Dose/Rate Route Frequency Ordered Stop   04/20/17 1000  vancomycin (VANCOCIN) 1,250 mg in sodium chloride 0.9 % 250 mL IVPB     1,250 mg 166.7 mL/hr over 90 Minutes  Intravenous Every 48 hours 04/20/17 0751     04/20/17 0800  vancomycin (VANCOCIN) 1,250 mg in sodium chloride 0.9 % 250 mL IVPB  Status:  Discontinued     1,250 mg 166.7 mL/hr over 90 Minutes Intravenous Every 48 hours 04/19/17 1248 04/20/17 0743   04/18/17 1200  piperacillin-tazobactam (ZOSYN) IVPB 3.375 g     3.375 g 12.5 mL/hr over 240 Minutes Intravenous Every 8 hours 04/18/17 0957     04/18/17 0800  vancomycin (VANCOCIN) IVPB 1000 mg/200 mL premix  Status:  Discontinued     1,000 mg 200 mL/hr over 60 Minutes Intravenous Every 36 hours 04/16/17 1016 04/19/17 1248   04/09/17 0800  vancomycin (VANCOCIN) IVPB 1000 mg/200 mL premix  Status:  Discontinued     1,000 mg 200 mL/hr over 60 Minutes Intravenous Every 24 hours 04/08/17 2128 04/16/17 1016   04/09/17 0400  piperacillin-tazobactam (ZOSYN) IVPB 3.375 g  Status:  Discontinued     3.375 g 12.5 mL/hr over 240 Minutes Intravenous Every 8 hours 04/08/17 2128 04/16/17 2103   04/08/17 2115  vancomycin (VANCOCIN) IVPB 1000 mg/200 mL premix     1,000 mg 200 mL/hr over 60 Minutes Intravenous  Once 04/08/17 2104 04/08/17 2242   04/08/17 2115  piperacillin-tazobactam (ZOSYN) IVPB 3.375 g     3.375 g 100 mL/hr over 30 Minutes Intravenous  Once 04/08/17 2104 04/08/17 2156       Objective   Vitals:   04/19/17 0618 04/19/17 1357 04/19/17 2033 04/20/17 0439  BP: (!) 126/45 (!) 143/49 (!) 120/41 (!) 125/54  Pulse: 93 99 97 90  Resp: 16 18 19 20   Temp: 99 F (37.2 C) 98.7 F (37.1 C) 98.4 F (36.9 C) 98.7 F (37.1 C)  TempSrc: Oral Oral Oral Oral  SpO2: 100% 100% 100% 100%  Weight:      Height:        Intake/Output Summary (Last 24 hours) at 04/20/2017 1019 Last data filed at 04/20/2017 0916 Gross per 24 hour  Intake 1320 ml  Output 195 ml  Net 1125 ml   Filed Weights   04/08/17 1821 04/16/17 0342  Weight: 78.5 kg (173 lb) 77.2 kg (170 lb 3.1 oz)     Physical Examination:  In bed, pleasantly confused but in no distress, no  new focal deficit [place].AT,PERRAL Supple Neck,No JVD, No cervical lymphadenopathy appriciated.  Symmetrical Chest wall movement, Good air movement bilaterally, CTAB RRR,No Gallops, Rubs or new Murmurs, No Parasternal Heave +ve B.Sounds, Abd Soft, No tenderness, No organomegaly appriciated, No rebound - guarding or rigidity.  Indwelling Foley catheter in place, light yellow urine in the bag No Cyanosis, Clubbing or edema, No new Rash or bruise, kindly evaluate wound care note for sacral decubitus ulcer    Data Reviewed: I have personally reviewed following labs and imaging studies  CBG: No results for input(s): GLUCAP in the last 168 hours.  CBC: Recent Labs  Lab 04/16/17 0727 04/17/17 0517 04/18/17 0628 04/20/17 0535  WBC 8.0 8.4 9.1 9.7  HGB 7.0* 7.7* 8.0* 7.6*  HCT 21.6* 23.4* 24.6* 23.0*  MCV 82.8 81.8 82.0 81.3  PLT 157 151 125* 102*    Basic Metabolic Panel: Recent Labs  Lab 04/14/17 0314 04/15/17 0301 04/16/17 0727 04/18/17 0628 04/20/17 0535  NA 135 137 140 137  136  K 4.3 4.1 3.7 3.6 3.8  CL 111 115* 116* 115* 115*  CO2 20* 18* 17* 17* 16*  GLUCOSE 86 86 63* 99 80  BUN 14 12 11 16  21*  CREATININE 0.82 0.87 0.92 0.80 1.45*  CALCIUM 7.5* 7.7* 8.3* 8.2* 8.2*    No results found for this or any previous visit (from the past 240 hour(s)).   Liver Function Tests: No results for input(s): AST, ALT, ALKPHOS, BILITOT, PROT, ALBUMIN in the last 168 hours. No results for input(s): LIPASE, AMYLASE in the last 168 hours. No results for input(s): AMMONIA in the last 168 hours.  Cardiac Enzymes: No results for input(s): CKTOTAL, CKMB, CKMBINDEX, TROPONINI in the last 168 hours. BNP (last 3 results) No results for input(s): BNP in the last 8760 hours.  ProBNP (last 3 results) No results for input(s): PROBNP in the last 8760 hours.    Studies: Dg Chest Port 1 View  Result Date: 04/20/2017 CLINICAL DATA:  Shortness of breath, previous CVA, diabetes, dementia.  EXAM: PORTABLE CHEST 1 VIEW COMPARISON:  PA and lateral chest x-ray of April 08, 2017. FINDINGS: The lungs are well-expanded. There is a small right pleural effusion and Julia left pleural effusion. There is no alveolar infiltrate. The heart and pulmonary vascularity are normal. The mediastinum is normal in width. IMPRESSION: Small right and Julia left pleural effusion. Otherwise no active cardiopulmonary disease. Electronically Signed   By: David  Martinique M.D.   On: 04/20/2017 07:36    Scheduled Meds: . collagenase   Topical BID  . feeding supplement (ENSURE ENLIVE)  237 mL Oral BID BM  . multivitamin with minerals  1 tablet Oral Daily  . nutrition supplement (JUVEN)  1 packet Oral BID BM      Time spent: 20 min  Signature  Lala Lund M.D on 04/20/2017 at 10:19 AM  Between 7am to 7pm - Pager - [phone] ( page via [email or web address], text pages only, please mention full 10 digit call back number).  After 7pm go to [email or web address] - password [facility]

## 2017-04-20 NOTE — Progress Notes (Signed)
[facility] Progress Note     Subjective: CC: sacral decubitus ulcer Awake. Answers questions.  VSS.   Objective: Vital signs in last 24 hours: Temp:  [97.8 F (36.6 C)-98.7 F (37.1 C)] 97.8 F (36.6 C) (01/21 1225) Pulse Rate:  [90-99] 91 (01/21 1225) Resp:  [14-20] 14 (01/21 1225) BP: (120-143)/(41-54) 132/52 (01/21 1225) SpO2:  [100 %] 100 % (01/21 1225) Last BM Date: 04/17/17  Intake/Output from previous day: 01/20 0701 - 01/21 0700 In: 1450 [P.O.:100; I.V.:1200; IV Piggyback:150] Out: 175 [Urine:175] Intake/Output this shift: Total I/O In: 370 [P.O.:120; I.V.:250] Out: 20 [Urine:20]  PE: Gen:  Alert, NAD, pleasant Pulm:  Normal effort,  Abd: Soft, non-tender, non-distended Sacral wound:    Lab Results:  Recent Labs    04/18/17 0628 04/20/17 0535  WBC 9.1 9.7  HGB 8.0* 7.6*  HCT 24.6* 23.0*  PLT 125* 102*   BMET Recent Labs    04/18/17 0628 04/20/17 0535  NA 137 136  K 3.6 3.8  CL 115* 115*  CO2 17* 16*  GLUCOSE 99 80  BUN 16 21*  CREATININE 0.80 1.45*  CALCIUM 8.2* 8.2*   PT/INR No results for input(s): LABPROT, INR in the last 72 hours. CMP     Component Value Date/Time   NA 136 04/20/2017 0535   K 3.8 04/20/2017 0535   CL 115 (H) 04/20/2017 0535   CO2 16 (L) 04/20/2017 0535   GLUCOSE 80 04/20/2017 0535   BUN 21 (H) 04/20/2017 0535   CREATININE 1.45 (H) 04/20/2017 0535   CALCIUM 8.2 (L) 04/20/2017 0535   PROT 6.0 (L) 04/08/2017 1900   ALBUMIN 1.8 (L) 04/08/2017 1900   AST 45 (H) 04/08/2017 1900   ALT 12 (L) 04/08/2017 1900   ALKPHOS 67 04/08/2017 1900   BILITOT 0.6 04/08/2017 1900   GFRNONAA 32 (L) 04/20/2017 0535   GFRAA 37 (L) 04/20/2017 0535   Lipase  No results found for: LIPASE     Studies/Results: Dg Chest Port 1 View  Result Date: 04/20/2017 CLINICAL DATA:  Shortness of breath, previous CVA, diabetes, dementia. EXAM: PORTABLE CHEST 1 VIEW COMPARISON:  PA and lateral chest x-ray of April 08, 2017.  FINDINGS: The lungs are well-expanded. There is a small right pleural effusion and trace left pleural effusion. There is no alveolar infiltrate. The heart and pulmonary vascularity are normal. The mediastinum is normal in width. IMPRESSION: Small right and trace left pleural effusion. Otherwise no active cardiopulmonary disease. Electronically Signed   By: David  Martinique M.D.   On: 04/20/2017 07:36    Anti-infectives: Anti-infectives (From admission, onward)   Start     Dose/Rate Route Frequency Ordered Stop   04/20/17 1000  vancomycin (VANCOCIN) 1,250 mg in sodium chloride 0.9 % 250 mL IVPB     1,250 mg 166.7 mL/hr over 90 Minutes Intravenous Every 48 hours 04/20/17 0751     04/20/17 0800  vancomycin (VANCOCIN) 1,250 mg in sodium chloride 0.9 % 250 mL IVPB  Status:  Discontinued     1,250 mg 166.7 mL/hr over 90 Minutes Intravenous Every 48 hours 04/19/17 1248 04/20/17 0743   04/18/17 1200  piperacillin-tazobactam (ZOSYN) IVPB 3.375 g     3.375 g 12.5 mL/hr over 240 Minutes Intravenous Every 8 hours 04/18/17 0957     04/18/17 0800  vancomycin (VANCOCIN) IVPB 1000 mg/200 mL premix  Status:  Discontinued     1,000 mg 200 mL/hr over 60 Minutes Intravenous Every 36 hours 04/16/17 1016 04/19/17 1248  04/09/17 0800  vancomycin (VANCOCIN) IVPB 1000 mg/200 mL premix  Status:  Discontinued     1,000 mg 200 mL/hr over 60 Minutes Intravenous Every 24 hours 04/08/17 2128 04/16/17 1016   04/09/17 0400  piperacillin-tazobactam (ZOSYN) IVPB 3.375 g  Status:  Discontinued     3.375 g 12.5 mL/hr over 240 Minutes Intravenous Every 8 hours 04/08/17 2128 04/16/17 2103   04/08/17 2115  vancomycin (VANCOCIN) IVPB 1000 mg/200 mL premix     1,000 mg 200 mL/hr over 60 Minutes Intravenous  Once 04/08/17 2104 04/08/17 2242   04/08/17 2115  piperacillin-tazobactam (ZOSYN) IVPB 3.375 g     3.375 g 100 mL/hr over 30 Minutes Intravenous  Once 04/08/17 2104 04/08/17 2156       Assessment/Plan Sacral decubitus  Ulcer w/ OM - wound is not improving much, but no role for further surgical debridement at this time  - continuing to lose good tissue secondary to ongoing pressure - on BS abx for OM  - palliative care on board - continue hydrotherapy 3x/wk - overall prognosis poor     LOS: 12 days    Brigid Re , [facility] 04/20/2017, 12:31 PM Pager: [phone] Mon-Fri 7:00 am-4:30 pm Sat-Sun 7:00 am-11:30 am

## 2017-04-20 NOTE — Progress Notes (Signed)
   04/20/17 1158 Hydrotherapy treatment note [id number]  Subjective Assessment  Subjective awake at this time, states a few words  Patient and Family Stated Goals none stated  Date of Onset (unknown-POA 03/14/17)  Prior Treatments hydrotherapy initiated 03/17/17  Evaluation and Treatment  Evaluation and Treatment Procedures Explained to Patient/Family Yes  Evaluation and Treatment Procedures Patient unable to consent due to mental status  Pressure Injury 03/17/17 Stage IV - Full thickness tissue loss with exposed bone, tendon or muscle. *PT*  S/P I&D 03/16/17  Date First Assessed/Time First Assessed: 03/17/17 1340   Location: Sacrum  Staging: Stage IV - Full thickness tissue loss with exposed bone, tendon or muscle.  Wound Description (Comments): *PT*  S/P I&D 03/16/17  Present on Admission: Yes  Dressing Type ABD;Moist to dry (Santyl)  Dressing Changed  Dressing Change Frequency PRN  State of Healing Non-healing  Site / Wound Assessment Pink;Yellow;Brown  % Wound base Red or Granulating 50%  % Wound base Yellow/Fibrinous Exudate 40%  % Wound base Other/Granulation Tissue (Comment) 10% (bone and chips)  Peri-wound Assessment Erythema (blanchable);Excoriated  Wound Length (cm) 29 cm  Wound Width (cm) 14 cm  Wound Depth (cm) 5 cm  Wound Surface Area (cm^2) 406 cm^2  Wound Volume (cm^3) 2030 cm^3  Undermining (cm) 5 (from 10:00 to 3:00)  Margins Unattached edges (unapproximated)  Drainage Amount Moderate  Drainage Description Serosanguineous  Treatment Debridement (Selective);Hydrotherapy (Pulse lavage);Packing (Saline gauze) (santyl)  Hydrotherapy  Pulsed Lavage with Suction (psi) 8 psi  Pulsed Lavage with Suction - Normal Saline Used 1000 mL  Pulsed Lavage Tip Tip with splash shield  Pulsed lavage therapy - wound location sacral  Selective Debridement  Selective Debridement - Location sacrum  Selective Debridement - Tools Used Forceps;Scissors  Selective Debridement - Tissue  Removed brown and yellow tissue  Wound Therapy - Assess/Plan/Recommendations  Wound Therapy - Clinical Statement The patient is awake, speech is difficult to understand. Noted a bolus of grits in the mouth which was removed. The wound has essentially not improved. In fact, there is a necrotic flap of tissue on the edges from 5:00 to 2:00 that has dark and thick slough attatched beneath the area. CCS PA in to inspect wound. Currently, Hydrotherapy 3 x week. The patient can benefit from Air mattress. RN aware.  Wound Therapy - Functional Problem List Limited mobility due to hx of CVA and recent fall in 09/2016 (per pt). Long periods of time spent sitting in wheelchair without pressure redistribution  Factors Delaying/Impairing Wound Healing Diabetes Mellitus;Incontinence;Immobility;Infection - systemic/local  Hydrotherapy Plan Debridement;Dressing change;Patient/family education;Pulsatile lavage with suction  Wound Therapy - Frequency 3X / week  Wound Therapy - Current Recommendations Case manager/social work  Wound Therapy - Follow Up Recommendations Skilled nursing facility  Wound Plan hydrotherapy to sacral wound. Wound appears worse on today compared to evaluation. Also noted periwound skin issues-.with excoriation.   Wound Therapy Goals - Improve the function of patient's integumentary system by progressing the wound(s) through the phases of wound healing by:  Decrease Necrotic Tissue to 20  Decrease Necrotic Tissue - Progress Not progressing  Increase Granulation Tissue to 80  Increase Granulation Tissue - Progress not progressing  Goals/treatment plan/discharge plan were made with and agreed upon by patient/family No, Patient unable to participate in goals/treatment/discharge plan and family unavailable  Time For Goal Achievement 2 weeks  Wound Therapy - Potential for Goals Poor  Tresa Endo PT [phone]

## 2017-04-20 NOTE — Progress Notes (Signed)
Pharmacy Antibiotic Note  BLAYNE GARLICK is a 84 y.o. female admitted on 04/08/2017 with sepsis, recurrent fevers, sacral osteomyelitis.  Pharmacy has been consulted for vancomycin and zosyn dosing.  04/20/2017 day 12/42 abx Scr increased from 0.8>>1.45. WBC WNL, AF  Plan: Vanc 1250 q48 for AUC 539.5 (calc Css max 40, calc Css min 11.5 using IBW Cr and ABW Vd, SCr 1.45)  Continue Zosyn 3.375g IV q8h (4 hour infusion time).   Daily SCr. Height: 5\' 6"  (167.6 cm) Weight: 170 lb 3.1 oz (77.2 kg) IBW/kg (Calculated) : 59.3  Temp (24hrs), Avg:98.6 F (37 C), Min:98.4 F (36.9 C), Max:98.7 F (37.1 C)  Recent Labs  Lab 04/14/17 0314 04/15/17 0301 04/15/17 1138 04/16/17 0727 04/17/17 0517 04/18/17 0628 04/20/17 0535  WBC  --   --   --  8.0 8.4 9.1 9.7  CREATININE 0.82 0.87  --  0.92  --  0.80 1.Vincent  --   --   --  24*  --   --   --   VANCOPEAK  --   --  40  --   --   --   --     Estimated Creatinine Clearance: 30.3 mL/min (A) (by C-G formula based on SCr of 1.45 mg/dL (H)).    No Known Allergies  Antimicrobials this admission: 1/9 Vanc >>  1/9 Zosyn >>   Dose adjustments this admission: 1/17 Vancomycin 1gm q24 to q36 hr 1/20: Vanc 1g q36 to 1250mg  q48  Microbiology results: 1/9BCx: NGF 1/11 UCx: NGF 1/11 MRSA PCR: neg   Thank you for allowing pharmacy to be a part of this patient's care.  Eudelia Bunch, Pharm.D. [phone] 04/20/2017 7:57 AM

## 2017-04-20 NOTE — Care Management Note (Signed)
Case Management Note  Patient Details  Name: Julia Floyd MRN: [id number] Date of Birth: [birth year]  Subjective/Objective:From SNF-iv abx, picc,osteomyelitis sacrum. CSW already following.                    Action/Plan:d/c SNF.   Expected Discharge Date:  (unknown)               Expected Discharge Plan:  Skilled Nursing Facility  In-House Referral:  Clinical Social Work  Discharge planning Services  CM Consult  Post Acute Care Choice:    Choice offered to:     DME Arranged:    DME Agency:     HH Arranged:    [place] Agency:     Status of Service:  In process, will continue to follow  If discussed at Long Length of Stay Meetings, dates discussed:    Additional Comments:  Dessa Phi, RN 04/20/2017, 11:44 AM

## 2017-04-21 ENCOUNTER — Inpatient Hospital Stay (HOSPITAL_COMMUNITY): Payer: Medicare Other

## 2017-04-21 ENCOUNTER — Encounter (HOSPITAL_COMMUNITY): Payer: Self-pay

## 2017-04-21 LAB — TYPE AND SCREEN
ABO/RH(D): B POS
ANTIBODY SCREEN: NEGATIVE
UNIT DIVISION: 0

## 2017-04-21 LAB — CBC
HCT: 27.3 % — ABNORMAL LOW (ref 36.0–46.0)
Hemoglobin: 9 g/dL — ABNORMAL LOW (ref 12.0–15.0)
MCH: 26.7 pg (ref 26.0–34.0)
MCHC: 33 g/dL (ref 30.0–36.0)
MCV: 81 fL (ref 78.0–100.0)
Platelets: 85 10*3/uL — ABNORMAL LOW (ref 150–400)
RBC: 3.37 MIL/uL — AB (ref 3.87–5.11)
RDW: 18.8 % — AB (ref 11.5–15.5)
WBC: 10.5 10*3/uL (ref 4.0–10.5)

## 2017-04-21 LAB — BASIC METABOLIC PANEL
ANION GAP: 3 — AB (ref 5–15)
BUN: 23 mg/dL — ABNORMAL HIGH (ref 6–20)
CHLORIDE: 116 mmol/L — AB (ref 101–111)
CO2: 18 mmol/L — AB (ref 22–32)
CREATININE: 1.6 mg/dL — AB (ref 0.44–1.00)
Calcium: 8.3 mg/dL — ABNORMAL LOW (ref 8.9–10.3)
GFR calc Af Amer: 33 mL/min — ABNORMAL LOW (ref >60)
GFR calc non Af Amer: 28 mL/min — ABNORMAL LOW (ref >60)
Glucose, Bld: 88 mg/dL (ref 65–99)
Potassium: 4 mmol/L (ref 3.5–5.1)
Sodium: 137 mmol/L (ref 135–145)

## 2017-04-21 LAB — BPAM RBC
Blood Product Expiration Date: 201902192359
ISSUE DATE / TIME: 201901211153
UNIT TYPE AND RH: 7300

## 2017-04-21 LAB — CREATININE, URINE, RANDOM: CREATININE, URINE: 36.75 mg/dL

## 2017-04-21 LAB — SODIUM, URINE, RANDOM: Sodium, Ur: 99 mmol/L

## 2017-04-21 MED ORDER — FUROSEMIDE 10 MG/ML IJ SOLN
40.0000 mg | Freq: Two times a day (BID) | INTRAMUSCULAR | Status: AC
  Administered 2017-04-21 – 2017-04-22 (×2): 40 mg via INTRAVENOUS
  Filled 2017-04-21 (×2): qty 4

## 2017-04-21 MED ORDER — LIDOCAINE HCL 1 % IJ SOLN
INTRAMUSCULAR | Status: AC | PRN
  Administered 2017-04-21: 5 mL

## 2017-04-21 MED ORDER — LIDOCAINE HCL 1 % IJ SOLN
INTRAMUSCULAR | Status: AC
  Filled 2017-04-21: qty 20

## 2017-04-21 MED ORDER — SODIUM CHLORIDE 0.9% FLUSH: 10.0000 mL | INTRAVENOUS | Status: DC | PRN

## 2017-04-21 NOTE — Progress Notes (Signed)
Dressing changed done.Old kerlex gauze removed.moderate amount of serosanguinous drainage noted.No drainage noted pooling in wound.No foul odor noted Center of wound base has a single small skin tag that is black in color.Wound base is about 1 inch deep and 20 cm wide with tunneling about 2.5 cm at 10 o'clock,12 o'clock and 5 o'clock some red granulation tissue noted at sacral area.Wound was irrigated with NS and packed with saline impregnated kerlex gauze.There is a small wound about an inch deep that also has sanguinous drainage noted that was irrigated  And packed with 2 2x2 gauze that was also impregnated with NS.Santyl ointment was applied to entire wound surface prior to packing.ABD pads applied over site.Patient tolerated well.Marland Kitchen

## 2017-04-21 NOTE — Care Management Note (Signed)
Case Management Note  Patient Details  Name: JAZMYNE BEAUCHESNE MRN: [id number] Date of Birth: Jun 26, 1930  Subjective/Objective: Noted-awaiting renal function improvement prior d/c to SNF. From SNF-[facility] can manage iv abx,hydrotherapy-CSW already following.                   Action/Plan:d/c SNF.   Expected Discharge Date:  (unknown)               Expected Discharge Plan:  Skilled Nursing Facility  In-House Referral:  Clinical Social Work  Discharge planning Services  CM Consult  Post Acute Care Choice:    Choice offered to:     DME Arranged:    DME Agency:     HH Arranged:    [place] Agency:     Status of Service:  In process, will continue to follow  If discussed at Long Length of Stay Meetings, dates discussed:    Additional Comments:  Dessa Phi, RN 04/21/2017, 10:40 AM

## 2017-04-21 NOTE — Care Management Important Message (Signed)
Important Message  Patient Details  Name: Julia Floyd MRN: [id number] Date of Birth: [birth year]   Medicare Important Message Given:  Yes    Kerin Salen 04/21/2017, 11:30 AMImportant Message  Patient Details  Name: Julia Floyd MRN: [id number] Date of Birth: 11-09-1930   Medicare Important Message Given:  Yes    Kerin Salen 04/21/2017, 11:30 AM

## 2017-04-21 NOTE — Progress Notes (Signed)
[facility]  PROGRESS NOTE  CLETIS MUMA LPF:[id number] DOB: [birth year] DOA: 04/08/2017 PCP: Josetta Huddle, MD   Brief HPI:   84 y.o. female with a history of CVA/ICH s/p TPA with residual deficits, HTN, T2DM and sacral decubitus ulcer requiring debridement followed by hydrotherapy recently discharged 03/30/2017 to SNF who returned from SNF for fever and AMS, having decreased per oral intake. IV vancomycin and zosyn given empirically for osteomyelitis. Cultures drawn and 2u PRBCs provided. Continued to have recurrent fevers. ID consulted, felt fevers would continue despite adequate treatment. Not significantly improving, palliative care consulted for further goals of care discussions.    Subjective   Patient in bed, appears comfortable, denies any headache, no fever, no chest pain or pressure, no shortness of breath , no abdominal pain. No focal weakness.   Assessment/Plan:     1. Severe sepsis due to osteomyelitis of sacrum- CT abdomen pelvis without contrast showed left-sided sacrum concerning for osteomyelities. ID was consulted case discussed with Dr. Graylon Good on 04/18/2017, plan is to place a PICC line which IR on Monday, total 6 weeks of IV vancomycin and Zosyn with a 4-week follow-up with ID in the office, palliative care also involved, long-term prognosis extremely poor due to patient's advanced age, malnutrition and bedbound status, family explained in detail however at this time they want to pursue aggressive medical care and want to keep her full code.  Decline has been placed by IR on 04/21/2017, once renal function is better will be discharged to SNF. 2. History of CVA/ICH with dysphagia-residual deficits have left patient bedbound/wheelchair-bound. Continue dysphagia 2 diet.  3. Anasarca/hypoalbuminemia-albumin is 1.8, patient given albumin 25 g Q6 hours x 4. 4. Unstageable  sacral pressure ulcer- concern for [place] to mellitus, status post debridement on 12/17 her recent  admission. Patient undergoing hydrotherapy, general surgery following. No need for repeat debridement. 5. Normocytic  anemia of chronic disease- ferritin 1477, hemoglobin 6.5 on admission. Up to 9.2 with two units P RBC.  Hemoglobin trended down after hydration for the last few days and on 04/20/2017 is around 7.6, it has this level for few days however with acute renal failure we gave her another unit of packed RBC transfusion on 04/20/2017 with stable posttransfusion H&H, no signs of ongoing acute bleed. 6. Thrombocytopenia-resolved likely due to linezolid given during previous admission. 7. Hypokalemia-resolved continue to monitor 8. Peripheral arterial disease-complicated by bilateral  pressure wounds. ABI 0.37 on right, 0.28 on left. Vascular surgery Dr. Bridgett Larsson was consulted during previous admission on 12/28, at that time he felt constant measures were appropriate. Wound care per wound nurse. 9. Hypertension-  Norvasc on hold due to hypotension 10. Hyperchloremic metabolic acidosis-stop normal saline. 11.  ARF.  Was on oral Lasix and initial FeNa was less than 1 however this could have been influenced by Lasix, she received a liter of fluid along with a unit of blood transfusion with worsening renal function, this morning she has evidence of fluid overload, will challenge her with IV Lasix 2 doses and repeat BMP in the morning if stable can be discharged to SNF.   DVT prophylaxis: SCD, chemical DVT prophylaxis on hold due to recent ICH status post TPA six months ago  Code Status: Full code  Family Communication: Family updated bedside on 04/18/2017 by me.    Disposition Plan: Likely SNF once renal failure is better and PICC line is placed  Consultants:  Palliative care  Procedures:  PICC line placed by IR on 04/21/2017  Continuous  infusions . piperacillin-tazobactam (ZOSYN)  IV Stopped (04/21/17 0435)  . sodium chloride 0.45 % 1,000 mL with potassium chloride 10 mEq infusion 50 mL/hr at  04/20/17 0429  . vancomycin Stopped (04/20/17 1201)    Antibiotics:   Anti-infectives (From admission, onward)   Start     Dose/Rate Route Frequency Ordered Stop   04/20/17 1000  vancomycin (VANCOCIN) 1,250 mg in sodium chloride 0.9 % 250 mL IVPB     1,250 mg 166.7 mL/hr over 90 Minutes Intravenous Every 48 hours 04/20/17 0751     04/20/17 0800  vancomycin (VANCOCIN) 1,250 mg in sodium chloride 0.9 % 250 mL IVPB  Status:  Discontinued     1,250 mg 166.7 mL/hr over 90 Minutes Intravenous Every 48 hours 04/19/17 1248 04/20/17 0743   04/18/17 1200  piperacillin-tazobactam (ZOSYN) IVPB 3.375 g     3.375 g 12.5 mL/hr over 240 Minutes Intravenous Every 8 hours 04/18/17 0957     04/18/17 0800  vancomycin (VANCOCIN) IVPB 1000 mg/200 mL premix  Status:  Discontinued     1,000 mg 200 mL/hr over 60 Minutes Intravenous Every 36 hours 04/16/17 1016 04/19/17 1248   04/09/17 0800  vancomycin (VANCOCIN) IVPB 1000 mg/200 mL premix  Status:  Discontinued     1,000 mg 200 mL/hr over 60 Minutes Intravenous Every 24 hours 04/08/17 2128 04/16/17 1016   04/09/17 0400  piperacillin-tazobactam (ZOSYN) IVPB 3.375 g  Status:  Discontinued     3.375 g 12.5 mL/hr over 240 Minutes Intravenous Every 8 hours 04/08/17 2128 04/16/17 2103   04/08/17 2115  vancomycin (VANCOCIN) IVPB 1000 mg/200 mL premix     1,000 mg 200 mL/hr over 60 Minutes Intravenous  Once 04/08/17 2104 04/08/17 2242   04/08/17 2115  piperacillin-tazobactam (ZOSYN) IVPB 3.375 g     3.375 g 100 mL/hr over 30 Minutes Intravenous  Once 04/08/17 2104 04/08/17 2156       Objective   Vitals:   04/20/17 1225 04/20/17 1443 04/20/17 2250 04/21/17 0612  BP: (!) 132/52 (!) 140/50 (!) 143/47 (!) 127/45  Pulse: 91 89 83 79  Resp: 14 16 19 20   Temp: 97.8 F (36.6 C) 98 F (36.7 C) 98 F (36.7 C) 97.8 F (36.6 C)  TempSrc: Oral Oral Oral Oral  SpO2: 100% 100% 100% 100%  Weight:      Height:        Intake/Output Summary (Last 24 hours) at  04/21/2017 1020 Last data filed at 04/21/2017 9292 Gross per 24 hour  Intake 2080 ml  Output 500 ml  Net 1580 ml   Filed Weights   04/08/17 1821 04/16/17 0342  Weight: 78.5 kg (173 lb) 77.2 kg (170 lb 3.1 oz)     Physical Examination:  In bed, pleasantly confused but in no distress, no new focal deficit [place].AT,PERRAL Supple Neck,No JVD, No cervical lymphadenopathy appriciated.  Symmetrical Chest wall movement, Good air movement bilaterally, CTAB RRR,No Gallops, Rubs or new Murmurs, No Parasternal Heave +ve B.Sounds, Abd Soft, No tenderness, No organomegaly appriciated, No rebound - guarding or rigidity.  Indwelling Foley catheter in place, light yellow urine in the bag No Cyanosis, Clubbing or edema, No new Rash or bruise, she does have 1+ edema today,  kindly review wound care note for sacral decubitus ulcer    Data Reviewed: I have personally reviewed following labs and imaging studies  CBG: No results for input(s): GLUCAP in the last 168 hours.  CBC: Recent Labs  Lab 04/16/17 0727 04/17/17 0517 04/18/17 4462  04/20/17 0535 04/20/17 1732 04/21/17 0623  WBC 8.0 8.4 9.1 9.7  --  10.5  HGB 7.0* 7.7* 8.0* 7.6* 9.6* 9.0*  HCT 21.6* 23.4* 24.6* 23.0* 29.0* 27.3*  MCV 82.8 81.8 82.0 81.3  --  81.0  PLT 157 151 125* 102*  --  85*    Basic Metabolic Panel: Recent Labs  Lab 04/15/17 0301 04/16/17 0727 04/18/17 0628 04/20/17 0535 04/21/17 0623  NA 137 140 137 136 137  K 4.1 3.7 3.6 3.8 4.0  CL 115* 116* 115* 115* 116*  CO2 18* 17* 17* 16* 18*  GLUCOSE 86 63* 99 80 88  BUN 12 11 16  21* 23*  CREATININE 0.87 0.92 0.80 1.45* 1.60*  CALCIUM 7.7* 8.3* 8.2* 8.2* 8.3*    No results found for this or any previous visit (from the past 240 hour(s)).   Liver Function Tests: No results for input(s): AST, ALT, ALKPHOS, BILITOT, PROT, ALBUMIN in the last 168 hours. No results for input(s): LIPASE, AMYLASE in the last 168 hours. No results for input(s): AMMONIA in the last  168 hours.  Cardiac Enzymes: No results for input(s): CKTOTAL, CKMB, CKMBINDEX, TROPONINI in the last 168 hours. BNP (last 3 results) No results for input(s): BNP in the last 8760 hours.  ProBNP (last 3 results) No results for input(s): PROBNP in the last 8760 hours.    Studies: Dg Chest Port 1 View  Result Date: 04/20/2017 CLINICAL DATA:  Shortness of breath, previous CVA, diabetes, dementia. EXAM: PORTABLE CHEST 1 VIEW COMPARISON:  PA and lateral chest x-ray of April 08, 2017. FINDINGS: The lungs are well-expanded. There is a small right pleural effusion and trace left pleural effusion. There is no alveolar infiltrate. The heart and pulmonary vascularity are normal. The mediastinum is normal in width. IMPRESSION: Small right and trace left pleural effusion. Otherwise no active cardiopulmonary disease. Electronically Signed   By: David  Martinique M.D.   On: 04/20/2017 07:36   Ir Picc Placement Right >5 Yrs Inc Img Guide  Result Date: 04/21/2017 INDICATION: Poor venous access. In need of intravenous access for antibiotic administration. EXAM: ULTRASOUND AND FLUOROSCOPIC GUIDED PICC LINE INSERTION MEDICATIONS: None. CONTRAST:  None FLUOROSCOPY TIME:  54 seconds (6 mGy) COMPLICATIONS: None immediate. TECHNIQUE: The procedure, risks, benefits, and alternatives were explained to the patient's family and informed written consent was obtained. A timeout was performed prior to the initiation of the procedure. The right upper extremity was prepped with chlorhexidine in a sterile fashion, and a sterile drape was applied covering the operative field. Maximum barrier sterile technique with sterile gowns and gloves were used for the procedure. A timeout was performed prior to the initiation of the procedure. Local anesthesia was provided with 1% lidocaine. Under direct ultrasound guidance, the basilic vein was accessed with a micropuncture kit after the overlying soft tissues were anesthetized with 1%  lidocaine. After the overlying soft tissues were anesthetized, a small venotomy incision was created and a micropuncture kit was utilized to access the right basilic vein. Real-time ultrasound guidance was utilized for vascular access including the acquisition of a permanent ultrasound image documenting patency of the accessed vessel. A guidewire was advanced to the level of the superior caval-atrial junction for measurement purposes and the PICC line was cut to length. A peel-away sheath was placed and a 33 cm, 5 [place], dual lumen was inserted to level of the superior caval-atrial junction. A post procedure spot fluoroscopic was obtained. The catheter easily aspirated and flushed and was sutured in  place. A dressing was placed. The patient tolerated the procedure well without immediate post procedural complication. FINDINGS: After catheter placement, the tip lies within the superior cavoatrial junction. The catheter aspirates and flushes normally and is ready for immediate use. IMPRESSION: Successful ultrasound and fluoroscopic guided placement of a right basilic vein approach, 33 cm, 5 French, dual lumen PICC with tip at the superior caval-atrial junction. The PICC line is ready for immediate use. Electronically Signed   By: Sandi Mariscal M.D.   On: 04/21/2017 09:32    Scheduled Meds: . collagenase   Topical BID  . feeding supplement (ENSURE ENLIVE)  237 mL Oral BID BM  . furosemide  40 mg Intravenous Q12H  . multivitamin with minerals  1 tablet Oral Daily  . nutrition supplement (JUVEN)  1 packet Oral BID BM      Time spent: 20 min  Signature  Lala Lund M.D on 04/21/2017 at 10:20 AM  Between 7am to 7pm - Pager - [phone] ( page via [email or web address], text pages only, please mention full 10 digit call back number).  After 7pm go to [email or web address] - password [facility]

## 2017-04-21 NOTE — Procedures (Signed)
Pre procedural Diagnosis: Poor venous access Post Procedural Diagnosis: Same  Successful placement of right basilic vein approach 33 cm dual lumen PICC line with tip at the superior caval-atrial junction.    EBL: None  No immediate post procedural complication.  The PICC line is ready for immediate use.  Ronny Bacon, MD Pager #: [phone]

## 2017-04-22 DIAGNOSIS — R5081 Fever presenting with conditions classified elsewhere: Secondary | ICD-10-CM

## 2017-04-22 DIAGNOSIS — R1310 Dysphagia, unspecified: Secondary | ICD-10-CM

## 2017-04-22 LAB — BASIC METABOLIC PANEL
Anion gap: 4 — ABNORMAL LOW (ref 5–15)
BUN: 25 mg/dL — AB (ref 6–20)
CALCIUM: 8.4 mg/dL — AB (ref 8.9–10.3)
CO2: 17 mmol/L — ABNORMAL LOW (ref 22–32)
CREATININE: 1.59 mg/dL — AB (ref 0.44–1.00)
Chloride: 115 mmol/L — ABNORMAL HIGH (ref 101–111)
GFR calc Af Amer: 33 mL/min — ABNORMAL LOW (ref >60)
GFR, EST NON AFRICAN AMERICAN: 29 mL/min — AB (ref >60)
GLUCOSE: 104 mg/dL — AB (ref 65–99)
Potassium: 3.5 mmol/L (ref 3.5–5.1)
SODIUM: 136 mmol/L (ref 135–145)

## 2017-04-22 LAB — CBC
HCT: 26.7 % — ABNORMAL LOW (ref 36.0–46.0)
Hemoglobin: 9 g/dL — ABNORMAL LOW (ref 12.0–15.0)
MCH: 27.3 pg (ref 26.0–34.0)
MCHC: 33.7 g/dL (ref 30.0–36.0)
MCV: 80.9 fL (ref 78.0–100.0)
Platelets: 59 10*3/uL — ABNORMAL LOW (ref 150–400)
RBC: 3.3 MIL/uL — AB (ref 3.87–5.11)
RDW: 19.1 % — AB (ref 11.5–15.5)
WBC: 10.3 10*3/uL (ref 4.0–10.5)

## 2017-04-22 LAB — VANCOMYCIN, TROUGH: VANCOMYCIN TR: 25 ug/mL — AB (ref 15–20)

## 2017-04-22 MED ORDER — PIPERACILLIN-TAZOBACTAM 3.375 G IVPB: 3.3750 g | Freq: Four times a day (QID) | INTRAVENOUS | 0 refills | Status: AC

## 2017-04-22 MED ORDER — COLLAGENASE 250 UNIT/GM EX OINT: TOPICAL_OINTMENT | Freq: Two times a day (BID) | CUTANEOUS | 0 refills | Status: AC

## 2017-04-22 MED ORDER — HEPARIN SOD (PORK) LOCK FLUSH 100 UNIT/ML IV SOLN
250.0000 [IU] | INTRAVENOUS | Status: AC | PRN
  Administered 2017-04-22 (×2): 250 [IU]

## 2017-04-22 MED ORDER — VANCOMYCIN HCL 10 G IV SOLR: 1250.0000 mg | INTRAVENOUS | Status: DC

## 2017-04-22 NOTE — Progress Notes (Signed)
Report called to [facility], Therapist, sports at [facility]. [facility] here to transport patient.

## 2017-04-22 NOTE — Progress Notes (Signed)
Pharmacy Antibiotic Note  Julia Floyd is a 84 y.o. female admitted on 04/08/2017 with sepsis, recurrent fevers, sacral osteomyelitis.  Pharmacy has been consulted for vancomycin and zosyn dosing.  04/22/2017  Day #14/42 Scr increased from 0.8>>1.59. (stable last 24h) WBC WNL Vanco trough = 25 mcg/mL MD notes pltc trending down,  Suspect vanco >> zosyn but could be both.  TRH d/w ID and rec to stop vanco  Plan:  Based on elevated trough this am, Change vanco to 1250mg  IV q72h  Addendum: stop vanco for thrombocytopenia - suspected related to abx  Continue Zosyn 3.375g IV q8h (4 hour infusion time).   At discharge will have to be 3.375gm IV q6h over 40m infusion  Daily SCr. Height: 5\' 6"  (167.6 cm) Weight: 170 lb 3.1 oz (77.2 kg) IBW/kg (Calculated) : 59.3  Temp (24hrs), Avg:97.6 F (36.4 C), Min:97.4 F (36.3 C), Max:97.8 F (36.6 C)  Recent Labs  Lab 04/16/17 0727 04/17/17 0517 04/18/17 0628 04/20/17 0535 04/21/17 0623 04/22/17 0453 04/22/17 0927  WBC 8.0 8.4 9.1 9.7 10.5 10.3  --   CREATININE 0.92  --  0.80 1.45* 1.60* 1.59*  --   VANCOTROUGH 24*  --   --   --   --   --  25*    Estimated Creatinine Clearance: 27.7 mL/min (A) (by C-G formula based on SCr of 1.59 mg/dL (H)).    No Known Allergies  Antimicrobials this admission: 1/9 Vanc >>  1/9 Zosyn >>   Dose adjustments this admission: 1/17 Vancomycin 1gm q24 to q36 hr 1/20: Vanc 1g q36 to 1250mg  q48 1/23 0930 VT = 25 mcg/ml on 1250mg  q48h (prior to 2nd dose)  Microbiology results: 1/9BCx: NGF 1/11 UCx: NGF 1/11 MRSA PCR: neg  Thank you for allowing pharmacy to be a part of this patient's care.  Doreene Eland, PharmD, BCPS.   Pager: [phone] 04/22/2017 11:45 AM

## 2017-04-22 NOTE — NC FL2 (Signed)
[place] LEVEL OF CARE SCREENING TOOL     IDENTIFICATION  Patient Name: Julia Floyd Birthdate: [birth year] Sex: female Admission Date (Current Location): 04/08/2017  [facility] and [place] Number:  Herbalist and Address:  [facility],  [place] [address] [place]      Provider Number: [id number]  Attending Physician Name and Address:  Oswald Hillock, MD  Relative Name and Phone Number:       Current Level of Care: Hospital Recommended Level of Care: [place] [facility] Prior Approval Number:    Date Approved/Denied:   PASRR Number:   [id number]  Discharge Plan: SNF    Current Diagnoses: Patient Active Problem List   Diagnosis Date Noted  . Fever 04/12/2017  . Moderate protein-calorie malnutrition ([place]) 04/12/2017  . Sepsis associated hypotension ([place]) 04/08/2017  . Anemia of chronic disease 04/08/2017  . Encounter for palliative care   . Goals of care, counseling/discussion   . Peripheral arterial disease ([place]) 03/18/2017  . Sacral osteomyelitis ([place]) 03/17/2017  . Open wound of right heel 03/17/2017  . Aortic atherosclerosis ([place]) 03/17/2017  . Sacral decubitus ulcer 03/14/2017  . Normochromic normocytic anemia 03/14/2017  . Closed trimalleolar fracture, right, initial encounter 02/05/2017  . Closed dislocation of right ankle 01/14/2017  . Dysphagia 01/14/2017  . History of CVA (cerebrovascular accident) 01/14/2017  . Acute urinary retention 01/14/2017  . Alzheimer's dementia 01/14/2017  . Benign essential HTN   . Neuropathic pain   . Urinary incontinence   . Constipation   . Diabetes mellitus type 2 in nonobese (HCC)   . Acute blood loss anemia   . Gait disturbance, post-stroke   . Intraparenchymal hemorrhage of brain ([place]) 10/14/2016  . Received tissue plasminogen activator (t-PA) less than 24 hours prior to arrival 10/09/2016  . ICH (intracerebral hemorrhage) ([place]) 10/09/2016  . Cytotoxic brain edema  ([place]) 10/09/2016  . Stroke (cerebrum) ([place]) -  Suspect stroke/TIA s/p tPA with multifocal ICH - etiology unclear, concerning for underlying CAA 10/07/2016  . Diabetes ([place])   . Depression   . DDD (degenerative disc disease)   . Uterine fibroid   . Trigeminal neuralgia   . Sixth cranial nerve palsy     Orientation RESPIRATION BLADDER Height & Weight     Self, Time, Place, Situation    Indwelling catheter  Weight: 170 lb 3.1 oz (77.2 kg) Height:  5\' 6"  (167.6 cm)  BEHAVIORAL SYMPTOMS/MOOD NEUROLOGICAL BOWEL NUTRITION STATUS      Incontinent Diet(See Discharge Summary)  AMBULATORY STATUS COMMUNICATION OF NEEDS Skin   Total Care Verbally PU Stage and Appropriate Care, Hydro Therapy PU Stage 1 Dressing: BID(Location: Ischial tuberosity  Location Orientation: Left  Staging: Unstageable - Full thickness tissue loss in which the base of the ulcer is covered by slough (yellow, tan, gray, green or brown...)     PressureInjury01/11/1917cmx14cm  Location: Buttocks Location Orientation: Left;Posterior  ABD pads;Gauze changed 2x/day   PressureInjuryDeepTissueInjury  Location: Heel Location Orientation: Left;Posterior  Foam dressing changed 2x/day   PressureInjury12/18/18StageIV-Fullthicknesstissuelosswithexposedbone,tendonormuscle  Location: Sacrum ABD;Moist to dry santyl Changed PRN   PressureInjury01/11/19Unstageable-Fullthicknesstissuelossinwhichthebaseoftheulceriscoveredbyslough   Location: Foot Location Orientation: Left;Posterior  Foam Dressing PRN  Pressure Injury Unstageable Location: Foot Location Orientation: Posterior;Right Foam Dressing  Pressure Injury Stage II Location: Buttocks Location Orientation: Left ABD pads; gauze changed 2x/day  Pressure Injury Unstageable  Location: Ankle Location Orientation: Bilateral  Foam Dressing  Wound/Incision 4cm x 3cm Location: Leg Orientation: Left;Posterior;Upper   Wound/Incision  Non-pressure wound Heel  Right Unstageable - covered with eschar  Wound/Incision Non-pressure wound foot right; anterior unstageable covered with eschar  Incision (sacrum) ABD pads;gauze changed 2x/day                    Personal Care Assistance Level of Assistance  Total care           Functional Limitations Info  Sight, Hearing, Speech Sight Info: Adequate Hearing Info: Adequate Speech Info: Adequate    SPECIAL CARE FACTORS FREQUENCY  PT (By licensed PT), OT (By licensed OT)     PT - Wound Therapy Frequency 6x/week            Contractures      Additional Factors Info  Code Status, Allergies Code Status Info: Fullcode  Allergies Info: NKA           Current Medications (04/22/2017):  This is the current hospital active medication list Current Facility-Administered Medications  Medication Dose Route Frequency Provider Last Rate Last Dose  . acetaminophen (TYLENOL) tablet 650 mg  650 mg Oral Q6H PRN Etta Quill, DO   650 mg at 04/09/17 0981   Or  . acetaminophen (TYLENOL) suppository 650 mg  650 mg Rectal Q6H PRN Etta Quill, DO   650 mg at 04/15/17 1157  . collagenase (SANTYL) ointment   Topical BID Patrecia Pour, MD      . diphenhydrAMINE (BENADRYL) injection 25 mg  25 mg Intravenous Q6H PRN Thurnell Lose, MD      . feeding supplement (ENSURE ENLIVE) (ENSURE ENLIVE) liquid 237 mL  237 mL Oral BID BM Patrecia Pour, MD   237 mL at 04/21/17 1046  . multivitamin with minerals tablet 1 tablet  1 tablet Oral Daily Patrecia Pour, MD   1 tablet at 04/22/17 1047  . nutrition supplement (JUVEN) (JUVEN) powder packet 1 packet  1 packet Oral BID BM Patrecia Pour, MD   1 packet at 04/22/17 1048  . ondansetron (ZOFRAN) tablet 4 mg  4 mg Oral Q6H PRN Etta Quill, DO       Or  . ondansetron [facility]) injection 4 mg  4 mg Intravenous Q6H PRN Etta Quill, DO      . piperacillin-tazobactam (ZOSYN) IVPB 3.375 g  3.375 g Intravenous Q8H Adrian Saran, RPH  12.5 mL/hr at 04/22/17 1048 3.375 g at 04/22/17 1048  . [place]   Oral PRN Josetta Huddle, MD      . sodium chloride 0.45 % 1,000 mL with potassium chloride 10 mEq infusion   Intravenous Continuous Oswald Hillock, MD   Stopped at 04/22/17 1324  . sodium chloride flush (NS) 0.9 % injection 10-40 mL  10-40 mL Intracatheter PRN Thurnell Lose, MD         Discharge Medications: Please see discharge summary for a list of discharge medications.  Relevant Imaging Results:  Relevant Lab Results:   Additional Information SSN- [id number]  Burnis Medin, LCSW

## 2017-04-22 NOTE — Discharge Summary (Signed)
Physician Discharge Summary  Julia Floyd:[id number] DOB: Apr 04, 1930 DOA: 04/08/2017  PCP: Josetta Huddle, MD  Admit date: 04/08/2017 Discharge date: 04/22/2017  Time spent: 35 minutes  Recommendations for Outpatient Follow-up:  1. Check CBC, BMP in 3 days 2. Platelet count is 59,000 likely from vancomycin, which has been discontinued. Follow platelet counts. 3. Continue wound care for sacral decubitus ulcer   Discharge Diagnoses:  Principal Problem:   Fever Active Problems:   Gait disturbance, post-stroke   Dysphagia   Alzheimer's dementia   Sacral decubitus ulcer   Sacral osteomyelitis (HCC)   Sepsis associated hypotension (HCC)   Anemia of chronic disease   Moderate protein-calorie malnutrition ([place])   Discharge Condition: Stable  Diet recommendation: Heart healthy diet  Filed Weights   04/08/17 1821 04/16/17 0342  Weight: 78.5 kg (173 lb) 77.2 kg (170 lb 3.1 oz)    History of present illness:  84 y.o.female with a history of CVA/ICH s/p TPA with residual deficits, HTN, T2DM and sacral decubitus ulcer requiring debridement followed by hydrotherapy recently discharged 03/30/2017 to SNF who returned from SNF for fever and AMS, having decreased per oral intake.IVvancomycin and zosyn given empirically for osteomyelitis. Cultures drawn and 2u PRBCs provided.Continued to have recurrent fevers. ID consulted, felt fevers would continue despite adequate treatment.Not significantly improving, palliative care consulted for further goals of care discussions.    Hospital Course:  1. Severe sepsis due to osteomyelitis of sacrum- CT abdomen pelvis without contrast showed left-sided sacrum concerning for osteomyelities. ID was consulted case discussed with Dr. Graylon Good on 04/18/2017, plan is to place a PICC line which IR on Monday, total 6 weeks Zosyn with a 4-week follow-up with ID in the office, vancomycin was discontinued due to Thrombocytopenia.  palliative care also  involved, long-term prognosis extremely poor due to patient's advanced age, malnutrition and bedbound status, family explained in detail however at this time they want to pursue aggressive medical care and want to keep her full code.Picc  has been placed by IR on 04/21/2017. Stop date for Zosyn is 2/201/9.Continue wound care at SNF 2. History of CVA/ICH with dysphagia-residual deficits have left patient bedbound/wheelchair-bound. Continue dysphagia 2 diet.  3. Anasarca/hypoalbuminemia-albumin is 1.8, patient given albumin 25 g Q6 hours x 4. 4. Unstageable  sacral pressure ulcer- concern for [place] to mellitus, status post debridement on 12/17 her recent admission. Patient undergoing hydrotherapy, general surgery saw the patient . No need for repeat debridement. 5. Normocytic  anemia of chronic disease- ferritin 1477, hemoglobin 6.5 on admission. Up to 9.2 with two units P RBC.  Hemoglobin trended down after hydration for the last few days and on 04/20/2017 is around 7.6, it has this level for few days however with acute renal failure we gave her another unit of packed RBC transfusion on 04/20/2017 with stable posttransfusion H&H, no signs of ongoing acute bleed. 6. Thrombocytopenia- platelet count is 59,000. Likely from Vancomycin, discussed with Dr Graylon Good, will discontinue vancomycin and continue zosyn. Wound care at SNF. 7. Hypokalemia-resolved  8. Peripheral arterial disease-complicated by bilateral  pressure wounds. ABI 0.37 on right, 0.28 on left. Vascular surgery Dr. Bridgett Larsson was consulted during previous admission on 12/28, at that time he felt comfort measures were appropriate. Wound care per wound nurse. 9. Hypertension-  Norvasc  10. Hyperchloremic metabolic acidosis- resolved 11.  ARF- creatinine is stable at 1.59, continue with lasix 20 mg po daily. Check BMP in 3 days    Procedures: None    Consultations:  None  Discharge Exam: Vitals:   04/21/17 2136 04/22/17 0510  BP: (!) 119/49 (!)  117/42  Pulse: 87 84  Resp: 19 17  Temp: (!) 97.4 F (36.3 C) (!) 97.4 F (36.3 C)  SpO2: 100% 100%    General: Appears in no acute distress Cardiovascular: s1s2 RRR Respiratory: Clear bilaterally  Discharge Instructions   Discharge Instructions    Diet - low sodium heart healthy   Complete by:  As directed    Increase activity slowly   Complete by:  As directed      Allergies as of 04/22/2017   No Known Allergies     Medication List    TAKE these medications   acetaminophen 325 MG tablet Commonly known as:  TYLENOL Take 650 mg by mouth 2 (two) times daily.   collagenase ointment Commonly known as:  SANTYL Apply topically 2 (two) times daily.   cyanocobalamin 500 MCG tablet Take 1 tablet (500 mcg total) by mouth daily. Vitamin B12   cyclobenzaprine 5 MG tablet Commonly known as:  FLEXERIL Take 1 tablet (5 mg total) by mouth 3 (three) times daily as needed for muscle spasms.   DECUBI-VITE Caps Take 1 capsule by mouth daily.   FISH OIL PO Take 1 capsule daily by mouth.   furosemide 20 MG tablet Commonly known as:  LASIX Take 1 tablet (20 mg total) by mouth daily.   gabapentin 100 MG capsule Commonly known as:  NEURONTIN Take 1-2 capsules (100-200 mg total) by mouth at bedtime.   piperacillin-tazobactam 3.375 GM/50ML IVPB Commonly known as:  ZOSYN Inject 50 mLs (3.375 g total) into the vein every 6 (six) hours for 28 days.   VITAMIN D (CHOLECALCIFEROL) PO Take 1 capsule daily by mouth.      No Known Allergies    The results of significant diagnostics from this hospitalization (including imaging, microbiology, ancillary and laboratory) are listed below for reference.    Significant Diagnostic Studies: Dg Chest 2 View  Result Date: 04/08/2017 CLINICAL DATA:  Fever and lethargy. EXAM: CHEST  2 VIEW COMPARISON:  03/21/2017 FINDINGS: Cardiomediastinal silhouette is normal. Mediastinal contours appear intact. Calcific atherosclerotic disease and  tortuosity of the aorta. There is no evidence of focal airspace consolidation, pleural effusion or pneumothorax. Osseous structures are without acute abnormality. Soft tissues are grossly normal. IMPRESSION: No active cardiopulmonary disease. Electronically Signed   By: Fidela Salisbury M.D.   On: 04/08/2017 19:53   Dg Pelvis 1-2 Views  Result Date: 04/08/2017 CLINICAL DATA:  Pelvic pain.  Question osteomyelitis. EXAM: PELVIS - 1-2 VIEW COMPARISON:  03/13/2017 abdomen and pelvis CT. FINDINGS: Detection of osteomyelitis is not possible due to degree of calcified fibroids. Osteopenia, and bowel gas. There was a large ulcer over the left lower sacrum on previous CT. No evidence of fracture or dislocation. IMPRESSION: Nondiagnostic for sacral osteomyelitis due to overlapping calcified fibroids and bowel gas. No acute finding. Electronically Signed   By: Monte Fantasia M.D.   On: 04/08/2017 20:57   Ct Head Wo Contrast  Result Date: 04/08/2017 CLINICAL DATA:  Altered level consciousness. History of hypertension, dementia, stroke and LEFT-sided deficits. EXAM: CT HEAD WITHOUT CONTRAST TECHNIQUE: Contiguous axial images were obtained from the base of the skull through the vertex without intravenous contrast. COMPARISON:  MRI of the head January 11, 2017 and CT HEAD October 09, 2016. FINDINGS: BRAIN: No intraparenchymal hemorrhage, mass effect nor midline shift. The ventricles and sulci are normal for age. Patchy supratentorial white matter hypodensities, with multifocal subcortical  supra and infratentorial infarcts at site of prior hemorrhage. No acute large vascular territory infarcts. No abnormal extra-axial fluid collections. Basal cisterns are patent. VASCULAR: Moderate calcific atherosclerosis of the carotid siphons. SKULL: No skull fracture. No significant scalp soft tissue swelling. SINUSES/ORBITS: LEFT maxillary mucosal retention cyst. No paranasal sinus air-fluid levels. Mastoid air cells are well aerated.  Included ocular globes and orbital contents are non-suspicious. OTHER: None. IMPRESSION: 1. No acute intracranial process. 2. Moderate chronic small vessel ischemic disease. Multifocal small old infarcts corresponding to hemorrhage on prior CT. Electronically Signed   By: Elon Alas M.D.   On: 04/08/2017 19:50   Dg Chest Port 1 View  Result Date: 04/20/2017 CLINICAL DATA:  Shortness of breath, previous CVA, diabetes, dementia. EXAM: PORTABLE CHEST 1 VIEW COMPARISON:  PA and lateral chest x-ray of April 08, 2017. FINDINGS: The lungs are well-expanded. There is a small right pleural effusion and trace left pleural effusion. There is no alveolar infiltrate. The heart and pulmonary vascularity are normal. The mediastinum is normal in width. IMPRESSION: Small right and trace left pleural effusion. Otherwise no active cardiopulmonary disease. Electronically Signed   By: David  Martinique M.D.   On: 04/20/2017 07:36   Ir Picc Placement Right >5 Yrs Inc Img Guide  Result Date: 04/21/2017 INDICATION: Poor venous access. In need of intravenous access for antibiotic administration. EXAM: ULTRASOUND AND FLUOROSCOPIC GUIDED PICC LINE INSERTION MEDICATIONS: None. CONTRAST:  None FLUOROSCOPY TIME:  54 seconds (6 mGy) COMPLICATIONS: None immediate. TECHNIQUE: The procedure, risks, benefits, and alternatives were explained to the patient's family and informed written consent was obtained. A timeout was performed prior to the initiation of the procedure. The right upper extremity was prepped with chlorhexidine in a sterile fashion, and a sterile drape was applied covering the operative field. Maximum barrier sterile technique with sterile gowns and gloves were used for the procedure. A timeout was performed prior to the initiation of the procedure. Local anesthesia was provided with 1% lidocaine. Under direct ultrasound guidance, the basilic vein was accessed with a micropuncture kit after the overlying soft tissues were  anesthetized with 1% lidocaine. After the overlying soft tissues were anesthetized, a small venotomy incision was created and a micropuncture kit was utilized to access the right basilic vein. Real-time ultrasound guidance was utilized for vascular access including the acquisition of a permanent ultrasound image documenting patency of the accessed vessel. A guidewire was advanced to the level of the superior caval-atrial junction for measurement purposes and the PICC line was cut to length. A peel-away sheath was placed and a 33 cm, 5 [place], dual lumen was inserted to level of the superior caval-atrial junction. A post procedure spot fluoroscopic was obtained. The catheter easily aspirated and flushed and was sutured in place. A dressing was placed. The patient tolerated the procedure well without immediate post procedural complication. FINDINGS: After catheter placement, the tip lies within the superior cavoatrial junction. The catheter aspirates and flushes normally and is ready for immediate use. IMPRESSION: Successful ultrasound and fluoroscopic guided placement of a right basilic vein approach, 33 cm, 5 French, dual lumen PICC with tip at the superior caval-atrial junction. The PICC line is ready for immediate use. Electronically Signed   By: Sandi Mariscal M.D.   On: 04/21/2017 09:32    Microbiology: No results found for this or any previous visit (from the past 240 hour(s)).   Labs: Basic Metabolic Panel: Recent Labs  Lab 04/16/17 4403 04/18/17 4742 04/20/17 0535 04/21/17 5956 04/22/17 3875  NA 140 137 136 137 136  K 3.7 3.6 3.8 4.0 3.5  CL 116* 115* 115* 116* 115*  CO2 17* 17* 16* 18* 17*  GLUCOSE 63* 99 80 88 104*  BUN 11 16 21* 23* 25*  CREATININE 0.92 0.80 1.45* 1.60* 1.59*  CALCIUM 8.3* 8.2* 8.2* 8.3* 8.4*   Liver Function Tests: No results for input(s): AST, ALT, ALKPHOS, BILITOT, PROT, ALBUMIN in the last 168 hours. No results for input(s): LIPASE, AMYLASE in the last 168  hours. No results for input(s): AMMONIA in the last 168 hours. CBC: Recent Labs  Lab 04/17/17 0517 04/18/17 0628 04/20/17 0535 04/20/17 1732 04/21/17 0623 04/22/17 0453  WBC 8.4 9.1 9.7  --  10.5 10.3  HGB 7.7* 8.0* 7.6* 9.6* 9.0* 9.0*  HCT 23.4* 24.6* 23.0* 29.0* 27.3* 26.7*  MCV 81.8 82.0 81.3  --  81.0 80.9  PLT 151 125* 102*  --  85* 59*       Signed:  Oswald Hillock MD.  [facility] 04/22/2017, 11:42 AM

## 2017-04-22 NOTE — Progress Notes (Signed)
CRITICAL VALUE ALERT  Critical Value:  Vanc 25  Date & Time Notied:  04/22/2017 1018  Provider Notified: Rachel Bo, pharmacist   Orders Received/Actions taken: held AM dose of vanc and orders will be adjusted

## 2017-04-22 NOTE — Progress Notes (Signed)
   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 *PT*  S/P I&D 03/16/17  Date First Assessed/Time First Assessed: 03/17/17 1340   Location: Sacrum  Staging: Stage IV - Full thickness tissue loss with exposed bone, tendon or muscle.  Wound Description (Comments): *PT*  S/P I&D 03/16/17  Present on Admission: Yes  Dressing Type ABD;Moist to dry (Santyl)  Dressing Changed  Dressing Change Frequency PRN  State of Healing Non-healing  Site / Wound Assessment Pink;Yellow;Brown  % Wound base Red or Granulating 50%  % Wound base Yellow/Fibrinous Exudate 40%  % Wound base Other/Granulation Tissue (Comment) 10% (bone and chips)  Peri-wound Assessment Erythema (blanchable);Excoriated  Wound Length (cm) 20 cm  Wound Width (cm) 14 cm  Wound Depth (cm) 3 cm  Wound Surface Area (cm^2) 280 cm^2  Wound Volume (cm^3) 840 cm^3  Margins Unattached edges (unapproximated)  Drainage Amount Moderate  Drainage Description Serosanguineous  Treatment Hydrotherapy (Pulse lavage);Packing (Saline gauze)  Hydrotherapy  Pulsed Lavage with Suction (psi) 8 psi  Pulsed Lavage with Suction - Normal Saline Used 1000 mL  Pulsed Lavage Tip Tip with splash shield  Pulsed lavage therapy - wound location sacral  Selective Debridement  Selective Debridement - Location sacrum  Selective Debridement - Tools Used Forceps;Scissors  Selective Debridement - Tissue Removed brown and yellow tissue  Wound Therapy - Assess/Plan/Recommendations  Wound Therapy - Clinical Statement wound has worsened when  compared to 1 month ago inspite of hydrotherapy. Bone and chips in base and area of necrosis on the right border with thick, dark slough in undermined area. the patient moans when turned. Plans DC to SNF.   Wound Therapy - Functional Problem List Limited mobility due to hx of CVA and recent fall in 09/2016 (per pt). Long periods of time spent sitting in wheelchair without pressure redistribution  Factors Delaying/Impairing Wound Healing Diabetes Mellitus;Incontinence;Immobility;Infection - systemic/local  Hydrotherapy Plan Debridement;Dressing change;Patient/family education;Pulsatile lavage with suction  Wound Therapy - Frequency 3X / week  Wound Therapy - Current Recommendations Case manager/social work  Wound Therapy - Follow Up Recommendations Skilled nursing facility  Wound Plan hydrotherapy to sacral wound. Wound appears worse on today compared to evaluation. Also noted periwound skin issues-.  Wound Therapy Goals - Improve the function of patient's integumentary system by progressing the wound(s) through the phases of wound healing by:  Decrease Necrotic Tissue to 20  Decrease Necrotic Tissue - Progress Not progressing  Increase Granulation Tissue to 80  Increase Granulation Tissue - Progress Mot progressing  Goals/treatment plan/discharge plan were made with and agreed upon by patient/family No, Patient unable to participate in goals/treatment/discharge plan and family unavailable  Time For Goal Achievement 2 weeks  Wound Therapy - Potential for Goals Poor  Tresa Endo PT [phone]

## 2017-04-22 NOTE — Progress Notes (Signed)
MEDICATION-RELATED CONSULT NOTE   IR Procedure Consult - Anticoagulant/Antiplatelet PTA/Inpatient Med List Review by Pharmacist    Procedure: PICC line placement    Completed: 04/21/17 0917  Post-Procedural bleeding risk per IR MD assessment:  LOW  Antithrombotic medications on inpatient or PTA profile prior to procedure:    NONE Per attending MD, using SCDs rather than pharmacological prophylaxis due to recent [employer]; s/p tPA six months ago   Plan:     Continue SCDs NO anticoagulation at this time as per orders from attending MD.  Clayburn Pert, PharmD, BCPS Pager: [phone] 04/22/2017  6:25 AM

## 2017-04-22 NOTE — Care Management Note (Signed)
Case Management Note  Patient Details  Name: Julia Floyd MRN: [id number] Date of Birth: Jun 15, 1930  Subjective/Objective:                    Action/Plan:d/c SNF.   Expected Discharge Date:  04/22/17               Expected Discharge Plan:  Skilled Nursing Facility  In-House Referral:  Clinical Social Work  Discharge planning Services  CM Consult  Post Acute Care Choice:    Choice offered to:     DME Arranged:    DME Agency:     HH Arranged:    [place] Agency:     Status of Service:  Completed, signed off  If discussed at [employer], dates discussed:    Additional Comments:  Dessa Phi, RN 04/22/2017, 12:39 PM

## 2017-04-22 NOTE — Clinical Social Work Placement (Signed)
Patient received and accepted bed offer at [facility]. Facility aware of patient's discharge and confirmed bed offer. PTAR contacted, patient's family notified. Patient's RN can call report to [place], packet complete. CSW signing off, no other needs identified at this time.  CLINICAL SOCIAL WORK PLACEMENT  NOTE  Date:  04/22/2017  Patient Details  Name: Julia Floyd MRN: [id number] Date of Birth: June 02, 1930  Clinical Social Work is seeking post-discharge placement for this patient at the [place] level of care (*CSW will initial, date and re-position this form in  chart as items are completed):  Yes   Patient/family provided with [place] Work Department's list of facilities offering this level of care within the geographic area requested by the patient (or if unable, by the patient's family).  Yes   Patient/family informed of their freedom to choose among providers that offer the needed level of care, that participate in Medicare, Medicaid or managed care program needed by the patient, have an available bed and are willing to accept the patient.  Yes   Patient/family informed of [facility]'s ownership interest in [facility] and [facility], as well as of the fact that they are under no obligation to receive care at these facilities.  PASRR submitted to EDS on       PASRR number received on       Existing PASRR number confirmed on 04/15/17     FL2 transmitted to all facilities in geographic area requested by pt/family on 04/15/17     FL2 transmitted to all facilities within larger geographic area on       Patient informed that his/her managed care company has contracts with or will negotiate with certain facilities, including the following:  [employer]     Yes   Patient/family informed of bed offers received.  Patient chooses bed at [facility]     Physician recommends and patient chooses bed at        Patient to be transferred to [facility] on 04/22/17.  Patient to be transferred to facility by PTAR     Patient family notified on 04/22/17 of transfer.  Name of family member notified:  Alexis Frock     PHYSICIAN       Additional Comment:    _______________________________________________ Burnis Medin, LCSW 04/22/2017, 2:26 PM

## 2017-04-23 LAB — URINE CULTURE

## 2017-05-01 DEATH — deceased

## 2018-07-08 IMAGING — DX DG ANKLE PORT 2V*R*
2 series · 2 of 2 positions shown · non-contrast
Comparison: 01/14/2017 right ankle radiographs.

CLINICAL DATA: 85 y/o  F; post reduction of right ankle.

EXAM:
PORTABLE RIGHT ANKLE - 2 VIEW

[ankle ap]
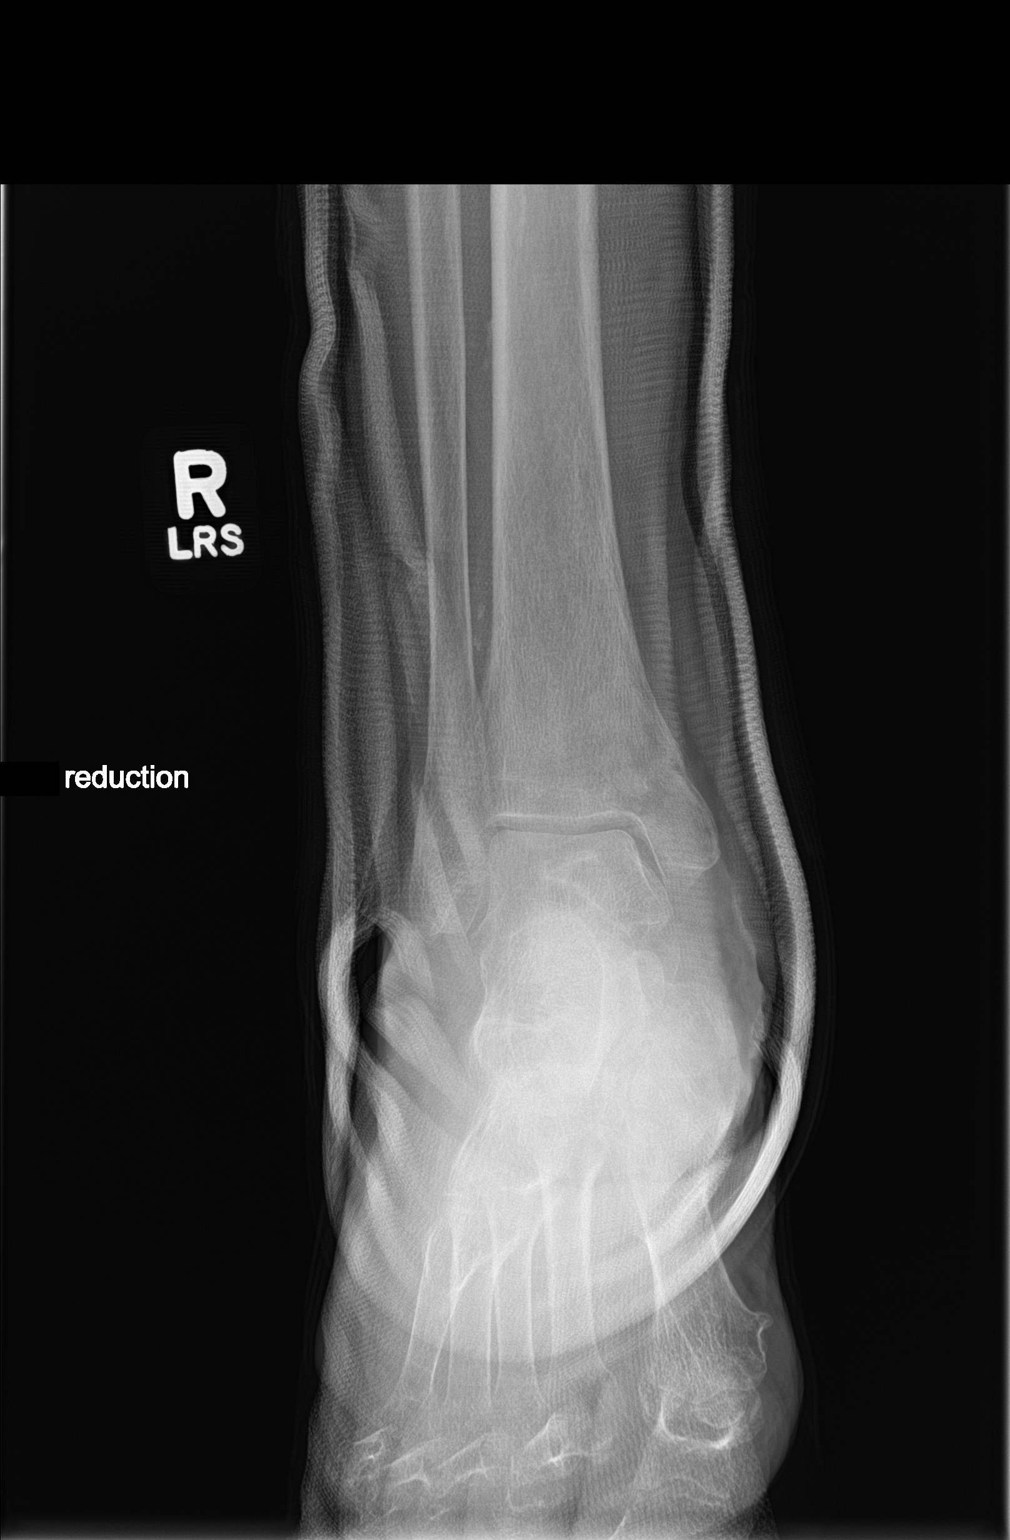

[ankle lat]
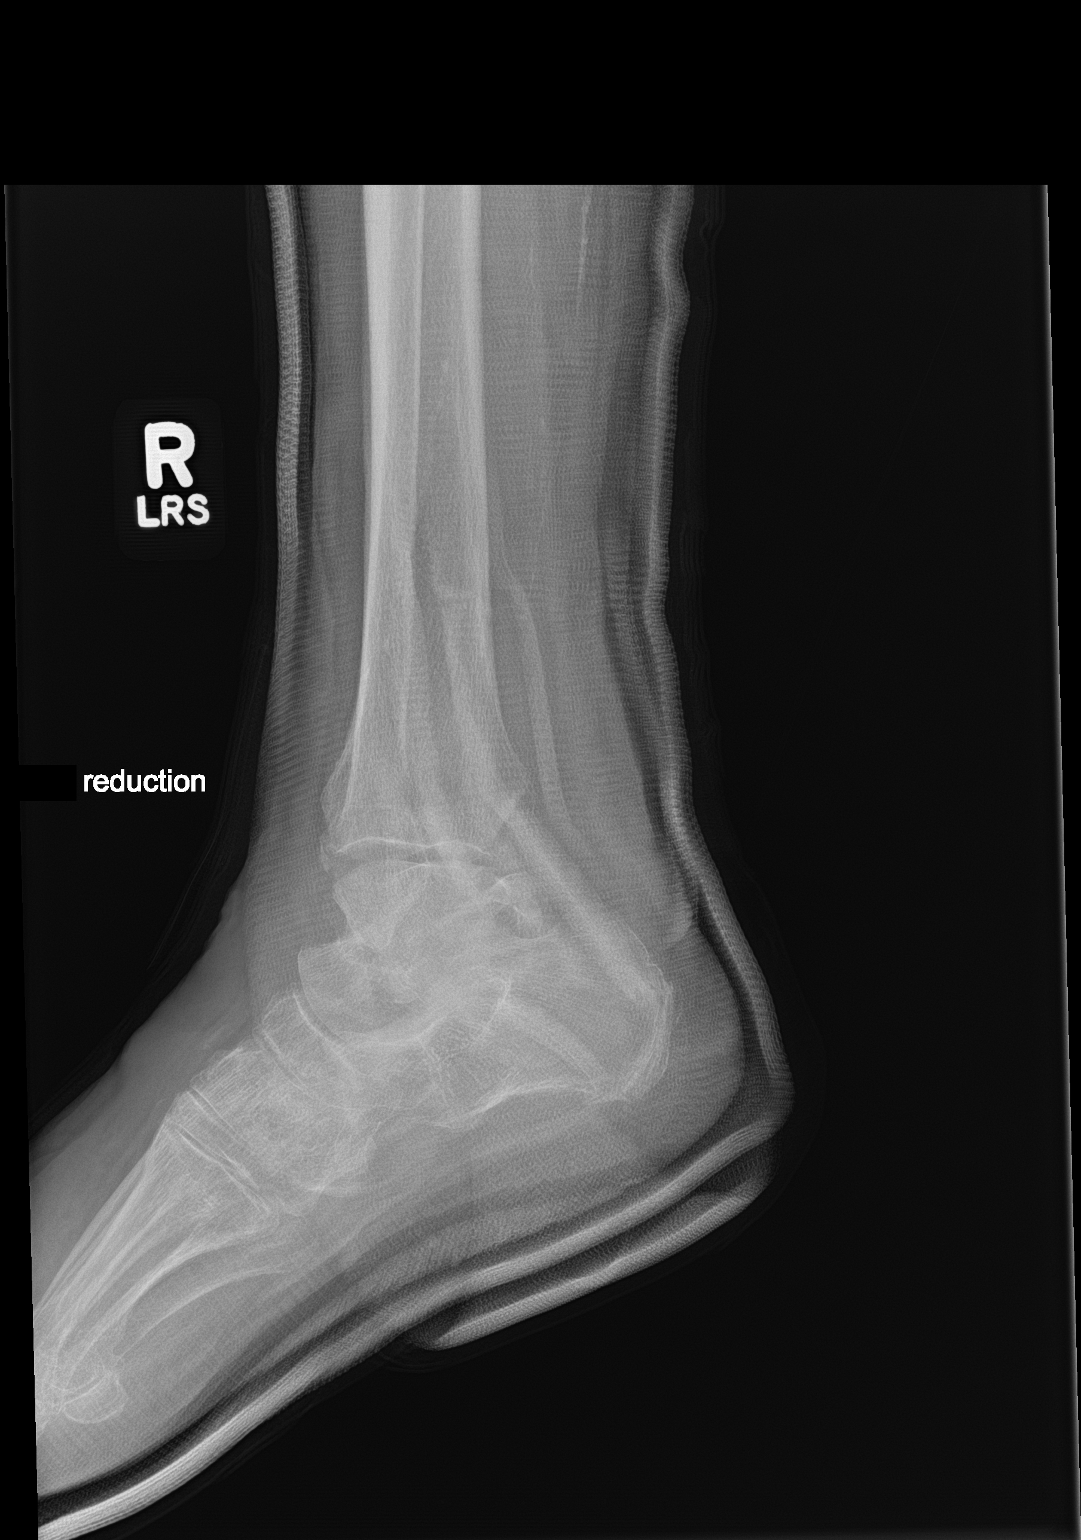

[2 of 2 positions shown; findings below may reference images not displayed]

FINDINGS: Interval reduction of trimalleolar ankle fracture with near anatomic
alignment. Fine bony details are obscured by the overlying cast.
Ankle mortise is symmetric on these nonstress views.
IMPRESSION: Interval reduction of trimalleolar fracture with near anatomic
alignment. Symmetric ankle mortise on these nonstress views. Fine
bony details obscured by overlying cast.

By: Haircraft Milon M.D.

## 2018-07-08 IMAGING — DX DG CHEST 1V
1 series · 1 of 1 positions shown · non-contrast
Comparison: 08/18/2016

CLINICAL DATA: Pain after a fall.

EXAM:
CHEST 1 VIEW

[chest ap]
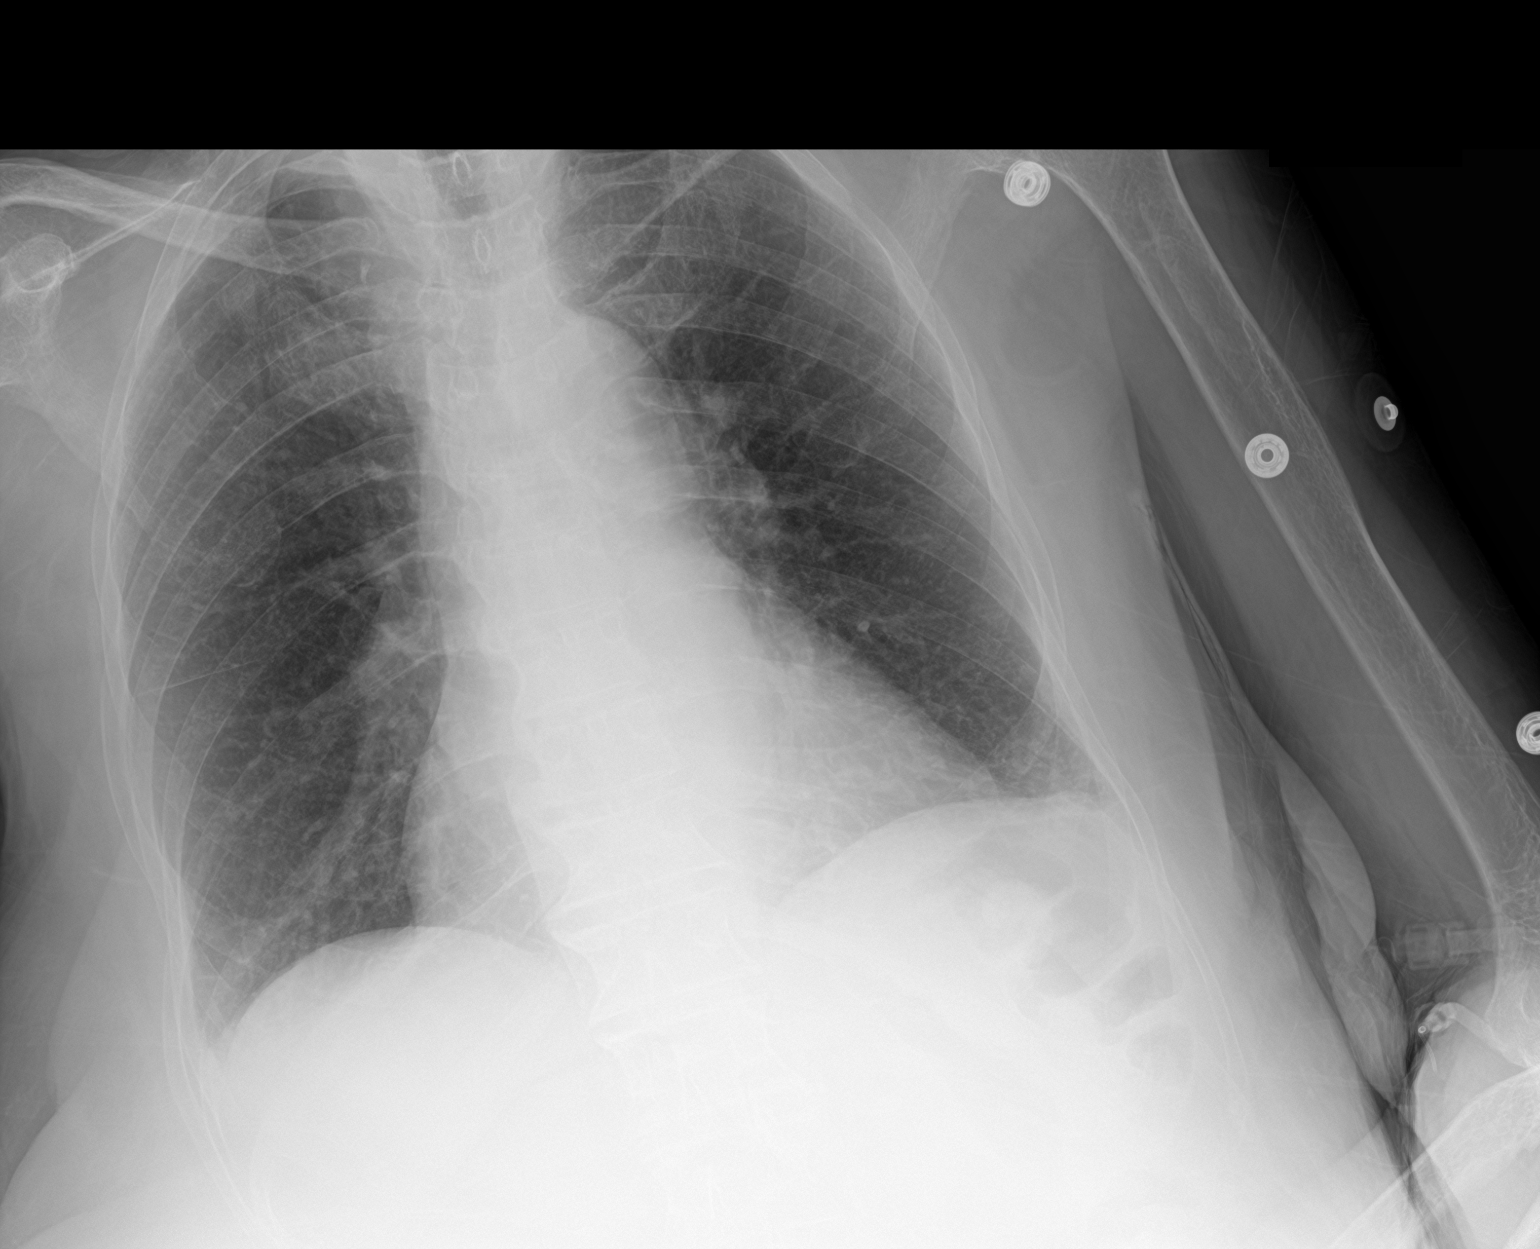

[1 of 1 positions shown; findings below may reference images not displayed]

FINDINGS: Mild cardiac enlargement. No vascular congestion. No edema or
consolidation. No blunting of costophrenic angles. No pneumothorax.
IMPRESSION: Mild cardiac enlargement.  No evidence of active pulmonary disease.

## 2018-10-12 IMAGING — DX DG CHEST 1V PORT
1 series · 1 of 1 positions shown · non-contrast
Comparison: PA and lateral chest x-ray April 08, 2017.

CLINICAL DATA: Shortness of breath, previous CVA, diabetes,
dementia.

EXAM:
PORTABLE CHEST 1 VIEW

[chest ap]
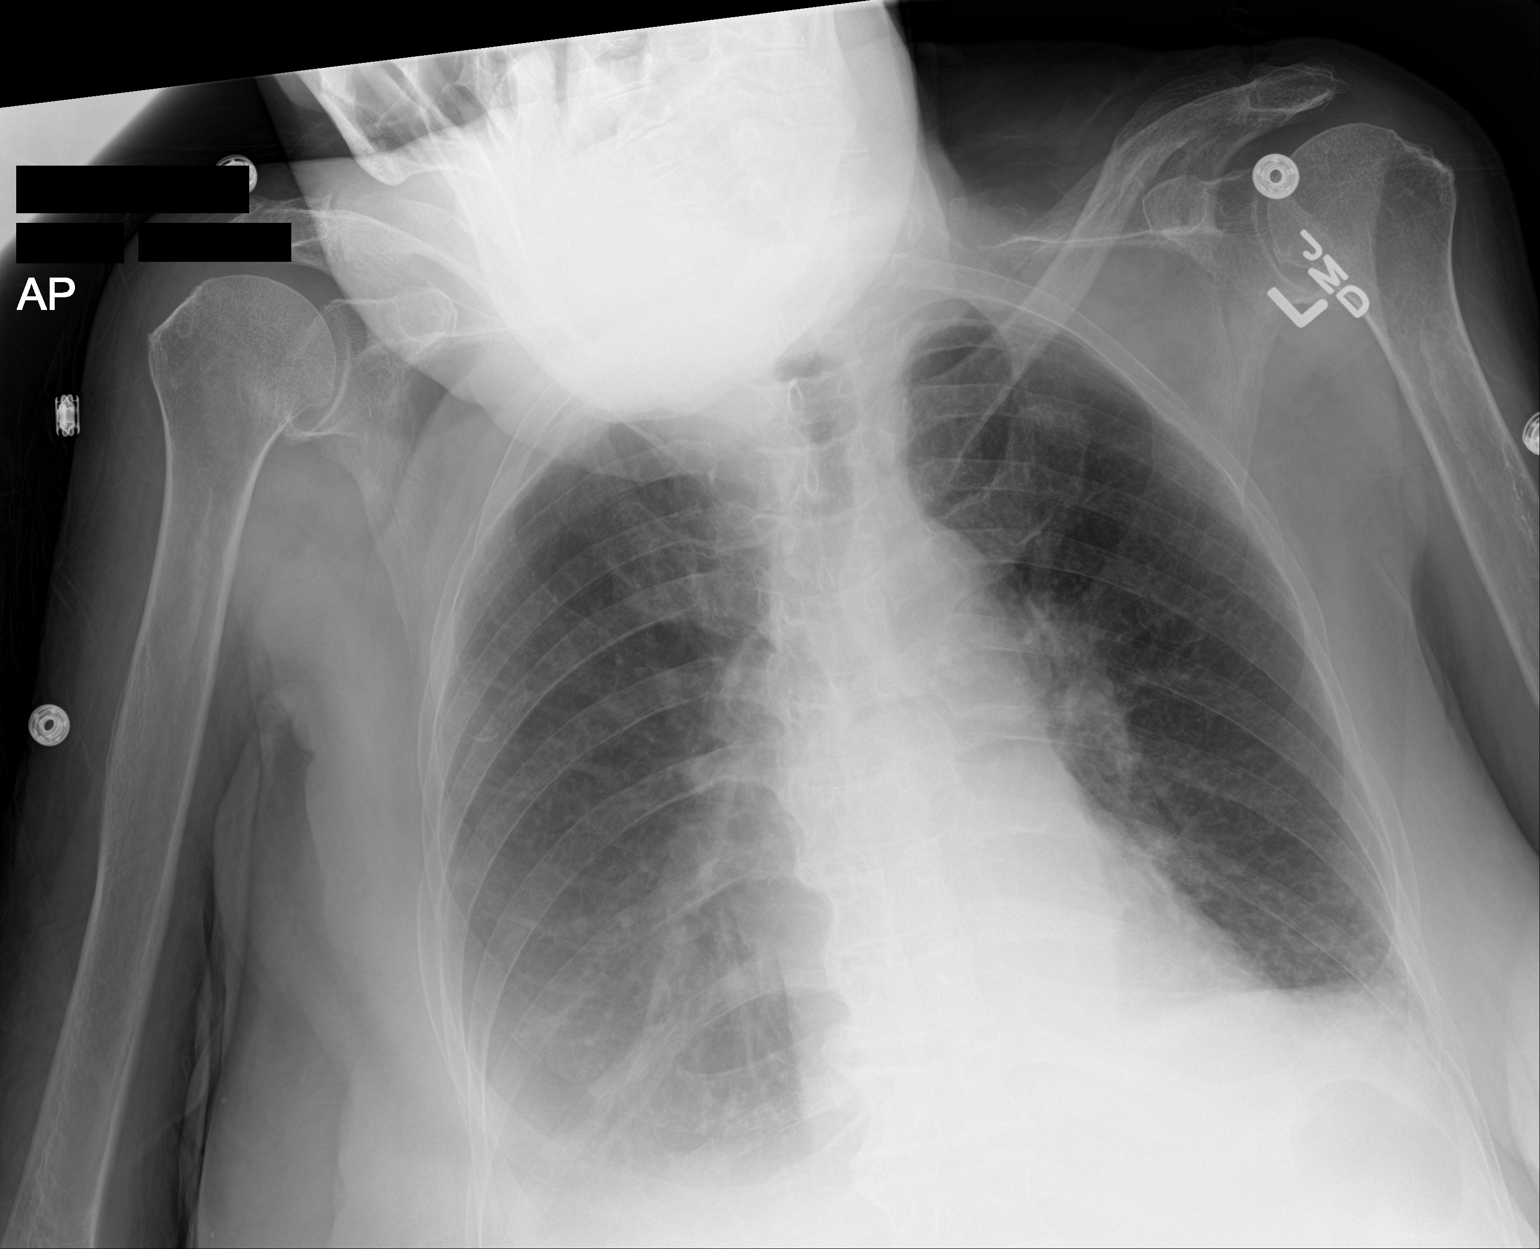

[1 of 1 positions shown; findings below may reference images not displayed]

FINDINGS: The lungs are well-expanded. There is a small right pleural effusion
and trace left pleural effusion. There is no alveolar infiltrate.
The heart and pulmonary vascularity are normal. The mediastinum is
normal in width.
IMPRESSION: Small right and trace left pleural effusion. Otherwise no active
cardiopulmonary disease.

## 2018-10-13 IMAGING — US IR PICC >5YO
1 series · 2 of 2 positions shown · IV contrast (agent unspecified)
Comparison: none

INDICATION: Poor venous access. In need of intravenous access for antibiotic
administration.

EXAM:
ULTRASOUND AND FLUOROSCOPIC GUIDED PICC LINE INSERTION
MEDICATIONS:
None.
CONTRAST:  None
FLUOROSCOPY TIME:  54 seconds (6 mGy)
COMPLICATIONS:
None immediate.
TECHNIQUE: The procedure, risks, benefits, and alternatives were explained to
the patient's family and informed written consent was obtained. A
timeout was performed prior to the initiation of the procedure.

[Series 1: ir fluoro/shunt/fist · 2 of 2 slices shown]
[im 1/2]
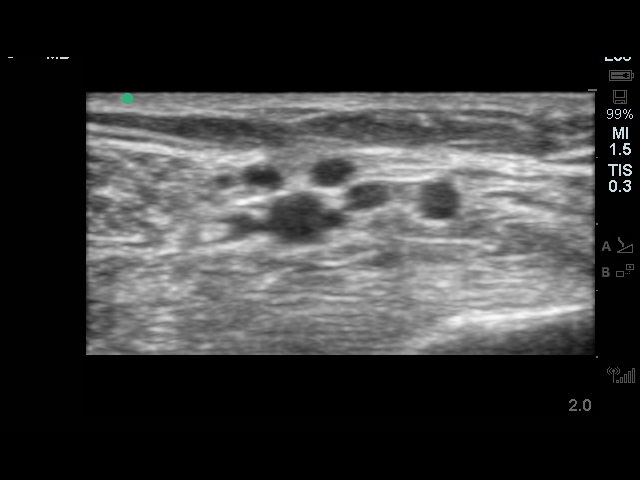
[im 2/2]
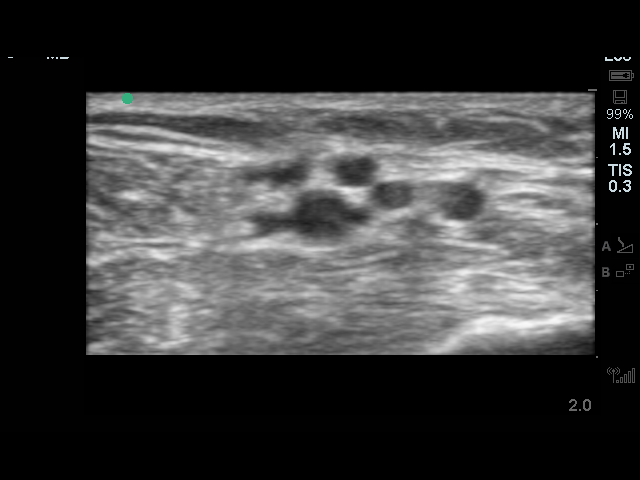

[2 of 2 positions shown; findings below may reference images not displayed]

The right upper extremity was prepped with chlorhexidine in a
sterile fashion, and a sterile drape was applied covering the
operative field. Maximum barrier sterile technique with sterile
gowns and gloves were used for the procedure. A timeout was
performed prior to the initiation of the procedure. Local anesthesia
was provided with 1% lidocaine.

Under direct ultrasound guidance, the basilic vein was accessed with
a micropuncture kit after the overlying soft tissues were
anesthetized with 1% lidocaine.

After the overlying soft tissues were anesthetized, a small venotomy
incision was created and a micropuncture kit was utilized to access
the right basilic vein. Real-time ultrasound guidance was utilized
for vascular access including the acquisition of a permanent
ultrasound image documenting patency of the accessed vessel.

A guidewire was advanced to the level of the superior caval-atrial
junction for measurement purposes and the PICC line was cut to
length. A peel-away sheath was placed and a 33 cm, 5 French, dual
lumen was inserted to level of the superior caval-atrial junction. A
post procedure spot fluoroscopic was obtained. The catheter easily
aspirated and flushed and was sutured in place. A dressing was
placed. The patient tolerated the procedure well without immediate
post procedural complication.
FINDINGS: After catheter placement, the tip lies within the superior
cavoatrial junction. The catheter aspirates and flushes normally and
is ready for immediate use.
IMPRESSION: Successful ultrasound and fluoroscopic guided placement of a right
basilic vein approach, 33 cm, 5 French, dual lumen PICC with tip at
the superior caval-atrial junction. The PICC line is ready for
immediate use.
